# Patient Record
Sex: Male | Born: 1945
Health system: Southern US, Community
[De-identification: ages and names within clinical notes are randomized; demographics above are authoritative.]

## PROBLEM LIST (undated history)

## (undated) ENCOUNTER — Emergency Department (HOSPITAL_COMMUNITY): Payer: Medicare HMO

## (undated) DIAGNOSIS — T7840XA Allergy, unspecified, initial encounter: Secondary | ICD-10-CM

## (undated) DIAGNOSIS — I639 Cerebral infarction, unspecified: Secondary | ICD-10-CM

## (undated) DIAGNOSIS — W19XXXA Unspecified fall, initial encounter: Secondary | ICD-10-CM

## (undated) DIAGNOSIS — I10 Essential (primary) hypertension: Secondary | ICD-10-CM

## (undated) DIAGNOSIS — Z8673 Personal history of transient ischemic attack (TIA), and cerebral infarction without residual deficits: Secondary | ICD-10-CM

## (undated) HISTORY — DX: Allergy, unspecified, initial encounter: T78.40XA

## (undated) HISTORY — PX: OTHER SURGICAL HISTORY: SHX169

## (undated) HISTORY — DX: Cerebral infarction, unspecified: I63.9

---

## 2003-10-02 ENCOUNTER — Other Ambulatory Visit: Payer: Self-pay

## 2006-06-01 ENCOUNTER — Emergency Department: Payer: Self-pay | Admitting: Emergency Medicine

## 2006-06-01 ENCOUNTER — Other Ambulatory Visit: Payer: Self-pay

## 2009-01-17 ENCOUNTER — Emergency Department: Payer: Self-pay

## 2016-10-09 ENCOUNTER — Ambulatory Visit: Payer: Self-pay | Admitting: Urology

## 2016-10-09 NOTE — Progress Notes (Deleted)
10/09/2016 10:13 AM   Joyce CopaWillie Schlee [birth year] [id number]  Referring provider: Domenic Schwabheryl Paulette Lindley, FNP [address] [place], [place] [id number]  No chief complaint on file.   HPI: Patient is a 71 year old *** male who is referred by Domenic Schwabheryl Paulette Lindley, FNP for an elevated PSA.  Patient was found to have a PSA of 11.3 ng/mL on 09/17/2016 during a routine physical exam.    BPH WITH LUTS His IPSS score today is ***, which is *** lower urinary tract symptomatology. He is *** with his quality life due to his urinary symptoms. His PVR is *** mL.  His previous IPSS score was ***.  His previous PVR is *** mL.    His major complaint today ***.  He has had these symptoms for *** years.  He denies any dysuria, hematuria or suprapubic pain.   He currently taking ***.  His has had ***.  Previous PSA's:     He also denies any recent fevers, chills, nausea or vomiting.  He has a family history of PCa, with ***.   He does not have a family history of PCa.***    Score:  1-7 Mild 8-19 Moderate 20-35 Severe   Erectile dysfunction His SHIM score is ***, which is ***.   His previous SHIM score was ***.  He has been having difficulty with erections for ***.   His major complaint is ***.  His libido is ***.   His risk factors for ED are age, pelvic radiation, BPH, prostate cancer, stroke, Parkinson's disease, MS, hypogonadism, spinal injury, brain injury, DM, HTN, HLD, hypothyroidism, sleep apnea, CAD, stress, night shift work, anxiety, depression, alcohol abuse, smoking, antidepressants, pain medication and blood pressure medications. ***  He denies any painful erections or curvatures with his erections.   He has tried *** in the past.        Score: 1-7 Severe ED 8-11 Moderate ED 12-16 Mild-Moderate ED 17-21 Mild ED 22-25 No ED      PMH: No past medical history on file.  Surgical History: No past surgical history on file.  Home Medications:  Allergies as of  10/09/2016   Not on File     Medication List    as of 10/09/2016 10:13 AM   You have not been prescribed any medications.     Allergies: Allergies not on file  Family History: No family history on file.  Social History:  has no tobacco, alcohol, and drug history on file.  ROS:                                        Physical Exam: There were no vitals taken for this visit.  Constitutional: Well nourished. Alert and oriented, No acute distress. HEENT: [place] AT, moist mucus membranes. Trachea midline, no masses. Cardiovascular: No clubbing, cyanosis, or edema. Respiratory: Normal respiratory effort, no increased work of breathing. GI: Abdomen is soft, non tender, non distended, no abdominal masses. Liver and spleen not palpable.  No hernias appreciated.  Stool sample for occult testing is not indicated.   GU: No CVA tenderness.  No bladder fullness or masses.  Patient with circumcised/uncircumcised phallus. ***Foreskin easily retracted***  Urethral meatus is patent.  No penile discharge. No penile lesions or rashes. Scrotum without lesions, cysts, rashes and/or edema.  Testicles are located scrotally bilaterally. No masses are appreciated in the testicles. Left and right  epididymis are normal. Rectal: Patient with  normal sphincter tone. Anus and perineum without scarring or rashes. No rectal masses are appreciated. Prostate is approximately *** grams, *** nodules are appreciated. Seminal vesicles are normal. Skin: No rashes, bruises or suspicious lesions. Lymph: No cervical or inguinal adenopathy. Neurologic: Grossly intact, no focal deficits, moving all 4 extremities. Psychiatric: Normal mood and affect.  Laboratory Data: No results found for: WBC, HGB, HCT, MCV, PLT  No results found for: CREATININE  No results found for: PSA  No results found for: TESTOSTERONE  No results found for: HGBA1C  No results found for: TSH  No results found for: CHOL,  HDL, CHOLHDL, VLDL, LDLCALC  No results found for: AST No results found for: ALT No components found for: ALKALINEPHOPHATASE No components found for: BILIRUBINTOTAL  No results found for: ESTRADIOL   Urinalysis No results found for: COLORURINE, APPEARANCEUR, LABSPEC, PHURINE, GLUCOSEU, HGBUR, BILIRUBINUR, KETONESUR, PROTEINUR, UROBILINOGEN, NITRITE, LEUKOCYTESUR  Pertinent Imaging: ***  Assessment & Plan:  ***  1. Elevated PSA  - I discussed with the patient that PSA is an acronym for  prostate specific antigen,  which is a protein made by the prostate gland and can be detected in the blood stream. I explained to the patient situations that would increase the PSA, such as: a man's age,  BPH, infection, recent intercourse/ejaculation, prostate infarction, recent urethroscopic manipulation (Foley placement/cystoscopy) and prostate cancer.   - At this time, I have advised the patient that we will repeat the PSA to rule out lab error.  If that should return elevated, we could continue observation or pursue a prostate biopsy.   - We discussed that indications for prostate biopsy are defined by age and race specific PSA cutoffs as well as a PSA velocity of 0.75/year.  - We reviewed the implications of an elevated PSA and the uncertainty surrounding it. In general, a man's PSA increases with age and is produced by both normal and cancerous prostate tissue. Differential for elevated PSA is BPH, prostate cancer, infection, recent intercourse/ejaculation, prostate infarction, recent urethroscopic manipulation (foley placement/cystoscopy) and prostatitis. Management of an elevated PSA can include observation or prostate biopsy and we discussed this in detail.  No Follow-up on file.  These notes generated with voice recognition software. I apologize for typographical errors.  Michiel Cowboy, PA-C  [facility] [address] [place], [place] [address] [phone]

## 2017-11-20 ENCOUNTER — Inpatient Hospital Stay (HOSPITAL_COMMUNITY)
Admission: EM | Admit: 2017-11-20 | Discharge: 2017-11-22 | DRG: 069 | Disposition: A | Discharge status: Home Health | Payer: Medicare Other | Attending: Internal Medicine | Admitting: Internal Medicine

## 2017-11-20 ENCOUNTER — Encounter (HOSPITAL_COMMUNITY): Payer: Self-pay | Admitting: *Deleted

## 2017-11-20 ENCOUNTER — Emergency Department (HOSPITAL_COMMUNITY): Payer: Medicare Other

## 2017-11-20 ENCOUNTER — Other Ambulatory Visit: Payer: Self-pay

## 2017-11-20 DIAGNOSIS — E785 Hyperlipidemia, unspecified: Secondary | ICD-10-CM | POA: Diagnosis present

## 2017-11-20 DIAGNOSIS — I6789 Other cerebrovascular disease: Secondary | ICD-10-CM | POA: Diagnosis not present

## 2017-11-20 DIAGNOSIS — R531 Weakness: Secondary | ICD-10-CM | POA: Diagnosis not present

## 2017-11-20 DIAGNOSIS — E162 Hypoglycemia, unspecified: Secondary | ICD-10-CM | POA: Diagnosis present

## 2017-11-20 DIAGNOSIS — I160 Hypertensive urgency: Secondary | ICD-10-CM | POA: Diagnosis not present

## 2017-11-20 DIAGNOSIS — R9431 Abnormal electrocardiogram [ECG] [EKG]: Secondary | ICD-10-CM | POA: Diagnosis present

## 2017-11-20 DIAGNOSIS — F17210 Nicotine dependence, cigarettes, uncomplicated: Secondary | ICD-10-CM | POA: Diagnosis present

## 2017-11-20 DIAGNOSIS — Z8673 Personal history of transient ischemic attack (TIA), and cerebral infarction without residual deficits: Secondary | ICD-10-CM

## 2017-11-20 DIAGNOSIS — R29700 NIHSS score 0: Secondary | ICD-10-CM | POA: Diagnosis present

## 2017-11-20 DIAGNOSIS — Z7982 Long term (current) use of aspirin: Secondary | ICD-10-CM

## 2017-11-20 DIAGNOSIS — Z8249 Family history of ischemic heart disease and other diseases of the circulatory system: Secondary | ICD-10-CM | POA: Diagnosis not present

## 2017-11-20 DIAGNOSIS — I639 Cerebral infarction, unspecified: Secondary | ICD-10-CM

## 2017-11-20 DIAGNOSIS — G459 Transient cerebral ischemic attack, unspecified: Principal | ICD-10-CM

## 2017-11-20 DIAGNOSIS — I10 Essential (primary) hypertension: Secondary | ICD-10-CM | POA: Diagnosis present

## 2017-11-20 DIAGNOSIS — R4182 Altered mental status, unspecified: Secondary | ICD-10-CM | POA: Diagnosis not present

## 2017-11-20 DIAGNOSIS — G8191 Hemiplegia, unspecified affecting right dominant side: Secondary | ICD-10-CM | POA: Diagnosis present

## 2017-11-20 HISTORY — DX: Personal history of transient ischemic attack (TIA), and cerebral infarction without residual deficits: Z86.73

## 2017-11-20 HISTORY — DX: Essential (primary) hypertension: I10

## 2017-11-20 LAB — I-STAT CG4 LACTIC ACID, ED
Lactic Acid, Venous: 1.75 mmol/L (ref 0.5–1.9)
Lactic Acid, Venous: 0.9 mmol/L (ref 0.5–1.9)

## 2017-11-20 LAB — CBC
HCT: 46.8 % (ref 39.0–52.0)
HEMOGLOBIN: 16 g/dL (ref 13.0–17.0)
MCH: 31.1 pg (ref 26.0–34.0)
MCHC: 34.2 g/dL (ref 30.0–36.0)
MCV: 91.1 fL (ref 78.0–100.0)
Platelets: 143 10*3/uL — ABNORMAL LOW (ref 150–400)
RBC: 5.14 MIL/uL (ref 4.22–5.81)
RDW: 14.8 % (ref 11.5–15.5)
WBC: 7.1 10*3/uL (ref 4.0–10.5)

## 2017-11-20 LAB — URINALYSIS, ROUTINE W REFLEX MICROSCOPIC
Bilirubin Urine: NEGATIVE
GLUCOSE, UA: NEGATIVE mg/dL
HGB URINE DIPSTICK: NEGATIVE
Ketones, ur: NEGATIVE mg/dL
LEUKOCYTES UA: NEGATIVE
Nitrite: NEGATIVE
Protein, ur: NEGATIVE mg/dL
SPECIFIC GRAVITY, URINE: 1.013 (ref 1.005–1.030)
pH: 7 (ref 5.0–8.0)

## 2017-11-20 LAB — COMPREHENSIVE METABOLIC PANEL
ALBUMIN: 3.7 g/dL (ref 3.5–5.0)
ALT: 12 U/L — ABNORMAL LOW (ref 17–63)
AST: 17 U/L (ref 15–41)
Alkaline Phosphatase: 86 U/L (ref 38–126)
Anion gap: 12 (ref 5–15)
BUN: 9 mg/dL (ref 6–20)
CHLORIDE: 105 mmol/L (ref 101–111)
CO2: 21 mmol/L — AB (ref 22–32)
Calcium: 8.9 mg/dL (ref 8.9–10.3)
Creatinine, Ser: 1.18 mg/dL (ref 0.61–1.24)
GFR calc Af Amer: >60 mL/min (ref >60)
GFR calc non Af Amer: >60 mL/min (ref >60)
GLUCOSE: 99 mg/dL (ref 65–99)
POTASSIUM: 3.5 mmol/L (ref 3.5–5.1)
SODIUM: 138 mmol/L (ref 135–145)
Total Bilirubin: 0.8 mg/dL (ref 0.3–1.2)
Total Protein: 6.9 g/dL (ref 6.5–8.1)

## 2017-11-20 LAB — CBG MONITORING, ED: Glucose-Capillary: 81 mg/dL (ref 65–99)

## 2017-11-20 LAB — RAPID URINE DRUG SCREEN, HOSP PERFORMED
AMPHETAMINES: NOT DETECTED
BENZODIAZEPINES: NOT DETECTED
Barbiturates: NOT DETECTED
Cocaine: NOT DETECTED
OPIATES: NOT DETECTED
TETRAHYDROCANNABINOL: NOT DETECTED

## 2017-11-20 LAB — I-STAT TROPONIN, ED: TROPONIN I, POC: 0.01 ng/mL (ref 0.00–0.08)

## 2017-11-20 MED ORDER — HYDRALAZINE HCL 20 MG/ML IJ SOLN
10.0000 mg | Freq: Once | INTRAMUSCULAR | Status: AC
  Administered 2017-11-20: 10 mg via INTRAVENOUS
  Filled 2017-11-20: qty 1

## 2017-11-20 NOTE — ED Notes (Signed)
No complaints of pain anywhere

## 2017-11-20 NOTE — ED Provider Notes (Signed)
[facility] Provider Note   CSN: [id number] Arrival date & time: 11/20/17  1920     History   Chief Complaint Chief Complaint  Patient presents with  . Altered Mental Status    HPI Austin Weaver is a 72 y.o. male.  Patient is a 72 year old male presenting with left upper extremity weakness and AMS.  PMH significant for HTN otherwise unremarkable per patient and chart review.  Onset earlier this evening when patient was trying to sit up from chair, he felt weak especially in his left arm and was unable to sit up.  Patient was at his cousin's house who called EMS because he appeared to be weak and having difficulty.  Patient states he did have left arm weakness but denies left leg weakness or facial droop.  Patient moving all 4 extremities by the time EMS arrived without signs of facial droop.  Was noted to have hypoglycemia of 55 and was given glucose with return to 88.  Patient currently on lisinopril-HCTZ but has been not adherent to his medication for the past several months due to diuretic side effect.  He is a never smoker, but denies EtOH use or illicit drugs.  He currently lives at home alone, drives, and does not need assistance with ambulation or ADLs.      Past Medical History:  Diagnosis Date  . Hypertension     Patient Active Problem List   Diagnosis Date Noted  . Hypertensive urgency 11/21/2017  . TIA (transient ischemic attack) 11/20/2017    Past Surgical History:  Procedure Laterality Date  . laporotomy         Home Medications    Prior to Admission medications   Not on File    Family History Family History  Problem Relation Age of Onset  . Hypertension Mother     Social History Social History   Tobacco Use  . Smoking status: Current Every Day Smoker  . Smokeless tobacco: Never Used  Substance Use Topics  . Alcohol use: No    Frequency: Never  . Drug use: Not on file     Allergies   Patient has no  known allergies.   Review of Systems Review of Systems  Constitutional: Negative for appetite change, chills, diaphoresis and fever.  HENT: Negative for congestion, ear pain, rhinorrhea and sore throat.   Eyes: Negative for pain and visual disturbance.  Respiratory: Negative for cough and shortness of breath.   Cardiovascular: Negative for chest pain, palpitations and leg swelling.  Gastrointestinal: Negative for abdominal pain, blood in stool, diarrhea, nausea and vomiting.  Genitourinary: Negative for decreased urine volume, dysuria, frequency and hematuria.  Musculoskeletal: Negative for arthralgias, back pain and gait problem.  Skin: Negative for color change and rash.  Neurological: Positive for weakness. Negative for dizziness, seizures, syncope, facial asymmetry, speech difficulty, light-headedness and headaches.  All other systems reviewed and are negative.    Physical Exam Updated Vital Signs BP (!) 177/99   Pulse 60   Temp (!) 97.5 F (36.4 C) (Oral)   Resp 18   Ht 5\' 6"  (1.676 m)   Wt 55.3 kg (122 lb)   SpO2 98%   BMI 19.69 kg/m   Physical Exam  Constitutional: He is oriented to person, place, and time. He appears well-developed and well-nourished. No distress.  HENT:  Head: Normocephalic and atraumatic.  Dry mucous membranes  Eyes: Conjunctivae and EOM are normal. Pupils are equal, round, and reactive to light.  Neck: Neck supple.  Cardiovascular: Normal rate and regular rhythm. Exam reveals no gallop and no friction rub.  No murmur heard. Pulmonary/Chest: Effort normal and breath sounds normal. No respiratory distress. He has no wheezes. He has no rales.  Abdominal: Soft. Bowel sounds are normal. There is no tenderness.  Musculoskeletal: He exhibits no edema.  5/5 motor strength in grip strength bilaterally  Lymphadenopathy:    He has no cervical adenopathy.  Neurological: He is alert and oriented to person, place, and time. No cranial nerve deficit or  sensory deficit. Coordination normal.  Skin: Skin is warm and dry. He is not diaphoretic.  Psychiatric: He has a normal mood and affect. His behavior is normal. Judgment and thought content normal.  Nursing note and vitals reviewed.    ED Treatments / Results  Labs (all labs ordered are listed, but only abnormal results are displayed) Labs Reviewed  COMPREHENSIVE METABOLIC PANEL - Abnormal; Notable for the following components:      Result Value   CO2 21 (*)    ALT 12 (*)    All other components within normal limits  CBC - Abnormal; Notable for the following components:   Platelets 143 (*)    All other components within normal limits  LIPID PANEL - Abnormal; Notable for the following components:   LDL Cholesterol 125 (*)    All other components within normal limits  CBC - Abnormal; Notable for the following components:   Platelets 143 (*)    All other components within normal limits  URINALYSIS, ROUTINE W REFLEX MICROSCOPIC  RAPID URINE DRUG SCREEN, HOSP PERFORMED  ETHANOL  HEMOGLOBIN A1C  CREATININE, SERUM  TROPONIN I  TROPONIN I  TROPONIN I  CBG MONITORING, ED  I-STAT CG4 LACTIC ACID, ED  I-STAT TROPONIN, ED  I-STAT CG4 LACTIC ACID, ED  CBG MONITORING, ED    EKG  EKG Interpretation  Date/Time:  Friday November 20 2017 19:39:46 EST Ventricular Rate:  75 PR Interval:    QRS Duration: 82 QT Interval:  386 QTC Calculation: 432 R Axis:   27 Text Interpretation:  Sinus rhythm Ventricular premature complex Abnormal T, consider ischemia, diffuse leads Minimal ST elevation, anterior leads No significant change since last tracing Confirmed by Gwyneth Sprout ([id number]) on 11/20/2017 7:43:16 PM       Radiology Ct Angio Head W Or Wo Contrast  Result Date: 11/21/2017 CLINICAL DATA:  LEFT-sided weakness, follow-up stroke. History of hypertension. EXAM: CT ANGIOGRAPHY HEAD AND NECK TECHNIQUE: Multidetector CT imaging of the head and neck was performed using the standard  protocol during bolus administration of intravenous contrast. Multiplanar CT image reconstructions and MIPs were obtained to evaluate the vascular anatomy. Carotid stenosis measurements (when applicable) are obtained utilizing NASCET criteria, using the distal internal carotid diameter as the denominator. CONTRAST:  50mL ISOVUE-370 IOPAMIDOL (ISOVUE-370) INJECTION 76% COMPARISON:  CT HEAD November 20, 2017 FINDINGS: CTA NECK FINDINGS: AORTIC ARCH: Normal appearance of the thoracic arch, 2 vessel arch is a normal variant. Mild calcific atherosclerosis aortic arch. The origins of the innominate, left Common carotid artery and subclavian artery are widely patent. RIGHT CAROTID SYSTEM: Common carotid artery is widely patent, mild calcific atherosclerosis. Trace calcific atherosclerosis carotid bifurcation without hemodynamically significant stenosis by NASCET criteria. Normal appearance of the internal carotid artery. LEFT CAROTID SYSTEM: Common carotid artery is widely patent, tortuous lateral course with moderate calcific atherosclerosis. Trace calcific atherosclerosis carotid bifurcation without hemodynamically significant stenosis by NASCET criteria. Normal appearance of the internal carotid artery. VERTEBRAL  ARTERIES:Left vertebral artery is dominant, multiple folds LEFT V1 segment. Diminutive irregular RIGHT vertebral artery with severe stenosis RIGHT distal V3 segment. SKELETON: No acute osseous process though bone windows have not been submitted. Patient is edentulous. Moderate to severe cervical spondylosis. OTHER NECK: Soft tissues of the neck are nonacute though, not tailored for evaluation. UPPER CHEST: Included lung apices are clear. Mild centrilobular emphysema. No superior mediastinal lymphadenopathy. CTA HEAD FINDINGS: ANTERIOR CIRCULATION: Patent cervical internal carotid arteries, petrous, cavernous and supra clinoid internal carotid arteries. Patent anterior communicating artery. Patent anterior and  middle cerebral arteries. No large vessel occlusion, significant stenosis, contrast extravasation or aneurysm. POSTERIOR CIRCULATION: Occluded RIGHT V4 segment. LEFT vertebral artery is patent with severe stenosis distally due to calcific atherosclerosis. Heavy calcific atherosclerosis basilar artery with long segment severe stenosis, possible occlusion. RIGHT posterior-inferior cerebellar artery not distinctly identified. Bilateral posterior communicating arteries present. Moderate stenosis RIGHT P2 segment. No large vessel occlusion, significant stenosis, contrast extravasation or aneurysm. VENOUS SINUSES: Major dural venous sinuses are patent though not tailored for evaluation on this angiographic examination. ANATOMIC VARIANTS: None. DELAYED PHASE: No abnormal intracranial enhancement. MIP images reviewed. IMPRESSION: CTA NECK: 1. No hemodynamically significant stenosis internal carotid artery's. 2. 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 CTA HEAD: 1. Occluded RIGHT intradural vertebral artery. Severe stenosis LEFT vertebral artery and basilar artery due to heavy calcific atherosclerosis with possible tandem occlusion. 2. Complete circle bullous. No anterior circulation emergent large vessel occlusion or flow limiting stenosis. Aortic Atherosclerosis (ICD10-I70.0) and Emphysema (ICD10-J43.9). Electronically Signed   By: Awilda Metro M.D.   On: 11/21/2017 05:05   Dg Chest 2 View  Result Date: 11/21/2017 CLINICAL DATA:  Stroke. EXAM: CHEST  2 VIEW COMPARISON:  None. FINDINGS: The cardiomediastinal contours are normal. Heart is upper normal in size. Atherosclerosis of thoracic aorta. Coronary artery calcification versus stent. Mild interstitial coarsening. Pulmonary vasculature is normal. No consolidation, pleural effusion, or pneumothorax. No acute osseous abnormalities are seen. IMPRESSION: 1. Borderline cardiomegaly with aortic atherosclerosis.  Coronary artery calcification versus stent. 2. Mild interstitial coarsening, likely chronic, however no prior exams for comparison. Electronically Signed   By: Rubye Oaks M.D.   On: 11/21/2017 02:50   Ct Head Wo Contrast  Result Date: 11/20/2017 CLINICAL DATA:  Hypoglycemia, left-sided weakness and altered mental status. EXAM: CT HEAD WITHOUT CONTRAST TECHNIQUE: Contiguous axial images were obtained from the base of the skull through the vertex without intravenous contrast. COMPARISON:  None. FINDINGS: Brain: No acute hemorrhage. No midline shift or other mass effect. Age-indeterminate infarct of the right external capsule. Advanced atrophy. There is periventricular hypoattenuation compatible with chronic microvascular disease.Asymmetric enlargement of the right hemisphere is likely congenital. Vascular: Extensive calcification of the vertebral and internal carotid arteries at the skull base. Calcification extends to the MCA origins. The right middle cerebral artery is mildly hyperdense, particularly of the M2 segment. Skull: Normal visualized skull base, calvarium and extracranial soft tissues. Sinuses/Orbits: No sinus fluid levels or advanced mucosal thickening. No mastoid effusion. Normal orbits. IMPRESSION: 1. No acute hemorrhage. 2. Age indeterminate right external capsule infarct. Given the volume loss within the right frontal lobe, this is more likely to be chronic. Hyperdense appearance of the right MCA is probably due to atherosclerotic disease, though thrombus could have the same appearance. 3. Chronic ischemic microangiopathy and severe calcific atherosclerosis. Electronically Signed   By: Deatra Robinson M.D.   On: 11/20/2017 22:38   Ct Angio Neck W Or Wo  Contrast  Result Date: 11/21/2017 CLINICAL DATA:  LEFT-sided weakness, follow-up stroke. History of hypertension. EXAM: CT ANGIOGRAPHY HEAD AND NECK TECHNIQUE: Multidetector CT imaging of the head and neck was performed using the standard  protocol during bolus administration of intravenous contrast. Multiplanar CT image reconstructions and MIPs were obtained to evaluate the vascular anatomy. Carotid stenosis measurements (when applicable) are obtained utilizing NASCET criteria, using the distal internal carotid diameter as the denominator. CONTRAST:  50mL ISOVUE-370 IOPAMIDOL (ISOVUE-370) INJECTION 76% COMPARISON:  CT HEAD November 20, 2017 FINDINGS: CTA NECK FINDINGS: AORTIC ARCH: Normal appearance of the thoracic arch, 2 vessel arch is a normal variant. Mild calcific atherosclerosis aortic arch. The origins of the innominate, left Common carotid artery and subclavian artery are widely patent. RIGHT CAROTID SYSTEM: Common carotid artery is widely patent, mild calcific atherosclerosis. Trace calcific atherosclerosis carotid bifurcation without hemodynamically significant stenosis by NASCET criteria. Normal appearance of the internal carotid artery. LEFT CAROTID SYSTEM: Common carotid artery is widely patent, tortuous lateral course with moderate calcific atherosclerosis. Trace calcific atherosclerosis carotid bifurcation without hemodynamically significant stenosis by NASCET criteria. Normal appearance of the internal carotid artery. VERTEBRAL ARTERIES:Left vertebral artery is dominant, multiple folds LEFT V1 segment. Diminutive irregular RIGHT vertebral artery with severe stenosis RIGHT distal V3 segment. SKELETON: No acute osseous process though bone windows have not been submitted. Patient is edentulous. Moderate to severe cervical spondylosis. OTHER NECK: Soft tissues of the neck are nonacute though, not tailored for evaluation. UPPER CHEST: Included lung apices are clear. Mild centrilobular emphysema. No superior mediastinal lymphadenopathy. CTA HEAD FINDINGS: ANTERIOR CIRCULATION: Patent cervical internal carotid arteries, petrous, cavernous and supra clinoid internal carotid arteries. Patent anterior communicating artery. Patent anterior and  middle cerebral arteries. No large vessel occlusion, significant stenosis, contrast extravasation or aneurysm. POSTERIOR CIRCULATION: Occluded RIGHT V4 segment. LEFT vertebral artery is patent with severe stenosis distally due to calcific atherosclerosis. Heavy calcific atherosclerosis basilar artery with long segment severe stenosis, possible occlusion. RIGHT posterior-inferior cerebellar artery not distinctly identified. Bilateral posterior communicating arteries present. Moderate stenosis RIGHT P2 segment. No large vessel occlusion, significant stenosis, contrast extravasation or aneurysm. VENOUS SINUSES: Major dural venous sinuses are patent though not tailored for evaluation on this angiographic examination. ANATOMIC VARIANTS: None. DELAYED PHASE: No abnormal intracranial enhancement. MIP images reviewed. IMPRESSION: CTA NECK: 1. No hemodynamically significant stenosis internal carotid artery's. 2. 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 CTA HEAD: 1. Occluded RIGHT intradural vertebral artery. Severe stenosis LEFT vertebral artery and basilar artery due to heavy calcific atherosclerosis with possible tandem occlusion. 2. Complete circle bullous. No anterior circulation emergent large vessel occlusion or flow limiting stenosis. Aortic Atherosclerosis (ICD10-I70.0) and Emphysema (ICD10-J43.9). Electronically Signed   By: Awilda Metro M.D.   On: 11/21/2017 05:05    Procedures Procedures (including critical care time)  Medications Ordered in ED Medications   stroke: mapping our early stages of recovery book (not administered)  0.9 %  sodium chloride infusion ( Intravenous New Bag/Given 11/21/17 0529)  acetaminophen (TYLENOL) tablet 650 mg (not administered)    Or  acetaminophen (TYLENOL) solution 650 mg (not administered)    Or  acetaminophen (TYLENOL) suppository 650 mg (not administered)  enoxaparin (LOVENOX) injection 40 mg (not  administered)  aspirin suppository 300 mg (not administered)    Or  aspirin tablet 325 mg (not administered)  hydrALAZINE (APRESOLINE) injection 10 mg (not administered)  lisinopril (PRINIVIL,ZESTRIL) tablet 20 mg (not administered)  atorvastatin (LIPITOR) tablet 80 mg (80 mg  Oral Given 11/21/17 0641)  hydrALAZINE (APRESOLINE) injection 10 mg (10 mg Intravenous Given 11/20/17 2321)  iopamidol (ISOVUE-370) 76 % injection (50 mLs  Contrast Given 11/21/17 0245)     Initial Impression / Assessment and Plan / ED Course  I have reviewed the triage vital signs and the nursing notes.  Pertinent labs & imaging results that were available during my care of the patient were reviewed by me and considered in my medical decision making (see chart for details).  Patient is a 72 year old male presenting with left upper extremity weakness and AMS.  PMH significant for HTN otherwise unremarkable per patient and chart review.  Vitals afebrile, HR 72 and hypertensive 205/104, RR 16 at 99% room air.  EKG NSR without new T wave changes or ST changes compared to prior EKG. patient noted to have hypoglycemia 55.  EMS and return to 81 upon arrival following 1 amp of dextrose.  Patient states he skipped lunch today because he was busy usually has 3 meals a day.  He did have breakfast including eggs, toast, improved.  Patient's neighbor and cousin arriving to room to witness the event.  State patient had signs of left-sided facial droop.  Patient also recalling difficulty using the left lower extremity.  This was an issue in the past which spontaneously resolved over the last couple weeks.  We will proceed with head CT without contrast.  CT head without contrast without acute hemorrhage the findings consistent with indeterminate right external capsule infarct likely to be chronic with hyperdense appearance of right MCA likely due to arthrosclerotic disease and chronic ischemic microangiopathic severe calcific  atherosclerosis.  Given recent symptoms concerning for TIA and history of ischemic stroke of undetermined age, will admit for stroke workup.  Final Clinical Impressions(s) / ED Diagnoses   Final diagnoses:  Weakness  TIA (transient ischemic attack)    ED Discharge Orders    None       Wendee Beavers, DO 11/21/17 [id number]    Gwyneth Sprout, MD 11/21/17 1556

## 2017-11-20 NOTE — ED Notes (Signed)
Patient denies pain and is resting comfortably.  

## 2017-11-20 NOTE — ED Notes (Signed)
The pt has not had his bp med for months

## 2017-11-20 NOTE — ED Provider Notes (Deleted)
I saw and evaluated the patient, reviewed the resident's note and I agree with the findings and plan.   EKG Interpretation  Date/Time:  Friday November 20 2017 19:39:46 EST Ventricular Rate:  75 PR Interval:    QRS Duration: 82 QT Interval:  386 QTC Calculation: 432 R Axis:   27 Text Interpretation:  Sinus rhythm Ventricular premature complex Abnormal T, consider ischemia, diffuse leads Minimal ST elevation, anterior leads No significant change since last tracing Confirmed by Gwyneth SproutPlunkett, Rocsi Hazelbaker ([id number]) on 11/20/2017 7:43:16 PM      TIA (transient ischemic attack)  Weakness  Patient is a 72 year old male with a history of hypertension and poor compliance with his blood pressure medications presenting today with left-sided deficits that have resolved upon arrival.  Patient states he was at his cousin's house when he tried to stand up in his left leg and arm would not work.  Also family present at the scene states his face was twisted.  Patient thinks his symptoms lasted between 25 and 30 minutes and then he was able to get up and walk but his arm weakness lingered.  Upon arrival to the hospital his symptoms have resolved.  He denies headache, abdominal pain, nausea or vomiting.  Patient had not eaten lunch and when EMS arrived blood sugar was 54 however it is remained in the 80s and he does not have a history of diabetes.  Patient denies drug or alcohol use.  On exam patient has 5 out of 5 strength in bilateral upper extremities, no facial droop, lower extremities with 5 out of 5 strength.  Speech without evidence of aphasia.  Heart is regular rate and rhythm and lungs are clear to auscultation bilaterally.  Patient CT is consistent with age-indeterminate right external capsular infarct and hyperdense appearance of the right MCA is probably due to atherosclerotic disease.  Patient has been persistently hypertensive here however with TIA or possible stroke will admit for further workup.   Gwyneth SproutPlunkett,  Argus Caraher, MD 11/21/17 [phone]

## 2017-11-20 NOTE — ED Triage Notes (Signed)
The pt arrived by gems from home  Family called oout because the pt  Had lt sided wweakness  cbg was checked and was found to have a glucose  Of 56  He was given oral glucose and his cbg went up to 88.  On arrival the pt is alertt and oriented skin warm and d ry moves all 4 wxtremities and no facial droop

## 2017-11-21 ENCOUNTER — Observation Stay (HOSPITAL_COMMUNITY): Payer: Medicare Other

## 2017-11-21 ENCOUNTER — Encounter (HOSPITAL_COMMUNITY): Payer: Self-pay | Admitting: Internal Medicine

## 2017-11-21 DIAGNOSIS — G459 Transient cerebral ischemic attack, unspecified: Secondary | ICD-10-CM | POA: Diagnosis not present

## 2017-11-21 DIAGNOSIS — I639 Cerebral infarction, unspecified: Secondary | ICD-10-CM

## 2017-11-21 DIAGNOSIS — E162 Hypoglycemia, unspecified: Secondary | ICD-10-CM | POA: Diagnosis present

## 2017-11-21 DIAGNOSIS — E785 Hyperlipidemia, unspecified: Secondary | ICD-10-CM | POA: Diagnosis present

## 2017-11-21 DIAGNOSIS — R29700 NIHSS score 0: Secondary | ICD-10-CM | POA: Diagnosis present

## 2017-11-21 DIAGNOSIS — I160 Hypertensive urgency: Secondary | ICD-10-CM | POA: Diagnosis present

## 2017-11-21 DIAGNOSIS — G8191 Hemiplegia, unspecified affecting right dominant side: Secondary | ICD-10-CM | POA: Diagnosis present

## 2017-11-21 DIAGNOSIS — F17210 Nicotine dependence, cigarettes, uncomplicated: Secondary | ICD-10-CM | POA: Diagnosis present

## 2017-11-21 DIAGNOSIS — I10 Essential (primary) hypertension: Secondary | ICD-10-CM | POA: Diagnosis present

## 2017-11-21 DIAGNOSIS — R531 Weakness: Secondary | ICD-10-CM | POA: Diagnosis not present

## 2017-11-21 DIAGNOSIS — Z7982 Long term (current) use of aspirin: Secondary | ICD-10-CM | POA: Diagnosis not present

## 2017-11-21 DIAGNOSIS — Z8249 Family history of ischemic heart disease and other diseases of the circulatory system: Secondary | ICD-10-CM | POA: Diagnosis not present

## 2017-11-21 DIAGNOSIS — R9431 Abnormal electrocardiogram [ECG] [EKG]: Secondary | ICD-10-CM | POA: Diagnosis present

## 2017-11-21 LAB — HEMOGLOBIN A1C
Hgb A1c MFr Bld: 5.6 % (ref 4.8–5.6)
Mean Plasma Glucose: 114.02 mg/dL

## 2017-11-21 LAB — CBC
HCT: 48.6 % (ref 39.0–52.0)
Hemoglobin: 16.8 g/dL (ref 13.0–17.0)
MCH: 31.4 pg (ref 26.0–34.0)
MCHC: 34.6 g/dL (ref 30.0–36.0)
MCV: 90.8 fL (ref 78.0–100.0)
PLATELETS: 143 10*3/uL — AB (ref 150–400)
RBC: 5.35 MIL/uL (ref 4.22–5.81)
RDW: 14.8 % (ref 11.5–15.5)
WBC: 7.9 10*3/uL (ref 4.0–10.5)

## 2017-11-21 LAB — TROPONIN I
Troponin I: <0.03 ng/mL (ref <0.03)
Troponin I: <0.03 ng/mL (ref <0.03)

## 2017-11-21 LAB — CREATININE, SERUM
CREATININE: 1.05 mg/dL (ref 0.61–1.24)
GFR calc Af Amer: >60 mL/min (ref >60)
GFR calc non Af Amer: >60 mL/min (ref >60)

## 2017-11-21 LAB — LIPID PANEL
CHOL/HDL RATIO: 3.5 ratio
CHOLESTEROL: 194 mg/dL (ref 0–200)
HDL: 55 mg/dL (ref >40)
LDL CALC: 125 mg/dL — AB (ref 0–99)
TRIGLYCERIDES: 69 mg/dL (ref <150)
VLDL: 14 mg/dL (ref 0–40)

## 2017-11-21 LAB — ETHANOL

## 2017-11-21 LAB — CBG MONITORING, ED: Glucose-Capillary: 94 mg/dL (ref 65–99)

## 2017-11-21 MED ORDER — IOPAMIDOL (ISOVUE-370) INJECTION 76%
INTRAVENOUS | Status: AC
  Administered 2017-11-21: 50 mL
  Filled 2017-11-21: qty 50

## 2017-11-21 MED ORDER — ACETAMINOPHEN 325 MG PO TABS: 650.0000 mg | ORAL_TABLET | ORAL | Status: DC | PRN

## 2017-11-21 MED ORDER — STROKE: EARLY STAGES OF RECOVERY BOOK
Freq: Once | Status: AC
  Administered 2017-11-21: 11:00:00
  Filled 2017-11-21 (×2): qty 1

## 2017-11-21 MED ORDER — ACETAMINOPHEN 160 MG/5ML PO SOLN: 650.0000 mg | ORAL | Status: DC | PRN

## 2017-11-21 MED ORDER — ATORVASTATIN CALCIUM 80 MG PO TABS
80.0000 mg | ORAL_TABLET | Freq: Every day | ORAL | Status: DC
  Administered 2017-11-21: 80 mg via ORAL
  Filled 2017-11-21: qty 1

## 2017-11-21 MED ORDER — ASPIRIN 325 MG PO TABS
325.0000 mg | ORAL_TABLET | Freq: Every day | ORAL | Status: DC
  Administered 2017-11-21 – 2017-11-22 (×2): 325 mg via ORAL
  Filled 2017-11-21 (×2): qty 1

## 2017-11-21 MED ORDER — ASPIRIN 300 MG RE SUPP: 300.0000 mg | Freq: Every day | RECTAL | Status: DC

## 2017-11-21 MED ORDER — LISINOPRIL 10 MG PO TABS
20.0000 mg | ORAL_TABLET | Freq: Every day | ORAL | Status: DC
  Administered 2017-11-21 – 2017-11-22 (×2): 20 mg via ORAL
  Filled 2017-11-21 (×2): qty 2

## 2017-11-21 MED ORDER — SODIUM CHLORIDE 0.9 % IV SOLN
INTRAVENOUS | Status: DC
  Administered 2017-11-21: 05:00:00 via INTRAVENOUS

## 2017-11-21 MED ORDER — ACETAMINOPHEN 650 MG RE SUPP: 650.0000 mg | RECTAL | Status: DC | PRN

## 2017-11-21 MED ORDER — HYDRALAZINE HCL 20 MG/ML IJ SOLN: 10.0000 mg | INTRAMUSCULAR | Status: DC | PRN

## 2017-11-21 MED ORDER — ENOXAPARIN SODIUM 40 MG/0.4ML ~~LOC~~ SOLN
40.0000 mg | SUBCUTANEOUS | Status: DC
  Administered 2017-11-21 – 2017-11-22 (×2): 40 mg via SUBCUTANEOUS
  Filled 2017-11-21 (×3): qty 0.4

## 2017-11-21 NOTE — ED Notes (Signed)
hospitalist at the bedside 

## 2017-11-21 NOTE — Progress Notes (Signed)
[facility] PLAN OF CARE NOTE Patient: Austin CopaWillie Carbone ZOX:[id number]   PCP: Patient, No Pcp Per DOB: December 13, 1945   DOA: 11/20/2017   DOS: 11/21/2017    Patient was admitted by my colleague Dr. Toniann FailKakrakandy earlier on 11/21/2017. I have reviewed the H&P as well as assessment and plan and agree with the same. Important changes in the plan are listed below.  Plan of care: Active Problems:   TIA (transient ischemic attack)   Hypertensive urgency    Author: Lynden OxfordPranav Lycan Davee, MD [facility] Pager: [phone] 11/21/2017 1:10 PM   If 7PM-7AM, please contact night-coverage at [email or web address], password [email or web address]

## 2017-11-21 NOTE — ED Notes (Signed)
No distress.

## 2017-11-21 NOTE — Consult Note (Signed)
Requesting Physician: Dr. Anitra LauthPlunkett    Chief Complaint: Transient R side weakness  History obtained from: Patient and Chart   HPI:                                                                                                                                       Austin Weaver is an 72 y.o. male the past medical history of hypertension,smoker  presents to the ER after having difficulty getting up due to right-sided weakness that lasted for 30 minutes.  She states that he was watching TV around 5 PM he noticed that he could not get up. He states his right-side ( intially stated left side)  was weak and then after about 30 minutes to get up however still felt weaker on that side. EMS arrived and patient noted to be hypoglycemic to 55 and moved to 81 after 1 amp of dextrose. His blood pressure was also noted to be 205/104. Symptoms are completely resolved by the time he arrived to the emergency room.  Date last known well: 2.15.19 Time last known well: 5pm tPA Given: resolved symptoms NIHSS: 0 Baseline MRS 0   Past Medical History:  Diagnosis Date  . Hypertension     Past Surgical History:  Procedure Laterality Date  . laporotomy      Family History  Problem Relation Age of Onset  . Hypertension Mother    Social History:  reports that he has been smoking.  he has never used smokeless tobacco. He reports that he does not drink alcohol. His drug history is not on file.  Allergies: No Known Allergies  Medications:                                                                                                                        I reviewed home medications   ROS:  14 systems reviewed and negative except above    Examination:                                                                                                      General: Appears  well-developed and well-nourished.  Psych: Affect appropriate to situation Eyes: No scleral injection HENT: No OP obstrucion Head: Normocephalic.  Cardiovascular: Normal rate and regular rhythm.  Respiratory: Effort normal and breath sounds normal to anterior ascultation GI: Soft.  No distension. There is no tenderness.  Skin: WDI   Neurological Examination Mental Status: Alert, oriented, thought content appropriate.  Speech: Stuttering speech. Able to follow 3 step commands without difficulty. Cranial Nerves: II: Visual fields grossly normal,  III,IV, VI: ptosis not present, extra-ocular motions intact bilaterally, pupils equal, round, reactive to light and accommodation V,VII: smile symmetric, facial light touch sensation normal bilaterally VIII: hearing normal bilaterally IX,X: uvula rises symmetrically XI: bilateral shoulder shrug XII: midline tongue extension Motor: Right : Upper extremity   5/5    Left:     Upper extremity   5/5  Lower extremity   5/5     Lower extremity   5/5 Tone and bulk:normal tone throughout; no atrophy noted Sensory: Pinprick and light touch intact throughout, bilaterally Deep Tendon Reflexes: 2+ and symmetric throughout Plantars: Right: downgoing   Left: downgoing Cerebellar: normal finger-to-nose, normal rapid alternating movements and normal heel-to-shin test Gait: normal gait and station     Lab Results: Basic Metabolic Panel: Recent Labs  Lab 11/20/17 1948  NA 138  K 3.5  CL 105  CO2 21*  GLUCOSE 99  BUN 9  CREATININE 1.18  CALCIUM 8.9    CBC: Recent Labs  Lab 11/20/17 1948  WBC 7.1  HGB 16.0  HCT 46.8  MCV 91.1  PLT 143*    Coagulation Studies: No results for input(s): LABPROT, INR in the last 72 hours.  Imaging: Dg Chest 2 View  Result Date: 11/21/2017 CLINICAL DATA:  Stroke. EXAM: CHEST  2 VIEW COMPARISON:  None. FINDINGS: The cardiomediastinal contours are normal. Heart is upper normal in size. Atherosclerosis  of thoracic aorta. Coronary artery calcification versus stent. Mild interstitial coarsening. Pulmonary vasculature is normal. No consolidation, pleural effusion, or pneumothorax. No acute osseous abnormalities are seen. IMPRESSION: 1. Borderline cardiomegaly with aortic atherosclerosis. Coronary artery calcification versus stent. 2. Mild interstitial coarsening, likely chronic, however no prior exams for comparison. Electronically Signed   By: Rubye Oaks M.D.   On: 11/21/2017 02:50   Ct Head Wo Contrast  Result Date: 11/20/2017 CLINICAL DATA:  Hypoglycemia, left-sided weakness and altered mental status. EXAM: CT HEAD WITHOUT CONTRAST TECHNIQUE: Contiguous axial images were obtained from the base of the skull through the vertex without intravenous contrast. COMPARISON:  None. FINDINGS: Brain: No acute hemorrhage. No midline shift or other mass effect. Age-indeterminate infarct of the right external capsule. Advanced atrophy. There is periventricular hypoattenuation compatible with chronic microvascular disease.Asymmetric enlargement of the right hemisphere is likely congenital. Vascular: Extensive calcification of the vertebral and internal carotid arteries at the skull base. Calcification extends  to the MCA origins. The right middle cerebral artery is mildly hyperdense, particularly of the M2 segment. Skull: Normal visualized skull base, calvarium and extracranial soft tissues. Sinuses/Orbits: No sinus fluid levels or advanced mucosal thickening. No mastoid effusion. Normal orbits. IMPRESSION: 1. No acute hemorrhage. 2. Age indeterminate right external capsule infarct. Given the volume loss within the right frontal lobe, this is more likely to be chronic. Hyperdense appearance of the right MCA is probably due to atherosclerotic disease, though thrombus could have the same appearance. 3. Chronic ischemic microangiopathy and severe calcific atherosclerosis. Electronically Signed   By: Deatra Robinson M.D.    On: 11/20/2017 22:38     ASSESSMENT AND PLAN  72 y.o. male the past medical history of hypertension and tobacco abuse presents to the ER after having difficulty getting up due to right-sided weakness that lasted for 30 minutes. Symptoms resolved by the time he arrived to the emergency room. EMS noticed patient to be hypoglycemic as well as severely hypertensive. CT head was negative for any acute infarct, CT angiogram showed near intracranial atherosclerosis immediately posterior circulation as well as Occluded RIGHT V4 segment. LEFT vertebralartery is patent with severe stenosis distally due to calcific atherosclerosis.  Transient ischemic attack/Possible AIS Intracranial atherosclerotic disease Occluded right vertebral artery     Recommend # MRI of the brain without contrast #MRA Head and neck  #Transthoracic Echo  # Start patient on ASA 325mg  daily, consider starting patient on DAPT x84months for ICAD ( final decision by stroke team)  #Start or continue Atorvastatin 80 mg/other high intensity statin  # BP goal: permissive HTN upto 220 systolic, PRNs above 220 systolic  # HBAIC and Lipid profile # Telemetry monitoring # Frequent neuro checks # NPO until passes stroke swallow screen  Please page stroke NP  Or  PA  Or MD from 8am -4 pm  as this patient from this time will be  followed by the stroke.   You can look them up on [email or web address]  Password [facility]    Sushanth Aroor [facility] Pager Number [phone]
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
## 2017-11-21 NOTE — ED Notes (Signed)
Pt brought back from c-t  Reporting that the iv rt a-c had infiltrated  iov  Removed  Iv team notified

## 2017-11-21 NOTE — Progress Notes (Signed)
SLP Cancellation Note  Patient Details Name: Austin Weaver MRN: [id number] DOB: 03/20/1946   Cancelled treatment:       Reason Eval/Treat Not Completed: SLP screened, no needs identified, will sign off. Per charting, pt passed RN stroke swallow screen 11/20/17 at 11:09 PM, therefore SLP swallow evaluation not warranted unless concerns for dysphagia. D/w RN; diet orders per MD.   Rondel BatonMary Beth Sebastion Jun, MS, CCC-SLP Speech-Language Pathologist [phone]  Arlana LindauMary E Ikia Cincotta 11/21/2017, 1:37 PM

## 2017-11-21 NOTE — ED Notes (Signed)
Pt back to ct

## 2017-11-21 NOTE — ED Notes (Signed)
He cannot remember the year

## 2017-11-21 NOTE — ED Notes (Signed)
Report given to sharon on 6e

## 2017-11-21 NOTE — Evaluation (Signed)
Physical Therapy Evaluation Patient Details Name: Austin Weaver MRN: [id number] DOB: Feb 28, 1946 Today's Date: 11/21/2017   History of Present Illness  Pt is a 72 y/o male admitted secondary to acute onset of R sided weakness that lasted ~30 minutes. Head CT was negative for any acute findings. PMH including but not limited to HTN.    Clinical Impression  Pt presented supine in bed with HOB elevated, awake and willing to participate in therapy session. Prior to admission, pt reported that he was independent with all functional mobility and ADLs. Pt reported that he continues to drive. Pt lives alone but stated that he was planning to stay with his cousin for a few days upon d/c. Pt currently requires min guard for transfers and min guard for ambulation without use of an AD. Pt would continue to benefit from skilled physical therapy services at this time while admitted and after d/c to address the below listed limitations in order to improve overall safety and independence with functional mobility.  BP supine at beginning of session = 172/91 BP sitting = 180/109 BP supine at end of session = 187/91    Follow Up Recommendations Home health PT;Supervision/Assistance - 24 hour    Equipment Recommendations  None recommended by PT    Recommendations for Other Services       Precautions / Restrictions Precautions Precautions: Fall Restrictions Weight Bearing Restrictions: No      Mobility  Bed Mobility Overal bed mobility: Modified Independent             General bed mobility comments: increased time and effort  Transfers Overall transfer level: Needs assistance Equipment used: None Transfers: Sit to/from Stand Sit to Stand: Min guard         General transfer comment: with first sit<>stand, pt with LOB posteriorly and unable to correct and had to sit back down on bed. second attempt was successful, no physical assistance needed, min guard for  safety  Ambulation/Gait Ambulation/Gait assistance: Min guard Ambulation Distance (Feet): 50 Feet Assistive device: None Gait Pattern/deviations: Step-through pattern;Decreased step length - right;Decreased step length - left;Decreased stride length;Narrow base of support;Drifts right/left Gait velocity: decreased Gait velocity interpretation: Below normal speed for age/gender General Gait Details: mild instability with one minor LOB laterally that he was able to self-correct  Stairs            Wheelchair Mobility    Modified Rankin (Stroke Patients Only)       Balance Overall balance assessment: Needs assistance Sitting-balance support: Feet supported Sitting balance-Leahy Scale: Good     Standing balance support: No upper extremity supported;During functional activity Standing balance-Leahy Scale: Fair                               Pertinent Vitals/Pain Pain Assessment: No/denies pain    Home Living Family/patient expects to be discharged to:: Private residence Living Arrangements: Alone Available Help at Discharge: Family;Friend(s);Available PRN/intermittently   Home Access: Stairs to enter   Entrance Stairs-Number of Steps: 1 Home Layout: One level Home Equipment: Cane - single point;Walker - 2 wheels Additional Comments: Pt plans to go stay with his cousin (who can provide 24/7 supervision/assistance) for "a few days" after d/c    Prior Function Level of Independence: Independent               Hand Dominance        Extremity/Trunk Assessment   Upper Extremity Assessment Upper  Extremity Assessment: Defer to OT evaluation    Lower Extremity Assessment Lower Extremity Assessment: Overall WFL for tasks assessed(did not perform MMT secondary to high BP (180's systolic))    Cervical / Trunk Assessment Cervical / Trunk Assessment: Normal  Communication   Communication: Other (comment)(pt with intermittent stutter)  Cognition  Arousal/Alertness: Awake/alert Behavior During Therapy: WFL for tasks assessed/performed Overall Cognitive Status: No family/caregiver present to determine baseline cognitive functioning Area of Impairment: Orientation;Memory;Following commands;Awareness;Problem solving                 Orientation Level: Disoriented to;Time   Memory: Decreased short-term memory Following Commands: Follows one step commands consistently;Follows one step commands with increased time   Awareness: Intellectual Problem Solving: Difficulty sequencing;Requires verbal cues        General Comments      Exercises     Assessment/Plan    PT Assessment Patient needs continued PT services  PT Problem List Decreased balance;Decreased mobility;Decreased coordination;Decreased cognition;Decreased safety awareness       PT Treatment Interventions DME instruction;Gait training;Functional mobility training;Therapeutic exercise;Stair training;Therapeutic activities;Balance training;Neuromuscular re-education;Patient/family education    PT Goals (Current goals can be found in the Care Plan section)  Acute Rehab PT Goals Patient Stated Goal: to eat PT Goal Formulation: With patient Time For Goal Achievement: 12/05/17 Potential to Achieve Goals: Good    Frequency Min 3X/week   Barriers to discharge Decreased caregiver support      Co-evaluation               AM-PAC PT "6 Clicks" Daily Activity  Outcome Measure Difficulty turning over in bed (including adjusting bedclothes, sheets and blankets)?: None Difficulty moving from lying on back to sitting on the side of the bed? : A Little Difficulty sitting down on and standing up from a chair with arms (e.g., wheelchair, bedside commode, etc,.)?: Unable Help needed moving to and from a bed to chair (including a wheelchair)?: A Little Help needed walking in hospital room?: A Little Help needed climbing 3-5 steps with a railing? : A Little 6 Click  Score: 17    End of Session   Activity Tolerance: Patient tolerated treatment well;Other (comment) Patient left: in bed;with call bell/phone within reach Nurse Communication: Mobility status;Other (comment)(BP high throughout (low 180's for systolic)) PT Visit Diagnosis: Other abnormalities of gait and mobility (R26.89);Difficulty in walking, not elsewhere classified (R26.2)    Time: 1610-9604 PT Time Calculation (min) (ACUTE ONLY): 23 min   Charges:   PT Evaluation $PT Eval Moderate Complexity: 1 Mod PT Treatments $Therapeutic Activity: 8-22 mins   PT G Codes:       [place] [facility], PT, DPT [phone]   Austin Weaver Austin Weaver 11/21/2017, 1:10 PM

## 2017-11-22 ENCOUNTER — Inpatient Hospital Stay (HOSPITAL_COMMUNITY): Payer: Medicare Other

## 2017-11-22 DIAGNOSIS — I160 Hypertensive urgency: Secondary | ICD-10-CM

## 2017-11-22 DIAGNOSIS — G459 Transient cerebral ischemic attack, unspecified: Principal | ICD-10-CM

## 2017-11-22 LAB — ECHOCARDIOGRAM COMPLETE
Height: 66 in
WEIGHTICAEL: 1952 [oz_av]

## 2017-11-22 MED ORDER — ATORVASTATIN CALCIUM 80 MG PO TABS: 80.0000 mg | ORAL_TABLET | Freq: Every day | ORAL | 0 refills | Status: DC

## 2017-11-22 MED ORDER — AMLODIPINE BESYLATE 5 MG PO TABS: 5.0000 mg | ORAL_TABLET | Freq: Every day | ORAL | 11 refills | Status: DC

## 2017-11-22 MED ORDER — ASPIRIN 325 MG PO TABS: 325.0000 mg | ORAL_TABLET | Freq: Every day | ORAL | 0 refills | Status: DC

## 2017-11-22 NOTE — Care Management Note (Addendum)
Case Management Note Donn PieriniKristi Lamiracle Chaidez RN, BSN Unit 4E-Case Manager [phone]  Patient Details  Name: Austin Weaver MRN: [id number] Date of Birth: 08/20/1946  Subjective/Objective:   Pt admitted with TIA                 Action/Plan: PTA pt lived at home with cousin, independent, still drives, [facility]- spoke with pt at bedside- pt is agreeable- choice offered - pt does not have a preference and [facility]- per pt contact # in epic correct however address is not he [place] [address] but does not remember house # presently. Will try to f/u prior to discharge when family present.  [facility] spoke with family member Austin Weaver- contact # [phone], confirmed address has [address], [place] [address]- she reports that pt's PCP now longer living- and they [facility]- alternate contact is Austin Weaver- [phone]- [phone]  Expected Discharge Date:  11/22/17               Expected Discharge Plan:  Home w Home Health Services  In-House Referral:     Discharge planning Services  CM Consult  Post Acute Care Choice:  Home Health Choice offered to:  Patient  DME Arranged:  N/A DME Agency:  NA  HH Arranged:  PT/OT HH Agency:  [facility]  Status of Service:  Completed, signed off  If discussed at Long Length of Stay Meetings, dates discussed:   Discharge Disposition: home/home health    Additional Comments:  Darrold SpanWebster, Allien Melberg Hall, RN 11/22/2017, 12:07 PM

## 2017-11-22 NOTE — Progress Notes (Signed)
STROKE TEAM PROGRESS NOTE   HISTORY OF PRESENT ILLNESS (per record) Austin Weaver is an 72 y.o. male the past medical history of hypertension,smoker  presents to the ER after having difficulty getting up due to right-sided weakness that lasted for 30 minutes.  She states that he was watching TV around 5 PM he noticed that he could not get up. He states his right-side ( intially stated left side)  was weak and then after about 30 minutes to get up however still felt weaker on that side. EMS arrived and patient noted to be hypoglycemic to 55 and moved to 81 after 1 amp of dextrose. His blood pressure was also noted to be 205/104. Symptoms are completely resolved by the time he arrived to the emergency room.  Date last known well: 2.15.19 Time last known well: 5pm tPA Given: resolved symptoms NIHSS: 0 Baseline MRS 0   SUBJECTIVE (INTERVAL HISTORY) His family is not at the bedside. He feels well.     OBJECTIVE Temp:  [97.8 F (36.6 C)-98.5 F (36.9 C)] 98.2 F (36.8 C) (02/17 0400) Pulse Rate:  [25-64] 58 (02/17 0800) Cardiac Rhythm: Normal sinus rhythm (02/17 0816) Resp:  [14-18] 16 (02/17 0800) BP: (129-195)/(70-101) 172/96 (02/17 0800) SpO2:  [96 %-100 %] 96 % (02/17 0800)  CBC:  Recent Labs  Lab 11/20/17 1948 11/21/17 0448  WBC 7.1 7.9  HGB 16.0 16.8  HCT 46.8 48.6  MCV 91.1 90.8  PLT 143* 143*    Basic Metabolic Panel:  Recent Labs  Lab 11/20/17 1948 11/21/17 0448  NA 138  --   K 3.5  --   CL 105  --   CO2 21*  --   GLUCOSE 99  --   BUN 9  --   CREATININE 1.18 1.05  CALCIUM 8.9  --     Lipid Panel:     Component Value Date/Time   CHOL 194 11/21/2017 0449   TRIG 69 11/21/2017 0449   HDL 55 11/21/2017 0449   CHOLHDL 3.5 11/21/2017 0449   VLDL 14 11/21/2017 0449   LDLCALC 125 (H) 11/21/2017 0449   HgbA1c:  Lab Results  Component Value Date   HGBA1C 5.6 11/21/2017   Urine Drug Screen:     Component Value Date/Time   LABOPIA NONE DETECTED  11/20/2017 1939   COCAINSCRNUR NONE DETECTED 11/20/2017 1939   LABBENZ NONE DETECTED 11/20/2017 1939   AMPHETMU NONE DETECTED 11/20/2017 1939   THCU NONE DETECTED 11/20/2017 1939   LABBARB NONE DETECTED 11/20/2017 1939    Alcohol Level     Component Value Date/Time   ETH <10 11/20/2017 2340    IMAGING   Ct Angio Head W Or Wo Contrast 11/21/2017 IMPRESSION:   CTA NECK:  1. No hemodynamically significant stenosis internal carotid artery's.  2. 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   CTA HEAD:  1. Occluded RIGHT intradural vertebral artery. Severe stenosis LEFT vertebral artery and basilar artery due to heavy calcific atherosclerosis with possible tandem occlusion.  2. Complete circle bullous. No anterior circulation emergent large vessel occlusion or flow limiting stenosis. Aortic Atherosclerosis (ICD10-I70.0) and Emphysema (ICD10-J43.9).      Dg Chest 2 View 11/21/2017 IMPRESSION:  1. Borderline cardiomegaly with aortic atherosclerosis. Coronary artery calcification versus stent.  2. Mild interstitial coarsening, likely chronic, however no prior exams for comparison.     Ct Head Wo Contrast 11/20/2017 IMPRESSION:  1. No acute hemorrhage.  2. Age indeterminate right external  capsule infarct. Given the volume loss within the right frontal lobe, this is more likely to be chronic. Hyperdense appearance of the right MCA is probably due to atherosclerotic disease, though thrombus could have the same appearance.  3. Chronic ischemic microangiopathy and severe calcific atherosclerosis.      Transthoracic Echocardiogram - pending 00/00/00       PHYSICAL EXAM Vitals:   11/22/17 0200 11/22/17 0400 11/22/17 0600 11/22/17 0800  BP: 129/74 (!) 143/79 (!) 184/101 (!) 172/96  Pulse: (!) 58 63 62 (!) 58  Resp: 16 14 16 16   Temp:  98.2 F (36.8 C)    TempSrc:  Oral    SpO2: 97% 97% 96% 96%  Weight:      Height:             HOME MEDICATIONS:  No medications prior to admission.      HOSPITAL MEDICATIONS:  . aspirin  300 mg Rectal Daily   Or  . aspirin  325 mg Oral Daily  . atorvastatin  80 mg Oral q1800  . enoxaparin (LOVENOX) injection  40 mg Subcutaneous Q24H  . lisinopril  20 mg Oral Daily   PHYSICAL EXAM Physical exam: Exam: Gen: NAD Eyes: anicteric sclerae, moist conjunctivae                    CV: no MRG, no carotid bruits, no peripheral edema Mental Status: Alert, follows commands, good historian  Neuro: Detailed Neurologic Exam  Speech:    No aphasia, no dysarthria, stuttering speech  Cranial Nerves:    The pupils are equal, round, and reactive to light.. Attempted, Fundi not visualized.  EOMI. No gaze preference. Visual fields full. Face symmetric, Tongue midline. Hearing intact to voice. Shoulder shrug intact  Motor Observation:    no involuntary movements noted. Tone appears normal.     Strength:    5/5     Sensation:  Intact to LT  Plantars downgoing.     ASSESSMENT/PLAN [name] is a 72 y.o. male with history of hypertension and tobacco use presenting with transient right-sided weakness, hypoglycemia, and severe hypertension. He did not receive IV t-PA due resolution of deficits.  Possible TIA:    Resultant - resolution of deficits  CT head -  Age indeterminate right external capsule infarct.  MRI head - not performed due to presence of foreign metallic bodies.  MRA head - not performed due to presence of foreign metallic bodies.  CTA H&N - Chronic appearing dissection RIGHT vertebral artery with severe stenosis distal RIGHT V3 segment.  Occluded RIGHT intradural vertebral artery. Severe stenosis LEFT vertebral artery and basilar artery.  Carotid Doppler - CTA neck  2D Echo - pending  LDL - 125  HgbA1c - 5.6  VTE prophylaxis - Lovenox Fall precautions Diet Heart Room service appropriate? Yes; Fluid consistency: Thin  No  antithrombotic prior to admission, now on aspirin 325 mg daily  Patient counseled to be compliant with his antithrombotic medications  Ongoing aggressive stroke risk factor management  Therapy recommendations: HH PT recommended. OT evaluation is pending.  Disposition:  Pending  Hypertension  Stable  Permissive hypertension (OK if < 220/120) but gradually normalize in 5-7 days  Long-term BP goal normotensive  Hyperlipidemia  Home meds:  No lipid lowering medications prior to admission  LDL 125, goal < 70  Now on Lipitor 80 mg daily  Continue statin at discharge    Other Stroke Risk Factors  Advanced age  Cigarette smoker -  advised to stop smoking  Hx stroke/TIA   Other Active Problems    Plan / Recommendations   Stroke workup   Hospital day # 1  Personally examined patient and images, and have participated in and made any corrections needed to history, physical, neuro exam,assessment and plan as stated above.  I have personally obtained the history, evaluated lab date, reviewed imaging studies and agree with radiology interpretations.    Naomie DeanAntonia Lionell Matuszak, MD Stroke Neurology  To contact Stroke Continuity provider, please refer to [email or web address]. After hours, contact General Neurology

## 2017-11-22 NOTE — Evaluation (Signed)
Occupational Therapy Evaluation Patient Details Name: Austin Weaver MRN: [id number] DOB: [birth year] Today's Date: 11/22/2017    History of Present Illness Pt is a 72 y/o male admitted secondary to acute onset of R sided weakness that lasted ~30 minutes. Head CT was negative for any acute findings. PMH including but not limited to HTN.   Clinical Impression   PTA, pt reports living alone and completing ADL and functional mobility independently. Pt currently requires overall min guard assist for standing ADL and ADL transfers. He additionally presents with decreased problem solving skills, decreased awareness, decreased balance, and decreased R UE coordination impacting his independence and safety with ADL and functional mobility. Additionally noted decreased ability to maintain eye position out of midline for greater than 2-3 seconds at a time. He would benefit from continued OT services while admitted to improve independence and safety with ADL and functional mobility prior to D/C home. Pt reports he will be staying with his cousin briefly post-acute D/C and recommend home health OT follow-up at his cousin's home. Will continue to follow while admitted.    Follow Up Recommendations  Home health OT;Supervision/Assistance - 24 hour(initial 24 hour assist from cousin)    Equipment Recommendations  None recommended by OT(has needs met)    Recommendations for Other Services       Precautions / Restrictions Precautions Precautions: Fall Restrictions Weight Bearing Restrictions: No      Mobility Bed Mobility Overal bed mobility: Modified Independent             General bed mobility comments: increased time and effort  Transfers Overall transfer level: Needs assistance Equipment used: None Transfers: Sit to/from Stand Sit to Stand: Min guard         General transfer comment: MIn guard assist for safety.     Balance Overall balance assessment: Needs  assistance Sitting-balance support: Feet supported Sitting balance-Leahy Scale: Good     Standing balance support: No upper extremity supported;During functional activity Standing balance-Leahy Scale: Fair                             ADL either performed or assessed with clinical judgement   ADL Overall ADL's : Needs assistance/impaired Eating/Feeding: Set up;Sitting   Grooming: Supervision/safety;Standing   Upper Body Bathing: Sitting;Supervision/ safety   Lower Body Bathing: Sit to/from stand;Min guard   Upper Body Dressing : Supervision/safety;Sitting   Lower Body Dressing: Min guard;Sit to/from stand   Toilet Transfer: Min guard;Ambulation;Regular Glass blower/designer Details (indicate cue type and reason): simulated with sit<>stand followed by functional mobility in room Toileting- Clothing Manipulation and Hygiene: Sit to/from stand;Min guard       Functional mobility during ADLs: Min guard General ADL Comments: Min guard assist during functional mobility due to decreased dynamic balance. Pt with decreased R UE gross motor coordination.      Vision Baseline Vision/History: Wears glasses Wears Glasses: Reading only Patient Visual Report: No change from baseline Vision Assessment?: Yes Eye Alignment: Within Functional Limits Ocular Range of Motion: Within Functional Limits Alignment/Gaze Preference: Within Defined Limits Tracking/Visual Pursuits: Impaired - to be further tested in functional context(only able to hold position out of midline 2 seconds) Saccades: Additional eye shifts occurred during testing Visual Fields: Other (comment)(unable to complete without extra eye movements) Additional Comments: During tracking noted inability to maintain eye position out of midline for greater than 2 seconds.      Perception     Praxis  Pertinent Vitals/Pain Pain Assessment: No/denies pain     Hand Dominance Right(responded "I guess so" when  asked)   Extremity/Trunk Assessment Upper Extremity Assessment Upper Extremity Assessment: Generalized weakness;RUE deficits/detail RUE Deficits / Details: Decreased accuracy with finger to nose testing. RUE Coordination: decreased fine motor;decreased gross motor   Lower Extremity Assessment Lower Extremity Assessment: Defer to PT evaluation       Communication Communication Communication: Other (comment)(intermittent stutter)   Cognition Arousal/Alertness: Awake/alert Behavior During Therapy: WFL for tasks assessed/performed Overall Cognitive Status: No family/caregiver present to determine baseline cognitive functioning Area of Impairment: Memory;Following commands;Awareness;Problem solving                     Memory: Decreased short-term memory Following Commands: Follows one step commands consistently;Follows one step commands with increased time   Awareness: Intellectual Problem Solving: Slow processing;Difficulty sequencing;Requires verbal cues General Comments: Difficulty understanding directions during visual testing.    General Comments       Exercises     Shoulder Instructions      Home Living Family/patient expects to be discharged to:: Private residence Living Arrangements: Alone Available Help at Discharge: Family;Friend(s);Available PRN/intermittently   Home Access: Stairs to enter Entrance Stairs-Number of Steps: 1   Home Layout: One level     Bathroom Shower/Tub: Tub/shower unit         Home Equipment: Cane - single point;Walker - 2 wheels;Shower seat   Additional Comments: Information above is from pt's home. Pt plans to go stay with his cousin (who can provide 24/7 supervision/assistance) for "a few days" after d/c. Cousin has walk-in shower with no shower seat.       Prior Functioning/Environment Level of Independence: Independent                 OT Problem List: Impaired balance (sitting and/or standing);Decreased  strength;Decreased coordination;Decreased safety awareness;Decreased knowledge of use of DME or AE;Decreased knowledge of precautions;Decreased cognition;Impaired vision/perception      OT Treatment/Interventions: Self-care/ADL training;Therapeutic exercise;Energy conservation;DME and/or AE instruction;Therapeutic activities;Cognitive remediation/compensation;Visual/perceptual remediation/compensation;Patient/family education;Balance training    OT Goals(Current goals can be found in the care plan section) Acute Rehab OT Goals Patient Stated Goal: to eat OT Goal Formulation: With patient Time For Goal Achievement: 12/06/17 Potential to Achieve Goals: Good ADL Goals Pt Will Perform Grooming: (P) with modified independence;standing Pt Will Transfer to Toilet: (P) with modified independence;ambulating;regular height toilet Pt Will Perform Toileting - Clothing Manipulation and hygiene: (P) with modified independence;sit to/from stand Pt Will Perform Tub/Shower Transfer: (P) with modified independence;ambulating;shower seat;Shower transfer Pt/caregiver will Perform Home Exercise Program: (P) Right Upper extremity;With written HEP provided;With Supervision(increased coordination)  OT Frequency: Min 2X/week   Barriers to D/C:            Co-evaluation              AM-PAC PT "6 Clicks" Daily Activity     Outcome Measure Help from another person eating meals?: A Little Help from another person taking care of personal grooming?: A Little Help from another person toileting, which includes using toliet, bedpan, or urinal?: A Little Help from another person bathing (including washing, rinsing, drying)?: A Little Help from another person to put on and taking off regular upper body clothing?: A Little Help from another person to put on and taking off regular lower body clothing?: A Little 6 Click Score: 18   End of Session Nurse Communication: Mobility status;Other (comment)(pt up in chair  for lunch after session)  Activity Tolerance: Patient tolerated treatment well Patient left: in chair;with call bell/phone within reach  OT Visit Diagnosis: Muscle weakness (generalized) (M62.81);Other symptoms and signs involving cognitive function                Time: 2202-5427 OT Time Calculation (min): 10 min Charges:  OT General Charges $OT Visit: 1 Visit OT Evaluation $OT Eval Moderate Complexity: 1 Mod G-Codes:     Norman Herrlich, MS OTR/L  Pager: [place] 11/22/2017, 12:46 PM

## 2017-11-22 NOTE — Progress Notes (Signed)
  Echocardiogram 2D Echocardiogram has been performed.  Austin Weaver T Austin Weaver 11/22/2017, 3:06 PM

## 2017-11-25 DIAGNOSIS — I10 Essential (primary) hypertension: Secondary | ICD-10-CM | POA: Diagnosis not present

## 2017-11-25 DIAGNOSIS — Z8673 Personal history of transient ischemic attack (TIA), and cerebral infarction without residual deficits: Secondary | ICD-10-CM | POA: Diagnosis not present

## 2017-11-25 DIAGNOSIS — I160 Hypertensive urgency: Secondary | ICD-10-CM | POA: Diagnosis not present

## 2017-11-25 DIAGNOSIS — Z720 Tobacco use: Secondary | ICD-10-CM | POA: Diagnosis not present

## 2017-11-26 NOTE — Discharge Summary (Addendum)
[facility] Discharge Summary   Patient: Austin Weaver ZOX:[id number]   PCP: Patient, No Pcp Per DOB: 08-25-1946   Date of admission: 11/20/2017   Date of discharge: 11/22/2017   Discharge Diagnoses:  Active Problems:   TIA (transient ischemic attack)   Hypertensive urgency   Admitted From: hoem Disposition:  Home with therapy  Recommendations for Outpatient Follow-up:  1. Follow-up with PCP in 1 week.  Follow-up Information    [facility] Follow [facility].   Specialty:  Home Health Services Why:  HHPT arranged- they will call you to set up home visits Contact information: [address] [place] [address] [phone]        PCP. Schedule an appointment as soon as possible for a visit in 1 week(s).          Diet recommendation: Cardiac diet  Activity: The patient is advised to gradually reintroduce usual activities.  Discharge Condition: good  Code Status: Full code  History of present illness: As per the H and P dictated on admission, "Austin Weaver is a 72 y.o. male with history of hypertension was not being compliant with his medications and has been at his cousin's house when he started feeling weak and more specifically weakness on the left upper extremity unable to get up from the sitting position.  This lasted for few minutes and by then EMS was called and when EMS reached home to see the patient symptoms resolved.  Patient denies any difficulty swallowing speaking or any visual symptoms.  Given the patient's symptoms concerning for stroke patient was brought to the ER.  ED Course: CT head done in the ER shows age-indeterminate right external capsule infarct and hyperintense appearance of right MCA area.  Chest x-ray also shows cardiomegaly and possible stent.  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  On my exam patient is able to move all  extremities.  No facial asymmetry tongue is midline pupils are reacting.  Patient's blood pressure is found to be markedly elevated.  Patient is being admitted for further management of possible TIA/stroke.  Discussed with neurologist."  Hospital Course:  Summary of his active problems in the hospital is as following. 1. TIA Discussed with neurology. CT head age-indeterminate right external capsule infarct. CT angiogram of the head and neck shows right vertebral artery stenosis as well as left vertebral and basilar artery stenosis. Echocardiogram shows preserved EF, no PFO no wall motion abnormality. Does not take any medication before admission, now will discharge on 325 mg aspirin as well as 80 mg Lipitor. LDL 125. Physical therapy consulted, recommend home health PT as well as OT. Arranged. Speech therapy recommended no further therapy on discharge.  2. Hypertensive urgency  will allow for permissive hypertension secondary to possible stroke.   Discontinue lisinopril HCTZ and starting on amlodipine on discharge.  3. Abnormal EKG and chest x-ray patient denies any chest pain, troponin negative.  Echocardiogram unremarkable.  Discharging on aspirin and Lipitor.   All other chronic medical condition were stable during the hospitalization.  Patient was seen by physical therapy, who recommended home health, which was arranged by Child psychotherapist and case Production designer, theatre/television/film. On the day of the discharge the patient's vitals were stable, and no other acute medical condition were reported by patient. the patient was felt safe to be discharge at home with home health.  Procedures and Results:  Echocardiogram    Consultations:  Neurology  DISCHARGE MEDICATION: Allergies as of 11/22/2017   No Known Allergies     Medication List    TAKE these medications   amLODipine 5 MG tablet Commonly known as:  NORVASC Take 1 tablet (5 mg total) by mouth daily.   aspirin 325 MG tablet Take 1 tablet (325 mg  total) by mouth daily.   atorvastatin 80 MG tablet Commonly known as:  LIPITOR Take 1 tablet (80 mg total) by mouth daily at 6 PM.      No Known Allergies Discharge Instructions    Ambulatory referral to Neurology   Complete by:  As directed    An appointment is requested in approximately: 8 weeks   Diet - low sodium heart healthy   Complete by:  As directed    Discharge instructions   Complete by:  As directed    It is important that you read following instructions as well as go over your medication list with RN to help you understand your care after this hospitalization.  Discharge Instructions:  Please follow-up with PCP in one week  Please request your primary care physician to go over all Hospital Tests and Procedure/Radiological results at the follow up,  Please get all Hospital records sent to your PCP by signing hospital release before you go home.   Do not take more than prescribed Pain, Sleep and Anxiety Medications. You were cared for by a hospitalist during your hospital stay. If you have any questions about your discharge medications or the care you received while you were in the hospital after you are discharged, you can call the unit and ask to speak with the hospitalist on call if the hospitalist that took care of you is not available.  Once you are discharged, your primary care physician will handle any further medical issues. Please note that NO REFILLS for any discharge medications will be authorized once you are discharged, as it is imperative that you return to your primary care physician (or establish a relationship with a primary care physician if you do not have one) for your aftercare needs so that they can reassess your need for medications and monitor your lab values. You Must read complete instructions/literature along with all the possible adverse reactions/side effects for all the Medicines you take and that have been prescribed to you. Take any new  Medicines after you have completely understood and accept all the possible adverse reactions/side effects. Wear Seat belts while driving. If you have smoked or chewed Tobacco in the last 2 yrs please stop smoking and/or stop any Recreational drug use.   Increase activity slowly   Complete by:  As directed      Discharge Exam: Filed Weights   11/20/17 1944  Weight: 55.3 kg (122 lb)   Vitals:   11/22/17 0800 11/22/17 1127  BP: (!) 172/96 (!) 172/91  Pulse: (!) 58   Resp: 16 19  Temp:    SpO2: 96%    General: Appear in no distress, no Rash; Oral Mucosa moist. Cardiovascular: S1 and S2 Present, no Murmur, no JVD Respiratory: Bilateral Air entry present and Clear to Auscultation, no Crackles, no wheezes Abdomen: Bowel Sound present, Soft and no tenderness Extremities: no Pedal edema, no calf tenderness Neurology: Grossly no new focal neuro deficit.  The results of significant diagnostics from this hospitalization (including imaging, microbiology, ancillary and laboratory) are listed below for reference.    Significant Diagnostic Studies: Ct Angio Head W Or Wo Contrast  Result Date: 11/21/2017  CLINICAL DATA:  LEFT-sided weakness, follow-up stroke. History of hypertension. EXAM: CT ANGIOGRAPHY HEAD AND NECK TECHNIQUE: Multidetector CT imaging of the head and neck was performed using the standard protocol during bolus administration of intravenous contrast. Multiplanar CT image reconstructions and MIPs were obtained to evaluate the vascular anatomy. Carotid stenosis measurements (when applicable) are obtained utilizing NASCET criteria, using the distal internal carotid diameter as the denominator. CONTRAST:  50mL ISOVUE-370 IOPAMIDOL (ISOVUE-370) INJECTION 76% COMPARISON:  CT HEAD November 20, 2017 FINDINGS: CTA NECK FINDINGS: AORTIC ARCH: Normal appearance of the thoracic arch, 2 vessel arch is a normal variant. Mild calcific atherosclerosis aortic arch. The origins of the innominate, left  Common carotid artery and subclavian artery are widely patent. RIGHT CAROTID SYSTEM: Common carotid artery is widely patent, mild calcific atherosclerosis. Trace calcific atherosclerosis carotid bifurcation without hemodynamically significant stenosis by NASCET criteria. Normal appearance of the internal carotid artery. LEFT CAROTID SYSTEM: Common carotid artery is widely patent, tortuous lateral course with moderate calcific atherosclerosis. Trace calcific atherosclerosis carotid bifurcation without hemodynamically significant stenosis by NASCET criteria. Normal appearance of the internal carotid artery. VERTEBRAL ARTERIES:Left vertebral artery is dominant, multiple folds LEFT V1 segment. Diminutive irregular RIGHT vertebral artery with severe stenosis RIGHT distal V3 segment. SKELETON: No acute osseous process though bone windows have not been submitted. Patient is edentulous. Moderate to severe cervical spondylosis. OTHER NECK: Soft tissues of the neck are nonacute though, not tailored for evaluation. UPPER CHEST: Included lung apices are clear. Mild centrilobular emphysema. No superior mediastinal lymphadenopathy. CTA HEAD FINDINGS: ANTERIOR CIRCULATION: Patent cervical internal carotid arteries, petrous, cavernous and supra clinoid internal carotid arteries. Patent anterior communicating artery. Patent anterior and middle cerebral arteries. No large vessel occlusion, significant stenosis, contrast extravasation or aneurysm. POSTERIOR CIRCULATION: Occluded RIGHT V4 segment. LEFT vertebral artery is patent with severe stenosis distally due to calcific atherosclerosis. Heavy calcific atherosclerosis basilar artery with long segment severe stenosis, possible occlusion. RIGHT posterior-inferior cerebellar artery not distinctly identified. Bilateral posterior communicating arteries present. Moderate stenosis RIGHT P2 segment. No large vessel occlusion, significant stenosis, contrast extravasation or aneurysm. VENOUS  SINUSES: Major dural venous sinuses are patent though not tailored for evaluation on this angiographic examination. ANATOMIC VARIANTS: None. DELAYED PHASE: No abnormal intracranial enhancement. MIP images reviewed. IMPRESSION: CTA NECK: 1. No hemodynamically significant stenosis internal carotid artery's. 2. 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 CTA HEAD: 1. Occluded RIGHT intradural vertebral artery. Severe stenosis LEFT vertebral artery and basilar artery due to heavy calcific atherosclerosis with possible tandem occlusion. 2. Complete circle bullous. No anterior circulation emergent large vessel occlusion or flow limiting stenosis. Aortic Atherosclerosis (ICD10-I70.0) and Emphysema (ICD10-J43.9). Electronically Signed   By: Awilda Metro M.D.   On: 11/21/2017 05:05   Dg Chest 2 View  Result Date: 11/21/2017 CLINICAL DATA:  Stroke. EXAM: CHEST  2 VIEW COMPARISON:  None. FINDINGS: The cardiomediastinal contours are normal. Heart is upper normal in size. Atherosclerosis of thoracic aorta. Coronary artery calcification versus stent. Mild interstitial coarsening. Pulmonary vasculature is normal. No consolidation, pleural effusion, or pneumothorax. No acute osseous abnormalities are seen. IMPRESSION: 1. Borderline cardiomegaly with aortic atherosclerosis. Coronary artery calcification versus stent. 2. Mild interstitial coarsening, likely chronic, however no prior exams for comparison. Electronically Signed   By: Rubye Oaks M.D.   On: 11/21/2017 02:50   Ct Head Wo Contrast  Result Date: 11/20/2017 CLINICAL DATA:  Hypoglycemia, left-sided weakness and altered mental status. EXAM: CT HEAD WITHOUT CONTRAST TECHNIQUE: Contiguous axial  images were obtained from the base of the skull through the vertex without intravenous contrast. COMPARISON:  None. FINDINGS: Brain: No acute hemorrhage. No midline shift or other mass effect. Age-indeterminate  infarct of the right external capsule. Advanced atrophy. There is periventricular hypoattenuation compatible with chronic microvascular disease.Asymmetric enlargement of the right hemisphere is likely congenital. Vascular: Extensive calcification of the vertebral and internal carotid arteries at the skull base. Calcification extends to the MCA origins. The right middle cerebral artery is mildly hyperdense, particularly of the M2 segment. Skull: Normal visualized skull base, calvarium and extracranial soft tissues. Sinuses/Orbits: No sinus fluid levels or advanced mucosal thickening. No mastoid effusion. Normal orbits. IMPRESSION: 1. No acute hemorrhage. 2. Age indeterminate right external capsule infarct. Given the volume loss within the right frontal lobe, this is more likely to be chronic. Hyperdense appearance of the right MCA is probably due to atherosclerotic disease, though thrombus could have the same appearance. 3. Chronic ischemic microangiopathy and severe calcific atherosclerosis. Electronically Signed   By: Deatra Robinson M.D.   On: 11/20/2017 22:38   Ct Angio Neck W Or Wo Contrast  Result Date: 11/21/2017 CLINICAL DATA:  LEFT-sided weakness, follow-up stroke. History of hypertension. EXAM: CT ANGIOGRAPHY HEAD AND NECK TECHNIQUE: Multidetector CT imaging of the head and neck was performed using the standard protocol during bolus administration of intravenous contrast. Multiplanar CT image reconstructions and MIPs were obtained to evaluate the vascular anatomy. Carotid stenosis measurements (when applicable) are obtained utilizing NASCET criteria, using the distal internal carotid diameter as the denominator. CONTRAST:  50mL ISOVUE-370 IOPAMIDOL (ISOVUE-370) INJECTION 76% COMPARISON:  CT HEAD November 20, 2017 FINDINGS: CTA NECK FINDINGS: AORTIC ARCH: Normal appearance of the thoracic arch, 2 vessel arch is a normal variant. Mild calcific atherosclerosis aortic arch. The origins of the innominate, left  Common carotid artery and subclavian artery are widely patent. RIGHT CAROTID SYSTEM: Common carotid artery is widely patent, mild calcific atherosclerosis. Trace calcific atherosclerosis carotid bifurcation without hemodynamically significant stenosis by NASCET criteria. Normal appearance of the internal carotid artery. LEFT CAROTID SYSTEM: Common carotid artery is widely patent, tortuous lateral course with moderate calcific atherosclerosis. Trace calcific atherosclerosis carotid bifurcation without hemodynamically significant stenosis by NASCET criteria. Normal appearance of the internal carotid artery. VERTEBRAL ARTERIES:Left vertebral artery is dominant, multiple folds LEFT V1 segment. Diminutive irregular RIGHT vertebral artery with severe stenosis RIGHT distal V3 segment. SKELETON: No acute osseous process though bone windows have not been submitted. Patient is edentulous. Moderate to severe cervical spondylosis. OTHER NECK: Soft tissues of the neck are nonacute though, not tailored for evaluation. UPPER CHEST: Included lung apices are clear. Mild centrilobular emphysema. No superior mediastinal lymphadenopathy. CTA HEAD FINDINGS: ANTERIOR CIRCULATION: Patent cervical internal carotid arteries, petrous, cavernous and supra clinoid internal carotid arteries. Patent anterior communicating artery. Patent anterior and middle cerebral arteries. No large vessel occlusion, significant stenosis, contrast extravasation or aneurysm. POSTERIOR CIRCULATION: Occluded RIGHT V4 segment. LEFT vertebral artery is patent with severe stenosis distally due to calcific atherosclerosis. Heavy calcific atherosclerosis basilar artery with long segment severe stenosis, possible occlusion. RIGHT posterior-inferior cerebellar artery not distinctly identified. Bilateral posterior communicating arteries present. Moderate stenosis RIGHT P2 segment. No large vessel occlusion, significant stenosis, contrast extravasation or aneurysm. VENOUS  SINUSES: Major dural venous sinuses are patent though not tailored for evaluation on this angiographic examination. ANATOMIC VARIANTS: None. DELAYED PHASE: No abnormal intracranial enhancement. MIP images reviewed. IMPRESSION: CTA NECK: 1. No hemodynamically significant stenosis internal carotid artery's. 2. Chronic appearing dissection RIGHT vertebral  artery with severe stenosis distal RIGHT V3 segment. CTA HEAD: 1. Occluded RIGHT intradural vertebral artery. Severe stenosis LEFT vertebral artery and basilar artery due to heavy calcific atherosclerosis with possible tandem occlusion. 2. Complete circle bullous. No anterior circulation emergent large vessel occlusion or flow limiting stenosis. Aortic Atherosclerosis (ICD10-I70.0) and Emphysema (ICD10-J43.9). Electronically Signed   By: Awilda Metroourtnay  Bloomer M.D.   On: 11/21/2017 05:05    Microbiology: No results found for this or any previous visit (from the past 240 hour(s)).   Labs: CBC: Recent Labs  Lab 11/20/17 1948 11/21/17 0448  WBC 7.1 7.9  HGB 16.0 16.8  HCT 46.8 48.6  MCV 91.1 90.8  PLT 143* 143*   Basic Metabolic Panel: Recent Labs  Lab 11/20/17 1948 11/21/17 0448  NA 138  --   K 3.5  --   CL 105  --   CO2 21*  --   GLUCOSE 99  --   BUN 9  --   CREATININE 1.18 1.05  CALCIUM 8.9  --    Liver Function Tests: Recent Labs  Lab 11/20/17 1948  AST 17  ALT 12*  ALKPHOS 86  BILITOT 0.8  PROT 6.9  ALBUMIN 3.7   No results for input(s): LIPASE, AMYLASE in the last 168 hours. No results for input(s): AMMONIA in the last 168 hours. Cardiac Enzymes: Recent Labs  Lab 11/21/17 0619 11/21/17 1129 11/21/17 1909  TROPONINI <0.03 <0.03 <0.03   BNP (last 3 results) No results for input(s): BNP in the last 8760 hours. CBG: Recent Labs  Lab 11/20/17 2009 11/21/17 0141  GLUCAP 81 94   Time spent: 35 minutes  Signed:  Lynden OxfordPranav Matther Labell  [facility] 11/22/2017 , 5:30 PM

## 2017-12-16 ENCOUNTER — Ambulatory Visit: Payer: Medicare Other | Admitting: Internal Medicine

## 2017-12-28 ENCOUNTER — Telehealth: Payer: Self-pay | Admitting: *Deleted

## 2017-12-28 ENCOUNTER — Ambulatory Visit: Payer: Self-pay | Admitting: Neurology

## 2017-12-28 NOTE — Telephone Encounter (Signed)
Pt no showed new pt appt on 12/28/2017 @ 2:00.

## 2017-12-29 ENCOUNTER — Encounter: Payer: Self-pay | Admitting: Neurology

## 2018-07-06 ENCOUNTER — Ambulatory Visit: Payer: Medicare Other | Admitting: Internal Medicine

## 2018-07-06 DIAGNOSIS — Z0289 Encounter for other administrative examinations: Secondary | ICD-10-CM

## 2018-09-21 ENCOUNTER — Encounter (HOSPITAL_COMMUNITY): Payer: Self-pay | Admitting: Family Medicine

## 2018-09-21 ENCOUNTER — Emergency Department (HOSPITAL_COMMUNITY): Payer: Medicare Other

## 2018-09-21 ENCOUNTER — Inpatient Hospital Stay (HOSPITAL_COMMUNITY): Payer: Medicare Other

## 2018-09-21 ENCOUNTER — Inpatient Hospital Stay (HOSPITAL_COMMUNITY)
Admission: EM | Admit: 2018-09-21 | Discharge: 2018-09-27 | DRG: 871 | Disposition: A | Discharge status: Home Health | Payer: Medicare Other | Attending: Family Medicine | Admitting: Family Medicine

## 2018-09-21 DIAGNOSIS — A4150 Gram-negative sepsis, unspecified: Secondary | ICD-10-CM | POA: Diagnosis not present

## 2018-09-21 DIAGNOSIS — E538 Deficiency of other specified B group vitamins: Secondary | ICD-10-CM | POA: Diagnosis present

## 2018-09-21 DIAGNOSIS — R4182 Altered mental status, unspecified: Secondary | ICD-10-CM | POA: Diagnosis present

## 2018-09-21 DIAGNOSIS — M869 Osteomyelitis, unspecified: Secondary | ICD-10-CM | POA: Diagnosis not present

## 2018-09-21 DIAGNOSIS — Z79899 Other long term (current) drug therapy: Secondary | ICD-10-CM

## 2018-09-21 DIAGNOSIS — E785 Hyperlipidemia, unspecified: Secondary | ICD-10-CM | POA: Diagnosis present

## 2018-09-21 DIAGNOSIS — F0281 Dementia in other diseases classified elsewhere with behavioral disturbance: Secondary | ICD-10-CM | POA: Diagnosis not present

## 2018-09-21 DIAGNOSIS — G309 Alzheimer's disease, unspecified: Secondary | ICD-10-CM

## 2018-09-21 DIAGNOSIS — I10 Essential (primary) hypertension: Secondary | ICD-10-CM | POA: Diagnosis not present

## 2018-09-21 DIAGNOSIS — M7989 Other specified soft tissue disorders: Secondary | ICD-10-CM | POA: Diagnosis not present

## 2018-09-21 DIAGNOSIS — R9389 Abnormal findings on diagnostic imaging of other specified body structures: Secondary | ICD-10-CM | POA: Diagnosis not present

## 2018-09-21 DIAGNOSIS — R32 Unspecified urinary incontinence: Secondary | ICD-10-CM | POA: Diagnosis present

## 2018-09-21 DIAGNOSIS — G9340 Encephalopathy, unspecified: Secondary | ICD-10-CM

## 2018-09-21 DIAGNOSIS — A4152 Sepsis due to Pseudomonas: Secondary | ICD-10-CM | POA: Diagnosis present

## 2018-09-21 DIAGNOSIS — W19XXXD Unspecified fall, subsequent encounter: Secondary | ICD-10-CM | POA: Diagnosis not present

## 2018-09-21 DIAGNOSIS — B965 Pseudomonas (aeruginosa) (mallei) (pseudomallei) as the cause of diseases classified elsewhere: Secondary | ICD-10-CM | POA: Diagnosis not present

## 2018-09-21 DIAGNOSIS — R000 Tachycardia, unspecified: Secondary | ICD-10-CM | POA: Diagnosis not present

## 2018-09-21 DIAGNOSIS — R531 Weakness: Secondary | ICD-10-CM

## 2018-09-21 DIAGNOSIS — I639 Cerebral infarction, unspecified: Secondary | ICD-10-CM | POA: Insufficient documentation

## 2018-09-21 DIAGNOSIS — I69354 Hemiplegia and hemiparesis following cerebral infarction affecting left non-dominant side: Secondary | ICD-10-CM | POA: Diagnosis not present

## 2018-09-21 DIAGNOSIS — Z8673 Personal history of transient ischemic attack (TIA), and cerebral infarction without residual deficits: Secondary | ICD-10-CM

## 2018-09-21 DIAGNOSIS — Z8249 Family history of ischemic heart disease and other diseases of the circulatory system: Secondary | ICD-10-CM

## 2018-09-21 DIAGNOSIS — W19XXXA Unspecified fall, initial encounter: Secondary | ICD-10-CM | POA: Diagnosis present

## 2018-09-21 DIAGNOSIS — K5730 Diverticulosis of large intestine without perforation or abscess without bleeding: Secondary | ICD-10-CM | POA: Diagnosis not present

## 2018-09-21 DIAGNOSIS — F17200 Nicotine dependence, unspecified, uncomplicated: Secondary | ICD-10-CM | POA: Diagnosis not present

## 2018-09-21 DIAGNOSIS — A4151 Sepsis due to Escherichia coli [E. coli]: Secondary | ICD-10-CM | POA: Diagnosis not present

## 2018-09-21 DIAGNOSIS — R4781 Slurred speech: Secondary | ICD-10-CM | POA: Diagnosis not present

## 2018-09-21 DIAGNOSIS — R6520 Severe sepsis without septic shock: Secondary | ICD-10-CM | POA: Diagnosis not present

## 2018-09-21 DIAGNOSIS — B9620 Unspecified Escherichia coli [E. coli] as the cause of diseases classified elsewhere: Secondary | ICD-10-CM | POA: Diagnosis not present

## 2018-09-21 DIAGNOSIS — R6510 Systemic inflammatory response syndrome (SIRS) of non-infectious origin without acute organ dysfunction: Secondary | ICD-10-CM | POA: Insufficient documentation

## 2018-09-21 DIAGNOSIS — R509 Fever, unspecified: Secondary | ICD-10-CM | POA: Diagnosis not present

## 2018-09-21 DIAGNOSIS — F02818 Dementia in other diseases classified elsewhere, unspecified severity, with other behavioral disturbance: Secondary | ICD-10-CM

## 2018-09-21 DIAGNOSIS — N179 Acute kidney failure, unspecified: Secondary | ICD-10-CM | POA: Diagnosis present

## 2018-09-21 DIAGNOSIS — R7989 Other specified abnormal findings of blood chemistry: Secondary | ICD-10-CM | POA: Diagnosis not present

## 2018-09-21 DIAGNOSIS — G308 Other Alzheimer's disease: Secondary | ICD-10-CM | POA: Diagnosis not present

## 2018-09-21 DIAGNOSIS — R29706 NIHSS score 6: Secondary | ICD-10-CM | POA: Diagnosis present

## 2018-09-21 DIAGNOSIS — G9341 Metabolic encephalopathy: Secondary | ICD-10-CM | POA: Diagnosis present

## 2018-09-21 DIAGNOSIS — R0689 Other abnormalities of breathing: Secondary | ICD-10-CM | POA: Diagnosis not present

## 2018-09-21 DIAGNOSIS — R0989 Other specified symptoms and signs involving the circulatory and respiratory systems: Secondary | ICD-10-CM | POA: Diagnosis not present

## 2018-09-21 DIAGNOSIS — R410 Disorientation, unspecified: Secondary | ICD-10-CM | POA: Diagnosis not present

## 2018-09-21 DIAGNOSIS — R202 Paresthesia of skin: Secondary | ICD-10-CM | POA: Diagnosis not present

## 2018-09-21 DIAGNOSIS — Z7982 Long term (current) use of aspirin: Secondary | ICD-10-CM | POA: Diagnosis not present

## 2018-09-21 DIAGNOSIS — R609 Edema, unspecified: Secondary | ICD-10-CM | POA: Diagnosis present

## 2018-09-21 DIAGNOSIS — R7881 Bacteremia: Secondary | ICD-10-CM | POA: Diagnosis not present

## 2018-09-21 HISTORY — DX: Unspecified fall, initial encounter: W19.XXXA

## 2018-09-21 HISTORY — DX: Cerebral infarction, unspecified: I63.9

## 2018-09-21 HISTORY — DX: Personal history of transient ischemic attack (TIA), and cerebral infarction without residual deficits: Z86.73

## 2018-09-21 LAB — CBC
HCT: 47.5 % (ref 39.0–52.0)
HCT: 45 % (ref 39.0–52.0)
Hemoglobin: 15.6 g/dL (ref 13.0–17.0)
Hemoglobin: 14.9 g/dL (ref 13.0–17.0)
MCH: 30.4 pg (ref 26.0–34.0)
MCH: 29.9 pg (ref 26.0–34.0)
MCHC: 32.8 g/dL (ref 30.0–36.0)
MCHC: 33.1 g/dL (ref 30.0–36.0)
MCV: 92.4 fL (ref 80.0–100.0)
MCV: 90.4 fL (ref 80.0–100.0)
NRBC: 0 % (ref 0.0–0.2)
Platelets: 142 10*3/uL — ABNORMAL LOW (ref 150–400)
Platelets: 152 10*3/uL (ref 150–400)
RBC: 5.14 MIL/uL (ref 4.22–5.81)
RBC: 4.98 MIL/uL (ref 4.22–5.81)
RDW: 14.4 % (ref 11.5–15.5)
RDW: 14.4 % (ref 11.5–15.5)
WBC: 9.7 10*3/uL (ref 4.0–10.5)
WBC: 14.6 10*3/uL — ABNORMAL HIGH (ref 4.0–10.5)
nRBC: 0 % (ref 0.0–0.2)

## 2018-09-21 LAB — CREATININE, SERUM
Creatinine, Ser: 1.45 mg/dL — ABNORMAL HIGH (ref 0.61–1.24)
Creatinine, Ser: 1.5 mg/dL — ABNORMAL HIGH (ref 0.61–1.24)
GFR calc Af Amer: 56 mL/min — ABNORMAL LOW (ref >60)
GFR calc non Af Amer: 46 mL/min — ABNORMAL LOW (ref >60)
GFR, EST AFRICAN AMERICAN: 54 mL/min — AB (ref >60)
GFR, EST NON AFRICAN AMERICAN: 48 mL/min — AB (ref >60)

## 2018-09-21 LAB — I-STAT CHEM 8, ED
BUN: 12 mg/dL (ref 8–23)
Calcium, Ion: 1.11 mmol/L — ABNORMAL LOW (ref 1.15–1.40)
Chloride: 101 mmol/L (ref 98–111)
Creatinine, Ser: 1.3 mg/dL — ABNORMAL HIGH (ref 0.61–1.24)
Glucose, Bld: 86 mg/dL (ref 70–99)
HCT: 49 % (ref 39.0–52.0)
Hemoglobin: 16.7 g/dL (ref 13.0–17.0)
Potassium: 4.1 mmol/L (ref 3.5–5.1)
Sodium: 138 mmol/L (ref 135–145)
TCO2: 27 mmol/L (ref 22–32)

## 2018-09-21 LAB — HEMOGLOBIN A1C
Hgb A1c MFr Bld: 5.4 % (ref 4.8–5.6)
Mean Plasma Glucose: 108.28 mg/dL

## 2018-09-21 LAB — URINALYSIS, ROUTINE W REFLEX MICROSCOPIC
Bilirubin Urine: NEGATIVE
Glucose, UA: NEGATIVE mg/dL
Hgb urine dipstick: NEGATIVE
Ketones, ur: NEGATIVE mg/dL
Leukocytes, UA: NEGATIVE
Nitrite: NEGATIVE
PH: 9 — AB (ref 5.0–8.0)
Protein, ur: NEGATIVE mg/dL
Specific Gravity, Urine: 1.009 (ref 1.005–1.030)

## 2018-09-21 LAB — DIFFERENTIAL
Abs Immature Granulocytes: 0.04 10*3/uL (ref 0.00–0.07)
Basophils Absolute: 0 10*3/uL (ref 0.0–0.1)
Basophils Relative: 0 %
Eosinophils Absolute: 0 10*3/uL (ref 0.0–0.5)
Eosinophils Relative: 0 %
Immature Granulocytes: 0 %
LYMPHS ABS: 0.4 10*3/uL — AB (ref 0.7–4.0)
Lymphocytes Relative: 5 %
MONOS PCT: 6 %
Monocytes Absolute: 0.6 10*3/uL (ref 0.1–1.0)
Neutro Abs: 8.5 10*3/uL — ABNORMAL HIGH (ref 1.7–7.7)
Neutrophils Relative %: 89 %

## 2018-09-21 LAB — AMMONIA: Ammonia: 32 umol/L (ref 9–35)

## 2018-09-21 LAB — RAPID URINE DRUG SCREEN, HOSP PERFORMED
Amphetamines: NOT DETECTED
Barbiturates: NOT DETECTED
Benzodiazepines: NOT DETECTED
Cocaine: NOT DETECTED
Opiates: NOT DETECTED
Tetrahydrocannabinol: NOT DETECTED

## 2018-09-21 LAB — COMPREHENSIVE METABOLIC PANEL
ALT: 17 U/L (ref 0–44)
AST: 21 U/L (ref 15–41)
Albumin: 4.1 g/dL (ref 3.5–5.0)
Alkaline Phosphatase: 75 U/L (ref 38–126)
Anion gap: 13 (ref 5–15)
BILIRUBIN TOTAL: 1.2 mg/dL (ref 0.3–1.2)
BUN: 10 mg/dL (ref 8–23)
CO2: 22 mmol/L (ref 22–32)
Calcium: 9.2 mg/dL (ref 8.9–10.3)
Chloride: 102 mmol/L (ref 98–111)
Creatinine, Ser: 1.54 mg/dL — ABNORMAL HIGH (ref 0.61–1.24)
GFR calc Af Amer: 52 mL/min — ABNORMAL LOW (ref >60)
GFR calc non Af Amer: 45 mL/min — ABNORMAL LOW (ref >60)
Glucose, Bld: 89 mg/dL (ref 70–99)
Potassium: 4.1 mmol/L (ref 3.5–5.1)
Sodium: 137 mmol/L (ref 135–145)
TOTAL PROTEIN: 7.5 g/dL (ref 6.5–8.1)

## 2018-09-21 LAB — CK: Total CK: 107 U/L (ref 49–397)

## 2018-09-21 LAB — INFLUENZA PANEL BY PCR (TYPE A & B)
INFLAPCR: NEGATIVE
Influenza B By PCR: NEGATIVE

## 2018-09-21 LAB — TSH: TSH: 0.264 u[IU]/mL — ABNORMAL LOW (ref 0.350–4.500)

## 2018-09-21 LAB — APTT: aPTT: 28 seconds (ref 24–36)

## 2018-09-21 LAB — I-STAT TROPONIN, ED: Troponin i, poc: 0.01 ng/mL (ref 0.00–0.08)

## 2018-09-21 LAB — I-STAT CG4 LACTIC ACID, ED: Lactic Acid, Venous: 1.96 mmol/L — ABNORMAL HIGH (ref 0.5–1.9)

## 2018-09-21 LAB — PROTIME-INR
INR: 1.04
Prothrombin Time: 13.5 seconds (ref 11.4–15.2)

## 2018-09-21 LAB — MAGNESIUM: Magnesium: 1.8 mg/dL (ref 1.7–2.4)

## 2018-09-21 LAB — LACTIC ACID, PLASMA: Lactic Acid, Venous: 2.4 mmol/L (ref 0.5–1.9)

## 2018-09-21 LAB — TROPONIN I: Troponin I: <0.03 ng/mL (ref <0.03)

## 2018-09-21 LAB — SALICYLATE LEVEL

## 2018-09-21 LAB — PHOSPHORUS: Phosphorus: 2.5 mg/dL (ref 2.5–4.6)

## 2018-09-21 LAB — VITAMIN B12: Vitamin B-12: 108 pg/mL — ABNORMAL LOW (ref 180–914)

## 2018-09-21 MED ORDER — POLYETHYLENE GLYCOL 3350 17 G PO PACK: 17.0000 g | PACK | Freq: Every day | ORAL | Status: DC | PRN

## 2018-09-21 MED ORDER — VANCOMYCIN HCL IN DEXTROSE 750-5 MG/150ML-% IV SOLN: 750.0000 mg | INTRAVENOUS | Status: DC

## 2018-09-21 MED ORDER — ONDANSETRON HCL 4 MG PO TABS: 4.0000 mg | ORAL_TABLET | Freq: Four times a day (QID) | ORAL | Status: DC | PRN

## 2018-09-21 MED ORDER — SODIUM CHLORIDE 0.9 % IV BOLUS
1000.0000 mL | Freq: Once | INTRAVENOUS | Status: AC
  Administered 2018-09-21: 1000 mL via INTRAVENOUS

## 2018-09-21 MED ORDER — SODIUM CHLORIDE 0.9 % IV SOLN
INTRAVENOUS | Status: AC
  Administered 2018-09-21 – 2018-09-22 (×4): via INTRAVENOUS

## 2018-09-21 MED ORDER — ACETAMINOPHEN 650 MG RE SUPP: 650.0000 mg | Freq: Four times a day (QID) | RECTAL | Status: DC | PRN

## 2018-09-21 MED ORDER — ONDANSETRON HCL 4 MG/2ML IJ SOLN: 4.0000 mg | Freq: Four times a day (QID) | INTRAMUSCULAR | Status: DC | PRN

## 2018-09-21 MED ORDER — VANCOMYCIN HCL IN DEXTROSE 1-5 GM/200ML-% IV SOLN
1000.0000 mg | Freq: Once | INTRAVENOUS | Status: AC
  Administered 2018-09-21: 1000 mg via INTRAVENOUS
  Filled 2018-09-21: qty 200

## 2018-09-21 MED ORDER — ATORVASTATIN CALCIUM 80 MG PO TABS
80.0000 mg | ORAL_TABLET | Freq: Every day | ORAL | Status: DC
  Administered 2018-09-22 – 2018-09-26 (×5): 80 mg via ORAL
  Filled 2018-09-21 (×5): qty 1

## 2018-09-21 MED ORDER — ACETAMINOPHEN 500 MG PO TABS
1000.0000 mg | ORAL_TABLET | Freq: Once | ORAL | Status: AC
  Administered 2018-09-21: 1000 mg via ORAL
  Filled 2018-09-21: qty 2

## 2018-09-21 MED ORDER — ACETAMINOPHEN 325 MG PO TABS: 650.0000 mg | ORAL_TABLET | Freq: Four times a day (QID) | ORAL | Status: DC | PRN

## 2018-09-21 MED ORDER — ENOXAPARIN SODIUM 40 MG/0.4ML ~~LOC~~ SOLN
40.0000 mg | SUBCUTANEOUS | Status: DC
  Administered 2018-09-21 – 2018-09-26 (×6): 40 mg via SUBCUTANEOUS
  Filled 2018-09-21 (×6): qty 0.4

## 2018-09-21 MED ORDER — SENNA 8.6 MG PO TABS
1.0000 | ORAL_TABLET | Freq: Two times a day (BID) | ORAL | Status: DC
  Administered 2018-09-21 – 2018-09-27 (×11): 8.6 mg via ORAL
  Filled 2018-09-21 (×11): qty 1

## 2018-09-21 MED ORDER — ASPIRIN 325 MG PO TABS
325.0000 mg | ORAL_TABLET | Freq: Every day | ORAL | Status: DC
  Administered 2018-09-21 – 2018-09-27 (×7): 325 mg via ORAL
  Filled 2018-09-21 (×7): qty 1

## 2018-09-21 MED ORDER — SODIUM CHLORIDE 0.9 % IV SOLN
2.0000 g | INTRAVENOUS | Status: DC
  Administered 2018-09-21: 2 g via INTRAVENOUS
  Filled 2018-09-21: qty 20

## 2018-09-21 MED ORDER — SODIUM CHLORIDE 0.9 % IV BOLUS
500.0000 mL | Freq: Once | INTRAVENOUS | Status: AC
  Administered 2018-09-21: 500 mL via INTRAVENOUS

## 2018-09-21 NOTE — Code Documentation (Signed)
72 yo male coming from home with complaints of sudden onset of mental status change. Pt's wife called EMS and EMS activated a Code Stroke due to left weakness and inability to answer questions. Pt has hx of stroke. Stroke Team met patient upon arrival to the ED. Initial NIHSS 6 due to inability to answer month, slight stuttering and slurred speech, left arm and leg weakness, right leg weakness. Pt has hx of left sided weakness from past stroke. EMS also noted 103 Temp and 111 HR. Noted to be incontinent of urine. Pt's baseline is alert and oriented x4 per wife. CT Head negative for hemorrhage. Neurology does not suspect stroke. Symptoms consistent with infection. EDP made aware. Pt to continue with sepsis work up. Handoff given to Clarisa Fling, Therapist, sports.

## 2018-09-21 NOTE — ED Triage Notes (Signed)
Pt arrived via [facility] from home after family found pt on floor in restroom. Pt normally able to ambulate without assistance or device. Per EMS, pt was unable to stand or help himself up. Pt was unable to verbalize answers to questions. Pt denies pain. Pt has no previous CVA or TIA hx. EMs reported fever of 103.0. 100.0 at time of triage. EMS v/s: 180/90, hr 116, rr 28, cbg 99. 98%ra. Pt is alert at time of triage. Answering questions, but slow to respond at times. Difficulty understanding speech.

## 2018-09-21 NOTE — Consult Note (Addendum)
Neurology Consultation  Reason for Consult: Code stroke Referring Physician: Rodena Medin  CC: Altered mental status  History is obtained from: EMS  HPI: Austin Weaver is a 72 y.o. male history of hypertension, baseline left side weakness. EMS was called to the house after what wife states there was a sudden onset of altered mental status.  When EMS arrived at the scene patient was confused and had a temperature of 103.  Patient usually is able to communicate, take care of himself, walk independently.  Apparently per EMS the wife/girlfriend remained on the phone and did not give much information and did not seem to be involved in the transportation and/or care for the patient.  Patient was brought as a code stroke secondary to the confusion and inability to follow commands.  Patient was brought immediately to CT which did show prior infarcts in R corona radiata and left thalamus.  Given the history of temperature of 103 and also EMS noting that patient had foul-smelling urine no TPA was administered.    ED course: CT of head    LKW: 11 AM on 09/21/2018 tpa given?: no, lower suspicion for stroke and improving symptoms Premorbid modified Rankin scale (mRS): 0 NIHSS of 6  ROS: Unable to obtain due to altered mental status.   Past Medical History:  Diagnosis Date  . Hypertension      Family History  Problem Relation Age of Onset  . Hypertension Mother    Social History:   reports that he has been smoking. He has never used smokeless tobacco. He reports that he does not drink alcohol. No history on file for drug.  Medications  Current Facility-Administered Medications:  .  acetaminophen (TYLENOL) tablet 1,000 mg, 1,000 mg, Oral, Once, Messick, Peter C, MD .  sodium chloride 0.9 % bolus 500 mL, 500 mL, Intravenous, Once, Wynetta Fines, MD, Last Rate: 491.8 mL/hr at 09/21/18 1301, 500 mL at 09/21/18 1301  Current Outpatient Medications:  .  amLODipine (NORVASC) 5 MG tablet, Take  1 tablet (5 mg total) by mouth daily., Disp: 30 tablet, Rfl: 11 .  aspirin 325 MG tablet, Take 1 tablet (325 mg total) by mouth daily., Disp: 90 tablet, Rfl: 0 .  atorvastatin (LIPITOR) 80 MG tablet, Take 1 tablet (80 mg total) by mouth daily at 6 PM., Disp: 30 tablet, Rfl: 0   Exam: Current vital signs: BP (!) 160/90 (BP Location: Right Arm)   Pulse (!) 108  Vital signs in last 24 hours: Pulse Rate:  [108] 108 (12/17 1240) BP: (160)/(90) 160/90 (12/17 1240)  Physical Exam  Constitutional: Appears well-developed and well-nourished.  Psych: Altered mental status Eyes: No scleral injection HENT: No OP obstrucion Head: Normocephalic.  Cardiovascular: Normal rate and regular rhythm.  Respiratory: Effort normal, non-labored breathing GI: Soft.  No distension. There is no tenderness.  Skin: WDI  Neuro: Mental Status: Patient is alert but very altered.  He is speaking with hypophonic voice.  He has delayed reaction.  He is following commands and answering questions appropraitely. Able to name objects and repeats sentences.    Cranial Nerves: II: VF grossly intact bilaterally III,IV, VI: EOMI without ptosis or diploplia. Pupils are equal, round, and reactive to light.   V: Facial sensation is symmetric to temperature VII: Facial movement is symmetric.  VIII: hearing is intact to voice X: Uvula elevates symmetrically XI: Shoulder shrug is symmetric. XII: tongue is midline without atrophy or fasciculations.  Motor: Bilateral leg weakness along with left arm and left  leg weakness greater than right. Sensory: Respond to noxious stimuli in all extremities Deep Tendon Reflexes: 2+ and symmetric in the biceps and patellae.  No Achilles deep tendon reflex Plantars: Toes are downgoing bilaterally.  Cerebellar: No gross ataxia with finger to nose.  Labs I have reviewed labs in epic and the results pertinent to this consultation are:   CBC    Component Value Date/Time   WBC 9.7  09/21/2018 1232   RBC 5.14 09/21/2018 1232   HGB 16.7 09/21/2018 1239   HCT 49.0 09/21/2018 1239   PLT 142 (L) 09/21/2018 1232   MCV 92.4 09/21/2018 1232   MCH 30.4 09/21/2018 1232   MCHC 32.8 09/21/2018 1232   RDW 14.4 09/21/2018 1232   LYMPHSABS 0.4 (L) 09/21/2018 1232   MONOABS 0.6 09/21/2018 1232   EOSABS 0.0 09/21/2018 1232   BASOSABS 0.0 09/21/2018 1232    CMP     Component Value Date/Time   NA 138 09/21/2018 1239   K 4.1 09/21/2018 1239   CL 101 09/21/2018 1239   CO2 21 (L) 11/20/2017 1948   GLUCOSE 86 09/21/2018 1239   BUN 12 09/21/2018 1239   CREATININE 1.30 (H) 09/21/2018 1239   CALCIUM 8.9 11/20/2017 1948   PROT 6.9 11/20/2017 1948   ALBUMIN 3.7 11/20/2017 1948   AST 17 11/20/2017 1948   ALT 12 (L) 11/20/2017 1948   ALKPHOS 86 11/20/2017 1948   BILITOT 0.8 11/20/2017 1948   GFRNONAA >60 11/21/2017 0448   GFRAA >60 11/21/2017 0448    Lipid Panel     Component Value Date/Time   CHOL 194 11/21/2017 0449   TRIG 69 11/21/2017 0449   HDL 55 11/21/2017 0449   CHOLHDL 3.5 11/21/2017 0449   VLDL 14 11/21/2017 0449   LDLCALC 125 (H) 11/21/2017 0449     Imaging I have reviewed the images obtained:  CT-scan of the brain-no acute findings at this time  MRI examination of the brain  Felicie Mornavid Smith PA-C [facility] [phone]  M-F  (9:00 am- 5:00 PM)  09/21/2018, 1:11 PM    NEUROHOSPITALIST ADDENDUM Performed a face to face diagnostic evaluation.   I have reviewed the contents of history and physical exam as documented by PA/ARNP/Resident and agree with above documentation.  I have discussed and formulated the  plan as documented below. Edits to the note have been made as needed.  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  When EMS arrived,  they noted no focal weakness however patient was not responding to any questions and was nonverbal.  Was noted to have temperature of 103 and was incontinent of urine.  Blood pressure was 180 /90 systolic.  Improved on arrival and was answering questions appropriately.  Appeared to have mild weakness on the  left side which patient states is chronic.  Does have old coronary radiata infarct on CT.  Lower suspicion of stroke but not completely ruled out.  Not a TPA candidate due to mild and improving symptoms.   Acute metabolic encephalopathy  D/D : Sepsis versus seizure versus acute ischemic stroke  Recommendations -MRI brain without contrast to evaluate for acute ischemic infarct -Continue EEG -Metabolic and infectious work-up -Avoid hypotension, BP goal greater than 140 SBP  Neurology will continue to follow up results.   Georgiana SpinnerSushanth Emogene Muratalla MD [facility] [phone]  If 7pm to 7am, please call on call as listed on AMION.

## 2018-09-21 NOTE — H&P (Addendum)
Family Medicine [facility] Admission History and Physical Service Pager: [phone]  Patient name: Austin Weaver Medical record number: [id number] Date of birth: 1946/04/28 Age: 72 y.o. Gender: male  Primary Care Provider: Patient, No Pcp Per Consultants: Neuro  Code Status: Full   Chief Complaint: AMS, found down    Assessment and Plan: Austin Weaver is a 72 y.o. male presenting after being found down by family in the bathroom, initially with slurred speech and extremity weakness on arrival, and also found to have a fever of 102.3 and tachycardic concerning for infection. PMH is significant for hypertension, history of TIA, and previous CVA.   Found down, possible syncopal event  AMS: Acute, Improving.  Family reportedly found pt on his bathroom floor, pt doesn't remember event. Presented to the ED via EMS as a code stroke 2/2 right-sided weakness, inability to respond to questions, and slurred speech. Evaluated by neurology, TPA not given as they had low concerns as CVA for precipitating etiology.  On our exam, febrile to 102.3, tachycardic to low 100s, hypertensive to 160s, satting well on RA. No focal neuro deficits noted and alert/oriented to person, place, but not time. Noticeable stutter, reports this is chronic. Unknown mental baseline. No bruising on exam or complaints of MSK pain.  CT head without acute intracranial findings or hemorrhage. Electrolytes, glucose, BUN WNL.  Hgb 15.6. CK normal at 107. ISTAT troponin 0.01. No valvular or EF abnormalities on echo in 11/2017. Uncertain etiology, differential remains broad including micturition associated syncope, cardiac arrhythmia, neurological/CVA, seizure, orthostatic hypotension,  infectious, metabolic abnormalities/medication overuse.  Could consider micturition associated syncope, especially as patient was found in the restroom. Cardiac arrhythmia also remains in the differential as patient is slightly tachycardic,  however reassured no history of arrhythmias and unremarkable cardiac exam.  Will continue to have concern for CVA vs TIA, as patient presented with slurred speech and extremity weakness, however would not suspect patient to be found unresponsive.  Could consider seizure, limited information for prior to finding patient, but initial presentation at ED may have represented a post ictal state. Lastly, could consider infectious etiology resulting in AMS, discussed as below. Unlikely metabolic as electrolytes, BUN, glucose WNL.  Unlikely medication overdose/drug use, patient denies these concerns. - Admit to telemetry, attending Dr. McDiarmid  - Neurology on board, will be obtaining a brain MRI and EEG  - Obtain EKG  - Troponin, trend as appropriate  - Mag, phos, vitamin B-1, B12 levels - UDS  - Orthostatic vitals - Ammonia, TSH, salicylate level - N.p.o., pending bedside swallow - Vitals per routine - PT/OT evaluation  3/4 SIRS Criteria  Uncertain Infectious Source:  Acute.  Pt meeting 3/4 SIRS criteria: Febrile 102.22F, HR 100s, RR mid 20s. No leukocytosis, LA 1.96.  Negative infectious ROS, noncontributory physical exam. Influenza PCR negative. U/a significant for pH 9 only. CXR with mild bilateral pulmonary interstitial markings may be chronic vs. viral respiratory infection.  Infectious presentation likely secondary to respiratory infection, however markings may be more in a chronic nature, and patient without pulmonary symptomatology.  Could consider UTI, as Proteus commonly can cause urine to become alkalotic, however would expect more signs on U/a if Proteus were present. Seizure also possible to elicit fever, in combination with presentation as above. Unlikely dermatological infection, no sacral ulcer or open wounds noted.  However, patient does have poorly maintained feet with a small 1/2cm growth-like mass in the center of his R sole, but with no surrounding erythema, swelling, or pain  with  palpation.  - Obtain 2 view CXR - Right foot x-ray - Start IV vancomycin and ceftriaxone - Follow-up blood and urine cultures - 125 mL/hour mIVF  -Trend LA - Monitor fever curve, vitals - Tylenol as needed for fever    AKI:  Cr 1.54 on admit, baseline appears around 1.0.  Likely prerenal etiology, as recent creatinine 1.3 after some fluid rehydration. -Monitor BMP - Avoid nephrotoxic medications -125 mL/hour NS  Asymmetric RL Edema:  Pt with 1+ pitting edema to mid shin on the right, appears larger than his left leg.  Nontender to palpation, no rash or discoloration, neurovascularly intact.  May be benign, however will rule out DVT. -Obtain right DVT ultrasound  Hypertension: Chronic, Stable. SBP ranging 130-160s following admit.  Takes amlodipine 5 mg at home. - Hold home amlodipine, in setting of potential work-up for CVA - Monitor BP - Labetalol PRN SBP >220, DBP >110   Previous CVA/TIA: Chronic, stable. CT head showing chronic lacunar infarct in L thalamus and R external capsule infarct.  Previously admitted in 11/2017 for additional work-up for TIA, started on aspirin 325 and atorvastatin 80 at that time. However reported not taking the atorvastatin. - MRI brain pending as above - Continue home aspirin 325 - Restart home atorvastatin 80 mg  FEN/GI: NPO, pending bedside swallow, 125 ml/hr NS  Prophylaxis: Lovenox   Disposition: Admit to telemetry, attending Dr. McDiarmid   History of Present Illness:  Austin Weaver is a 72 y.o. male with a past history of CVA and hypertension presenting after being found down by his family in his bathroom.  Patient is a poor historian and no family is present at bedside, majority of history is collected via chart review.  Patient states that earlier today he went to the pharmacy to get his medicines and he felt good.  After this, he does not remember anything and does not know how he got to the hospital.  He states he has had one previous  episode like this back in " the spring", was notably admitted for TIA back in February 2019.  Prior to today, he feels he has been in good health.  He denies any recent chest pain, HA, SOB, productive cough, fever, chills, abdominal pain, N/V, change in bowel movements, dysuria, and or any open skin wounds.  He has no known history of seizures or liver dysfunction.  Smokes 3-4 cigarettes/day, however denies any alcohol or illicit drug use.  Patient may have a primary care provider he sees, however is unable to provide a name.  Unsure of any precipitating events that may have occurred prior to finding patient down, or for how long he was down for.   EMS/ED Course: Per chart review, EMS was called when family found patient down.  He initially had left and right leg, left arm weakness with some slurring of speech via EMS evaluation and thus presented as a code stroke.  Also noted to be febrile to 103F per EMS.  Neurology was consulted, obtained a CT head that showed no acute intracranial findings or hemorrhage.  tPA was not given, believe presentation was not secondary to a stroke.  CXR obtained showing mild bilateral pulmonary interstitial markings that may represent viral/atypical respiratory infection.  Labs notable for LA 1.96, creatinine 1.54, WBC 9.7, hemoglobin 15.6, troponin 0.01, CK 107. U/a clear, remarkable for pH of 9.  While in the ED, he was given a 500 mL bolus and 1000 mg Tylenol.  Review Of Systems:  Per HPI with the following additions:   Review of Systems  Constitutional: Positive for fever. Negative for chills, malaise/fatigue and weight loss.  HENT: Negative for congestion and sore throat.   Respiratory: Negative for cough, hemoptysis, sputum production and shortness of breath.   Cardiovascular: Negative for chest pain, palpitations, orthopnea, leg swelling and PND.  Gastrointestinal: Negative for abdominal pain, constipation, diarrhea, nausea and vomiting.  Genitourinary: Negative for  dysuria, frequency and urgency.  Neurological: Positive for loss of consciousness. Negative for dizziness, tingling, tremors, focal weakness, seizures, weakness and headaches.    Patient Active Problem List   Diagnosis Date Noted  . Altered mental status, unspecified 09/21/2018  . AMS (altered mental status) 09/21/2018  . AKI (acute kidney injury) (HCC) 09/21/2018  . Febrile illness 09/21/2018  . SIRS (systemic inflammatory response syndrome) (HCC) 09/21/2018  . Fall 09/21/2018  . Abnormal CXR 09/21/2018  . Old Cerebral infarction (HCC) 09/21/2018    Past Medical History: Past Medical History:  Diagnosis Date  . Fall 09/21/2018  . History of transient ischemic attack (TIA) 11/20/2017  . History of transient ischemic attack (TIA) 11/20/2017  . Hypertension   . Old Cerebral infarction (HCC) 09/21/2018   Remote L. thalamic and R. external capsul & corona radiata infarcts    Past Surgical History: Past Surgical History:  Procedure Laterality Date  . laporotomy      Social History: Social History   Tobacco Use  . Smoking status: Current Every Day Smoker  . Smokeless tobacco: Never Used  Substance Use Topics  . Alcohol use: No    Frequency: Never  . Drug use: Not on file    Please also refer to relevant sections of EMR.  Family History: Family History  Problem Relation Age of Onset  . Hypertension Mother     Allergies and Medications: No Known Allergies No current facility-administered medications on file prior to encounter.    Current Outpatient Medications on File Prior to Encounter  Medication Sig Dispense Refill  . amLODipine (NORVASC) 5 MG tablet Take 1 tablet (5 mg total) by mouth daily. 30 tablet 11  . aspirin 325 MG tablet Take 1 tablet (325 mg total) by mouth daily. 90 tablet 0  . atorvastatin (LIPITOR) 80 MG tablet Take 1 tablet (80 mg total) by mouth daily at 6 PM. (Patient not taking: Reported on 09/21/2018) 30 tablet 0    Objective: BP 129/86    Pulse 84   Temp (!) 102.3 F (39.1 C) (Rectal)   Resp 15   Ht 5\' 6"  (1.676 m)   Wt 55.3 kg   SpO2 96%   BMI 19.69 kg/m  Exam: General: Alert, NAD, sitting comfortably in bed HEENT: NCAT, MM slightly dry, oropharynx nonerythematous, lateral TMs clear, reflective to light.  EOMI.  Cardiac: RRR no m/g/r Lungs: Clear bilaterally, no increased WOB on RA Abdomen: soft, non-tender, non-distended, normoactive BS Msk: Moves all extremities spontaneously  Ext: Warm, dry, 2+ distal pulses, +1 pitting edema on the right to mid shin Neuro: Alert, oriented to person, place, but not time.  Speech appropriate, stutter noted.  Able to follow commands, answers questions appropriately however does not know the answer to several.  CN II-XII intact.  Sensation intact bilateral upper and lower extremity.  5/5 bilateral upper extremity strength, 4/5 bilateral lower extremity strength. Psych: Appropriate affect Derm: No rashes or bruises noted, no open wounds noted including sacral region.  Bilateral feet severely dry with elongated, thickened/darkened nails.  Labs and Imaging: CBC BMET  Recent Labs  Lab 09/21/18 1232 09/21/18 1239  WBC 9.7  --   HGB 15.6 16.7  HCT 47.5 49.0  PLT 142*  --    Recent Labs  Lab 09/21/18 1232 09/21/18 1239  NA 137 138  K 4.1 4.1  CL 102 101  CO2 22  --   BUN 10 12  CREATININE 1.54* 1.30*  GLUCOSE 89 86  CALCIUM 9.2  --      Dg Tibia/fibula Right  Result Date: 09/21/2018 CLINICAL DATA:  Osteomyelitis to right lower leg. Hx of TIA, hypertension. EXAM: RIGHT TIBIA AND FIBULA - 2 VIEW COMPARISON:  None. FINDINGS: There is no evidence of fracture or other focal bone lesions. Soft tissues are unremarkable. No subcutaneous gas. Popliteal and tibial arterial calcifications. IMPRESSION: 1. No acute bone abnormality. 2. Arterial calcifications. Electronically Signed   By: Corlis Leak M.D.   On: 09/21/2018 17:26   Dg Chest Port 1 View  Result Date:  09/21/2018 CLINICAL DATA:  72 year old male with altered mental status and fever of 103. EXAM: PORTABLE CHEST 1 VIEW COMPARISON:  Report of chest radiographs 10/02/2003 (no images available). FINDINGS: Portable AP upright view at 1301 hours. Mildly low lung volumes. Mediastinal contours within normal limits. Visualized tracheal air column is within normal limits. No pneumothorax, pleural effusion or consolidation. Borderline to mild bilateral pulmonary interstitial markings appear slightly greater in the left lung. Negative visible bowel gas pattern. No acute osseous abnormality identified. IMPRESSION: Mild bilateral pulmonary interstitial markings are non-specific and could be chronic, but consider viral/atypical respiratory infection in this setting. Electronically Signed   By: Odessa Fleming M.D.   On: 09/21/2018 13:19   Ct Head Code Stroke Wo Contrast  Result Date: 09/21/2018 CLINICAL DATA:  Code stroke. Sudden onset mental status changes. Left arm and leg weakness. Right leg weakness. EXAM: CT HEAD WITHOUT CONTRAST TECHNIQUE: Contiguous axial images were obtained from the base of the skull through the vertex without intravenous contrast. COMPARISON:  None. FINDINGS: Brain: No evidence of acute infarction, hemorrhage, hydrocephalus, extra-axial collection or mass lesion/mass effect. Lacunar infarct in the left thalamus that has a chronic appearance on coronal reformats. Remote perforator infarct in the right external capsule and corona radiata. Ventriculomegaly attributed atrophy. Vascular: Extensive confluent atherosclerotic calcification. Skull: No acute finding. Sinuses/Orbits: Negative Other: These results were communicated to at 1:06 pmon 12/17/2019by text page via the [facility] messaging system. ASPECTS [facility] Stroke Program Early CT Score) -not scored with this history IMPRESSION: 1. No acute finding. 2. Chronic small vessel ischemia with remote small vessel infarcts. Electronically Signed   By: Marnee Spring M.D.   On: 09/21/2018 13:06    Allayne Stack, DO 09/21/2018, 5:42 PM PGY-1,  Family Medicine FPTS Intern pager: [phone], text pages welcome  Resident Attestation  I saw and evaluated the patient, performing the key elements of the service.I personally performed or re-performed the history, physical exam, and medical decision making activities of this service and have verified that the service and findings are accurately documented in the note.I developed the management plan that is described in the note, and I agree with the content, with my edits above in red.  Jules Schick, DO PGY-2, [facility] Family Medicine

## 2018-09-21 NOTE — Progress Notes (Signed)
Admitted patient skin is clear, tele on and CCMD called and tele verified, pt in bed side rails up, alarm on, orthastatics completed and urine collected, safety light to door is on

## 2018-09-21 NOTE — Progress Notes (Signed)
Pharmacy Antibiotic Note  Austin Weaver is a 72 y.o. male admitted on 09/21/2018 with altered mental status. Pharmacy has been consulted for vancomycin dosing. Pt is febrile with Tmax 102.3 but WBC is WNL. SCr is elevated at 1.54 which is above his baseline.   Plan: Vancomycin 1gm IV x 1 then 750mg  IV Q24H F/u renal fxn, C&S, clinical status and vanc peak/trough PRN  Height: 5\' 6"  (167.6 cm) Weight: 122 lb (55.3 kg) IBW/kg (Calculated) : 63.8  Temp (24hrs), Avg:102.3 F (39.1 C), Min:102.3 F (39.1 C), Max:102.3 F (39.1 C)  Recent Labs  Lab 09/21/18 1232 09/21/18 1239 09/21/18 1425  WBC 9.7  --   --   CREATININE 1.54* 1.30*  --   LATICACIDVEN  --   --  1.96*    Estimated Creatinine Clearance: 41.4 mL/min (A) (by C-G formula based on SCr of 1.3 mg/dL (H)).    No Known Allergies  Antimicrobials this admission: Vanc 12/17>> CTX 12/17>>  Dose adjustments this admission: N/A  Microbiology results: Pending  Thank you for allowing pharmacy to be a part of this patient's care.  Miqueas Whilden, Drake LeachRachel Lynn 09/21/2018 4:40 PM

## 2018-09-21 NOTE — ED Provider Notes (Signed)
[facility] EMERGENCY DEPARTMENT Provider Note   CSN: [id number] Arrival date & time: 09/21/18  1229   An emergency department physician performed an initial assessment on this suspected stroke patient at 1250.  History   Chief Complaint Chief Complaint  Patient presents with  . Code Stroke    HPI Austin Weaver is a 72 y.o. male.  72 year old male with prior medical history as detailed below presents for evaluation of altered mental status.  Patient arrives by EMS as a code stroke.  Patient was reportedly found in his residence on the bathroom floor.  It is unclear as to exactly what events led to him being found on the floor.  He was evaluated as a code stroke.  Neurology has deemed him to not be a candidate for TPA.  CT head did not demonstrate acute hemorrhage.  Patient is noted to be febrile upon the code stroke team's initial evaluation.  Patient is pleasantly confused upon my evaluation.  He denies any specific complaint.  He is unsure as to exactly why he is at the hospital.  Level 5 caveat secondary to patient's condition.  The history is provided by the patient, medical records and the EMS personnel.    Past Medical History:  Diagnosis Date  . Hypertension     Patient Active Problem List   Diagnosis Date Noted  . Hypertensive urgency 11/21/2017  . TIA (transient ischemic attack) 11/20/2017    Past Surgical History:  Procedure Laterality Date  . laporotomy          Home Medications    Prior to Admission medications   Medication Sig Start Date End Date Taking? Authorizing Provider  amLODipine (NORVASC) 5 MG tablet Take 1 tablet (5 mg total) by mouth daily. 11/22/17 11/22/18  Rolly Salter, MD  aspirin 325 MG tablet Take 1 tablet (325 mg total) by mouth daily. 11/23/17   Rolly Salter, MD  atorvastatin (LIPITOR) 80 MG tablet Take 1 tablet (80 mg total) by mouth daily at 6 PM. 11/22/17   Rolly Salter, MD    Family History Family  History  Problem Relation Age of Onset  . Hypertension Mother     Social History Social History   Tobacco Use  . Smoking status: Current Every Day Smoker  . Smokeless tobacco: Never Used  Substance Use Topics  . Alcohol use: No    Frequency: Never  . Drug use: Not on file     Allergies   Patient has no known allergies.   Review of Systems Review of Systems  All other systems reviewed and are negative.    Physical Exam Updated Vital Signs BP (!) 135/91   Pulse (!) 101   Temp (!) 102.3 F (39.1 C) (Rectal)   Resp 18   SpO2 97%   Physical Exam Vitals signs and nursing note reviewed.  Constitutional:      General: He is not in acute distress.    Appearance: Normal appearance. He is well-developed.  HENT:     Head: Normocephalic and atraumatic.  Eyes:     Conjunctiva/sclera: Conjunctivae normal.     Pupils: Pupils are equal, round, and reactive to light.  Neck:     Musculoskeletal: Normal range of motion and neck supple.  Cardiovascular:     Rate and Rhythm: Normal rate and regular rhythm.     Heart sounds: Normal heart sounds.  Pulmonary:     Effort: Pulmonary effort is normal. No respiratory distress.  Breath sounds: Normal breath sounds.  Abdominal:     General: There is no distension.     Palpations: Abdomen is soft.     Tenderness: There is no abdominal tenderness.  Musculoskeletal: Normal range of motion.        General: No deformity.  Skin:    General: Skin is warm and dry.  Neurological:     General: No focal deficit present.     Mental Status: He is alert.      ED Treatments / Results  Labs (all labs ordered are listed, but only abnormal results are displayed) Labs Reviewed  CBC - Abnormal; Notable for the following components:      Result Value   Platelets 142 (*)    All other components within normal limits  DIFFERENTIAL - Abnormal; Notable for the following components:   Neutro Abs 8.5 (*)    Lymphs Abs 0.4 (*)    All other  components within normal limits  COMPREHENSIVE METABOLIC PANEL - Abnormal; Notable for the following components:   Creatinine, Ser 1.54 (*)    GFR calc non Af Amer 45 (*)    GFR calc Af Amer 52 (*)    All other components within normal limits  URINALYSIS, ROUTINE W REFLEX MICROSCOPIC - Abnormal; Notable for the following components:   pH 9.0 (*)    All other components within normal limits  I-STAT CHEM 8, ED - Abnormal; Notable for the following components:   Creatinine, Ser 1.30 (*)    Calcium, Ion 1.11 (*)    All other components within normal limits  I-STAT CG4 LACTIC ACID, ED - Abnormal; Notable for the following components:   Lactic Acid, Venous 1.96 (*)    All other components within normal limits  CULTURE, BLOOD (ROUTINE X 2)  CULTURE, BLOOD (ROUTINE X 2)  PROTIME-INR  APTT  CK  INFLUENZA PANEL BY PCR (TYPE A & B)  I-STAT TROPONIN, ED  CBG MONITORING, ED  I-STAT CG4 LACTIC ACID, ED    EKG EKG Interpretation  Date/Time:  Tuesday September 21 2018 12:55:24 EST Ventricular Rate:  117 PR Interval:    QRS Duration: 84 QT Interval:  277 QTC Calculation: 387 R Axis:   19 Text Interpretation:  Sinus tachycardia Probable left atrial enlargement Abnormal T, consider ischemia, diffuse leads Confirmed by Kristine Royal [phone]) on 09/21/2018 12:57:39 PM   Radiology Dg Chest Port 1 View  Result Date: 09/21/2018 CLINICAL DATA:  72 year old male with altered mental status and fever of 103. EXAM: PORTABLE CHEST 1 VIEW COMPARISON:  Report of chest radiographs 10/02/2003 (no images available). FINDINGS: Portable AP upright view at 1301 hours. Mildly low lung volumes. Mediastinal contours within normal limits. Visualized tracheal air column is within normal limits. No pneumothorax, pleural effusion or consolidation. Borderline to mild bilateral pulmonary interstitial markings appear slightly greater in the left lung. Negative visible bowel gas pattern. No acute osseous abnormality  identified. IMPRESSION: Mild bilateral pulmonary interstitial markings are non-specific and could be chronic, but consider viral/atypical respiratory infection in this setting. Electronically Signed   By: Odessa Fleming M.D.   On: 09/21/2018 13:19   Ct Head Code Stroke Wo Contrast  Result Date: 09/21/2018 CLINICAL DATA:  Code stroke. Sudden onset mental status changes. Left arm and leg weakness. Right leg weakness. EXAM: CT HEAD WITHOUT CONTRAST TECHNIQUE: Contiguous axial images were obtained from the base of the skull through the vertex without intravenous contrast. COMPARISON:  None. FINDINGS: Brain: No evidence of acute infarction, hemorrhage,  hydrocephalus, extra-axial collection or mass lesion/mass effect. Lacunar infarct in the left thalamus that has a chronic appearance on coronal reformats. Remote perforator infarct in the right external capsule and corona radiata. Ventriculomegaly attributed atrophy. Vascular: Extensive confluent atherosclerotic calcification. Skull: No acute finding. Sinuses/Orbits: Negative Other: These results were communicated to at 1:06 pmon 12/17/2019by text page via the [facility] messaging system. ASPECTS [facility]([place] Stroke Program Early CT Score) -not scored with this history IMPRESSION: 1. No acute finding. 2. Chronic small vessel ischemia with remote small vessel infarcts. Electronically Signed   By: Marnee SpringJonathon  Watts M.D.   On: 09/21/2018 13:06    Procedures Procedures (including critical care time)  Medications Ordered in ED Medications  sodium chloride 0.9 % bolus 500 mL (500 mLs Intravenous New Bag/Given 09/21/18 1301)  acetaminophen (TYLENOL) tablet 1,000 mg (has no administration in time range)     Initial Impression / Assessment and Plan / ED Course  I have reviewed the triage vital signs and the nursing notes.  Pertinent labs & imaging results that were available during my care of the patient were reviewed by me and considered in my medical decision making (see chart  for details).     MDM  Screen complete  Patient is presenting for evaluation of weakness.  Patient was initially seen as a code stroke per EMS activation of same.  Neurology did not feel that the patient was exhibiting symptoms of CVA.  Patient did not meet criteria for TPA.  EMS did report that the patient was febrile on their initial evaluation.  No fever was recorded in the ED.  Patient's mental status has improved back to his baseline.  Labs do not reflect significant pathology otherwise.  Chest x-ray is somewhat suggestive of a possible viral infection.  Case discussed with medicine service and they will evaluate for admission.  I feel the patient would benefit from overnight observation.  His symptomatology may be secondary to a viral infection.  Final Clinical Impressions(s) / ED Diagnoses   Final diagnoses:  Weakness    ED Discharge Orders    None       Wynetta FinesMessick, Peter C, MD 09/21/18 [phone]

## 2018-09-22 ENCOUNTER — Inpatient Hospital Stay (HOSPITAL_COMMUNITY): Payer: Medicare Other

## 2018-09-22 ENCOUNTER — Encounter (HOSPITAL_COMMUNITY): Payer: Self-pay | Admitting: General Practice

## 2018-09-22 DIAGNOSIS — A4150 Gram-negative sepsis, unspecified: Secondary | ICD-10-CM

## 2018-09-22 DIAGNOSIS — W19XXXD Unspecified fall, subsequent encounter: Secondary | ICD-10-CM

## 2018-09-22 DIAGNOSIS — M7989 Other specified soft tissue disorders: Secondary | ICD-10-CM

## 2018-09-22 DIAGNOSIS — Z8673 Personal history of transient ischemic attack (TIA), and cerebral infarction without residual deficits: Secondary | ICD-10-CM

## 2018-09-22 DIAGNOSIS — A4151 Sepsis due to Escherichia coli [E. coli]: Secondary | ICD-10-CM | POA: Diagnosis present

## 2018-09-22 DIAGNOSIS — A4152 Sepsis due to Pseudomonas: Secondary | ICD-10-CM | POA: Diagnosis present

## 2018-09-22 DIAGNOSIS — I639 Cerebral infarction, unspecified: Secondary | ICD-10-CM

## 2018-09-22 DIAGNOSIS — E538 Deficiency of other specified B group vitamins: Secondary | ICD-10-CM | POA: Diagnosis present

## 2018-09-22 LAB — BLOOD CULTURE ID PANEL (REFLEXED)
Acinetobacter baumannii: NOT DETECTED
CANDIDA KRUSEI: NOT DETECTED
Candida albicans: NOT DETECTED
Candida glabrata: NOT DETECTED
Candida parapsilosis: NOT DETECTED
Candida tropicalis: NOT DETECTED
Carbapenem resistance: NOT DETECTED
Enterobacter cloacae complex: NOT DETECTED
Enterobacteriaceae species: DETECTED — AB
Enterococcus species: NOT DETECTED
Escherichia coli: DETECTED — AB
Haemophilus influenzae: NOT DETECTED
KLEBSIELLA OXYTOCA: NOT DETECTED
Klebsiella pneumoniae: NOT DETECTED
Listeria monocytogenes: NOT DETECTED
Neisseria meningitidis: NOT DETECTED
Proteus species: NOT DETECTED
Pseudomonas aeruginosa: DETECTED — AB
SERRATIA MARCESCENS: NOT DETECTED
Staphylococcus aureus (BCID): NOT DETECTED
Staphylococcus species: NOT DETECTED
Streptococcus agalactiae: NOT DETECTED
Streptococcus pneumoniae: NOT DETECTED
Streptococcus pyogenes: NOT DETECTED
Streptococcus species: NOT DETECTED

## 2018-09-22 LAB — CBC
HCT: 42.5 % (ref 39.0–52.0)
Hemoglobin: 14.6 g/dL (ref 13.0–17.0)
MCH: 31.1 pg (ref 26.0–34.0)
MCHC: 34.4 g/dL (ref 30.0–36.0)
MCV: 90.6 fL (ref 80.0–100.0)
Platelets: 131 10*3/uL — ABNORMAL LOW (ref 150–400)
RBC: 4.69 MIL/uL (ref 4.22–5.81)
RDW: 14.4 % (ref 11.5–15.5)
WBC: 9.9 10*3/uL (ref 4.0–10.5)
nRBC: 0 % (ref 0.0–0.2)

## 2018-09-22 LAB — COMPREHENSIVE METABOLIC PANEL
ALT: 16 U/L (ref 0–44)
AST: 23 U/L (ref 15–41)
Albumin: 3.5 g/dL (ref 3.5–5.0)
Alkaline Phosphatase: 64 U/L (ref 38–126)
Anion gap: 8 (ref 5–15)
BUN: 10 mg/dL (ref 8–23)
CALCIUM: 8.6 mg/dL — AB (ref 8.9–10.3)
CO2: 26 mmol/L (ref 22–32)
CREATININE: 1.39 mg/dL — AB (ref 0.61–1.24)
Chloride: 105 mmol/L (ref 98–111)
GFR calc non Af Amer: 51 mL/min — ABNORMAL LOW (ref >60)
GFR, EST AFRICAN AMERICAN: 59 mL/min — AB (ref >60)
Glucose, Bld: 96 mg/dL (ref 70–99)
Potassium: 4.3 mmol/L (ref 3.5–5.1)
Sodium: 139 mmol/L (ref 135–145)
Total Bilirubin: 1.1 mg/dL (ref 0.3–1.2)
Total Protein: 6.8 g/dL (ref 6.5–8.1)

## 2018-09-22 LAB — T4, FREE: Free T4: 1.1 ng/dL (ref 0.82–1.77)

## 2018-09-22 LAB — LACTIC ACID, PLASMA
Lactic Acid, Venous: 1.4 mmol/L (ref 0.5–1.9)
Lactic Acid, Venous: 1.8 mmol/L (ref 0.5–1.9)

## 2018-09-22 LAB — HIV ANTIBODY (ROUTINE TESTING W REFLEX): HIV Screen 4th Generation wRfx: NONREACTIVE

## 2018-09-22 MED ORDER — SODIUM CHLORIDE 0.9 % IV SOLN
2.0000 g | INTRAVENOUS | Status: DC
  Administered 2018-09-22 – 2018-09-24 (×3): 2 g via INTRAVENOUS
  Filled 2018-09-22 (×3): qty 2

## 2018-09-22 MED ORDER — CYANOCOBALAMIN 1000 MCG/ML IJ SOLN
1000.0000 ug | Freq: Once | INTRAMUSCULAR | Status: AC
  Administered 2018-09-22: 1000 ug via INTRAMUSCULAR
  Filled 2018-09-22: qty 1

## 2018-09-22 MED ORDER — MAGNESIUM SULFATE 2 GM/50ML IV SOLN
2.0000 g | Freq: Once | INTRAVENOUS | Status: AC
  Administered 2018-09-22: 2 g via INTRAVENOUS
  Filled 2018-09-22: qty 50

## 2018-09-22 NOTE — Evaluation (Signed)
Physical Therapy Evaluation Patient Details Name: Austin Weaver MRN: [id number] DOB: December 31, 1945 Today's Date: 09/22/2018   History of Present Illness  Austin Weaver is a 72 y.o. male presenting after being found down by family in the bathroom, initially with slurred speech and extremity weakness on arrival, and also found to have a fever of 102.3 and tachycardic concerning for infection. PMH is significant for hypertension, history of TIA, and previous CVA.   Clinical Impression  Pt admitted with/for the incident stated above.  Pt needing minimal assist at this time for mobility and shows to by dyspneic and have an elevated heart rate with activity.  Pt currently limited functionally due to the problems listed. ( See problems list.)   Pt will benefit from PT to maximize function and safety in order to get ready for next venue listed below.     Follow Up Recommendations SNF;Other (comment)(depending on the progress)    Equipment Recommendations  Other (comment)(TBA)    Recommendations for Other Services       Precautions / Restrictions Precautions Precautions: Fall Restrictions Weight Bearing Restrictions: No      Mobility  Bed Mobility Overal bed mobility: Needs Assistance Bed Mobility: Supine to Sit     Supine to sit: Min assist;Mod assist     General bed mobility comments: assist to support trunk while pt scoots hips towards EOB, some assist for scooting hips; pt requires increased time/effort to perform and with increased dyspnea after task completion, required seated rest break prior to mobility  Transfers Overall transfer level: Needs assistance Equipment used: 2 person hand held assist Transfers: Sit to/from Stand Sit to Stand: Min assist;+2 physical assistance;+2 safety/equipment         General transfer comment: assist to rise and steady in standing; pt requires consistent steadying assist throughout mobility and standing activity    Ambulation/Gait Ambulation/Gait assistance: Min assist;+2 physical assistance Gait Distance (Feet): 10 Feet(x2) Assistive device: 1 person hand held assist;2 person hand held assist Gait Pattern/deviations: Step-through pattern Gait velocity: slow Gait velocity interpretation: <1.31 ft/sec, indicative of household ambulator General Gait Details: short unsteady steps with notable dyspnea and EHR upt to 115 bpm  Stairs            Wheelchair Mobility    Modified Rankin (Stroke Patients Only)       Balance Overall balance assessment: Needs assistance Sitting-balance support: Feet supported Sitting balance-Leahy Scale: Fair Sitting balance - Comments: at least minguard assist for static balance   Standing balance support: During functional activity;Single extremity supported;Bilateral upper extremity supported Standing balance-Leahy Scale: Poor Standing balance comment: reliant on UE support/external assist; witness during standing activity at the sink, pt needing external assist                             Pertinent Vitals/Pain Pain Assessment: No/denies pain    Home Living Family/patient expects to be discharged to:: Private residence Living Arrangements: Other relatives(cousin) Available Help at Discharge: Family;Available 24 hours/day Type of Home: Apartment Home Access: Level entry     Home Layout: One level Home Equipment: Shower seat;Grab bars - tub/shower;Bedside commode;Walker - 2 wheels;Cane - single point      Prior Function Level of Independence: Independent         Comments: pt reports he was driving     Hand Dominance   Dominant Hand: Right    Extremity/Trunk Assessment   Upper Extremity Assessment Upper Extremity Assessment: Generalized weakness  Lower Extremity Assessment Lower Extremity Assessment: Generalized weakness       Communication   Communication: Expressive difficulties  Cognition Arousal/Alertness:  Awake/alert Behavior During Therapy: WFL for tasks assessed/performed Overall Cognitive Status: No family/caregiver present to determine baseline cognitive functioning                                 General Comments: disoriented to day of week, month, able to state most recent holiday correctly (Thanksgiving) and with assist he was able to problem solve current month; oriented to location and self; overall following commands given increased time and intermittent cues      General Comments General comments (skin integrity, edema, etc.): up to 116 with activity; DOE 3/4 with room level activity though SpO2 96% on RA    Exercises     Assessment/Plan    PT Assessment Patient needs continued PT services  PT Problem List Decreased strength;Decreased activity tolerance;Decreased balance;Decreased mobility;Decreased coordination;Cardiopulmonary status limiting activity       PT Treatment Interventions DME instruction;Gait training;Functional mobility training;Therapeutic activities;Balance training;Patient/family education    PT Goals (Current goals can be found in the Care Plan section)  Acute Rehab PT Goals Patient Stated Goal: regain strength/independence PT Goal Formulation: With patient Time For Goal Achievement: 10/06/18 Potential to Achieve Goals: Good    Frequency Min 3X/week   Barriers to discharge        Co-evaluation PT/OT/SLP Co-Evaluation/Treatment: Yes Reason for Co-Treatment: For patient/therapist safety   OT goals addressed during session: ADL's and self-care       AM-PAC PT "6 Clicks" Mobility  Outcome Measure Help needed turning from your back to your side while in a flat bed without using bedrails?: A Little Help needed moving from lying on your back to sitting on the side of a flat bed without using bedrails?: A Little Help needed moving to and from a bed to a chair (including a wheelchair)?: None Help needed standing up from a chair using  your arms (e.g., wheelchair or bedside chair)?: None Help needed to walk in hospital room?: A Little Help needed climbing 3-5 steps with a railing? : A Lot 6 Click Score: 19    End of Session   Activity Tolerance: Patient limited by fatigue Patient left: in chair;with call bell/phone within reach;with chair alarm set Nurse Communication: Mobility status PT Visit Diagnosis: Unsteadiness on feet (R26.81);Muscle weakness (generalized) (M62.81)    Time: [id number] PT Time Calculation (min) (ACUTE ONLY): 26 min   Charges:   PT Evaluation $PT Eval Moderate Complexity: 1 Mod          09/22/2018  Concord BingKen Anquan Azzarello, PT Acute Rehabilitation Services [phone]  (pager) [phone]  (office)  Austin Weaver 09/22/2018, 5:56 PM

## 2018-09-22 NOTE — Progress Notes (Signed)
PHARMACY - PHYSICIAN COMMUNICATION CRITICAL VALUE ALERT - BLOOD CULTURE IDENTIFICATION (BCID)  Austin Weaver is an 72 y.o. male who [facility] with a chief complaint of altered mental status  Name of physician (or Provider) Contacted: Dr. Karen ChafeLockamy (FMTS)  Current antibiotics: Vancomycin/Ceftriaxone  Changes to prescribed antibiotics recommended:  Change ceftriaxone to Cefepime  Results for orders placed or performed during the hospital encounter of 09/21/18  Blood Culture ID Panel (Reflexed) (Collected: 09/21/2018  1:41 PM)  Result Value Ref Range   Enterococcus species NOT DETECTED NOT DETECTED   Listeria monocytogenes NOT DETECTED NOT DETECTED   Staphylococcus species NOT DETECTED NOT DETECTED   Staphylococcus aureus (BCID) NOT DETECTED NOT DETECTED   Streptococcus species NOT DETECTED NOT DETECTED   Streptococcus agalactiae NOT DETECTED NOT DETECTED   Streptococcus pneumoniae NOT DETECTED NOT DETECTED   Streptococcus pyogenes NOT DETECTED NOT DETECTED   Acinetobacter baumannii NOT DETECTED NOT DETECTED   Enterobacteriaceae species DETECTED (A) NOT DETECTED   Enterobacter cloacae complex NOT DETECTED NOT DETECTED   Escherichia coli DETECTED (A) NOT DETECTED   Klebsiella oxytoca NOT DETECTED NOT DETECTED   Klebsiella pneumoniae NOT DETECTED NOT DETECTED   Proteus species NOT DETECTED NOT DETECTED   Serratia marcescens NOT DETECTED NOT DETECTED   Carbapenem resistance NOT DETECTED NOT DETECTED   Haemophilus influenzae NOT DETECTED NOT DETECTED   Neisseria meningitidis NOT DETECTED NOT DETECTED   Pseudomonas aeruginosa DETECTED (A) NOT DETECTED   Candida albicans NOT DETECTED NOT DETECTED   Candida glabrata NOT DETECTED NOT DETECTED   Candida krusei NOT DETECTED NOT DETECTED   Candida parapsilosis NOT DETECTED NOT DETECTED   Candida tropicalis NOT DETECTED NOT DETECTED    Abran DukeLedford, Jackob Crookston 09/22/2018  5:56 AM

## 2018-09-22 NOTE — Progress Notes (Signed)
Right lower extremity venous duplex has been completed. Preliminary results can be found in CV Proc through chart review.   09/22/18 11:56 AM Olen CordialGreg Allyn Bartelson RVT

## 2018-09-22 NOTE — Plan of Care (Signed)
  Problem: Clinical Measurements: Goal: Ability to maintain clinical measurements within normal limits will improve 09/22/2018 0432 by Ashley RoyaltyEdoh, Kavonte Bearse I, RN Outcome: Progressing 09/22/2018 0421 by Azzie RoupEdoh, Kuzey Ogata I, RN Outcome: Progressing   Problem: Clinical Measurements: Goal: Will remain free from infection Outcome: Progressing   Problem: Activity: Goal: Risk for activity intolerance will decrease Outcome: Progressing

## 2018-09-22 NOTE — Progress Notes (Signed)
Family Medicine Teaching Service Daily Progress Note Intern Pager: [phone]  Patient name: Austin Weaver Medical record number: [id number] Date of birth: 1946-04-02 Age: 72 y.o. Gender: male  Primary Care Provider: Patient, No Pcp Per Consultants: Neuro Code Status: Full  Assessment and Plan: Austin Weaver is a 72 y.o. male presenting after being found down by family in the bathroom, initially with slurred speech and extremity weakness on arrival, and also found to have a fever of 102.3 and tachycardic concerning for infection. PMH is significant for hypertension, history of TIA, and previous CVA.   Found down, possible syncopal event  AMS: Acute, Improving.  Alert, oriented to person place, but not time, similar to yesterday's examination.  No focal neurological deficits on exam.  VSS, has not been febrile since 12/17 at 1300, orthostatic vitals WNL. Echo with EF 55-60%, normal wall motion, no significant valvular abnormalities.  UDS negative.  Electrolytes WNL, including mag and phos.  Ammonia 32.  TSH low at 0.264.  Salicylate <7.0.  B12 low at 108.  Troponin 0.03.  Etiology unclear, may be secondary to bacteremia as below, vs B12 deficiency vs micturition syncopal event, vs seizure, vs CVA.  Will try to reach out to family today to obtain further history. -Neurology on board, will follow-up if obtaining MRI/EEG -Monitor EKG -Follow-up vitamin B1 level -PT/OT evaluation -Treating bacteremia as below -Vitals per routine -Replete vitamin B12 as below  Sepsis 2/2 gram negative rod bacteremia:  Acute.  No longer afebrile since yesterday at 1300.  Asymptomatic, physical exam unremarkable.  Blood culture growing Enterobacteriaceae, E. coli, and Pseudomonas.  2 view CXR without acute cardiopulmonary disease.  LA now WNL.  Suspect gram-negative rods likely coming from urine, will follow up on urine culture, however interesting that UA only positive for alkalotic pH.  Switched to cefepime  overnight to cover for Pseudomonas. - D/C IV vancomycin, continue IV cefepime -Continue to monitor blood and urine cultures -Continue 125 mL/hour fluids - Monitor fever curve, vitals -Tylenol as needed for fever  B12 deficiency:  B12 108. Potentially contributing to initial presentation as above. - Replete with 1000 mcg B12 injection weekly   AKI:  Cr 1.39, 1.54 on admit, baseline appears around 1.0.  Likely prerenal etiology. -Monitor BMP - Avoid nephrotoxic medications -125 mL/hour NS  Asymmetric RL Edema:  Unchanged exam from yesterday, continued asymmetry. May be benign, however will rule out DVT. -Right DVT ultrasound pending   Hypertension: Chronic, Stable. SBP ranging 130-150s following admit.  Takes amlodipine 5 mg at home. - Hold home amlodipine, in setting of potential work-up for CVA  - Monitor BP - Labetalol PRN SBP >220, DBP >110   Previous CVA/TIA: Chronic, stable. CT head showing chronic lacunar infarct in L thalamus and R external capsule infarct. - MRI brain pending as above - Continue home aspirin 325 - Cont home atorvastatin 80 mg  FEN/GI: Passed swallow study > heart healthy diet, 125 ml/hr NS  Prophylaxis: Lovenox   Disposition: Continue care for bacteremia  Subjective:  Doing well this morning, no complaints.  States he is doing well denies any chest pain, shortness of breath, abdominal pain, nausea/vomiting, no weakness, or numbness/tingling.  Objective: Temp:  [98.2 F (36.8 C)-102.3 F (39.1 C)] 98.2 F (36.8 C) (12/18 0331) Pulse Rate:  [71-108] 99 (12/18 0331) Resp:  [15-21] 18 (12/18 0331) BP: (129-163)/(77-94) 131/78 (12/18 0331) SpO2:  [95 %-98 %] 98 % (12/18 0331) Weight:  [55.3 kg] 55.3 kg (12/17 1609) Physical Exam: General: Alert, NAD  HEENT: NCAT, MM dry  Cardiac: RRR no m/g/r Lungs: Clear bilaterally, no increased WOB  Abdomen: soft, non-tender, non-distended, normoactive BS Msk: Moves all extremities spontaneously  Ext:  Warm, dry, 2+ distal pulses, no edema with exception of minimal-+1 pitting edema to mid shin of the right lower extremity. Neuro: Alert and oriented to person and place, not time.  EOMI.  No focal neuro deficits on exam.  Laboratory: Recent Labs  Lab 09/21/18 1232 09/21/18 1239 09/21/18 1857 09/22/18 0235  WBC 9.7  --  14.6* 9.9  HGB 15.6 16.7 14.9 14.6  HCT 47.5 49.0 45.0 42.5  PLT 142*  --  152 131*   Recent Labs  Lab 09/21/18 1232 09/21/18 1239 09/21/18 1857 09/22/18 0235  NA 137 138  --  139  K 4.1 4.1  --  4.3  CL 102 101  --  105  CO2 22  --   --  26  BUN 10 12  --  10  CREATININE 1.54* 1.30* 1.50*  1.45* 1.39*  CALCIUM 9.2  --   --  8.6*  PROT 7.5  --   --  6.8  BILITOT 1.2  --   --  1.1  ALKPHOS 75  --   --  64  ALT 17  --   --  16  AST 21  --   --  23  GLUCOSE 89 86  --  96    Imaging/Diagnostic Tests: Dg Chest 2 View  Result Date: 09/22/2018 CLINICAL DATA:  Fever EXAM: CHEST - 2 VIEW COMPARISON:  09/21/2018 FINDINGS: Heart is borderline in size. No confluent airspace opacities, effusions or edema. No acute bony abnormality. IMPRESSION: No active cardiopulmonary disease. Electronically Signed   By: Charlett NoseKevin  Dover M.D.   On: 09/22/2018 09:57   Dg Tibia/fibula Right  Result Date: 09/21/2018 CLINICAL DATA:  Osteomyelitis to right lower leg. Hx of TIA, hypertension. EXAM: RIGHT TIBIA AND FIBULA - 2 VIEW COMPARISON:  None. FINDINGS: There is no evidence of fracture or other focal bone lesions. Soft tissues are unremarkable. No subcutaneous gas. Popliteal and tibial arterial calcifications. IMPRESSION: 1. No acute bone abnormality. 2. Arterial calcifications. Electronically Signed   By: Corlis Leak  Hassell M.D.   On: 09/21/2018 17:26    Allayne StackBeard,  N, DO 09/22/2018, 6:58 AM PGY-1, [facility] FPTS Intern pager: [phone], text pages welcome

## 2018-09-22 NOTE — Evaluation (Signed)
Occupational Therapy Evaluation Patient Details Name: Austin Weaver Heard MRN: [id number] DOB: [birth year] Today's Date: 09/22/2018    History of Present Illness Austin Weaver is a 72 y.o. male presenting after being found down by family in the bathroom, initially with slurred speech and extremity weakness on arrival, and also found to have a fever of 102.3 and tachycardic concerning for infection. PMH is significant for hypertension, history of TIA, and previous CVA.    Clinical Impression   This 72 y/o male presents with the above. At baseline pt reports he is independent with functional mobility and ADLs, lives with his cousin. Pt presenting with generalized weakness, decreased activity tolerance and cognitive impairments. Pt mildly confused/disoriented this session, overall following one step commands given increased time and cues intermittently. Pt requiring minA+2 (HHA) for room level functional mobility. He currently requires minguard-minA for UB ADL, maxA for LB ADLs. Pt notably fatigued with room level activity with DOE 3/4; max HR noted 116 and SpO2 96% on RA. Pt is pleasant and motivated to work with therapy to return to PLOF. He will benefit from continued acute OT services and recommend follow up therapy services in SNF setting after discharge to maximize his safety and independence with ADLs and mobility. Will follow.     Follow Up Recommendations  SNF;Supervision/Assistance - 24 hour(pending pt progress)    Equipment Recommendations  3 in 1 bedside commode;Other (comment)(TBD in next venue)           Precautions / Restrictions Precautions Precautions: Fall Restrictions Weight Bearing Restrictions: No      Mobility Bed Mobility Overal bed mobility: Needs Assistance Bed Mobility: Supine to Sit     Supine to sit: Min assist;Mod assist     General bed mobility comments: assist to support trunk while pt scoots hips towards EOB, some assist for scooting hips; pt  requires increased time/effort to perform and with increased dyspnea after task completion, required seated rest break prior to mobility  Transfers Overall transfer level: Needs assistance Equipment used: 2 person hand held assist Transfers: Sit to/from Stand Sit to Stand: Min assist;+2 physical assistance;+2 safety/equipment         General transfer comment: assist to rise and steady in standing; pt requires consistent steadying assist throughout mobility and standing activity     Balance Overall balance assessment: Needs assistance Sitting-balance support: Feet supported Sitting balance-Leahy Scale: Fair Sitting balance - Comments: at least minguard assist for static balance   Standing balance support: During functional activity;Single extremity supported;Bilateral upper extremity supported Standing balance-Leahy Scale: Poor Standing balance comment: reliant on UE support/external assist                           ADL either performed or assessed with clinical judgement   ADL Overall ADL's : Needs assistance/impaired Eating/Feeding: Supervision/ safety;Sitting   Grooming: Minimal assistance;Wash/dry face;Standing Grooming Details (indicate cue type and reason): minA+2 this session; pt requires consistent minA for standing balance is he unsteady on his feet Upper Body Bathing: Min guard;Sitting   Lower Body Bathing: Moderate assistance;Sit to/from stand   Upper Body Dressing : Min guard;Sitting   Lower Body Dressing: Moderate assistance;Maximal assistance;Sit to/from stand Lower Body Dressing Details (indicate cue type and reason): minA+2 for standing balance Toilet Transfer: Minimal assistance;+2 for physical assistance;+2 for safety/equipment;Ambulation Toilet Transfer Details (indicate cue type and reason): simulated in room level mobility, transfer to recliner Toileting- Clothing Manipulation and Hygiene: Maximal assistance;Sit to/from stand  Functional mobility during ADLs: Minimal assistance;+2 for physical assistance;+2 for safety/equipment(HHA) General ADL Comments: pt with increased weakness and fatigue with activity, fatigues quickly; dyspnea 3/4 with activity     Vision Baseline Vision/History: Wears glasses Wears Glasses: Reading only       Perception     Praxis      Pertinent Vitals/Pain Pain Assessment: No/denies pain     Hand Dominance Right   Extremity/Trunk Assessment Upper Extremity Assessment Upper Extremity Assessment: Generalized weakness   Lower Extremity Assessment Lower Extremity Assessment: Defer to PT evaluation       Communication Communication Communication: Expressive difficulties   Cognition Arousal/Alertness: Awake/alert Behavior During Therapy: WFL for tasks assessed/performed Overall Cognitive Status: No family/caregiver present to determine baseline cognitive functioning                                 General Comments: disoriented to day of week, month, able to state most recent holiday correctly (Thanksgiving) and with assist he was able to problem solve current month; oriented to location and self; overall following commands given increased time and intermittent cues   General Comments  HR up to 116 with activity; DOE 3/4 with room level activity though SpO2 96% on RA    Exercises     Shoulder Instructions      Home Living Family/patient expects to be discharged to:: Private residence Living Arrangements: Other relatives(cousin) Available Help at Discharge: Family;Available 24 hours/day Type of Home: Apartment Home Access: Level entry     Home Layout: One level     Bathroom Shower/Tub: Chief Strategy Officer: Standard     Home Equipment: Shower seat;Grab bars - tub/shower;Bedside commode;Walker - 2 wheels;Cane - single point          Prior Functioning/Environment Level of Independence: Independent        Comments: pt reports  he was driving        OT Problem List: Decreased strength;Decreased range of motion;Decreased activity tolerance;Impaired balance (sitting and/or standing);Decreased safety awareness;Decreased cognition;Cardiopulmonary status limiting activity      OT Treatment/Interventions: Self-care/ADL training;Therapeutic exercise;Energy conservation;Therapeutic activities;Patient/family education;Balance training    OT Goals(Current goals can be found in the care plan section) Acute Rehab OT Goals Patient Stated Goal: regain strength/independence OT Goal Formulation: With patient Time For Goal Achievement: 10/06/18 Potential to Achieve Goals: Good  OT Frequency: Min 2X/week   Barriers to D/C:            Co-evaluation PT/OT/SLP Co-Evaluation/Treatment: Yes Reason for Co-Treatment: For patient/therapist safety;To address functional/ADL transfers;Necessary to address cognition/behavior during functional activity   OT goals addressed during session: ADL's and self-care;Strengthening/ROM      AM-PAC OT "6 Clicks" Daily Activity     Outcome Measure Help from another person eating meals?: None Help from another person taking care of personal grooming?: A Little Help from another person toileting, which includes using toliet, bedpan, or urinal?: A Lot Help from another person bathing (including washing, rinsing, drying)?: A Lot Help from another person to put on and taking off regular upper body clothing?: A Little Help from another person to put on and taking off regular lower body clothing?: A Lot 6 Click Score: 16   End of Session Nurse Communication: Mobility status  Activity Tolerance: Patient tolerated treatment well;Patient limited by fatigue Patient left: in chair;with call bell/phone within reach;with chair alarm set  OT Visit Diagnosis: Unsteadiness on feet (R26.81);Other abnormalities  of gait and mobility (R26.89);Muscle weakness (generalized) (M62.81);Other symptoms and signs  involving cognitive function                Time: 1610-9604 OT Time Calculation (min): 26 min Charges:  OT General Charges $OT Visit: 1 Visit OT Evaluation $OT Eval Moderate Complexity: 1 Mod  Marcy Siren, OT [employer] Pager [phone] Office [phone]  Orlando Penner 09/22/2018, 4:27 PM

## 2018-09-23 ENCOUNTER — Inpatient Hospital Stay (HOSPITAL_COMMUNITY): Payer: Medicare Other

## 2018-09-23 DIAGNOSIS — R410 Disorientation, unspecified: Secondary | ICD-10-CM

## 2018-09-23 DIAGNOSIS — R7881 Bacteremia: Secondary | ICD-10-CM

## 2018-09-23 DIAGNOSIS — R9389 Abnormal findings on diagnostic imaging of other specified body structures: Secondary | ICD-10-CM

## 2018-09-23 DIAGNOSIS — G9341 Metabolic encephalopathy: Secondary | ICD-10-CM

## 2018-09-23 DIAGNOSIS — I10 Essential (primary) hypertension: Secondary | ICD-10-CM

## 2018-09-23 DIAGNOSIS — B9620 Unspecified Escherichia coli [E. coli] as the cause of diseases classified elsewhere: Secondary | ICD-10-CM

## 2018-09-23 DIAGNOSIS — B965 Pseudomonas (aeruginosa) (mallei) (pseudomallei) as the cause of diseases classified elsewhere: Secondary | ICD-10-CM

## 2018-09-23 DIAGNOSIS — R7989 Other specified abnormal findings of blood chemistry: Secondary | ICD-10-CM

## 2018-09-23 DIAGNOSIS — Z8673 Personal history of transient ischemic attack (TIA), and cerebral infarction without residual deficits: Secondary | ICD-10-CM

## 2018-09-23 DIAGNOSIS — F17200 Nicotine dependence, unspecified, uncomplicated: Secondary | ICD-10-CM

## 2018-09-23 DIAGNOSIS — R509 Fever, unspecified: Secondary | ICD-10-CM

## 2018-09-23 LAB — CBC WITH DIFFERENTIAL/PLATELET
Band Neutrophils: 0 %
Basophils Absolute: 0 10*3/uL (ref 0.0–0.1)
Basophils Relative: 0 %
Blasts: 0 %
EOS PCT: 1 %
Eosinophils Absolute: 0.1 10*3/uL (ref 0.0–0.5)
HCT: 41.5 % (ref 39.0–52.0)
HEMOGLOBIN: 14.2 g/dL (ref 13.0–17.0)
Lymphocytes Relative: 12 %
Lymphs Abs: 0.7 10*3/uL (ref 0.7–4.0)
MCH: 30.8 pg (ref 26.0–34.0)
MCHC: 34.2 g/dL (ref 30.0–36.0)
MCV: 90 fL (ref 80.0–100.0)
MYELOCYTES: 0 %
Metamyelocytes Relative: 0 %
Monocytes Absolute: 0.9 10*3/uL (ref 0.1–1.0)
Monocytes Relative: 15 %
NEUTROS PCT: 72 %
NRBC: 0 /100{WBCs}
Neutro Abs: 4.2 10*3/uL (ref 1.7–7.7)
Other: 0 %
Platelets: 113 10*3/uL — ABNORMAL LOW (ref 150–400)
Promyelocytes Relative: 0 %
RBC: 4.61 MIL/uL (ref 4.22–5.81)
RDW: 14.6 % (ref 11.5–15.5)
WBC: 5.9 10*3/uL (ref 4.0–10.5)
nRBC: 0 % (ref 0.0–0.2)

## 2018-09-23 LAB — VITAMIN B1: Vitamin B1 (Thiamine): 110.2 nmol/L (ref 66.5–200.0)

## 2018-09-23 LAB — BASIC METABOLIC PANEL
Anion gap: 10 (ref 5–15)
BUN: 15 mg/dL (ref 8–23)
CO2: 21 mmol/L — AB (ref 22–32)
Calcium: 8.5 mg/dL — ABNORMAL LOW (ref 8.9–10.3)
Chloride: 105 mmol/L (ref 98–111)
Creatinine, Ser: 1.42 mg/dL — ABNORMAL HIGH (ref 0.61–1.24)
GFR calc Af Amer: 58 mL/min — ABNORMAL LOW (ref >60)
GFR calc non Af Amer: 50 mL/min — ABNORMAL LOW (ref >60)
Glucose, Bld: 94 mg/dL (ref 70–99)
Potassium: 3.7 mmol/L (ref 3.5–5.1)
Sodium: 136 mmol/L (ref 135–145)

## 2018-09-23 LAB — MAGNESIUM: Magnesium: 2.3 mg/dL (ref 1.7–2.4)

## 2018-09-23 LAB — URINE CULTURE
Culture: NO GROWTH
Special Requests: NORMAL

## 2018-09-23 LAB — CREATININE, URINE, RANDOM: Creatinine, Urine: 187.49 mg/dL

## 2018-09-23 LAB — SODIUM, URINE, RANDOM: Sodium, Ur: 154 mmol/L

## 2018-09-23 LAB — T3, FREE: T3, Free: 1.7 pg/mL — ABNORMAL LOW (ref 2.0–4.4)

## 2018-09-23 MED ORDER — SODIUM CHLORIDE 0.9 % IV SOLN
INTRAVENOUS | Status: AC
  Administered 2018-09-23: 12:00:00 via INTRAVENOUS
  Administered 2018-09-24: 1000 mL via INTRAVENOUS

## 2018-09-23 MED ORDER — POTASSIUM CHLORIDE 10 MEQ/100ML IV SOLN
10.0000 meq | INTRAVENOUS | Status: AC
  Administered 2018-09-23 (×3): 10 meq via INTRAVENOUS
  Filled 2018-09-23 (×3): qty 100

## 2018-09-23 MED ORDER — AMLODIPINE BESYLATE 5 MG PO TABS
5.0000 mg | ORAL_TABLET | Freq: Every day | ORAL | Status: DC
  Administered 2018-09-23 – 2018-09-25 (×3): 5 mg via ORAL
  Filled 2018-09-23 (×3): qty 1

## 2018-09-23 NOTE — Progress Notes (Signed)
EEG completed; results pending.    

## 2018-09-23 NOTE — Progress Notes (Addendum)
Family Medicine Teaching Service Daily Progress Note Intern Pager: [phone]  Patient name: Austin Weaver Medical record number: [id number] Date of birth: 1946-07-21 Age: 72 y.o. Gender: male  Primary Care Provider: Patient, No Pcp Per Consultants: Neuro Code Status: Full  Assessment and Plan: Austin Weaver is a 72 y.o. male presenting after being found down by family in the bathroom, initially with slurred speech and extremity weakness on arrival, and also found to have a fever of 102.3 and tachycardic concerning for infection. PMH is significant for hypertension, history of TIA, and previous CVA.   Sepsis 2/2 gram negative rod bacteremia:  Acute.  Not febrile since admission.  Asymptomatic.  E. coli and Pseudomonas growing in 1/2 blood cultures, remaining culture no growth at 2 days. Question contaminant, however would not usually expect the above bacteria to cause contaminants.  Urine culture no growth.  Renal U/S with no evidence of perinephric abscess.  Etiology of bacteremia remains unclear, no evidence of source via urine, pulmonary, or Derm. No history of IV drug use. -Consult ID, appreciate further recommendations -Continue IV cefepime, likely switch to oral Cipro -Continue to monitor blood cultures, susceptibilities pending -Repeat blood cultures tomorrow -Monitor fever curve, vitals -Tylenol as needed for fever  Found down, possible syncopal event  AMS: Acute, improving.  Alert, oriented to person, place, but not time, same for duration of admission-may be his baseline.  No focal neuro deficits on exam. Precipitating CVA ruled out with MRI without acute intracranial findings.  EEG still pending.  Metabolic, valvular, or drug-induced etiologies ruled out. Etiology still unclear, may be secondary to bacteremia as above, vs B12 deficiency vs micturition syncopal event, vs seizure.  Will try to reach out to family today to obtain further history. -Neurology on board, will  follow up following EEG results -Monitor EKG -Follow-up vitamin B1 level -PT/OT recommending SNF -Treating bacteremia and repleting vitamin B12 as below -Vitals per routine  B12 deficiency:  B12 108. Potentially contributing to initial presentation as above. - Replete with 1000 mcg B12 injection weekly, completed on 12/19   AKI:  Cr 1.42, 1.54 on admit, baseline appears around 1.0.   Would have suspected creatinine to steadily decrease with IV fluids, however is remaining stable around 1.4-1.5. -Obtain urine sodium, urine creatinine -Light rehydration with 75 mL/hour NS X 8 hours -Monitor BMP - Avoid nephrotoxic medications  Asymmetric RL Edema:  Improved. Right lower extremity ultrasound showing no evidence of DVT.  Hypertension: Chronic, Stable. SBP 140-160s.   -Restart home amlodipine 5 mg - Monitor BP  Previous CVA/TIA: Chronic, stable. CT head showing chronic lacunar infarct in L thalamus and R external capsule infarct. - Continue home aspirin 325 - Cont home atorvastatin 80 mg  FEN/GI: heart healthy diet Prophylaxis: Lovenox   Disposition: Continue care for bacteremia  Subjective:  Doing well this morning, no complaints.  He states he continues to do well and denied any chest pain, shortness of breath, abdominal pain, weakness, or coughing.   Objective: Temp:  [98 F (36.7 C)-99.4 F (37.4 C)] 98.8 F (37.1 C) (12/19 0742) Pulse Rate:  [72-92] 86 (12/19 0742) Resp:  [16-20] 16 (12/19 0742) BP: (144-160)/(75-100) 151/75 (12/19 0742) SpO2:  [95 %-99 %] 98 % (12/19 0742) Physical Exam: General: Alert, NAD HEENT: NCAT, MMM, oropharynx nonerythematous  Cardiac: RRR no m/g/r Lungs: Clear bilaterally, no increased WOB  Abdomen: soft, non-tender, non-distended, normoactive BS Msk: Moves all extremities spontaneously  Ext: Warm, dry, 2+ distal pulses, no edema, SCDs in place Neuro:  Alert and oriented to person and place, not time similar as previous.  Able to  follow commands, speech appropriate.  EOMI.  No focal neuro deficits on exam.   Laboratory: Recent Labs  Lab 09/21/18 1857 09/22/18 0235 09/23/18 0510  WBC 14.6* 9.9 5.9  HGB 14.9 14.6 14.2  HCT 45.0 42.5 41.5  PLT 152 131* 113*   Recent Labs  Lab 09/21/18 1232 09/21/18 1239 09/21/18 1857 09/22/18 0235 09/23/18 0510  NA 137 138  --  139 136  K 4.1 4.1  --  4.3 3.7  CL 102 101  --  105 105  CO2 22  --   --  26 21*  BUN 10 12  --  10 15  CREATININE 1.54* 1.30* 1.50*  1.45* 1.39* 1.42*  CALCIUM 9.2  --   --  8.6* 8.5*  PROT 7.5  --   --  6.8  --   BILITOT 1.2  --   --  1.1  --   ALKPHOS 75  --   --  64  --   ALT 17  --   --  16  --   AST 21  --   --  23  --   GLUCOSE 89 86  --  96 94    Imaging/Diagnostic Tests: Dg Chest 2 View  Result Date: 09/22/2018 CLINICAL DATA:  Fever EXAM: CHEST - 2 VIEW COMPARISON:  09/21/2018 FINDINGS: Heart is borderline in size. No confluent airspace opacities, effusions or edema. No acute bony abnormality. IMPRESSION: No active cardiopulmonary disease. Electronically Signed   By: Charlett NoseKevin  Dover M.D.   On: 09/22/2018 09:57   Dg Tibia/fibula Right  Result Date: 09/21/2018 CLINICAL DATA:  Osteomyelitis to right lower leg. Hx of TIA, hypertension. EXAM: RIGHT TIBIA AND FIBULA - 2 VIEW COMPARISON:  None. FINDINGS: There is no evidence of fracture or other focal bone lesions. Soft tissues are unremarkable. No subcutaneous gas. Popliteal and tibial arterial calcifications. IMPRESSION: 1. No acute bone abnormality. 2. Arterial calcifications. Electronically Signed   By: Corlis Leak  Hassell M.D.   On: 09/21/2018 17:26    Allayne StackBeard, [place] Joswick N, DO 09/23/2018, 8:41 AM PGY-1, [facility] FPTS Intern pager: [phone], text pages welcome

## 2018-09-23 NOTE — Care Management Important Message (Signed)
Important Message  Patient Details  Name: Austin Weaver MRN: [id number] Date of Birth: 1946/04/01   Medicare Important Message Given:  Yes  Due to illness  Patient not able to sign.  Unsigned copy left   Unika Nazareno 09/23/2018, 3:27 PM

## 2018-09-23 NOTE — Care Management Note (Signed)
Pt admitted with pseudomonal sepsis. He lives with his cousin, Austin Weaver. Pt denies any DME of issues obtaining his medications. He states family provides transportation.   CM met with the patient and he is interested in some rehab post d/c. CM inquired about calling his daughter. He states he doesn't have a daughter he has a cousin and her daughter. He asked that Austin Weaver be contacted.  CM attempted to reach [place] without success and unable to leave message.  CM following.

## 2018-09-23 NOTE — Progress Notes (Signed)
Chaplain greeted patient and shared was responding to request for prayer.  Patient said he would love to be back in good health.  He had some trouble communicating but was very receptive to prayer.  Prayers for his health and for all staff providing care.  Phebe CollaDonna S Andree Heeg, Chaplain/   09/23/18 1000  Clinical Encounter Type  Visited With Patient  Visit Type Initial;Spiritual support  Referral From Patient  Consult/Referral To Chaplain  Spiritual Encounters  Spiritual Needs Prayer (prayer)  Stress Factors  Patient Stress Factors Health changes  Family Stress Factors None identified

## 2018-09-23 NOTE — Procedures (Signed)
History: 72 yo M being evaluated for possible syncope  Sedation: none  Technique: This is a 21 channel routine scalp EEG performed at the bedside with bipolar and monopolar montages arranged in accordance to the international 10/20 system of electrode placement. One channel was dedicated to EKG recording.    Background: The background consists of intermixed alpha and beta activities. There is a well defined posterior dominant rhythm of 8 Hz that attenuates with eye opening. Sleep is recorded with normal appearing structures.   Photic stimulation: Physiologic driving is not perfomred  EEG Abnormalities: None  Clinical Interpretation: This normal EEG is recorded in the waking and sleep state. There was no seizure or seizure predisposition recorded on this study. Please note that lack of epileptiform activity on EEG does not preclude the possibility of epilepsy.   Ritta SlotMcNeill Lenzie Sandler, MD [facility] [phone]  If 7pm- 7am, please page neurology on call as listed in [facility].

## 2018-09-23 NOTE — Consult Note (Signed)
[facility]    Date of Admission:  09/21/2018     Total days of antibiotics 3               Reason for Consult: Pseudomonas/E. Coli Bacteremia Referring Provider: McDiarmid Primary Care Provider: Patient, No Pcp Per   Assessment/Plan:  [name] is a 72 y/o male admitted with altered mental status and fever and found to have E. Coli and Pseudomonas bacteremia in 2/4 of bottles. Source of the infection remains unclear at this time.Urine does not appear to be a source of infection and he has no symptoms that he can recall.  Mental status appears to be improved and he is confused at times which I am not sure if that is his baseline or not. Will recommend CT scan with contrast if his kidney function will allow to rule out a potential source of infection.  He has remained afebrile and without leukocytosis. Currently on Day 3 of antimicrobial therapy with Cefepime.  1. Continue cefepime for Pseudomonas and E. Coli bacteremia.  2. Recommend CT scan of the abdomen and pelvis with contrast if able to rule out sources of infection. 3. Continue to monitor for fevers.    Principal Problem:   Pseudomonal sepsis (HCC) Active Problems:   Altered mental status, unspecified   AMS (altered mental status)   AKI (acute kidney injury) (HCC)   Febrile illness   Fall   Abnormal CXR   Weakness   E. coli sepsis (HCC)   Vitamin B12 deficiency   CVA (cerebral vascular accident) [facility](HCC), old   Gram negative sepsis (HCC)   . amLODipine  5 mg Oral Daily  . aspirin  325 mg Oral Daily  . atorvastatin  80 mg Oral q1800  . enoxaparin (LOVENOX) injection  40 mg Subcutaneous Q24H  . senna  1 tablet Oral BID     HPI: Austin Weaver is a 72 y.o. male with previous medical history of hypertension and TIA presenting to the ED for altered mental status. EMS found him in his residence on the bathroom floor.   In the ED found to have signs and symptoms associated of  sepsis with a fever of 102.3 and tachycardia. Initial work up as a code stroke. Creatinine was 1.54 with lactic acid of 1.96. CT of the head with no acute findings and chronic small vessel ischemia. Chest x-ray with non-specific mild bilateral pulmonary interstitial markings. Was noted to have a small growth-like mass in the center of his right sole with x-ray showing no bony abnormality. He received vancomycin and ceftriaxone in the ED.  [name] has remained afebrile since initial admission and WBC count has trended down to now 5.9. Blood cultures were positive for E. Coli and Pseudomonas in 2/4 bottles. Antimicrobial therapy was changed to cefepime.    Review of Systems: Review of Systems  Constitutional: Negative for chills, fever and malaise/fatigue.  Eyes: Negative for blurred vision and double vision.  Respiratory: Negative for cough, shortness of breath and wheezing.   Cardiovascular: Negative for chest pain and leg swelling.  Gastrointestinal: Negative for abdominal pain, constipation, diarrhea, nausea and vomiting.  Genitourinary: Negative for dysuria, frequency, hematuria and urgency.  Musculoskeletal: Negative for back pain, joint pain and myalgias.  Skin: Negative for rash.  Neurological: Negative for dizziness, loss of consciousness, weakness and headaches.     Past Medical History:  Diagnosis Date  . Fall 09/21/2018  . History of transient ischemic attack (TIA)  11/20/2017  . History of transient ischemic attack (TIA) 11/20/2017  . Hypertension   . Old Cerebral infarction (HCC) 09/21/2018   Remote L. thalamic and R. external capsul & corona radiata infarcts    Social History   Tobacco Use  . Smoking status: Current Every Day Smoker  . Smokeless tobacco: Never Used  Substance Use Topics  . Alcohol use: No    Frequency: Never  . Drug use: Not on file    Family History  Problem Relation Age of Onset  . Hypertension Mother     No Known  Allergies  OBJECTIVE: Blood pressure (!) 139/98, pulse 63, temperature 97.8 F (36.6 C), temperature source Oral, resp. rate 18, height 5\' 6"  (1.676 m), weight 55.3 kg, SpO2 98 %.  Physical Exam Constitutional:      General: He is not in acute distress.    Appearance: He is well-developed.     Comments: Lying in the bed with head of bed elevated.  Cardiovascular:     Rate and Rhythm: Normal rate and regular rhythm.     Heart sounds: Normal heart sounds. No murmur. No friction rub. No gallop.   Pulmonary:     Effort: Pulmonary effort is normal.     Breath sounds: Normal breath sounds.  Abdominal:     General: Bowel sounds are normal. There is no distension.     Palpations: Abdomen is soft. There is no mass.     Tenderness: There is no abdominal tenderness.  Skin:    General: Skin is warm and dry.  Neurological:     Mental Status: He is alert. He is disoriented.  Psychiatric:        Mood and Affect: Mood normal.     Lab Results Lab Results  Component Value Date   WBC 5.9 09/23/2018   HGB 14.2 09/23/2018   HCT 41.5 09/23/2018   MCV 90.0 09/23/2018   PLT 113 (L) 09/23/2018    Lab Results  Component Value Date   CREATININE 1.42 (H) 09/23/2018   BUN 15 09/23/2018   NA 136 09/23/2018   K 3.7 09/23/2018   CL 105 09/23/2018   CO2 21 (L) 09/23/2018    Lab Results  Component Value Date   ALT 16 09/22/2018   AST 23 09/22/2018   ALKPHOS 64 09/22/2018   BILITOT 1.1 09/22/2018     Microbiology: Recent Results (from the past 240 hour(s))  Urine culture     Status: None   Collection Time: 09/21/18  1:00 PM  Result Value Ref Range Status   Specimen Description URINE, RANDOM  Final   Special Requests Normal  Final   Culture   Final    NO GROWTH Performed at [facility], [address], [place], [place] [address]    Report Status 09/23/2018 FINAL  Final  Culture, blood (routine x 2)     Status: None (Preliminary result)   Collection Time: 09/21/18  1:41 PM   Result Value Ref Range Status   Specimen Description BLOOD LEFT ANTECUBITAL  Final   Special Requests   Final    BOTTLES DRAWN AEROBIC AND ANAEROBIC Blood Culture results may not be optimal due to an inadequate volume of blood received in culture bottles   Culture   Final    NO GROWTH 2 DAYS Performed at [facility], [address] [place], [place] [address]    Report Status PENDING  Incomplete  Culture, blood (routine x 2)  Status: Abnormal (Preliminary result)   Collection Time: 09/21/18  1:41 PM  Result Value Ref Range Status   Specimen Description BLOOD RIGHT WRIST  Final   Special Requests   Final    BOTTLES DRAWN AEROBIC AND ANAEROBIC Blood Culture adequate volume   Culture  Setup Time   Final    GRAM NEGATIVE RODS IN BOTH AEROBIC AND ANAEROBIC BOTTLES Organism ID to follow CRITICAL RESULT CALLED TO, READ BACK BY AND VERIFIED WITH: J LEDFORD PHARMD [id number] 09/22/18 A BROWNING    Culture (A)  Final    ESCHERICHIA COLI PSEUDOMONAS AERUGINOSA SUSCEPTIBILITIES TO FOLLOW Performed at [facility], [address] [place], [place] [address]    Report Status PENDING  Incomplete  Blood Culture ID Panel (Reflexed)     Status: Abnormal   Collection Time: 09/21/18  1:41 PM  Result Value Ref Range Status   Enterococcus species NOT DETECTED NOT DETECTED Final   Listeria monocytogenes NOT DETECTED NOT DETECTED Final   Staphylococcus species NOT DETECTED NOT DETECTED Final   Staphylococcus aureus (BCID) NOT DETECTED NOT DETECTED Final   Streptococcus species NOT DETECTED NOT DETECTED Final   Streptococcus agalactiae NOT DETECTED NOT DETECTED Final   Streptococcus pneumoniae NOT DETECTED NOT DETECTED Final   Streptococcus pyogenes NOT DETECTED NOT DETECTED Final   Acinetobacter baumannii NOT DETECTED NOT DETECTED Final   Enterobacteriaceae species DETECTED (A) NOT DETECTED Final    Comment: Enterobacteriaceae represent a large family of gram-negative bacteria, not a  single organism. CRITICAL RESULT CALLED TO, READ BACK BY AND VERIFIED WITH: J LEDFORD PHARMD 0507 09/22/18 A BROWNING    Enterobacter cloacae complex NOT DETECTED NOT DETECTED Final   Escherichia coli DETECTED (A) NOT DETECTED Final    Comment: CRITICAL RESULT CALLED TO, READ BACK BY AND VERIFIED WITH: J LEDFORD PHARMD 0507 09/22/18 A BROWNING    Klebsiella oxytoca NOT DETECTED NOT DETECTED Final   Klebsiella pneumoniae NOT DETECTED NOT DETECTED Final   Proteus species NOT DETECTED NOT DETECTED Final   Serratia marcescens NOT DETECTED NOT DETECTED Final   Carbapenem resistance NOT DETECTED NOT DETECTED Final   Haemophilus influenzae NOT DETECTED NOT DETECTED Final   Neisseria meningitidis NOT DETECTED NOT DETECTED Final   Pseudomonas aeruginosa DETECTED (A) NOT DETECTED Final    Comment: CRITICAL RESULT CALLED TO, READ BACK BY AND VERIFIED WITH: J LEDFORD PHARMD 0507 09/22/18 A BROWNING    Candida albicans NOT DETECTED NOT DETECTED Final   Candida glabrata NOT DETECTED NOT DETECTED Final   Candida krusei NOT DETECTED NOT DETECTED Final   Candida parapsilosis NOT DETECTED NOT DETECTED Final   Candida tropicalis NOT DETECTED NOT DETECTED Final    Comment: Performed at [facility], [address] [place], [place] [address]     Marcos EkeGreg Calone, NP [facility] [phone] Pager  09/23/2018  2:15 PM

## 2018-09-23 NOTE — Progress Notes (Addendum)
NEUROLOGY PROGRESS NOTE  Subjective: Patient is comfortable and eating lunch.  He is aware that he is at the hospital, the month is December and able to name objects.  Exam: Vitals:   09/23/18 0742 09/23/18 1107  BP: (!) 151/75 (!) 139/98  Pulse: 86 63  Resp: 16 18  Temp: 98.8 F (37.1 C) 97.8 F (36.6 C)  SpO2: 98% 98%    Physical Exam   HEENT-  Normocephalic, no lesions, without obvious abnormality.  Normal external eye and conjunctiva.   Extremities- Warm, dry and intact Musculoskeletal-no joint tenderness, deformity or swelling Skin-warm and dry, no hyperpigmentation, vitiligo, or suspicious lesions    Neuro:  Mental Status: Alert, oriented to hospital and month but not year.  Able to name objects.  Able to follow commands.  Speech is fluent. Cranial Nerves: II:  Visual fields grossly normal,  III,IV, VI: ptosis not present, extra-ocular motions intact bilaterally pupils equal, round, reactive to light and accommodation V,VII: smile symmetric, facial light touch sensation normal bilaterally VIII: hearing normal bilaterally IX,X: uvula rises midline XI: bilateral shoulder shrug XII: midline tongue extension Motor: Right : Upper extremity   5/5    Left:     Upper extremity   4/5  Lower extremity   4/5     Lower extremity   4/5 Tone and bulk:normal tone throughout; no atrophy noted Sensory: Pinprick and light touch intact throughout, bilaterally Deep Tendon Reflexes: 2+ and symmetric throughout without Achilles reflex Plantars: Right: downgoing   Left: downgoing     Medications:  Scheduled: . amLODipine  5 mg Oral Daily  . aspirin  325 mg Oral Daily  . atorvastatin  80 mg Oral q1800  . enoxaparin (LOVENOX) injection  40 mg Subcutaneous Q24H  . senna  1 tablet Oral BID    Pertinent Labs/Diagnostics:     Mr Brain Wo Contrast  Result Date: 09/22/2018 CLINICAL DATA:  72 y/o M; found down with slurred speech, extremity weakness, fever, tachycardia. Syncope  versus seizure versus CVA. EXAM: MRI HEAD WITHOUT CONTRAST TECHNIQUE: Multiplanar, multiecho pulse sequences of the brain and surrounding structures were obtained without intravenous contrast. COMPARISON:  09/21/2018 CT head. FINDINGS: Brain: No acute infarction, hemorrhage, hydrocephalus, extra-axial collection or mass lesion. Small focus of susceptibility hypointensity within the left parietal periventricular white matter compatible with hemosiderin deposition of chronic microhemorrhage. Small chronic infarction extending from the right putamen into right frontal periventricular white matter. Nonspecific T2 FLAIR hyperintensities in subcortical and periventricular white matter as well as the pons are compatible with moderate chronic microvascular ischemic changes for age. Moderate volume loss of the brain. Vascular: Normal flow voids. Skull and upper cervical spine: Normal marrow signal. Sinuses/Orbits: Negative. Other: None. IMPRESSION: 1. No acute intracranial abnormality identified. 2. Small chronic infarction extending from the right putamen to right frontal periventricular white matter. Moderate chronic microvascular ischemic changes and volume loss of the brain. Electronically Signed   By: Mitzi HansenLance  Furusawa-Stratton M.D.   On: 09/22/2018 19:12      Ct Head Code Stroke Wo Contrast  Result Date: 09/21/2018 CLINICAL DATA:  Code stroke. Sudden onset mental status changes. Left arm and leg weakness. Right leg weakness. EXAM: CT HEAD WITHOUT CONTRAST TECHNIQUE: Contiguous axial images were obtained from the base of the skull through the vertex without intravenous contrast. COMPARISON:  None. FINDINGS: Brain: No evidence of acute infarction, hemorrhage, hydrocephalus, extra-axial collection or mass lesion/mass effect. Lacunar infarct in the left thalamus that has a chronic appearance on coronal reformats. Remote  perforator infarct in the right external capsule and corona radiata. Ventriculomegaly attributed  atrophy. Vascular: Extensive confluent atherosclerotic calcification. Skull: No acute finding. Sinuses/Orbits: Negative Other: These results were communicated to at 1:06 pmon 12/17/2019by text page via the [facility] messaging system. ASPECTS [facility]([place] Stroke Program Early CT Score) -not scored with this history IMPRESSION: 1. No acute finding. 2. Chronic small vessel ischemia with remote small vessel infarcts. Electronically Signed   By: Marnee SpringJonathon  Watts M.D.   On: 09/21/2018 13:06    Felicie MornDavid Smith PA-C [facility] [phone]  EEG : This normal EEG is recorded in the waking and sleep state. There was no seizure or seizure predisposition recorded on this study. Please note that lack of epileptiform activity on EEG does not preclude the possibility of epilepsy    Assessment: Encephalopathy most likely secondary to urosepsis.  Patient no longer is encephalopathic but still has some confusion.  MRI and EEG are negative.   Recommendations: -At this time would continue to treat urosepsis - No further recommendations per neurology - Neurology will sign off at this time please call us with any questions    09/23/2018, 12:02 PM    NEUROHOSPITALIST ADDENDUM Performed a face to face diagnostic evaluation.   I have reviewed the contents of history and physical exam as documented by PA/ARNP/Resident and agree with above documentation.  I have discussed and formulated the above plan as documented. Edits to the note have been made as needed.  Impression: Altered mental status due to gram-negative sepsis.  MRI negative for acute stroke and EEG is normal.  There was no clinical seizure activity witnessed and therefore would not start patient on AED.  Weakness on presentation likely due to recrudescence of stroke symptoms in the setting of sepsis.  We will sign off. Thanks for the consult.     Georgiana SpinnerSushanth Tinleigh Whitmire MD [facility] [phone]   If 7pm to 7am, please call on call as  listed on AMION.

## 2018-09-24 ENCOUNTER — Inpatient Hospital Stay (HOSPITAL_COMMUNITY): Payer: Medicare Other

## 2018-09-24 LAB — CBC WITH DIFFERENTIAL/PLATELET
Abs Immature Granulocytes: 0.05 10*3/uL (ref 0.00–0.07)
Basophils Absolute: 0 10*3/uL (ref 0.0–0.1)
Basophils Relative: 0 %
Eosinophils Absolute: 0.1 10*3/uL (ref 0.0–0.5)
Eosinophils Relative: 2 %
HCT: 43.1 % (ref 39.0–52.0)
Hemoglobin: 14.4 g/dL (ref 13.0–17.0)
Immature Granulocytes: 1 %
Lymphocytes Relative: 17 %
Lymphs Abs: 0.9 10*3/uL (ref 0.7–4.0)
MCH: 29.8 pg (ref 26.0–34.0)
MCHC: 33.4 g/dL (ref 30.0–36.0)
MCV: 89.2 fL (ref 80.0–100.0)
Monocytes Absolute: 0.9 10*3/uL (ref 0.1–1.0)
Monocytes Relative: 18 %
Neutro Abs: 3 10*3/uL (ref 1.7–7.7)
Neutrophils Relative %: 62 %
Platelets: 108 10*3/uL — ABNORMAL LOW (ref 150–400)
RBC: 4.83 MIL/uL (ref 4.22–5.81)
RDW: 14.2 % (ref 11.5–15.5)
WBC: 4.9 10*3/uL (ref 4.0–10.5)
nRBC: 0 % (ref 0.0–0.2)

## 2018-09-24 LAB — BASIC METABOLIC PANEL
Anion gap: 11 (ref 5–15)
BUN: 13 mg/dL (ref 8–23)
CHLORIDE: 106 mmol/L (ref 98–111)
CO2: 19 mmol/L — ABNORMAL LOW (ref 22–32)
CREATININE: 1.25 mg/dL — AB (ref 0.61–1.24)
Calcium: 8.4 mg/dL — ABNORMAL LOW (ref 8.9–10.3)
GFR calc Af Amer: >60 mL/min (ref >60)
GFR calc non Af Amer: 58 mL/min — ABNORMAL LOW (ref >60)
Glucose, Bld: 94 mg/dL (ref 70–99)
Potassium: 4.2 mmol/L (ref 3.5–5.1)
SODIUM: 136 mmol/L (ref 135–145)

## 2018-09-24 LAB — CULTURE, BLOOD (ROUTINE X 2): Special Requests: ADEQUATE

## 2018-09-24 MED ORDER — IOHEXOL 300 MG/ML  SOLN
100.0000 mL | Freq: Once | INTRAMUSCULAR | Status: AC | PRN
  Administered 2018-09-24: 100 mL via INTRAVENOUS

## 2018-09-24 MED ORDER — SODIUM CHLORIDE 0.9 % IV SOLN: INTRAVENOUS | Status: DC

## 2018-09-24 MED ORDER — CIPROFLOXACIN HCL 500 MG PO TABS
500.0000 mg | ORAL_TABLET | Freq: Two times a day (BID) | ORAL | Status: DC
  Administered 2018-09-24 – 2018-09-27 (×6): 500 mg via ORAL
  Filled 2018-09-24 (×6): qty 1

## 2018-09-24 MED ORDER — SODIUM CHLORIDE 0.9 % IV SOLN
INTRAVENOUS | Status: AC
  Administered 2018-09-24 (×2): via INTRAVENOUS
  Administered 2018-09-25: 1000 mL via INTRAVENOUS

## 2018-09-24 NOTE — Care Management (Addendum)
Continue to be unable to reach patients cousin, Alvira PhilipsGeraldine. CM was able to locate a friend of hers that provided a new number that also has not worked.  CM informed the patient of above. He [place]. Will continue to try and locate Alvira PhilipsGeraldine as time permits.   Addendum: 1350:: finally spoke to Alvira PhilipsGeraldine [phone]) and her daughter. They would like the patient to [facility] and [facility]. CSW updated.

## 2018-09-24 NOTE — [place] FL2 (Signed)
[place] MEDICAID FL2 LEVEL OF CARE SCREENING TOOL     IDENTIFICATION  Patient Name: Austin Weaver Birthdate: 1946-09-03 Sex: male Admission Date (Current Location): 09/21/2018  [facility] [place]   Facility and Address:  [facility], [address], [place], [place] [address]      Provider Number: [id number]  Attending Physician Name and Address:  McDiarmid, Leighton Roachodd D, MD  Relative Name and Phone Number:       Current Level of Care: Hospital Recommended Level of Care: Skilled Nursing Facility Prior Approval Number:    Date Approved/Denied:   PASRR Number: [id number]  Discharge Plan: SNF    Current Diagnoses: Patient Active Problem List   Diagnosis Date Noted  . Encephalopathy in sepsis   . Pseudomonal sepsis (HCC) 09/22/2018  . E. coli sepsis (HCC) 09/22/2018  . Vitamin B12 deficiency 09/22/2018  . CVA (cerebral vascular accident) [facility](HCC), old 09/22/2018  . Gram negative sepsis (HCC)   . Altered mental status, unspecified 09/21/2018  . AMS (altered mental status) 09/21/2018  . AKI (acute kidney injury) (HCC) 09/21/2018  . Febrile illness 09/21/2018  . SIRS (systemic inflammatory response syndrome) (HCC) 09/21/2018  . Fall 09/21/2018  . Abnormal CXR 09/21/2018  . Old Cerebral infarction (HCC) 09/21/2018  . Fever of undetermined origin   . Weakness     Orientation RESPIRATION BLADDER Height & Weight     Self, Situation, Place  Normal Incontinent Weight: 55.3 kg Height:  5\' 6"  (167.6 cm)  BEHAVIORAL SYMPTOMS/MOOD NEUROLOGICAL BOWEL NUTRITION STATUS      Continent Diet(heart healthy/ thin liquids)  AMBULATORY STATUS COMMUNICATION OF NEEDS Skin   Extensive Assist Verbally Normal                       Personal Care Assistance Level of Assistance  Bathing, Feeding, Dressing Bathing Assistance: Maximum assistance Feeding assistance: Limited assistance Dressing Assistance: Maximum assistance     Functional  Limitations Info  Sight, Hearing, Speech Sight Info: Adequate Hearing Info: Adequate Speech Info: Impaired    SPECIAL CARE FACTORS FREQUENCY  PT (By licensed PT), OT (By licensed OT)     PT Frequency: 5x/wk OT Frequency: 5x/wk            Contractures Contractures Info: Not present    Additional Factors Info  Code Status, Allergies Code Status Info: full Allergies Info: NKA           Current Medications (09/24/2018):  This is the current hospital active medication list Current Facility-Administered Medications  Medication Dose Route Frequency Provider Last Rate Last Dose  . 0.9 %  sodium chloride infusion   Intravenous Continuous Leticia PennaBeard, Samantha N, DO 100 mL/hr at 09/24/18 [id number]    . acetaminophen (TYLENOL) tablet 650 mg  650 mg Oral Q6H PRN Lockamy, Timothy, DO       Or  . acetaminophen (TYLENOL) suppository 650 mg  650 mg Rectal Q6H PRN Lockamy, Timothy, DO      . amLODipine (NORVASC) tablet 5 mg  5 mg Oral Daily Beard, Samantha N, DO   5 mg at 09/24/18 0858  . aspirin tablet 325 mg  325 mg Oral Daily Lockamy, Timothy, DO   325 mg at 09/24/18 0858  . atorvastatin (LIPITOR) tablet 80 mg  80 mg Oral q1800 Lockamy, Timothy, DO   80 mg at 09/23/18 1932  . ciprofloxacin (CIPRO) tablet 500 mg  500 mg Oral BID Daiva EvesVan Dam, Lisette Grinderornelius N, MD      .  enoxaparin (LOVENOX) injection 40 mg  40 mg Subcutaneous Q24H Lockamy, Timothy, DO   40 mg at 09/23/18 2046  . ondansetron (ZOFRAN) tablet 4 mg  4 mg Oral Q6H PRN Lockamy, Timothy, DO       Or  . ondansetron (ZOFRAN) injection 4 mg  4 mg Intravenous Q6H PRN Lockamy, Timothy, DO      . polyethylene glycol (MIRALAX / GLYCOLAX) packet 17 g  17 g Oral Daily PRN Lockamy, Timothy, DO      . senna (SENOKOT) tablet 8.6 mg  1 tablet Oral BID Lockamy, Timothy, DO   8.6 mg at 09/24/18 [id number]     Discharge Medications: Please see discharge summary for a list of discharge medications.  Relevant Imaging Results:  Relevant Lab  Results:   Additional Information SS#: [id number]  Kermit BaloKelli F Coco Sharpnack, RN

## 2018-09-24 NOTE — Progress Notes (Signed)
Subjective: No new complaints   Antibiotics:  Anti-infectives (From admission, onward)   Start     Dose/Rate Route Frequency Ordered Stop   09/22/18 1700  vancomycin (VANCOCIN) IVPB 750 mg/150 ml premix  Status:  Discontinued     750 mg 150 mL/hr over 60 Minutes Intravenous Every 24 hours 09/21/18 1638 09/22/18 1024   09/22/18 0600  ceFEPIme (MAXIPIME) 2 g in sodium chloride 0.9 % 100 mL IVPB     2 g 200 mL/hr over 30 Minutes Intravenous Every 24 hours 09/22/18 0540     09/21/18 1630  cefTRIAXone (ROCEPHIN) 2 g in sodium chloride 0.9 % 100 mL IVPB  Status:  Discontinued     2 g 200 mL/hr over 30 Minutes Intravenous Every 24 hours 09/21/18 1622 09/22/18 0540   09/21/18 1630  vancomycin (VANCOCIN) IVPB 1000 mg/200 mL premix     1,000 mg 200 mL/hr over 60 Minutes Intravenous  Once 09/21/18 1626 09/21/18 2133      Medications: Scheduled Meds: . amLODipine  5 mg Oral Daily  . aspirin  325 mg Oral Daily  . atorvastatin  80 mg Oral q1800  . enoxaparin (LOVENOX) injection  40 mg Subcutaneous Q24H  . senna  1 tablet Oral BID   Continuous Infusions: . sodium chloride 100 mL/hr at 09/24/18 0835  . ceFEPime (MAXIPIME) IV 2 g (09/24/18 0537)   PRN Meds:.acetaminophen **OR** acetaminophen, ondansetron **OR** ondansetron (ZOFRAN) IV, polyethylene glycol    Objective: Weight change:   Intake/Output Summary (Last 24 hours) at 09/24/2018 0947 Last data filed at 09/24/2018 0837 Gross per 24 hour  Intake 1436.02 ml  Output 1775 ml  Net -338.98 ml   Blood pressure (!) 162/78, pulse 62, temperature 98.3 F (36.8 C), temperature source Oral, resp. rate 20, height 5\' 6"  (1.676 m), weight 55.3 kg, SpO2 98 %. Temp:  [97.8 F (36.6 C)-98.6 F (37 C)] 98.3 F (36.8 C) (12/20 0735) Pulse Rate:  [62-69] 62 (12/20 0735) Resp:  [16-20] 20 (12/20 0735) BP: (139-162)/(78-98) 162/78 (12/20 0735) SpO2:  [97 %-100 %] 98 % (12/20 0735)  Physical Exam: General: Alert and awake,  oriented x person and place HEENT: anicteric sclera, EOMI CVS regular rate, normal  Chest: , no wheezing, no respiratory distress Abdomen: soft non-distended,  Skin: no rashes Neuro: nonfocal  CBC:    BMET Recent Labs    09/23/18 0510 09/24/18 0500  NA 136 136  K 3.7 4.2  CL 105 106  CO2 21* 19*  GLUCOSE 94 94  BUN 15 13  CREATININE 1.42* 1.25*  CALCIUM 8.5* 8.4*     Liver Panel  Recent Labs    09/21/18 1232 09/22/18 0235  PROT 7.5 6.8  ALBUMIN 4.1 3.5  AST 21 23  ALT 17 16  ALKPHOS 75 64  BILITOT 1.2 1.1       Sedimentation Rate No results for input(s): ESRSEDRATE in the last 72 hours. C-Reactive Protein No results for input(s): CRP in the last 72 hours.  Micro Results: Recent Results (from the past 720 hour(s))  Urine culture     Status: None   Collection Time: 09/21/18  1:00 PM  Result Value Ref Range Status   Specimen Description URINE, RANDOM  Final   Special Requests Normal  Final   Culture   Final    NO GROWTH Performed at [facility], [address], [place], [place] [address]    Report Status 09/23/2018 FINAL  Final  Culture, blood (routine x 2)     Status: None (Preliminary result)   Collection Time: 09/21/18  1:41 PM  Result Value Ref Range Status   Specimen Description BLOOD LEFT ANTECUBITAL  Final   Special Requests   Final    BOTTLES DRAWN AEROBIC AND ANAEROBIC Blood Culture results may not be optimal due to an inadequate volume of blood received in culture bottles   Culture   Final    NO GROWTH 2 DAYS Performed at [facility], [address], [place], [place] [address]    Report Status PENDING  Incomplete  Culture, blood (routine x 2)     Status: Abnormal   Collection Time: 09/21/18  1:41 PM  Result Value Ref Range Status   Specimen Description BLOOD RIGHT WRIST  Final   Special Requests   Final    BOTTLES DRAWN AEROBIC AND ANAEROBIC Blood Culture adequate volume   Culture  Setup Time   Final    GRAM NEGATIVE  RODS IN BOTH AEROBIC AND ANAEROBIC BOTTLES CRITICAL RESULT CALLED TO, READ BACK BY AND VERIFIED WITHMelven Sartorius PHARMD [id number] 09/22/18 A BROWNING Performed at [facility], [address], [place], [place] [address]    Culture ESCHERICHIA COLI PSEUDOMONAS AERUGINOSA  (A)  Final   Report Status 09/24/2018 FINAL  Final   Organism ID, Bacteria ESCHERICHIA COLI  Final   Organism ID, Bacteria PSEUDOMONAS AERUGINOSA  Final      Susceptibility   Escherichia coli - MIC*    AMPICILLIN <=2 SENSITIVE Sensitive     CEFAZOLIN <=4 SENSITIVE Sensitive     CEFEPIME <=1 SENSITIVE Sensitive     CEFTAZIDIME <=1 SENSITIVE Sensitive     CEFTRIAXONE <=1 SENSITIVE Sensitive     CIPROFLOXACIN <=0.25 SENSITIVE Sensitive     GENTAMICIN <=1 SENSITIVE Sensitive     IMIPENEM <=0.25 SENSITIVE Sensitive     TRIMETH/SULFA <=20 SENSITIVE Sensitive     AMPICILLIN/SULBACTAM <=2 SENSITIVE Sensitive     PIP/TAZO <=4 SENSITIVE Sensitive     Extended ESBL NEGATIVE Sensitive     * ESCHERICHIA COLI   Pseudomonas aeruginosa - MIC*    CEFTAZIDIME 4 SENSITIVE Sensitive     CIPROFLOXACIN <=0.25 SENSITIVE Sensitive     GENTAMICIN <=1 SENSITIVE Sensitive     IMIPENEM 2 SENSITIVE Sensitive     PIP/TAZO 8 SENSITIVE Sensitive     CEFEPIME 4 SENSITIVE Sensitive     * PSEUDOMONAS AERUGINOSA  Blood Culture ID Panel (Reflexed)     Status: Abnormal   Collection Time: 09/21/18  1:41 PM  Result Value Ref Range Status   Enterococcus species NOT DETECTED NOT DETECTED Final   Listeria monocytogenes NOT DETECTED NOT DETECTED Final   Staphylococcus species NOT DETECTED NOT DETECTED Final   Staphylococcus aureus (BCID) NOT DETECTED NOT DETECTED Final   Streptococcus species NOT DETECTED NOT DETECTED Final   Streptococcus agalactiae NOT DETECTED NOT DETECTED Final   Streptococcus pneumoniae NOT DETECTED NOT DETECTED Final   Streptococcus pyogenes NOT DETECTED NOT DETECTED Final   Acinetobacter baumannii NOT DETECTED NOT DETECTED  Final   Enterobacteriaceae species DETECTED (A) NOT DETECTED Final    Comment: Enterobacteriaceae represent a large family of gram-negative bacteria, not a single organism. CRITICAL RESULT CALLED TO, READ BACK BY AND VERIFIED WITH: J LEDFORD PHARMD 0507 09/22/18 A BROWNING    Enterobacter cloacae complex NOT DETECTED NOT DETECTED Final   Escherichia coli DETECTED (A) NOT DETECTED Final    Comment: CRITICAL RESULT CALLED TO,  READ BACK BY AND VERIFIED WITH: Melven SartoriusJ LEDFORD PHARMD 0507 09/22/18 A BROWNING    Klebsiella oxytoca NOT DETECTED NOT DETECTED Final   Klebsiella pneumoniae NOT DETECTED NOT DETECTED Final   Proteus species NOT DETECTED NOT DETECTED Final   Serratia marcescens NOT DETECTED NOT DETECTED Final   Carbapenem resistance NOT DETECTED NOT DETECTED Final   Haemophilus influenzae NOT DETECTED NOT DETECTED Final   Neisseria meningitidis NOT DETECTED NOT DETECTED Final   Pseudomonas aeruginosa DETECTED (A) NOT DETECTED Final    Comment: CRITICAL RESULT CALLED TO, READ BACK BY AND VERIFIED WITH: J LEDFORD PHARMD 0507 09/22/18 A BROWNING    Candida albicans NOT DETECTED NOT DETECTED Final   Candida glabrata NOT DETECTED NOT DETECTED Final   Candida krusei NOT DETECTED NOT DETECTED Final   Candida parapsilosis NOT DETECTED NOT DETECTED Final   Candida tropicalis NOT DETECTED NOT DETECTED Final    Comment: Performed at [facility], [address] [place], [place] [address]    Studies/Results: Mr Sherrin DaisyBrain Wo Contrast  Result Date: 09/22/2018 CLINICAL DATA:  72 y/o M; found down with slurred speech, extremity weakness, fever, tachycardia. Syncope versus seizure versus CVA. EXAM: MRI HEAD WITHOUT CONTRAST TECHNIQUE: Multiplanar, multiecho pulse sequences of the brain and surrounding structures were obtained without intravenous contrast. COMPARISON:  09/21/2018 CT head. FINDINGS: Brain: No acute infarction, hemorrhage, hydrocephalus, extra-axial collection or mass lesion. Small  focus of susceptibility hypointensity within the left parietal periventricular white matter compatible with hemosiderin deposition of chronic microhemorrhage. Small chronic infarction extending from the right putamen into right frontal periventricular white matter. Nonspecific T2 FLAIR hyperintensities in subcortical and periventricular white matter as well as the pons are compatible with moderate chronic microvascular ischemic changes for age. Moderate volume loss of the brain. Vascular: Normal flow voids. Skull and upper cervical spine: Normal marrow signal. Sinuses/Orbits: Negative. Other: None. IMPRESSION: 1. No acute intracranial abnormality identified. 2. Small chronic infarction extending from the right putamen to right frontal periventricular white matter. Moderate chronic microvascular ischemic changes and volume loss of the brain. Electronically Signed   By: Mitzi HansenLance  Furusawa-Stratton M.D.   On: 09/22/2018 19:12   [place]ore[place] Renal  Result Date: 09/22/2018 CLINICAL DATA:  Gram-negative sepsis, evaluate for perinephric abscess. Acute renal injury. EXAM: RENAL / URINARY TRACT ULTRASOUND COMPLETE COMPARISON:  None. FINDINGS: Right Kidney: Renal measurements: 9.2 x 4 x 3.7 cm (volume = 70 cm^3). Echogenicity within normal limits. Simple cyst in the upper pole of the right kidney measuring 2.4 x 2.1 x 2.5 cm. No solid-appearing mass or hydronephrosis visualized. No nephrolithiasis is identified. Left Kidney: Renal measurements: 9.3 x 6 x 5.1 cm (volume = 100 cm^3). There are least 3 simple appearing anechoic cysts within the left kidney the largest in the lower pole measuring approximately 5.6 cm in diameter. Smaller 2.1 and 1.7 cm in diameter cysts are also present in the mid to lower pole. No nephrolithiasis, solid appearing mass or hydronephrosis. Bladder: Appears normal for degree of bladder distention. IMPRESSION: No evidence of perinephric abscess. Bilateral simple appearing renal cysts. Electronically Signed    By: Tollie Ethavid  Kwon M.D.   On: 09/22/2018 20:30   Vas Koreas Lower Extremity Venous (dvt)  Result Date: 09/23/2018  Lower Venous Study Indications: Swelling.  Performing Technologist: Chanda BusingGregory Collins RVT  Examination Guidelines: A complete evaluation includes B-mode imaging, spectral Doppler, color Doppler, and power Doppler as needed of all accessible portions of each vessel. Bilateral testing is considered an integral part of a complete examination.  Limited examinations for reoccurring indications may be performed as noted.  Right Venous Findings: +---------+---------------+---------+-----------+----------+-------+          CompressibilityPhasicitySpontaneityPropertiesSummary +---------+---------------+---------+-----------+----------+-------+ CFV      Full           Yes      Yes                          +---------+---------------+---------+-----------+----------+-------+ SFJ      Full                                                 +---------+---------------+---------+-----------+----------+-------+ FV Prox  Full                                                 +---------+---------------+---------+-----------+----------+-------+ FV Mid   Full                                                 +---------+---------------+---------+-----------+----------+-------+ FV DistalFull                                                 +---------+---------------+---------+-----------+----------+-------+ PFV      Full                                                 +---------+---------------+---------+-----------+----------+-------+ POP      Full           Yes      Yes                          +---------+---------------+---------+-----------+----------+-------+ PTV      Full                                                 +---------+---------------+---------+-----------+----------+-------+ PERO     Full                                                  +---------+---------------+---------+-----------+----------+-------+  Left Venous Findings: +---+---------------+---------+-----------+----------+-------+    CompressibilityPhasicitySpontaneityPropertiesSummary +---+---------------+---------+-----------+----------+-------+ CFVFull           Yes      Yes                          +---+---------------+---------+-----------+----------+-------+    Summary: Right: There is no evidence of deep vein thrombosis in the lower extremity. No cystic structure found in the popliteal fossa. Left: No evidence of common femoral vein obstruction.  *See table(s) above for  measurements and observations. Electronically signed by Coral Else MD on 09/23/2018 at 7:53:14 AM.    Final       Assessment/Plan:  INTERVAL HISTORY: Creatinine better   Principal Problem:   Pseudomonal sepsis (HCC) Active Problems:   Altered mental status, unspecified   AMS (altered mental status)   AKI (acute kidney injury) (HCC)   Febrile illness   Fall   Abnormal CXR   Weakness   E. coli sepsis (HCC)   Vitamin B12 deficiency   CVA (cerebral vascular accident) [facility]), old   Gram negative sepsis (HCC)   Encephalopathy in sepsis    Theodore Virgin is a 72 y.o. male with polymicrobial gram-negative bacteremia based on blood cultures with E. coli and Pseudomonas being isolated 1 of 2 cultures when he presented with sepsis.  Source is not clear.  1.  Polymicrobial gram-negative sepsis:   He is doing well and his organisms are fairly sensitive we will step him down to oral therapy with ciprofloxacin.  I have ordered a CT of the abdomen pelvis with IV and oral contrast.  We will follow-up the results of the study.      LOS: 3 days   Acey Lav 09/24/2018, 9:47 AM

## 2018-09-24 NOTE — Progress Notes (Addendum)
Family Medicine Teaching Service Daily Progress Note Intern Pager: [phone]  Patient name: Austin CopaWillie Detjen Medical record number: [id number] Date of birth: 1946-02-25 Age: 72 y.o. Gender: male  Primary Care Provider: Patient, No Pcp Per Consultants: Neuro Code Status: Full  Assessment and Plan: Austin Weaver is a 72 y.o. male presenting after being found down by family in the bathroom, initially with slurred speech and extremity weakness on arrival, and also found to have a fever of 102.3 and tachycardic concerning for infection. PMH is significant for hypertension, history of TIA, and previous CVA.   Sepsis 2/2 gram negative rod bacteremia:  Acute.  Afebrile since admission, last fever on 12/17 at 1300.  Asymptomatic.  E. coli and Pseudomonas growing in 1/2 blood cultures, remaining culture no growth at 2 days.  Cultures pansensitive.  Etiology of bacteremia remains unclear, no evidence of source via urine, pulmonary, or Derm.  ID following. -ID on board, obtaining CT abdomen pelvis with contrast this a.m. - IV cefepime> transition to oral ciprofloxacin as this is sensitive -Monitor blood cultures, repeat blood cultures pending this morning -Monitor fever curve, vitals -Tylenol as needed for fever - 100 mL/hour NS, in addition for below  Found down, possible syncopal event  AMS: Acute, improving.  Alert, oriented to person, place, and month, but not year. No focal neuro deficits on exam.  MRI brain and EEG normal. Etiology still unclear, may be secondary to bacteremia as above, vs B12 deficiency vs micturition syncopal event.  Will try to reach out to family to obtain further history. -Neurology signed off -Monitor EKG -PT/OT recommending SNF -Treating bacteremia and repleting vitamin B12 -Vitals per routine  B12 deficiency:  B12 108. Potentially contributing to initial presentation as above. - Replete with 1000 mcg B12 injection weekly, completed on 12/19   AKI:  Improving. CR 1.25 from Cr 1.42 yesterday.  Baseline appears around 1.0.  FENa 0.8, prerenal etiology. -Continue NS at 100 mL/hour, especially in the setting of receiving oral and IV contrast this morning for CT -Monitor BMP - Avoid nephrotoxic medications  Hypertension: Chronic, Stable. SBP 140-160s.   -Continue home amlodipine 5 mg - Monitor BP  Previous CVA/TIA: Chronic, stable. CT head showing chronic lacunar infarct in L thalamus and R external capsule infarct. - Continue home aspirin 325 - Cont home atorvastatin 80 mg  FEN/GI: heart healthy diet Prophylaxis: Lovenox   Disposition: Continue care for bacteremia, following up on CT and blood cultures  Subjective:  Doing well this morning, no complaints.  Denies any chest pain, shortness of breath, abdominal pain, or change in bowel movements.  Objective: Temp:  [97.8 F (36.6 C)-98.6 F (37 C)] 98.3 F (36.8 C) (12/20 0735) Pulse Rate:  [62-69] 62 (12/20 0735) Resp:  [16-20] 20 (12/20 0735) BP: (139-162)/(78-98) 162/78 (12/20 0735) SpO2:  [97 %-100 %] 98 % (12/20 0735) Physical Exam: General: Alert, NAD HEENT: NCAT, MMM Cardiac: RRR no m/g/r Lungs: Clear bilaterally, no increased WOB  Abdomen: soft, non-tender, non-distended, normoactive BS Msk: Moves all extremities spontaneously  Ext: Warm, dry, 2+ distal pulses, no edema  Neuro: Alert and oriented to person, place, and month but not year.  Able to follow commands, speech appropriate.  EOMI.  No focal neuro deficits on exam.  Laboratory: Recent Labs  Lab 09/22/18 0235 09/23/18 0510 09/24/18 0500  WBC 9.9 5.9 4.9  HGB 14.6 14.2 14.4  HCT 42.5 41.5 43.1  PLT 131* 113* 108*   Recent Labs  Lab 09/21/18 1232  09/22/18 0235 09/23/18  0510 09/24/18 0500  NA 137   < > 139 136 136  K 4.1   < > 4.3 3.7 4.2  CL 102   < > 105 105 106  CO2 22  --  26 21* 19*  BUN 10   < > 10 15 13   CREATININE 1.54*   < > 1.39* 1.42* 1.25*  CALCIUM 9.2  --  8.6* 8.5* 8.4*   PROT 7.5  --  6.8  --   --   BILITOT 1.2  --  1.1  --   --   ALKPHOS 75  --  64  --   --   ALT 17  --  16  --   --   AST 21  --  23  --   --   GLUCOSE 89   < > 96 94 94   < > = values in this interval not displayed.    Imaging/Diagnostic Tests: Dg Chest 2 View  Result Date: 09/22/2018 CLINICAL DATA:  Fever EXAM: CHEST - 2 VIEW COMPARISON:  09/21/2018 FINDINGS: Heart is borderline in size. No confluent airspace opacities, effusions or edema. No acute bony abnormality. IMPRESSION: No active cardiopulmonary disease. Electronically Signed   By: Charlett NoseKevin  Dover M.D.   On: 09/22/2018 09:57   Dg Tibia/fibula Right  Result Date: 09/21/2018 CLINICAL DATA:  Osteomyelitis to right lower leg. Hx of TIA, hypertension. EXAM: RIGHT TIBIA AND FIBULA - 2 VIEW COMPARISON:  None. FINDINGS: There is no evidence of fracture or other focal bone lesions. Soft tissues are unremarkable. No subcutaneous gas. Popliteal and tibial arterial calcifications. IMPRESSION: 1. No acute bone abnormality. 2. Arterial calcifications. Electronically Signed   By: Corlis Leak  Hassell M.D.   On: 09/21/2018 17:26    Allayne StackBeard, Tamarra Geiselman N, DO 09/24/2018, 9:24 AM PGY-1,  Family Medicine FPTS Intern pager: [phone], text pages welcome

## 2018-09-24 NOTE — Progress Notes (Signed)
Physical Therapy Treatment Patient Details Name: Austin Weaver Mosso MRN: [id number] DOB: 08-Oct-1945 Today's Date: 09/24/2018    History of Present Illness Austin Weaver Nazari is a 72 y.o. male presenting after being found down by family in the bathroom, initially with slurred speech and extremity weakness on arrival, and also found to have a fever of 102.3 and tachycardic concerning for infection. PMH is significant for hypertension, history of TIA, and previous CVA.     PT Comments    Nice improvement from last session.  Still unsteady, but shows to have more stamina.   Follow Up Recommendations  SNF;Other (comment)(progressing well, may improve and not need SNF)     Equipment Recommendations  Other (comment)(TBA)    Recommendations for Other Services       Precautions / Restrictions Precautions Precautions: Fall    Mobility  Bed Mobility Overal bed mobility: Needs Assistance Bed Mobility: Supine to Sit     Supine to sit: Min assist     General bed mobility comments: assisted trunk forward.  Transfers Overall transfer level: Needs assistance Equipment used: Rolling walker (2 wheeled) Transfers: Sit to/from Stand Sit to Stand: Min assist         General transfer comment: assist to come forward`  Ambulation/Gait Ambulation/Gait assistance: Min assist Gait Distance (Feet): 200 Feet Assistive device: Rolling walker (2 wheeled) Gait Pattern/deviations: Step-through pattern Gait velocity: slower Gait velocity interpretation: 1.31 - 2.62 ft/sec, indicative of limited community ambulator General Gait Details: more normalized steps, still mildly unsteady with occasional listing and drifting to the right.   Stairs             Wheelchair Mobility    Modified Rankin (Stroke Patients Only)       Balance Overall balance assessment: Needs assistance Sitting-balance support: Feet supported Sitting balance-Leahy Scale: Fair       Standing balance-Leahy  Scale: Poor Standing balance comment: reliant on UE's or external support, improving                            Cognition Arousal/Alertness: Awake/alert Behavior During Therapy: WFL for tasks assessed/performed Overall Cognitive Status: No family/caregiver present to determine baseline cognitive functioning                                 General Comments: NT formally, but notably improved      Exercises      General Comments        Pertinent Vitals/Pain Pain Assessment: No/denies pain    Home Living                      Prior Function            PT Goals (current goals can now be found in the care plan section) Acute Rehab PT Goals PT Goal Formulation: With patient Time For Goal Achievement: 10/06/18 Potential to Achieve Goals: Good Progress towards PT goals: Progressing toward goals    Frequency    Min 3X/week      PT Plan Current plan remains appropriate    Co-evaluation              AM-PAC PT "6 Clicks" Mobility   Outcome Measure  Help needed turning from your back to your side while in a flat bed without using bedrails?: A Little Help needed moving from lying on your back to sitting on  the side of a flat bed without using bedrails?: A Little Help needed moving to and from a bed to a chair (including a wheelchair)?: A Little Help needed standing up from a chair using your arms (e.g., wheelchair or bedside chair)?: A Little Help needed to walk in hospital room?: A Little Help needed climbing 3-5 steps with a railing? : A Little 6 Click Score: 18    End of Session   Activity Tolerance: Patient tolerated treatment well Patient left: in chair;with call bell/phone within reach;with chair alarm set Nurse Communication: Mobility status PT Visit Diagnosis: Unsteadiness on feet (R26.81);Muscle weakness (generalized) (M62.81)     Time: [id number] PT Time Calculation (min) (ACUTE ONLY): 19 min  Charges:  $Gait  Training: 8-22 mins                     09/24/2018  [place] BingKen Natalie Mceuen, PT Acute Rehabilitation Services [phone]  (pager) [phone]  (office)   Eliseo GumKenneth V Nikolos Billig 09/24/2018, 6:46 PM

## 2018-09-24 NOTE — Progress Notes (Signed)
      INFECTIOUS DISEASE ATTENDING ADDENDUM:   Date: 09/24/2018  Patient name: Austin Weaver  Medical record number: [id number]  Date of birth: 01-18-1946   CT of the abdomen pelvis was unrevealing for source of the patient's polymicrobial bacteremia.  I would finish out a total of 7 days of antibiotics including the IV antibiotics he is received with ciprofloxacin.  I will sign off for now please call with further questions.   Acey LavCornelius Van Dam 09/24/2018, 4:43 PM

## 2018-09-25 DIAGNOSIS — F0281 Dementia in other diseases classified elsewhere with behavioral disturbance: Secondary | ICD-10-CM

## 2018-09-25 DIAGNOSIS — G309 Alzheimer's disease, unspecified: Secondary | ICD-10-CM

## 2018-09-25 LAB — CBC WITH DIFFERENTIAL/PLATELET
ABS IMMATURE GRANULOCYTES: 0.03 10*3/uL (ref 0.00–0.07)
Basophils Absolute: 0 10*3/uL (ref 0.0–0.1)
Basophils Relative: 0 %
Eosinophils Absolute: 0.2 10*3/uL (ref 0.0–0.5)
Eosinophils Relative: 3 %
HCT: 43.6 % (ref 39.0–52.0)
HEMOGLOBIN: 15 g/dL (ref 13.0–17.0)
Immature Granulocytes: 1 %
Lymphocytes Relative: 32 %
Lymphs Abs: 1.5 10*3/uL (ref 0.7–4.0)
MCH: 30.5 pg (ref 26.0–34.0)
MCHC: 34.4 g/dL (ref 30.0–36.0)
MCV: 88.8 fL (ref 80.0–100.0)
Monocytes Absolute: 0.8 10*3/uL (ref 0.1–1.0)
Monocytes Relative: 16 %
NEUTROS ABS: 2.3 10*3/uL (ref 1.7–7.7)
NEUTROS PCT: 48 %
Platelets: 128 10*3/uL — ABNORMAL LOW (ref 150–400)
RBC: 4.91 MIL/uL (ref 4.22–5.81)
RDW: 14.1 % (ref 11.5–15.5)
WBC: 4.9 10*3/uL (ref 4.0–10.5)
nRBC: 0 % (ref 0.0–0.2)

## 2018-09-25 LAB — BASIC METABOLIC PANEL
Anion gap: 9 (ref 5–15)
BUN: 9 mg/dL (ref 8–23)
CO2: 23 mmol/L (ref 22–32)
Calcium: 8.6 mg/dL — ABNORMAL LOW (ref 8.9–10.3)
Chloride: 105 mmol/L (ref 98–111)
Creatinine, Ser: 1.19 mg/dL (ref 0.61–1.24)
GFR calc Af Amer: >60 mL/min (ref >60)
GFR calc non Af Amer: >60 mL/min (ref >60)
Glucose, Bld: 95 mg/dL (ref 70–99)
Potassium: 3.8 mmol/L (ref 3.5–5.1)
Sodium: 137 mmol/L (ref 135–145)

## 2018-09-25 MED ORDER — AMLODIPINE BESYLATE 10 MG PO TABS
10.0000 mg | ORAL_TABLET | Freq: Every day | ORAL | Status: DC
  Administered 2018-09-26 – 2018-09-27 (×2): 10 mg via ORAL
  Filled 2018-09-25 (×2): qty 1

## 2018-09-25 MED ORDER — AMLODIPINE BESYLATE 5 MG PO TABS
5.0000 mg | ORAL_TABLET | Freq: Every day | ORAL | Status: AC
  Administered 2018-09-25: 5 mg via ORAL
  Filled 2018-09-25: qty 1

## 2018-09-25 NOTE — Progress Notes (Signed)
Family Medicine Teaching Service Daily Progress Note Intern Pager: [phone]  Patient name: Austin Weaver Medical record number: [id number] Date of birth: [birth year] Age: 72 y.o. Gender: male  Primary Care Provider: Patient, No Pcp Per Consultants: Neuro Code Status: Full  Assessment and Plan: Austin Weaver is a 72 y.o. male presenting after being found down by family in the bathroom, initially with slurred speech and extremity weakness on arrival, and also found to have a fever of 102.3 and tachycardic concerning for infection. PMH is significant for hypertension, history of TIA, and previous CVA.   Sepsis 2/2 gram negative rod (E.coli/Pseudomonas) bacteremia:  Acute.  Last fever on 12/17 at 1300, VSS.  Asymptomatic. CT abdomen unremarkable for source of infection.  Blood cultures on 12/20 no growth< 24 hours. Etiology of bacteremia remains unclear, no evidence of source via urine, pulmonary, or Derm.  ID signed off, recommending a total of 7-day antibiotics. -Continue oral ciprofloxacin (day 4/7)  - Monitor blood cultures, repeat blood cultures pending -Monitor fever curve, vitals  -Tylenol as needed for fever  Found down, possible syncopal event  AMS: Acute, improving.  Alert and oriented x3.  No focal neuro deficits on exam.  Etiology still unclear, likely secondary to B12 deficiency vs micturition syncopal event.   -Neurology signed off -PT/OT recommending SNF -Treating bacteremia and repleting vitamin B12 -Vitals per routine  B12 deficiency:  B12 108. Potentially contributing to initial presentation as above. - Replete with 1000 mcg B12 injection weekly, completed on 12/19   AKI: Improving. CR 1.19, from 1.25 yesterday.  Near baseline.  FENa 0.8, prerenal etiology. -Monitor BMP - Avoid nephrotoxic medications  Hypertension: Chronic, Stable. SBP 140-160s.   -Increase home amlodipine 5 mg to 10 mg  - Monitor BP  Previous CVA/TIA: Chronic, stable. CT head  showing chronic lacunar infarct in L thalamus and R external capsule infarct. - Continue home aspirin 325 - Cont home atorvastatin 80 mg  FEN/GI: heart healthy diet  Prophylaxis: Lovenox   Disposition: monitor blood cultures for 48 hours, anticipate medically stable in next day for SNF placement   Subjective:  Doing well this morning, denies any chest pain, shortness of breath, abdominal pain.   Objective: Temp:  [98 F (36.7 C)-98.6 F (37 C)] 98.6 F (37 C) (12/21 0720) Pulse Rate:  [57-69] 59 (12/21 0720) Resp:  [16-20] 16 (12/21 0720) BP: (141-164)/(77-92) 163/89 (12/21 0720) SpO2:  [96 %-100 %] 98 % (12/21 0720) Physical Exam: General: Alert, NAD HEENT: NCAT, MMM, oropharynx nonerythematous  Cardiac: RRR no m/g/r Lungs: Clear bilaterally, no increased WOB  Abdomen: soft, non-tender, non-distended, normoactive BS Msk: Moves all extremities spontaneously  Ext: Warm, dry, 2+ distal pulses, no edema  Neuro: Alert and oriented to person, place, and month/year with some assistance.  He did follow commands, speech appropriate.  EOMI.  No focal neuro deficits on exam.  Laboratory: Recent Labs  Lab 09/23/18 0510 09/24/18 0500 09/25/18 0550  WBC 5.9 4.9 4.9  HGB 14.2 14.4 15.0  HCT 41.5 43.1 43.6  PLT 113* 108* 128*   Recent Labs  Lab 09/21/18 1232  09/22/18 0235 09/23/18 0510 09/24/18 0500 09/25/18 0550  NA 137   < > 139 136 136 137  K 4.1   < > 4.3 3.7 4.2 3.8  CL 102   < > 105 105 106 105  CO2 22  --  26 21* 19* 23  BUN 10   < > 10 15 13 9   CREATININE 1.54*   < >  1.39* 1.42* 1.25* 1.19  CALCIUM 9.2  --  8.6* 8.5* 8.4* 8.6*  PROT 7.5  --  6.8  --   --   --   BILITOT 1.2  --  1.1  --   --   --   ALKPHOS 75  --  64  --   --   --   ALT 17  --  16  --   --   --   AST 21  --  23  --   --   --   GLUCOSE 89   < > 96 94 94 95   < > = values in this interval not displayed.    Imaging/Diagnostic Tests: Dg Chest 2 View  Result Date: 09/22/2018 CLINICAL DATA:   Fever EXAM: CHEST - 2 VIEW COMPARISON:  09/21/2018 FINDINGS: Heart is borderline in size. No confluent airspace opacities, effusions or edema. No acute bony abnormality. IMPRESSION: No active cardiopulmonary disease. Electronically Signed   By: Charlett NoseKevin  Dover M.D.   On: 09/22/2018 09:57   Dg Tibia/fibula Right  Result Date: 09/21/2018 CLINICAL DATA:  Osteomyelitis to right lower leg. Hx of TIA, hypertension. EXAM: RIGHT TIBIA AND FIBULA - 2 VIEW COMPARISON:  None. FINDINGS: There is no evidence of fracture or other focal bone lesions. Soft tissues are unremarkable. No subcutaneous gas. Popliteal and tibial arterial calcifications. IMPRESSION: 1. No acute bone abnormality. 2. Arterial calcifications. Electronically Signed   By: Corlis Leak  Hassell M.D.   On: 09/21/2018 17:26    Allayne StackBeard, Issaih Kaus N, DO 09/25/2018, 7:56 AM PGY-1, [facility] FPTS Intern pager: [phone], text pages welcome

## 2018-09-25 NOTE — Progress Notes (Signed)
Staffs assisted patient to chair with 1 plus assist but throughout shift, pt able to ambulate in hall and in room with stand by assist. Patient voices that he wishes to go home when he the doctor discharge him. No other distress voice. Will continue to monitor.  Sim BoastHavy, RN

## 2018-09-26 DIAGNOSIS — G308 Other Alzheimer's disease: Secondary | ICD-10-CM

## 2018-09-26 LAB — CBC WITH DIFFERENTIAL/PLATELET
Abs Immature Granulocytes: 0.04 10*3/uL (ref 0.00–0.07)
Basophils Absolute: 0 10*3/uL (ref 0.0–0.1)
Basophils Relative: 1 %
EOS PCT: 2 %
Eosinophils Absolute: 0.1 10*3/uL (ref 0.0–0.5)
HCT: 43.7 % (ref 39.0–52.0)
Hemoglobin: 14.8 g/dL (ref 13.0–17.0)
Immature Granulocytes: 1 %
Lymphocytes Relative: 18 %
Lymphs Abs: 1 10*3/uL (ref 0.7–4.0)
MCH: 30.1 pg (ref 26.0–34.0)
MCHC: 33.9 g/dL (ref 30.0–36.0)
MCV: 89 fL (ref 80.0–100.0)
Monocytes Absolute: 0.8 10*3/uL (ref 0.1–1.0)
Monocytes Relative: 15 %
Neutro Abs: 3.4 10*3/uL (ref 1.7–7.7)
Neutrophils Relative %: 63 %
Platelets: 125 10*3/uL — ABNORMAL LOW (ref 150–400)
RBC: 4.91 MIL/uL (ref 4.22–5.81)
RDW: 13.9 % (ref 11.5–15.5)
WBC: 5.2 10*3/uL (ref 4.0–10.5)
nRBC: 0 % (ref 0.0–0.2)

## 2018-09-26 LAB — BASIC METABOLIC PANEL
Anion gap: 11 (ref 5–15)
BUN: 9 mg/dL (ref 8–23)
CO2: 22 mmol/L (ref 22–32)
Calcium: 8.7 mg/dL — ABNORMAL LOW (ref 8.9–10.3)
Chloride: 104 mmol/L (ref 98–111)
Creatinine, Ser: 1.23 mg/dL (ref 0.61–1.24)
GFR calc Af Amer: >60 mL/min (ref >60)
GFR, EST NON AFRICAN AMERICAN: 59 mL/min — AB (ref >60)
Glucose, Bld: 94 mg/dL (ref 70–99)
Potassium: 4 mmol/L (ref 3.5–5.1)
Sodium: 137 mmol/L (ref 135–145)

## 2018-09-26 LAB — CULTURE, BLOOD (ROUTINE X 2): Culture: NO GROWTH

## 2018-09-26 NOTE — Progress Notes (Signed)
Family Medicine Teaching Service Daily Progress Note Intern Pager: [phone]  Patient name: Austin CopaWillie Weaver Medical record number: [id number] Date of birth: [birth year] Age: 72 y.o. Gender: male  Primary Care Provider: Patient, No Pcp Per Consultants: neuro, ID Code Status: full  Pt Overview and Major Events to Date:  12/17- admitted to hospital for AMS  Assessment and Plan: Austin QuaWillie Williamsonis a 72 y.o.malepresenting after being found down by family in the bathroom,initially with slurred speech and extremity weakness on arrival,and also found to have a fever of 102.3 and tachycardic concerning for infection. PMH is significant forhypertension,history of TIA,andprevious CVA.   Gram negative rod (E.coli/Pseudomonas) bacteremia:Acute.  Last fever on 12/17 at 1300, VSS.  Asymptomatic. CT abdomen unremarkable for source of infection.  Blood cultures taken on 12/20 show no growth 2 days. -Continue oral ciprofloxacin (12/20- 12/24) per ID recommendations - Monitor repeat blood cultures -Monitor fever curve, vitals  -Tylenol as needed for fever  Syncopal event  AMS: Acute, improving.  Alert and oriented x3 today.  No focal neuro deficits on exam. -Neurology signed off -PT/OT recommending SNF -Treating bacteremia and repleting vitamin B12 -Vitals per routine  B12 deficiency:  B12 108. Potentially contributing to initial presentation as above. - Replete with 1000 mcg B12 injection weekly, completed on 12/19  AKI: resolved CR 1.23 today -Monitor BMP - Avoid nephrotoxic medications - removing condom catheter today  Hypertension:Chronic, unstable. SBP 132-189 overnight. DBP 69-88 overnight   -Increase home amlodipine 5 mg to 10 mg yesterday - Monitor BP  Previous CVA/TIA:Chronic, stable. CT head showing chronic lacunar infarct inLthalamus andRexternal capsule infarct. Patient appeared to have difficulty swallowing pill this am. - Continue home aspirin 325 -  Cont home atorvastatin 80 mg - ordering speech consult today  FEN/GI: heart healthy diet  Prophylaxis:Lovenox  Disposition: discharge to SNF when bed available. Patient and [facility], and [facility]  Subjective:  Patient very pleasant. Sitting in chair. He has no complaint today. He states that he is doing well and has no difficulty with breathing or swallowing. He does appear to have difficulty with swallowing his medications. He has a condom catheter in place but denies needing help to go to bathroom independently at baseline.   Objective: Temp:  [97.8 F (36.6 C)-98.5 F (36.9 C)] 98 F (36.7 C) (12/22 0736) Pulse Rate:  [56-75] 57 (12/22 0736) Resp:  [16-20] 20 (12/22 0736) BP: (132-189)/(69-88) 137/83 (12/22 0736) SpO2:  [98 %-100 %] 98 % (12/22 0736) Physical Exam: General: pleasant, slightly confused or difficult to understand speech Cardiovascular: RRR, no murmur appreciated Respiratory: CTAB, no wheezing or increased WOB Abdomen: softly obese, non-tender to palpation, active BS diffusely Extremities: no extremity edema. Dry skin   Laboratory: Recent Labs  Lab 09/24/18 0500 09/25/18 0550 09/26/18 0432  WBC 4.9 4.9 5.2  HGB 14.4 15.0 14.8  HCT 43.1 43.6 43.7  PLT 108* 128* 125*   Recent Labs  Lab 09/21/18 1232  09/22/18 0235  09/24/18 0500 09/25/18 0550 09/26/18 0432  NA 137   < > 139   < > 136 137 137  K 4.1   < > 4.3   < > 4.2 3.8 4.0  CL 102   < > 105   < > 106 105 104  CO2 22  --  26   < > 19* 23 22  BUN 10   < > 10   < > 13 9 9   CREATININE 1.54*   < >  1.39*   < > 1.25* 1.19 1.23  CALCIUM 9.2  --  8.6*   < > 8.4* 8.6* 8.7*  PROT 7.5  --  6.8  --   --   --   --   BILITOT 1.2  --  1.1  --   --   --   --   ALKPHOS 75  --  64  --   --   --   --   ALT 17  --  16  --   --   --   --   AST 21  --  23  --   --   --   --   GLUCOSE 89   < > 96   < > 94 95 94   < > = values in this interval not  displayed.    Imaging/Diagnostic Tests: No results found.   Leeroy BockAnderson, Cristiana Yochim L, DO 09/26/2018, 8:26 AM PGY-1, [facility] FPTS Intern pager: [phone], text pages welcome

## 2018-09-26 NOTE — Evaluation (Signed)
Clinical/Bedside Swallow Evaluation Patient Details  Name: Austin Weaver MRN: [id number] Date of Birth: [birth year]  Today's Date: 09/26/2018 Time: SLP Start Time (ACUTE ONLY): 1145 SLP Stop Time (ACUTE ONLY): 1203 SLP Time Calculation (min) (ACUTE ONLY): 18 min  Past Medical History:  Past Medical History:  Diagnosis Date  . Fall 09/21/2018  . History of transient ischemic attack (TIA) 11/20/2017  . History of transient ischemic attack (TIA) 11/20/2017  . Hypertension   . Old Cerebral infarction (HCC) 09/21/2018   Remote L. thalamic and R. external capsul & corona radiata infarcts   Past Surgical History:  Past Surgical History:  Procedure Laterality Date  . laporotomy     HPI:   Mr. Clinton SawyerWilliamson is a 72 y.o. male presenting after being found down by family in the bathroom, admitted with sepsis 2/2 gram negative bacteremia, altered mental status. CXR 09/22/18 negative for active cardiopulmonary disease. PMH is significant for hypertension, history of TIA, and previous CVA. Pt passed stroke swallow screen during admission for CVA (11/2017) and was not evaluated by SLP. Swallow evaluation ordered as MD noted difficulty swallowing with med administration.   Assessment / Plan / Recommendation Clinical Impression   Patient presents with functional oropharyngeal swallow given absence of teeth, with appearance of adequate airway protection with thin liquids, purees and regular solids. Cranial nerve examination is noted for mild left facial asymmetry, but adequate strength. Mastication is prolonged but functional, and pt uses sips of thin liquids to moisten and ultimately clear solids completely, with appearance of good coordination with mixed consistencies. He reports he eats regular, and sometimes softer foods. Pt did state that sometimes he has difficulty with pills with water; SLP recommends taking meds whole in puree to facilitate better bolus cohesion. Pt may continue current diet, as he is  able to self-select softer foods if he prefers them. Discussed with RN. No further skilled ST needs identified; SLP will s/o.    SLP Visit Diagnosis: Dysphagia, unspecified (R13.10)    Aspiration Risk  Mild aspiration risk    Diet Recommendation Regular;Dysphagia 3 (Mech soft);Thin liquid   Liquid Administration via: Cup;Straw Medication Administration: Whole meds with puree Supervision: Patient able to self feed Compensations: Slow rate;Small sips/bites;Follow solids with liquid Postural Changes: Seated upright at 90 degrees    Other  Recommendations Oral Care Recommendations: Oral care BID   Follow up Recommendations Skilled Nursing facility      Frequency and Duration            Prognosis Prognosis for Safe Diet Advancement: Good      Swallow Study   General Date of Onset: 09/21/18 HPI:  Mr. Clinton SawyerWilliamson is a 72 y.o. male presenting after being found down by family in the bathroom, admitted with sepsis 2/2 gram negative bacteremia, altered mental status. CXR 09/22/18 negative for active cardiopulmonary disease. PMH is significant for hypertension, history of TIA, and previous CVA. Pt passed stroke swallow screen during admission for CVA (11/2017) and was not evaluated by SLP. Swallow evaluation ordered as MD noted difficulty swallowing with med administration. Type of Study: Bedside Swallow Evaluation Previous Swallow Assessment: none in chart Diet Prior to this Study: Regular;Thin liquids Temperature Spikes Noted: No Respiratory Status: Room air History of Recent Intubation: No Behavior/Cognition: Alert;Cooperative Oral Cavity Assessment: Within Functional Limits Oral Care Completed by SLP: No Oral Cavity - Dentition: Edentulous Vision: Functional for self-feeding Self-Feeding Abilities: Able to feed self Patient Positioning: Upright in chair Baseline Vocal Quality: Normal Volitional Cough: Strong Volitional Swallow: Able to  elicit    Oral/Motor/Sensory Function  Overall Oral Motor/Sensory Function: Mild impairment Facial ROM: Reduced left;Suspected CN VII (facial) dysfunction Facial Symmetry: Abnormal symmetry left;Suspected CN VII (facial) dysfunction Facial Strength: Within Functional Limits Facial Sensation: Within Functional Limits Lingual ROM: Within Functional Limits Lingual Symmetry: Within Functional Limits Lingual Strength: Within Functional Limits Velum: Within Functional Limits Mandible: Within Functional Limits   Ice Chips Ice chips: Not tested   Thin Liquid Thin Liquid: Within functional limits Presentation: Cup;Straw;Self Fed    Nectar Thick Nectar Thick Liquid: Not tested   Honey Thick Honey Thick Liquid: Not tested   Puree Puree: Within functional limits Presentation: Self Fed;Spoon   Solid     Solid: Impaired Presentation: Self Fed;Spoon Oral Phase Impairments: Impaired mastication Oral Phase Functional Implications: Impaired mastication;Oral residue     Rondel BatonMary Beth Sevyn Markham, MS, CCC-SLP Speech-Language Pathologist Acute Rehabilitation Services Pager: [phone] Office: [phone]  Arlana LindauMary E Breslin Hemann 09/26/2018,12:08 PM

## 2018-09-26 NOTE — Discharge Summary (Signed)
Family Medicine Teaching [facility] Discharge Summary  Patient name: Austin Weaver Medical record number: [id number] Date of birth: Feb 17, 1946 Age: 72 y.o. Gender: male Date of Admission: 09/21/2018  Date of Discharge: 12/23 Admitting Physician: Leighton Roachodd D McDiarmid, MD/  Primary Care Provider: Patient, No Pcp Per Consultants: neuro, ID  Indication for Hospitalization: AMS, bacteremia  Discharge Diagnoses/Problem List:  Bacteremia (E.coli, Pseudomonas), improving   AMS, resolved  Hyperlipidemia  Chronic hypertension B12 deficiency Previous CVA  Disposition: discharge to home with home health. PT recommended SNF placement, but patient declined.   Discharge Condition: Stable, improved  Discharge Exam:  General: NAD, pleasant, able to participate in exam Cardiac: RRR, normal heart sounds, no murmurs. 2+ radial and PT pulses bilaterally Respiratory: CTAB, normal effort, No wheezes, rales or rhonchi Abdomen: soft, nontender, nondistended, no hepatic or splenomegaly, +BS Extremities: no edema or cyanosis. WWP. Skin: warm and dry, no rashes noted Neuro: alert and oriented x3, no focal deficits Psych: Normal affect and mood, has speech impediment (stutter)  Brief Hospital Course:  Austin Weaver is a 72 year old gentleman that presented altered with slurred speech and extremity weakness after being found down in his home restroom possibly 2/2 a syncopal event, and ultimately found to have pseudomonas and E. Coli bacteremia.   Found down, possible syncopal event  AMS: Neurology consulted, ruled out CVA and seizure via MRI brain and EEG evaluation. BP remained stable. Electrolytes/TSH/UDS and glucose wnl, with exception of B12 found to be low at 108-may have been contributing. Unlikely cardiac, Echo wnl, troponin neg, EKG sinus. Episode thought to be 2/2 possibly micturition syncopal event vs infectious (bacteremia as below) vs B12 deficiency vs orthostatics.  No further events  since admission. B12 was replenished during admission. At discharge, he was alert and oriented x3 without any focal neuro deficits.  Pseudomonas and E Coli Bacteremia: Initially presented tachycardic and febrile to 102.64F. 1/2 blood cultures grew gram neg rods without clear source and not thought to be contaminants. Pulmonary, renal/urinary, abdominal, and derm etiologies ruled out. ID consulted, recommended 7 days total of antibiotics. Managed on IV cefepime for 72 hours, transitioned to oral ciprofloxacin for the remaining days (both bacteria sensitive) to be completed on 12/24. Repeat blood cultures negative >2 days. At discharge, he was hemodynamically stable and afebrile since 12/17.  Issues for Follow Up:  1. F/u Bacteremia improved on ciprofloxacin (12/20-12/24) 2. B12 108. Started on B12 1000 mcg injections on 12/19. Please continue with weekly injections, until B12 wnl, followed with monthly replacement.  3. Follow up with BP. Managed on his home amlodipine, increased to 10mg  during his stay. Remained intermittently elevated of SBP 130-150s. 4. Patient currently on ASA 325mg  regimen (remote history CVA) but was admitted after fall so bleeding risk may be increased. Risks/benefit conversation with patient and family. 5. Patient discharging home with home health aide, nurse, PT and lives with 2 women who help him with care.  Significant Procedures: EEG, EKG, CT abdomen/pelvis, US renal, MR brain, xray chest/Rtib/fib, CT head  Significant Labs and Imaging:  Recent Labs  Lab 09/24/18 0500 09/25/18 0550 09/26/18 0432  WBC 4.9 4.9 5.2  HGB 14.4 15.0 14.8  HCT 43.1 43.6 43.7  PLT 108* 128* 125*   Recent Labs  Lab 09/21/18 1232  09/21/18 1857 09/22/18 0235 09/23/18 0510 09/24/18 0500 09/25/18 0550 09/26/18 0432  NA 137   < >  --  139 136 136 137 137  K 4.1   < >  --  4.3 3.7 4.2  3.8 4.0  CL 102   < >  --  105 105 106 105 104  CO2 22  --   --  26 21* 19* 23 22  GLUCOSE 89   < >   --  96 94 94 95 94  BUN 10   < >  --  10 15 13 9 9   CREATININE 1.54*   < > 1.50*  1.45* 1.39* 1.42* 1.25* 1.19 1.23  CALCIUM 9.2  --   --  8.6* 8.5* 8.4* 8.6* 8.7*  MG  --   --  1.8  --  2.3  --   --   --   PHOS  --   --  2.5  --   --   --   --   --   ALKPHOS 75  --   --  64  --   --   --   --   AST 21  --   --  23  --   --   --   --   ALT 17  --   --  16  --   --   --   --   ALBUMIN 4.1  --   --  3.5  --   --   --   --    < > = values in this interval not displayed.    Ct Abdomen Pelvis W Contrast  Result Date: 09/24/2018 CLINICAL DATA:  Bacteremia EXAM: CT ABDOMEN AND PELVIS WITH CONTRAST TECHNIQUE: Multidetector CT imaging of the abdomen and pelvis was performed using the standard protocol following bolus administration of intravenous contrast. CONTRAST:  OMNIPAQUE IOHEXOL 300 MG/ML  SOLN COMPARISON:  Renal ultrasound 09/22/2018 FINDINGS: Lower chest: Coronary artery calcifications.  No acute abnormality. Hepatobiliary: No focal hepatic abnormality. Gallbladder unremarkable. Pancreas: No focal abnormality or ductal dilatation. Spleen: No focal abnormality.  Normal size. Adrenals/Urinary Tract: Bilateral renal cysts. Punctate nonobstructing stone in the lower pole of the left kidney. No ureteral stones or hydronephrosis. Adrenal glands and urinary bladder unremarkable. Stomach/Bowel: Sigmoid diverticulosis. No active diverticulitis. Stomach and small bowel decompressed, unremarkable. Vascular/Lymphatic: Aortic atherosclerosis. No enlarged abdominal or pelvic lymph nodes. Reproductive: No visible focal abnormality. Other: No free fluid or free air. Musculoskeletal: Fusion across the SI joints. Degenerative changes in the lower lumbar spine. No acute bony abnormality. IMPRESSION: Scattered sigmoid diverticulosis.  No active diverticulitis. Bilateral renal cysts. Aortic atherosclerosis, coronary artery disease. No acute findings in the abdomen or pelvis. Electronically Signed   By: Charlett Nose  M.D.   On: 09/24/2018 11:02    Results/Tests Pending at Time of Discharge: none  Discharge Medications:  Allergies as of 09/27/2018   No Known Allergies     Medication List    TAKE these medications   amLODipine 10 MG tablet Commonly known as:  NORVASC Take 1 tablet (10 mg total) by mouth daily. Start taking on:  September 28, 2018 What changed:    medication strength  how much to take   aspirin 325 MG tablet Take 1 tablet (325 mg total) by mouth daily.   atorvastatin 80 MG tablet Commonly known as:  LIPITOR Take 1 tablet (80 mg total) by mouth daily at 6 PM.   ciprofloxacin 500 MG tablet Commonly known as:  CIPRO Take 1 tablet (500 mg total) by mouth 2 (two) times daily for 1 day.       Discharge Instructions: Please refer to Patient Instructions section of EMR for full details.  Patient was counseled important signs and symptoms that should prompt return to medical care, changes in medications, dietary instructions, activity restrictions, and follow up appointments.   Follow-Up Appointments:   Leeroy BockAnderson, Mileena Rothenberger L, DO 09/27/2018, 9:44 AM PGY-1, [facility]

## 2018-09-26 NOTE — Progress Notes (Signed)
CSW met with patient at bedside. Patient stated that he would like to go home and be interested in home health.   Domenic Schwab, MSW, [place]

## 2018-09-27 ENCOUNTER — Encounter (HOSPITAL_COMMUNITY): Payer: Self-pay | Admitting: *Deleted

## 2018-09-27 ENCOUNTER — Other Ambulatory Visit: Payer: Self-pay

## 2018-09-27 MED ORDER — AMLODIPINE BESYLATE 10 MG PO TABS: 10.0000 mg | ORAL_TABLET | Freq: Every day | ORAL | 0 refills | Status: DC

## 2018-09-27 MED ORDER — CIPROFLOXACIN HCL 500 MG PO TABS: 500.0000 mg | ORAL_TABLET | Freq: Two times a day (BID) | ORAL | 0 refills | Status: AC

## 2018-09-27 NOTE — Progress Notes (Signed)
Patient and family given d/c instructions and packet, able to verbalize understanding and teach back. No new questions or concerns.   Discharged from unit in wheelchair by staff, family to transport home

## 2018-09-27 NOTE — Progress Notes (Signed)
Physical Therapy Treatment Patient Details Name: Austin Weaver [place] MRN: [id number] DOB: 12/10/1945 Today's Date: 09/27/2018    History of Present Illness Austin Weaver Inglett is a 72 y.o. male presenting after being found down by family in the bathroom, initially with slurred speech and extremity weakness on arrival, and also found to have a fever of 102.3 and tachycardic concerning for infection. PMH is significant for hypertension, history of TIA, and previous CVA.     PT Comments    Pt has made enough progress to safely get home with the available assist he has provided from family and friends.    Follow Up Recommendations  Home health PT;Supervision - Intermittent;Supervision/Assistance - 24 hour     Equipment Recommendations  None recommended by PT    Recommendations for Other Services       Precautions / Restrictions Precautions Precautions: Fall    Mobility  Bed Mobility Overal bed mobility: Modified Independent                Transfers Overall transfer level: Needs assistance   Transfers: Sit to/from Stand Sit to Stand: Supervision         General transfer comment: smooth transitions  Ambulation/Gait Ambulation/Gait assistance: Min guard Gait Distance (Feet): 250 Feet Assistive device: Rolling walker (2 wheeled);None Gait Pattern/deviations: Step-through pattern Gait velocity: moderate Gait velocity interpretation: 1.31 - 2.62 ft/sec, indicative of limited community ambulator General Gait Details: mostly ambulated with no assistive device.  Pt was a bit more unsteady than with the RW, but maintain balance with only minor deviation,  episodes of speeding up out of control   Stairs             Wheelchair Mobility    Modified Rankin (Stroke Patients Only)       Balance Overall balance assessment: Needs assistance   Sitting balance-Leahy Scale: Fair       Standing balance-Leahy Scale: Fair                               Cognition Arousal/Alertness: Awake/alert Behavior During Therapy: WFL for tasks assessed/performed Overall Cognitive Status: No family/caregiver present to determine baseline cognitive functioning                                        Exercises      General Comments General comments (skin integrity, edema, etc.): vss      Pertinent Vitals/Pain Pain Assessment: No/denies pain    Home Living                      Prior Function            PT Goals (current goals can now be found in the care plan section) Acute Rehab PT Goals Patient Stated Goal: regain strength/independence PT Goal Formulation: With patient Time For Goal Achievement: 10/06/18 Potential to Achieve Goals: Good Progress towards PT goals: Progressing toward goals    Frequency    Min 3X/week      PT Plan Current plan remains appropriate    Co-evaluation              AM-PAC PT "6 Clicks" Mobility   Outcome Measure  Help needed turning from your back to your side while in a flat bed without using bedrails?: None Help needed moving from lying on your back to  sitting on the side of a flat bed without using bedrails?: None Help needed moving to and from a bed to a chair (including a wheelchair)?: None Help needed standing up from a chair using your arms (e.g., wheelchair or bedside chair)?: None Help needed to walk in hospital room?: A Little Help needed climbing 3-5 steps with a railing? : A Little 6 Click Score: 22    End of Session   Activity Tolerance: Patient tolerated treatment well Patient left: in chair;with call bell/phone within reach;with bed alarm set Nurse Communication: Mobility status PT Visit Diagnosis: Other abnormalities of gait and mobility (R26.89);Muscle weakness (generalized) (M62.81)     Time: 1106-1130 PT Time Calculation (min) (ACUTE ONLY): 24 min  Charges:  $Gait Training: 8-22 mins $Therapeutic Activity: 8-22 mins                      09/27/2018  [place] BingKen Quintan Saldivar, PT Acute Rehabilitation Services [phone]  (pager) [phone]  (office)   Eliseo GumKenneth V Tymar Polyak 09/27/2018, 11:57 AM

## 2018-09-27 NOTE — Care Management Note (Signed)
Case Management Note  Patient Details  Name: Austin Weaver MRN: [id number] Date of Birth: 1946-04-30  Subjective/Objective:                    Action/Plan: Pt walked in hall today with PT and did well. Pt and family wanting him to d/c home.  [facility] CM provided choice to pts cousin, Coy SaunasRosemary and [facility] selected. Alvino Chapelllen with [facility] notified and accepted the referral. Pt has friends that assist with transportation. CM called Synetta Failnita [phone] and notified her of d/c. She will transport him home around 2 pm. Will updated bedside RN.    Expected Discharge Date:                  Expected Discharge Plan:  Skilled Nursing Facility  In-House Referral:  Clinical Social Work  Discharge planning Services  CM Consult  Post Acute Care Choice:  Home Health Choice offered to:  Patient(Rosemary)  DME Arranged:    DME Agency:     HH Arranged:  RN, PT, OT, Nurse's Aide, Social Work [employer] Agency:  [facility]  Status of Service:  Completed, signed off  If discussed at [employer] Meetings, dates discussed:    Additional Comments:  Kermit BaloKelli F Ranee Peasley, RN 09/27/2018, 11:55 AM

## 2018-09-28 ENCOUNTER — Emergency Department (HOSPITAL_COMMUNITY)
Admission: EM | Admit: 2018-09-28 | Discharge: 2018-09-28 | Disposition: A | Discharge status: Home / Self Care | Payer: Medicare Other | Attending: Emergency Medicine | Admitting: Emergency Medicine

## 2018-09-28 ENCOUNTER — Emergency Department (HOSPITAL_COMMUNITY): Payer: Medicare Other

## 2018-09-28 DIAGNOSIS — I10 Essential (primary) hypertension: Secondary | ICD-10-CM | POA: Diagnosis not present

## 2018-09-28 DIAGNOSIS — F17210 Nicotine dependence, cigarettes, uncomplicated: Secondary | ICD-10-CM | POA: Insufficient documentation

## 2018-09-28 DIAGNOSIS — R509 Fever, unspecified: Secondary | ICD-10-CM | POA: Diagnosis not present

## 2018-09-28 DIAGNOSIS — R531 Weakness: Secondary | ICD-10-CM | POA: Diagnosis not present

## 2018-09-28 DIAGNOSIS — M2550 Pain in unspecified joint: Secondary | ICD-10-CM | POA: Diagnosis not present

## 2018-09-28 DIAGNOSIS — R05 Cough: Secondary | ICD-10-CM | POA: Insufficient documentation

## 2018-09-28 DIAGNOSIS — Z7401 Bed confinement status: Secondary | ICD-10-CM | POA: Diagnosis not present

## 2018-09-28 DIAGNOSIS — R4182 Altered mental status, unspecified: Secondary | ICD-10-CM | POA: Diagnosis not present

## 2018-09-28 DIAGNOSIS — G459 Transient cerebral ischemic attack, unspecified: Secondary | ICD-10-CM | POA: Diagnosis not present

## 2018-09-28 LAB — CBC WITH DIFFERENTIAL/PLATELET
Abs Immature Granulocytes: 0.06 10*3/uL (ref 0.00–0.07)
BASOS PCT: 0 %
Basophils Absolute: 0 10*3/uL (ref 0.0–0.1)
EOS ABS: 0 10*3/uL (ref 0.0–0.5)
Eosinophils Relative: 0 %
HCT: 48.4 % (ref 39.0–52.0)
Hemoglobin: 15.8 g/dL (ref 13.0–17.0)
Immature Granulocytes: 1 %
Lymphocytes Relative: 10 %
Lymphs Abs: 0.7 10*3/uL (ref 0.7–4.0)
MCH: 29.8 pg (ref 26.0–34.0)
MCHC: 32.6 g/dL (ref 30.0–36.0)
MCV: 91.1 fL (ref 80.0–100.0)
Monocytes Absolute: 1.3 10*3/uL — ABNORMAL HIGH (ref 0.1–1.0)
Monocytes Relative: 18 %
Neutro Abs: 5.1 10*3/uL (ref 1.7–7.7)
Neutrophils Relative %: 71 %
PLATELETS: 164 10*3/uL (ref 150–400)
RBC: 5.31 MIL/uL (ref 4.22–5.81)
RDW: 14.4 % (ref 11.5–15.5)
WBC: 7.2 10*3/uL (ref 4.0–10.5)
nRBC: 0 % (ref 0.0–0.2)

## 2018-09-28 LAB — URINALYSIS, ROUTINE W REFLEX MICROSCOPIC
Bacteria, UA: NONE SEEN
Bilirubin Urine: NEGATIVE
Glucose, UA: NEGATIVE mg/dL
Hgb urine dipstick: NEGATIVE
Ketones, ur: NEGATIVE mg/dL
Leukocytes, UA: NEGATIVE
Nitrite: NEGATIVE
Protein, ur: 30 mg/dL — AB
Specific Gravity, Urine: 1.026 (ref 1.005–1.030)
pH: 5 (ref 5.0–8.0)

## 2018-09-28 LAB — BASIC METABOLIC PANEL
ANION GAP: 12 (ref 5–15)
BUN: 19 mg/dL (ref 8–23)
CO2: 22 mmol/L (ref 22–32)
Calcium: 9 mg/dL (ref 8.9–10.3)
Chloride: 103 mmol/L (ref 98–111)
Creatinine, Ser: 1.5 mg/dL — ABNORMAL HIGH (ref 0.61–1.24)
GFR calc Af Amer: 54 mL/min — ABNORMAL LOW (ref >60)
GFR, EST NON AFRICAN AMERICAN: 46 mL/min — AB (ref >60)
Glucose, Bld: 96 mg/dL (ref 70–99)
Potassium: 4.1 mmol/L (ref 3.5–5.1)
Sodium: 137 mmol/L (ref 135–145)

## 2018-09-28 LAB — I-STAT CG4 LACTIC ACID, ED: Lactic Acid, Venous: 1.35 mmol/L (ref 0.5–1.9)

## 2018-09-28 MED ORDER — CIPROFLOXACIN HCL 500 MG PO TABS: 500.0000 mg | ORAL_TABLET | Freq: Two times a day (BID) | ORAL | 0 refills | Status: DC

## 2018-09-28 MED ORDER — ACETAMINOPHEN 325 MG PO TABS
650.0000 mg | ORAL_TABLET | Freq: Once | ORAL | Status: AC
  Administered 2018-09-28: 650 mg via ORAL
  Filled 2018-09-28: qty 2

## 2018-09-28 NOTE — ED Triage Notes (Signed)
BIB EMS from home, pt just DC from hospital yesterday afternoon (12/23 approx 1600) Pt was dx with fever of unknown source. Called out this evening for gen weakness, pt is A/O at baseline, low grade fever.

## 2018-09-28 NOTE — Discharge Instructions (Addendum)
Labs and imaging studies overall reassuring.  Repeat blood cultures were sent today and we will call you if they are abnormal. Infectious disease recommends continuing ciprofloxacin another 5 days which I have written for you. Can continue tylenol/motrin if any continued fever. Follow-up with your primary care doctor. Return here for any new/acute changes.

## 2018-09-28 NOTE — ED Notes (Signed)
Called [facility] for pt transport  

## 2018-09-28 NOTE — ED Provider Notes (Signed)
[facility]   CSN: [id number] Arrival date & time: 09/28/18  0030     History   Chief Complaint Chief Complaint  Patient presents with  . Weakness    HPI Austin Weaver is a 72 y.o. male.  The history is provided by the patient and medical records.  Weakness     72 y.o. M with hx of TIA, HTN, hx of prior CVA, presenting to the ED for generalized weakness.  Patient is a very poor historian, details are limited.  He was discharged from the hospital yesterday morning after admission for generalized weakness and was found to have E. coli and Pseudomonas bacteremia of unknown etiology.  He was treated in the hospital with cefepime and discharged home with ciprofloxacin.  He was scheduled to be placed in a SNF but due to his improvement in the hospital he no longer qualified and patient himself preferred to go home.  He apparently lives at home with his wife.  Patient states today he has just been feeling poorly.  He reports ongoing cough for about 2 weeks now, this is intermittently productive.  He denies any vomiting.  He denies any other pain.    Past Medical History:  Diagnosis Date  . Fall 09/21/2018  . History of transient ischemic attack (TIA) 11/20/2017  . History of transient ischemic attack (TIA) 11/20/2017  . Hypertension   . Old Cerebral infarction (HCC) 09/21/2018   Remote L. thalamic and R. external capsul & corona radiata infarcts    Patient Active Problem List   Diagnosis Date Noted  . Alzheimer's dementia with behavioral disturbance (HCC)   . Encephalopathy in sepsis   . Pseudomonal sepsis (HCC) 09/22/2018  . E. coli sepsis (HCC) 09/22/2018  . Vitamin B12 deficiency 09/22/2018  . CVA (cerebral vascular accident) [facility](HCC), old 09/22/2018  . Gram negative sepsis (HCC)   . Altered mental status, unspecified 09/21/2018  . AMS (altered mental status) 09/21/2018  . AKI (acute kidney injury) (HCC) 09/21/2018  .  Febrile illness 09/21/2018  . SIRS (systemic inflammatory response syndrome) (HCC) 09/21/2018  . Fall 09/21/2018  . Abnormal CXR 09/21/2018  . Old Cerebral infarction (HCC) 09/21/2018  . Fever of undetermined origin   . Weakness     Past Surgical History:  Procedure Laterality Date  . laporotomy          Home Medications    Prior to Admission medications   Medication Sig Start Date End Date Taking? Authorizing Provider  amLODipine (NORVASC) 10 MG tablet Take 1 tablet (10 mg total) by mouth daily. 09/28/18   Jamelle RushingAnderson, Chelsey L, DO  aspirin 325 MG tablet Take 1 tablet (325 mg total) by mouth daily. 11/23/17   Rolly SalterPatel, Pranav M, MD  atorvastatin (LIPITOR) 80 MG tablet Take 1 tablet (80 mg total) by mouth daily at 6 PM. Patient not taking: Reported on 09/21/2018 11/22/17   Rolly SalterPatel, Pranav M, MD  ciprofloxacin (CIPRO) 500 MG tablet Take 1 tablet (500 mg total) by mouth 2 (two) times daily for 1 day. 09/27/18 09/28/18  Leeroy BockAnderson, Chelsey L, DO    Family History Family History  Problem Relation Age of Onset  . Hypertension Mother     Social History Social History   Tobacco Use  . Smoking status: Current Every Day Smoker  . Smokeless tobacco: Never Used  Substance Use Topics  . Alcohol use: No    Frequency: Never  . Drug use: Not on file  Allergies   Patient has no known allergies.   Review of Systems Review of Systems  Neurological: Positive for weakness.  All other systems reviewed and are negative.    Physical Exam Updated Vital Signs BP (!) 143/89 (BP Location: Right Arm)   Pulse (!) 102   Temp 99.8 F (37.7 C) (Oral)   Resp 20   SpO2 98%   Physical Exam Vitals signs and nursing note reviewed.  Constitutional:      Appearance: He is well-developed.     Comments: Elderly, non-toxic in appearance  HENT:     Head: Normocephalic and atraumatic.     Right Ear: Tympanic membrane and ear canal normal.     Left Ear: Tympanic membrane and ear canal normal.       Nose: Nose normal.  Eyes:     Conjunctiva/sclera: Conjunctivae normal.     Pupils: Pupils are equal, round, and reactive to light.  Neck:     Musculoskeletal: Normal range of motion.  Cardiovascular:     Rate and Rhythm: Normal rate and regular rhythm.     Heart sounds: Normal heart sounds.  Pulmonary:     Effort: Pulmonary effort is normal.     Breath sounds: Normal breath sounds.     Comments: Wet cough, lungs overall clear, no distress Abdominal:     General: Bowel sounds are normal.     Palpations: Abdomen is soft.  Musculoskeletal: Normal range of motion.  Skin:    General: Skin is warm and dry.  Neurological:     Mental Status: He is alert.     Comments: Awake, able to answer some simple questions but difficulty recalling details, speech intermittently stuttering; moving arms and legs without issue when prompted      ED Treatments / Results  Labs (all labs ordered are listed, but only abnormal results are displayed) Labs Reviewed  CBC WITH DIFFERENTIAL/PLATELET - Abnormal; Notable for the following components:      Result Value   Monocytes Absolute 1.3 (*)    All other components within normal limits  BASIC METABOLIC PANEL - Abnormal; Notable for the following components:   Creatinine, Ser 1.50 (*)    GFR calc non Af Amer 46 (*)    GFR calc Af Amer 54 (*)    All other components within normal limits  URINALYSIS, ROUTINE W REFLEX MICROSCOPIC - Abnormal; Notable for the following components:   Color, Urine AMBER (*)    APPearance HAZY (*)    Protein, ur 30 (*)    All other components within normal limits  CULTURE, BLOOD (ROUTINE X 2)  CULTURE, BLOOD (ROUTINE X 2)  URINE CULTURE  I-STAT CG4 LACTIC ACID, ED    EKG None  Radiology Dg Chest Portable 1 View  Result Date: 09/28/2018 CLINICAL DATA:  Altered mental status EXAM: PORTABLE CHEST 1 VIEW COMPARISON:  09/22/18 FINDINGS: Cardiac shadow is mildly prominent but accentuated by the portable technique.  Aortic calcifications are seen. The lungs are well aerated bilaterally without focal infiltrate or sizable effusion. No bony abnormality is noted. IMPRESSION: No acute abnormality seen. Electronically Signed   By: Alcide Clever M.D.   On: 09/28/2018 02:36    Procedures Procedures (including critical care time)  Medications Ordered in ED Medications - No data to display   Initial Impression / Assessment and Plan / ED Course  I have reviewed the triage vital signs and the nursing notes.  Pertinent labs & imaging results that were available during my  care of the patient were reviewed by me and considered in my medical decision making (see chart for details).  72 y.o. M here with generalized weakness.  Patient just released from the hospital yesterday morning after admission for same, found to have E. coli and Pseudomonas bacteremia.  Treated with cefepime on the hospital and discharged home with ciprofloxacin as per ID recommendations.  Patient's only complaint currently is cough but he has had this for about 2 weeks.  He denies any nausea, vomiting, chest pain, shortness of breath.  No abdominal pain.   We will plan for repeat labs, chest x-ray, blood and urine cultures.    Patient's labs are overall reassuring.  Normal lactate, normal white blood cell count.  UA without any signs of infection.  Chest x-ray is clear.  Patient was found to have a rectal temperature here of 101.56F.  Per chart review, he was afebrile for about 6 days prior to discharge.  Last blood cultures from 09/24/18 without growth.  He has cough for 2 weeks but no other complaints.  No body aches, chills, rigors, or other flu like symptoms. Given recurrent fever and recent bacteremia requiring ID involvement, will discuss with infectious disease for recommendations.  3:49 AM Case discussed with Dr. Luciana Axeomer-- reviewed labs/imaging from today's visit.  Uncertain etiology of fever today, possibly viral given recent hospitalization.     Would recommend extending ciprofloxacin an additional 5 days, follow-up on repeat blood cultures from today.    Plan of care discussed with patient, he feels comfortable with this.  He is eating/drinking here, continues answering questions and following commands.    Appears stable for discharge home.  Close follow-up with PCP encouraged.  He will return here for any new/acute changes.  Case discussed with attending physician, Dr. Wilkie AyeHorton, who evaluated patient and agrees with assessment and plan of care.  Final Clinical Impressions(s) / ED Diagnoses   Final diagnoses:  Generalized weakness  Fever, unspecified fever cause    ED Discharge Orders         Ordered    ciprofloxacin (CIPRO) 500 MG tablet  Every 12 hours     09/28/18 0540           Garlon HatchetSanders, Radie Berges M, PA-C 09/28/18 [id number]    Shon BatonHorton, Courtney F, MD 09/28/18 [phone]

## 2018-09-28 NOTE — ED Notes (Signed)
This RN spoke with pt's family member, Lesly DukesRose Mary and is coordinating pt's discharge. Will call [facility] once pt's family has returned home.

## 2018-09-28 NOTE — ED Notes (Signed)
Changed pt into clothes. Attempted to call pt's family for discharge. Family unavailable. Will continue to try to contact.

## 2018-09-28 NOTE — ED Notes (Signed)
[facility] at bedside to transport pt home. RN verified that family is at residence where pt will be transported.

## 2018-09-28 NOTE — ED Notes (Signed)
Attempted to call pt family members about DC with no answer. Voicemail left.

## 2018-09-29 LAB — CULTURE, BLOOD (ROUTINE X 2)
Culture: NO GROWTH
Culture: NO GROWTH
SPECIAL REQUESTS: ADEQUATE
Special Requests: ADEQUATE

## 2018-09-29 LAB — URINE CULTURE: Culture: NO GROWTH

## 2018-09-30 DIAGNOSIS — Z7982 Long term (current) use of aspirin: Secondary | ICD-10-CM | POA: Diagnosis not present

## 2018-09-30 DIAGNOSIS — I10 Essential (primary) hypertension: Secondary | ICD-10-CM | POA: Diagnosis not present

## 2018-09-30 DIAGNOSIS — Z9181 History of falling: Secondary | ICD-10-CM | POA: Diagnosis not present

## 2018-09-30 DIAGNOSIS — G309 Alzheimer's disease, unspecified: Secondary | ICD-10-CM | POA: Diagnosis not present

## 2018-09-30 DIAGNOSIS — Z87440 Personal history of urinary (tract) infections: Secondary | ICD-10-CM | POA: Diagnosis not present

## 2018-09-30 DIAGNOSIS — F0281 Dementia in other diseases classified elsewhere with behavioral disturbance: Secondary | ICD-10-CM | POA: Diagnosis not present

## 2018-09-30 DIAGNOSIS — I252 Old myocardial infarction: Secondary | ICD-10-CM | POA: Diagnosis not present

## 2018-09-30 DIAGNOSIS — F17210 Nicotine dependence, cigarettes, uncomplicated: Secondary | ICD-10-CM | POA: Diagnosis not present

## 2018-09-30 DIAGNOSIS — E538 Deficiency of other specified B group vitamins: Secondary | ICD-10-CM | POA: Diagnosis not present

## 2018-09-30 DIAGNOSIS — Z8673 Personal history of transient ischemic attack (TIA), and cerebral infarction without residual deficits: Secondary | ICD-10-CM | POA: Diagnosis not present

## 2018-10-01 ENCOUNTER — Telehealth: Payer: Self-pay | Admitting: General Practice

## 2018-10-01 DIAGNOSIS — I252 Old myocardial infarction: Secondary | ICD-10-CM | POA: Diagnosis not present

## 2018-10-01 DIAGNOSIS — I10 Essential (primary) hypertension: Secondary | ICD-10-CM | POA: Diagnosis not present

## 2018-10-01 DIAGNOSIS — E538 Deficiency of other specified B group vitamins: Secondary | ICD-10-CM | POA: Diagnosis not present

## 2018-10-01 DIAGNOSIS — G309 Alzheimer's disease, unspecified: Secondary | ICD-10-CM | POA: Diagnosis not present

## 2018-10-01 DIAGNOSIS — F0281 Dementia in other diseases classified elsewhere with behavioral disturbance: Secondary | ICD-10-CM | POA: Diagnosis not present

## 2018-10-01 DIAGNOSIS — F17210 Nicotine dependence, cigarettes, uncomplicated: Secondary | ICD-10-CM | POA: Diagnosis not present

## 2018-10-01 NOTE — Telephone Encounter (Signed)
Opened in error, pt was not seen in our office

## 2018-10-03 LAB — CULTURE, BLOOD (ROUTINE X 2)
Culture: NO GROWTH
Culture: NO GROWTH
Special Requests: ADEQUATE

## 2018-10-11 DIAGNOSIS — I10 Essential (primary) hypertension: Secondary | ICD-10-CM | POA: Diagnosis not present

## 2018-10-11 DIAGNOSIS — I252 Old myocardial infarction: Secondary | ICD-10-CM | POA: Diagnosis not present

## 2018-10-11 DIAGNOSIS — F17210 Nicotine dependence, cigarettes, uncomplicated: Secondary | ICD-10-CM | POA: Diagnosis not present

## 2018-10-11 DIAGNOSIS — E538 Deficiency of other specified B group vitamins: Secondary | ICD-10-CM | POA: Diagnosis not present

## 2018-10-11 DIAGNOSIS — G309 Alzheimer's disease, unspecified: Secondary | ICD-10-CM | POA: Diagnosis not present

## 2018-10-11 DIAGNOSIS — F0281 Dementia in other diseases classified elsewhere with behavioral disturbance: Secondary | ICD-10-CM | POA: Diagnosis not present

## 2018-10-12 ENCOUNTER — Ambulatory Visit: Payer: Medicare Other | Admitting: Family Medicine

## 2018-11-10 ENCOUNTER — Ambulatory Visit: Payer: Medicare Other | Admitting: Family Medicine

## 2018-11-16 ENCOUNTER — Ambulatory Visit: Payer: Medicare Other | Admitting: Family Medicine

## 2018-11-18 ENCOUNTER — Emergency Department (HOSPITAL_COMMUNITY): Payer: Medicare Other

## 2018-11-18 ENCOUNTER — Ambulatory Visit: Payer: Medicare Other | Admitting: Family Medicine

## 2018-11-18 ENCOUNTER — Encounter (HOSPITAL_COMMUNITY): Payer: Self-pay

## 2018-11-18 ENCOUNTER — Emergency Department (HOSPITAL_COMMUNITY)
Admission: EM | Admit: 2018-11-18 | Discharge: 2018-11-18 | Disposition: A | Discharge status: Home / Self Care | Payer: Medicare Other | Attending: Emergency Medicine | Admitting: Emergency Medicine

## 2018-11-18 DIAGNOSIS — I10 Essential (primary) hypertension: Secondary | ICD-10-CM | POA: Insufficient documentation

## 2018-11-18 DIAGNOSIS — R52 Pain, unspecified: Secondary | ICD-10-CM | POA: Diagnosis not present

## 2018-11-18 DIAGNOSIS — F0280 Dementia in other diseases classified elsewhere without behavioral disturbance: Secondary | ICD-10-CM | POA: Diagnosis not present

## 2018-11-18 DIAGNOSIS — F17210 Nicotine dependence, cigarettes, uncomplicated: Secondary | ICD-10-CM | POA: Diagnosis not present

## 2018-11-18 DIAGNOSIS — R51 Headache: Secondary | ICD-10-CM | POA: Diagnosis not present

## 2018-11-18 DIAGNOSIS — S3992XA Unspecified injury of lower back, initial encounter: Secondary | ICD-10-CM | POA: Diagnosis present

## 2018-11-18 DIAGNOSIS — Y999 Unspecified external cause status: Secondary | ICD-10-CM | POA: Insufficient documentation

## 2018-11-18 DIAGNOSIS — R079 Chest pain, unspecified: Secondary | ICD-10-CM | POA: Diagnosis not present

## 2018-11-18 DIAGNOSIS — Y9301 Activity, walking, marching and hiking: Secondary | ICD-10-CM | POA: Diagnosis not present

## 2018-11-18 DIAGNOSIS — S299XXA Unspecified injury of thorax, initial encounter: Secondary | ICD-10-CM | POA: Diagnosis not present

## 2018-11-18 DIAGNOSIS — S32020A Wedge compression fracture of second lumbar vertebra, initial encounter for closed fracture: Secondary | ICD-10-CM | POA: Diagnosis not present

## 2018-11-18 DIAGNOSIS — Y92008 Other place in unspecified non-institutional (private) residence as the place of occurrence of the external cause: Secondary | ICD-10-CM | POA: Diagnosis not present

## 2018-11-18 DIAGNOSIS — S32010A Wedge compression fracture of first lumbar vertebra, initial encounter for closed fracture: Secondary | ICD-10-CM | POA: Diagnosis not present

## 2018-11-18 DIAGNOSIS — G309 Alzheimer's disease, unspecified: Secondary | ICD-10-CM | POA: Insufficient documentation

## 2018-11-18 DIAGNOSIS — M5489 Other dorsalgia: Secondary | ICD-10-CM | POA: Diagnosis not present

## 2018-11-18 DIAGNOSIS — Z7982 Long term (current) use of aspirin: Secondary | ICD-10-CM | POA: Diagnosis not present

## 2018-11-18 DIAGNOSIS — Z79899 Other long term (current) drug therapy: Secondary | ICD-10-CM | POA: Diagnosis not present

## 2018-11-18 DIAGNOSIS — S32018A Other fracture of first lumbar vertebra, initial encounter for closed fracture: Secondary | ICD-10-CM | POA: Diagnosis not present

## 2018-11-18 DIAGNOSIS — R9431 Abnormal electrocardiogram [ECG] [EKG]: Secondary | ICD-10-CM | POA: Diagnosis not present

## 2018-11-18 DIAGNOSIS — W01190A Fall on same level from slipping, tripping and stumbling with subsequent striking against furniture, initial encounter: Secondary | ICD-10-CM | POA: Insufficient documentation

## 2018-11-18 DIAGNOSIS — S0990XA Unspecified injury of head, initial encounter: Secondary | ICD-10-CM | POA: Diagnosis not present

## 2018-11-18 DIAGNOSIS — W19XXXA Unspecified fall, initial encounter: Secondary | ICD-10-CM

## 2018-11-18 LAB — CBC WITH DIFFERENTIAL/PLATELET
Abs Immature Granulocytes: 0.04 10*3/uL (ref 0.00–0.07)
Basophils Absolute: 0 10*3/uL (ref 0.0–0.1)
Basophils Relative: 0 %
Eosinophils Absolute: 0 10*3/uL (ref 0.0–0.5)
Eosinophils Relative: 0 %
HCT: 48.8 % (ref 39.0–52.0)
Hemoglobin: 15.8 g/dL (ref 13.0–17.0)
IMMATURE GRANULOCYTES: 0 %
LYMPHS PCT: 9 %
Lymphs Abs: 0.9 10*3/uL (ref 0.7–4.0)
MCH: 29.3 pg (ref 26.0–34.0)
MCHC: 32.4 g/dL (ref 30.0–36.0)
MCV: 90.5 fL (ref 80.0–100.0)
Monocytes Absolute: 0.7 10*3/uL (ref 0.1–1.0)
Monocytes Relative: 7 %
Neutro Abs: 8.8 10*3/uL — ABNORMAL HIGH (ref 1.7–7.7)
Neutrophils Relative %: 84 %
Platelets: 166 10*3/uL (ref 150–400)
RBC: 5.39 MIL/uL (ref 4.22–5.81)
RDW: 14.5 % (ref 11.5–15.5)
WBC: 10.6 10*3/uL — ABNORMAL HIGH (ref 4.0–10.5)
nRBC: 0 % (ref 0.0–0.2)

## 2018-11-18 LAB — COMPREHENSIVE METABOLIC PANEL
ALT: 26 U/L (ref 0–44)
AST: 25 U/L (ref 15–41)
Albumin: 4.2 g/dL (ref 3.5–5.0)
Alkaline Phosphatase: 76 U/L (ref 38–126)
Anion gap: 10 (ref 5–15)
BUN: 12 mg/dL (ref 8–23)
CO2: 23 mmol/L (ref 22–32)
CREATININE: 1.21 mg/dL (ref 0.61–1.24)
Calcium: 9.4 mg/dL (ref 8.9–10.3)
Chloride: 106 mmol/L (ref 98–111)
GFR calc Af Amer: >60 mL/min (ref >60)
GFR calc non Af Amer: 60 mL/min — ABNORMAL LOW (ref >60)
Glucose, Bld: 114 mg/dL — ABNORMAL HIGH (ref 70–99)
Potassium: 4.6 mmol/L (ref 3.5–5.1)
SODIUM: 139 mmol/L (ref 135–145)
Total Bilirubin: 0.7 mg/dL (ref 0.3–1.2)
Total Protein: 7.9 g/dL (ref 6.5–8.1)

## 2018-11-18 LAB — URINALYSIS, ROUTINE W REFLEX MICROSCOPIC
Bilirubin Urine: NEGATIVE
GLUCOSE, UA: NEGATIVE mg/dL
Hgb urine dipstick: NEGATIVE
Ketones, ur: NEGATIVE mg/dL
LEUKOCYTE UA: NEGATIVE
Nitrite: NEGATIVE
Protein, ur: NEGATIVE mg/dL
Specific Gravity, Urine: 1.013 (ref 1.005–1.030)
pH: 8 (ref 5.0–8.0)

## 2018-11-18 LAB — PROTIME-INR
INR: 1.04
Prothrombin Time: 13.5 seconds (ref 11.4–15.2)

## 2018-11-18 LAB — CBG MONITORING, ED: Glucose-Capillary: 111 mg/dL — ABNORMAL HIGH (ref 70–99)

## 2018-11-18 MED ORDER — SODIUM CHLORIDE 0.9 % IV SOLN: INTRAVENOUS | Status: DC

## 2018-11-18 MED ORDER — SODIUM CHLORIDE 0.9 % IV BOLUS
1000.0000 mL | Freq: Once | INTRAVENOUS | Status: AC
  Administered 2018-11-18: 1000 mL via INTRAVENOUS

## 2018-11-18 MED ORDER — MORPHINE SULFATE (PF) 4 MG/ML IV SOLN
4.0000 mg | Freq: Once | INTRAVENOUS | Status: AC
  Administered 2018-11-18: 4 mg via INTRAVENOUS
  Filled 2018-11-18: qty 1

## 2018-11-18 MED ORDER — ONDANSETRON HCL 4 MG/2ML IJ SOLN
4.0000 mg | Freq: Once | INTRAMUSCULAR | Status: AC
  Administered 2018-11-18: 4 mg via INTRAVENOUS
  Filled 2018-11-18: qty 2

## 2018-11-18 MED ORDER — HYDROCODONE-ACETAMINOPHEN 5-325 MG PO TABS: 1.0000 | ORAL_TABLET | ORAL | 0 refills | Status: DC | PRN

## 2018-11-18 NOTE — ED Notes (Signed)
Patient transported to CT 

## 2018-11-18 NOTE — Progress Notes (Signed)
Orthopedic Tech Progress Note Patient Details:  Amandeep Jonak 03-Sep-1946 [id number] Called in order for TLSO Patient ID: Crishawn Segel, male   DOB: [birth year], 73 y.o.   MRN: [id number]   Donald Pore 11/18/2018, 12:53 PM

## 2018-11-18 NOTE — ED Notes (Signed)
Got patient on the monitor did ekg shown to Dr Particia Nearing help get patient undress patient is resting with call bell in reach

## 2018-11-18 NOTE — ED Notes (Signed)
Family bedside. Discharge instructions given to family and they verbalized understanding.

## 2018-11-18 NOTE — ED Notes (Signed)
Pt's ex-wife called back [name]); stated she did not have transportation to pick up pt and requested an ambulance to take him home; consulted Case management for recommendations; This RN informed pt's family member that [facility] can take the pt home but he must have keys to his home in hand; pt's family member then stated that she has the home keys in her possession and was not home at the moment and will be home in "5 minutes" ; re-informed her that pt must have keys in hand. Placed her on hold for 3 seconds and family member hung up.

## 2018-11-18 NOTE — ED Notes (Signed)
Patient transported to X-ray 

## 2018-11-18 NOTE — ED Notes (Signed)
This RN attempted to gain IV access x1 unsuccessfully. Patient is difficult stick. IV team consult placed.

## 2018-11-18 NOTE — ED Triage Notes (Signed)
Pt arrives by [facility]; pt had an unwitnessed fall when getting out of bed this morning and hit head on side table with no obvious injuries per EMS. No reported seizure like activity per pt's wife. Pt stated he felt dizziness this morning when he fell and is experiencing back pain. Hx of hypertension and is not on any blood thinners. Pt had multiple multiple episodes of urinary incontinence post fall. Pt a&o x4. VSS at this time.  EMS vitals:  CBG 121 BP 164/98 HR 90 RR 24 O2 sat 97% on RA

## 2018-11-18 NOTE — ED Notes (Signed)
Austin Weaver [phone] pt stepdaughter////[phone] Austin Weaver

## 2018-11-18 NOTE — ED Notes (Signed)
Ortho tech bedside applying TLSO brace

## 2018-11-18 NOTE — ED Notes (Signed)
Pt able to ambulate in hallway with minimal assistance and a steady gait. Pt states that he has a walker at home and will be able to use it to ambulate around the house.

## 2018-11-18 NOTE — ED Notes (Signed)
ED Provider at bedside. 

## 2018-11-18 NOTE — ED Notes (Signed)
Ortho tech paged- they are ordering from [employer] now and then will come (20 mins)

## 2018-11-18 NOTE — ED Notes (Signed)
Patient verbalizes understanding of discharge instructions. Opportunity for questioning and answers were provided. Armband removed by staff, pt discharged from ED.  
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
## 2018-11-18 NOTE — ED Notes (Signed)
This RN spoke with pt's stepdaughter regarding transport home; she stated that she would call her mother and have her call me back.

## 2018-11-18 NOTE — ED Notes (Signed)
This RN called pt's family again; asked if anyone could pick him up since he does not have keys in hand to be able to go home with Sharin Mons; [name] (ex-wife) stated "they" are on their way to come pick up the pt.

## 2018-11-18 NOTE — Discharge Instructions (Signed)
Use your walker when you walk.

## 2018-11-19 LAB — URINE CULTURE: Culture: NO GROWTH

## 2018-11-30 ENCOUNTER — Telehealth: Payer: Self-pay | Admitting: Family Medicine

## 2018-11-30 ENCOUNTER — Other Ambulatory Visit (HOSPITAL_COMMUNITY): Payer: Self-pay | Admitting: Student in an Organized Health Care Education/Training Program

## 2018-11-30 NOTE — Telephone Encounter (Signed)
This patient has cancelled or not shown for 3 appointments with me.   I have availability for a new patient visit tomorrow 12/01/2018 in the AM. Would recommend making appointment.    May have considered refill if patient had not missed/rescheduled so many other other appointments.   If unable to make tomorrow, OK for acute visit Thursday -- specifically to refill medication. Will still need to keep appointment in March

## 2018-11-30 NOTE — Telephone Encounter (Signed)
Ladona Ridgel at [facility] pharmacy left v/m that Dr [name] cannot refill amlodipine.Please advise.

## 2018-11-30 NOTE — Telephone Encounter (Signed)
Pt has a NP appt on 3.18.20 w/Dr Selena Batten. Pt need refill on his BP medication    Amlodipine 10mg    Sent to [facility]/[phone]  Pt received this mediation when he was in the hospital. Please call pt to advise

## 2018-11-30 NOTE — Telephone Encounter (Signed)
Spoke with patient and Austin Weaver and appointment made for tomorrow 12/01/2018.

## 2018-11-30 NOTE — Telephone Encounter (Signed)
I explained to pt that he would need to ck with the provider he has been seeing to get refill on amlodipine. Pt gave phone to [name] and she said pt was seeing Dr Vear Clock who is no longer in practice and then he saw another doctor in [place] but that doctor has left the area. Per med list pt last received refill from Dr Jamelle Rushing with [facility] phone [phone]. [facility] left refill request earlier this morning. [name] said to ask [facility] to ck on refill with Dr [name] and give her a call and let her know. I spoke with Ladona Ridgel at [facility] and explained that pt has never seen Dr Selena Batten and has new pt appt with Dr Selena Batten on 12/22/18. Ladona Ridgel will ck with Dr Ewell Poe office and if no success Anastasiya said to call her back. Ladona Ridgel voiced understanding. FYI to Anastasiya CMA.

## 2018-11-30 NOTE — Telephone Encounter (Signed)
Please review. Can we address?

## 2018-11-30 NOTE — Telephone Encounter (Signed)
Called patient, not able to leave a message. Kept ringing. Will try again later.

## 2018-12-01 ENCOUNTER — Ambulatory Visit (INDEPENDENT_AMBULATORY_CARE_PROVIDER_SITE_OTHER): Payer: Medicare Other | Admitting: Family Medicine

## 2018-12-01 ENCOUNTER — Encounter: Payer: Self-pay | Admitting: Family Medicine

## 2018-12-01 VITALS — BP 144/82 | HR 82 | Temp 98.1°F | Resp 14 | Ht 64.5 in | Wt 179.0 lb

## 2018-12-01 DIAGNOSIS — I639 Cerebral infarction, unspecified: Secondary | ICD-10-CM

## 2018-12-01 DIAGNOSIS — Z5941 Food insecurity: Secondary | ICD-10-CM | POA: Insufficient documentation

## 2018-12-01 DIAGNOSIS — I10 Essential (primary) hypertension: Secondary | ICD-10-CM

## 2018-12-01 DIAGNOSIS — T7840XS Allergy, unspecified, sequela: Secondary | ICD-10-CM

## 2018-12-01 DIAGNOSIS — E538 Deficiency of other specified B group vitamins: Secondary | ICD-10-CM | POA: Diagnosis not present

## 2018-12-01 DIAGNOSIS — F4329 Adjustment disorder with other symptoms: Secondary | ICD-10-CM | POA: Insufficient documentation

## 2018-12-01 DIAGNOSIS — G308 Other Alzheimer's disease: Secondary | ICD-10-CM

## 2018-12-01 DIAGNOSIS — Z594 Lack of adequate food and safe drinking water: Secondary | ICD-10-CM | POA: Diagnosis not present

## 2018-12-01 DIAGNOSIS — F4381 Prolonged grief disorder: Secondary | ICD-10-CM | POA: Insufficient documentation

## 2018-12-01 DIAGNOSIS — F4321 Adjustment disorder with depressed mood: Secondary | ICD-10-CM | POA: Diagnosis not present

## 2018-12-01 DIAGNOSIS — R531 Weakness: Secondary | ICD-10-CM

## 2018-12-01 DIAGNOSIS — F0281 Dementia in other diseases classified elsewhere with behavioral disturbance: Secondary | ICD-10-CM

## 2018-12-01 DIAGNOSIS — F02818 Dementia in other diseases classified elsewhere, unspecified severity, with other behavioral disturbance: Secondary | ICD-10-CM

## 2018-12-01 DIAGNOSIS — T7840XA Allergy, unspecified, initial encounter: Secondary | ICD-10-CM | POA: Insufficient documentation

## 2018-12-01 MED ORDER — CYANOCOBALAMIN 500 MCG PO TABS: 500.0000 ug | ORAL_TABLET | Freq: Every day | ORAL | 1 refills | Status: DC

## 2018-12-01 MED ORDER — ASPIRIN 325 MG PO TABS: 325.0000 mg | ORAL_TABLET | Freq: Every day | ORAL | 1 refills | Status: DC

## 2018-12-01 MED ORDER — AMLODIPINE BESYLATE 10 MG PO TABS: 10.0000 mg | ORAL_TABLET | Freq: Every day | ORAL | 1 refills | Status: DC

## 2018-12-01 MED ORDER — ATORVASTATIN CALCIUM 80 MG PO TABS: 80.0000 mg | ORAL_TABLET | Freq: Every day | ORAL | 1 refills | Status: DC

## 2018-12-01 NOTE — Assessment & Plan Note (Signed)
[employer] provided today.

## 2018-12-01 NOTE — Assessment & Plan Note (Signed)
BP mildly elevated, but in setting of recent fall am hesitant to add additional medication. Will wait and reassess at follow-up appointment

## 2018-12-01 NOTE — Assessment & Plan Note (Signed)
Approaching 1 year anniversary of son's death. Does not seem as though pt has fully processed his emotions related to his son's death. Hand out for behavioral health provided and counseling encouraged.

## 2018-12-01 NOTE — Progress Notes (Signed)
Subjective:     Austin Weaver is a 73 y.o. male presenting for Establish Care (previous PCP Dr. Janeece Riggers passed away now) and Allergies (would like something for allergies)     HPI   #leg issue - was on a medication for his legs after his stroke in February - forgot the name of the medication - endorses some muscle weakness and tightness  #Stroke - left leg weakness - slow ambulation - left hand weakness - is still able to dress himself and do basic ADLs  #Tobacco use - had quit for about 1 year - son passed away last 18-Mar-2019and he started smoking again - 1/2 ppd - not ready to quit  #prolonged grief - son passed away last year - does not talk with wife about the death or loss  #need for home therapy - had a few home health visits - but it stopped b/c not having a primary doctor - last fall on Feb 13 - got up and felt dizzy like the room was spinning around - hit his head on the table and broke a L1 vertebre  #Vit B12 deficiency - has not been doing this at home - does not want injections  #allergies - runny nose - sneezing - no cough - itchy eyes - symptoms x 1 month - occasional symptoms  Review of Systems See HPI All over ROS negative apart from discussion above  09/28/2018: ER - hx of TIA, HTN, prior CVA who went to the ER with weakness. Fever in ER, otherwise reassuring work-up. Given longer course of cipro 12/17-12/23/2019: Admission - bacteremia - e coli and pseudomonas. Declined SNF placement.  -- B12 weekly injections, until normal followed by monthly -- HTN - increased amlodipine in the hospital   PMH, PSH, FMHx and Soc Hx reviewed and updated in EPIC   Social History   Tobacco Use  Smoking Status Current Every Day Smoker  . Packs/day: 0.50  . Years: 20.00  . Pack years: 10.00  . Types: Cigarettes  Smokeless Tobacco Never Used        Objective:    BP Readings from Last 3 Encounters:  12/01/18 (!) 144/82  11/18/18  (!) 146/90  09/28/18 126/73   Wt Readings from Last 3 Encounters:  12/01/18 179 lb (81.2 kg)  11/18/18 120 lb (54.4 kg)  09/21/18 122 lb (55.3 kg)    BP (!) 144/82   Pulse 82   Temp 98.1 F (36.7 C)   Resp 14   Ht 5' 4.5" (1.638 m)   Wt 179 lb (81.2 kg)   SpO2 98%   BMI 30.25 kg/m    Physical Exam Constitutional:      General: He is not in acute distress.    Appearance: He is well-developed. He is not ill-appearing.  HENT:     Head: Normocephalic and atraumatic.     Right Ear: Tympanic membrane and ear canal normal.     Left Ear: Tympanic membrane and ear canal normal.     Nose: Mucosal edema and rhinorrhea present.     Right Sinus: No maxillary sinus tenderness or frontal sinus tenderness.     Left Sinus: No maxillary sinus tenderness or frontal sinus tenderness.     Mouth/Throat:     Pharynx: Uvula midline. Posterior oropharyngeal erythema present. No oropharyngeal exudate.     Tonsils: Swelling: 0 on the right. 0 on the left.  Neck:     Musculoskeletal: Neck supple.  Cardiovascular:  Rate and Rhythm: Normal rate and regular rhythm.     Heart sounds: No murmur.  Pulmonary:     Effort: Pulmonary effort is normal. No respiratory distress.     Breath sounds: Normal breath sounds.  Musculoskeletal:     Comments: Left hand in brace and held at side.Though able to have good ROM on the left and good strength 4-5/5 on the left side  Lymphadenopathy:     Cervical: No cervical adenopathy.  Skin:    General: Skin is warm and dry.     Capillary Refill: Capillary refill takes less than 2 seconds.  Neurological:     Mental Status: He is alert. Mental status is at baseline.     Gait: Gait abnormal (walking with some weakness of the left leg).           Assessment & Plan:   Problem List Items Addressed This Visit      Cardiovascular and Mediastinum   CVA (cerebral vascular accident) [facility]), old - Primary    Discussed bleeding risk of ASA vs stroke prevention. The  family felt that he has not had severe falls and would like to continue ASA      Relevant Medications   amLODipine (NORVASC) 10 MG tablet   atorvastatin (LIPITOR) 80 MG tablet   aspirin 325 MG tablet   Other Relevant Orders   Ambulatory referral to Home Health   Essential hypertension    BP mildly elevated, but in setting of recent fall am hesitant to add additional medication. Will wait and reassess at follow-up appointment      Relevant Medications   amLODipine (NORVASC) 10 MG tablet   atorvastatin (LIPITOR) 80 MG tablet   aspirin 325 MG tablet     Nervous and Auditory   Alzheimer's dementia with behavioral disturbance (HCC)    Did not directly address in appointment today, but relevant to need for home health PT.         Other   Weakness   Relevant Orders   Ambulatory referral to Home Health   Vitamin B12 deficiency    Declined injection treatment. Will do oral replacement. Recheck at next appointment      Relevant Medications   vitamin B-12 (CYANOCOBALAMIN) 500 MCG tablet   Allergies    Trial of OTC medication      Food insecurity    [employer] provided today.       Grief reaction with prolonged bereavement    Approaching 1 year anniversary of son's death. Does not seem as though pt has fully processed his emotions related to his son's death. Hand out for behavioral health provided and counseling encouraged.           Return in about 2 months (around 01/30/2019).  [name], MD

## 2018-12-01 NOTE — Assessment & Plan Note (Signed)
Declined injection treatment. Will do oral replacement. Recheck at next appointment

## 2018-12-01 NOTE — Assessment & Plan Note (Signed)
Discussed bleeding risk of ASA vs stroke prevention. The family felt that he has not had severe falls and would like to continue ASA

## 2018-12-01 NOTE — Patient Instructions (Addendum)
#  allergies - Try Claritin or Zyrtec - 24 hour over the counter medication - store brand is OK - you can get these at the pharmacy   # Home Health  - Put in the order for home health - someone should call you in a few days  #Tobacco - work on trying to quit smoking - let me know when you are ready  #Stroke - continue to take the Aspirin  - Continue to Atorvastatin  #Hypertension - Continue the Amlodipine  #Grief - consider going to therapy to talk about how you are feeling - There is a handout  #Food/Financial support - hand out for food resources

## 2018-12-01 NOTE — Assessment & Plan Note (Signed)
Trial of OTC medication

## 2018-12-01 NOTE — Assessment & Plan Note (Signed)
Did not directly address in appointment today, but relevant to need for home health PT.

## 2018-12-06 ENCOUNTER — Telehealth: Payer: Self-pay | Admitting: Family Medicine

## 2018-12-06 DIAGNOSIS — I69354 Hemiplegia and hemiparesis following cerebral infarction affecting left non-dominant side: Secondary | ICD-10-CM | POA: Diagnosis not present

## 2018-12-06 DIAGNOSIS — S32020D Wedge compression fracture of second lumbar vertebra, subsequent encounter for fracture with routine healing: Secondary | ICD-10-CM | POA: Diagnosis not present

## 2018-12-06 DIAGNOSIS — F17210 Nicotine dependence, cigarettes, uncomplicated: Secondary | ICD-10-CM | POA: Diagnosis not present

## 2018-12-06 DIAGNOSIS — G309 Alzheimer's disease, unspecified: Secondary | ICD-10-CM | POA: Diagnosis not present

## 2018-12-06 DIAGNOSIS — Z7982 Long term (current) use of aspirin: Secondary | ICD-10-CM | POA: Diagnosis not present

## 2018-12-06 DIAGNOSIS — Z79891 Long term (current) use of opiate analgesic: Secondary | ICD-10-CM | POA: Diagnosis not present

## 2018-12-06 DIAGNOSIS — S32010D Wedge compression fracture of first lumbar vertebra, subsequent encounter for fracture with routine healing: Secondary | ICD-10-CM | POA: Diagnosis not present

## 2018-12-06 DIAGNOSIS — W19XXXD Unspecified fall, subsequent encounter: Secondary | ICD-10-CM | POA: Diagnosis not present

## 2018-12-06 DIAGNOSIS — F0281 Dementia in other diseases classified elsewhere with behavioral disturbance: Secondary | ICD-10-CM | POA: Diagnosis not present

## 2018-12-06 NOTE — Telephone Encounter (Signed)
Need PT orders   2x a week for 3 wks  Need orders for OT and Social Work.

## 2018-12-06 NOTE — Telephone Encounter (Signed)
OK for the orders. Agree with PT, OT. And social work.

## 2018-12-06 NOTE — Telephone Encounter (Signed)
Stacey advised. 

## 2018-12-08 DIAGNOSIS — G309 Alzheimer's disease, unspecified: Secondary | ICD-10-CM | POA: Diagnosis not present

## 2018-12-08 DIAGNOSIS — S32010D Wedge compression fracture of first lumbar vertebra, subsequent encounter for fracture with routine healing: Secondary | ICD-10-CM | POA: Diagnosis not present

## 2018-12-08 DIAGNOSIS — W19XXXD Unspecified fall, subsequent encounter: Secondary | ICD-10-CM | POA: Diagnosis not present

## 2018-12-08 DIAGNOSIS — S32020D Wedge compression fracture of second lumbar vertebra, subsequent encounter for fracture with routine healing: Secondary | ICD-10-CM | POA: Diagnosis not present

## 2018-12-08 DIAGNOSIS — F0281 Dementia in other diseases classified elsewhere with behavioral disturbance: Secondary | ICD-10-CM | POA: Diagnosis not present

## 2018-12-08 DIAGNOSIS — I69354 Hemiplegia and hemiparesis following cerebral infarction affecting left non-dominant side: Secondary | ICD-10-CM | POA: Diagnosis not present

## 2018-12-09 ENCOUNTER — Telehealth: Payer: Self-pay

## 2018-12-09 NOTE — Telephone Encounter (Signed)
Ok for [facility] OT 1x week for 1 week and 2x a week for 1 week.   [name]

## 2018-12-09 NOTE — Telephone Encounter (Signed)
Austin Weaver OT with [facility] left v/m requesting verbal orders for [facility] OT 1 x a wk for 1 wk and 2 x a wk for 1 wk.Please advise.

## 2018-12-09 NOTE — Telephone Encounter (Signed)
Left detailed message for Austin Weaver with below orders authorization

## 2018-12-10 NOTE — Telephone Encounter (Signed)
[name] aware

## 2018-12-13 DIAGNOSIS — S32020D Wedge compression fracture of second lumbar vertebra, subsequent encounter for fracture with routine healing: Secondary | ICD-10-CM | POA: Diagnosis not present

## 2018-12-13 DIAGNOSIS — S32010D Wedge compression fracture of first lumbar vertebra, subsequent encounter for fracture with routine healing: Secondary | ICD-10-CM | POA: Diagnosis not present

## 2018-12-13 DIAGNOSIS — I69354 Hemiplegia and hemiparesis following cerebral infarction affecting left non-dominant side: Secondary | ICD-10-CM | POA: Diagnosis not present

## 2018-12-13 DIAGNOSIS — F0281 Dementia in other diseases classified elsewhere with behavioral disturbance: Secondary | ICD-10-CM | POA: Diagnosis not present

## 2018-12-13 DIAGNOSIS — W19XXXD Unspecified fall, subsequent encounter: Secondary | ICD-10-CM | POA: Diagnosis not present

## 2018-12-13 DIAGNOSIS — G309 Alzheimer's disease, unspecified: Secondary | ICD-10-CM | POA: Diagnosis not present

## 2018-12-15 DIAGNOSIS — G309 Alzheimer's disease, unspecified: Secondary | ICD-10-CM | POA: Diagnosis not present

## 2018-12-15 DIAGNOSIS — F0281 Dementia in other diseases classified elsewhere with behavioral disturbance: Secondary | ICD-10-CM | POA: Diagnosis not present

## 2018-12-15 DIAGNOSIS — S32020D Wedge compression fracture of second lumbar vertebra, subsequent encounter for fracture with routine healing: Secondary | ICD-10-CM | POA: Diagnosis not present

## 2018-12-15 DIAGNOSIS — W19XXXD Unspecified fall, subsequent encounter: Secondary | ICD-10-CM | POA: Diagnosis not present

## 2018-12-15 DIAGNOSIS — I69354 Hemiplegia and hemiparesis following cerebral infarction affecting left non-dominant side: Secondary | ICD-10-CM | POA: Diagnosis not present

## 2018-12-15 DIAGNOSIS — S32010D Wedge compression fracture of first lumbar vertebra, subsequent encounter for fracture with routine healing: Secondary | ICD-10-CM | POA: Diagnosis not present

## 2018-12-17 DIAGNOSIS — W19XXXD Unspecified fall, subsequent encounter: Secondary | ICD-10-CM | POA: Diagnosis not present

## 2018-12-17 DIAGNOSIS — G309 Alzheimer's disease, unspecified: Secondary | ICD-10-CM | POA: Diagnosis not present

## 2018-12-17 DIAGNOSIS — S32020D Wedge compression fracture of second lumbar vertebra, subsequent encounter for fracture with routine healing: Secondary | ICD-10-CM | POA: Diagnosis not present

## 2018-12-17 DIAGNOSIS — F0281 Dementia in other diseases classified elsewhere with behavioral disturbance: Secondary | ICD-10-CM | POA: Diagnosis not present

## 2018-12-17 DIAGNOSIS — S32010D Wedge compression fracture of first lumbar vertebra, subsequent encounter for fracture with routine healing: Secondary | ICD-10-CM | POA: Diagnosis not present

## 2018-12-17 DIAGNOSIS — I69354 Hemiplegia and hemiparesis following cerebral infarction affecting left non-dominant side: Secondary | ICD-10-CM | POA: Diagnosis not present

## 2018-12-20 ENCOUNTER — Telehealth: Payer: Self-pay | Admitting: *Deleted

## 2018-12-20 DIAGNOSIS — S32010D Wedge compression fracture of first lumbar vertebra, subsequent encounter for fracture with routine healing: Secondary | ICD-10-CM | POA: Diagnosis not present

## 2018-12-20 DIAGNOSIS — W19XXXD Unspecified fall, subsequent encounter: Secondary | ICD-10-CM | POA: Diagnosis not present

## 2018-12-20 DIAGNOSIS — I69354 Hemiplegia and hemiparesis following cerebral infarction affecting left non-dominant side: Secondary | ICD-10-CM | POA: Diagnosis not present

## 2018-12-20 DIAGNOSIS — F0281 Dementia in other diseases classified elsewhere with behavioral disturbance: Secondary | ICD-10-CM | POA: Diagnosis not present

## 2018-12-20 DIAGNOSIS — G309 Alzheimer's disease, unspecified: Secondary | ICD-10-CM | POA: Diagnosis not present

## 2018-12-20 DIAGNOSIS — S32020D Wedge compression fracture of second lumbar vertebra, subsequent encounter for fracture with routine healing: Secondary | ICD-10-CM | POA: Diagnosis not present

## 2018-12-20 NOTE — Telephone Encounter (Signed)
OK for SLP evaluation.

## 2018-12-20 NOTE — Telephone Encounter (Signed)
Darral Dash OT with [facility] called requesting verbal order for speech evaluation Reports that patient  is coughing with food and she worries about aspiration Please call with verbal order

## 2018-12-21 NOTE — Telephone Encounter (Signed)
[name] advised. 

## 2018-12-22 ENCOUNTER — Ambulatory Visit: Payer: Medicare Other | Admitting: Family Medicine

## 2018-12-22 ENCOUNTER — Telehealth: Payer: Self-pay | Admitting: Family Medicine

## 2018-12-22 DIAGNOSIS — S32010D Wedge compression fracture of first lumbar vertebra, subsequent encounter for fracture with routine healing: Secondary | ICD-10-CM | POA: Diagnosis not present

## 2018-12-22 DIAGNOSIS — I69354 Hemiplegia and hemiparesis following cerebral infarction affecting left non-dominant side: Secondary | ICD-10-CM | POA: Diagnosis not present

## 2018-12-22 DIAGNOSIS — G309 Alzheimer's disease, unspecified: Secondary | ICD-10-CM | POA: Diagnosis not present

## 2018-12-22 DIAGNOSIS — F0281 Dementia in other diseases classified elsewhere with behavioral disturbance: Secondary | ICD-10-CM | POA: Diagnosis not present

## 2018-12-22 DIAGNOSIS — W19XXXD Unspecified fall, subsequent encounter: Secondary | ICD-10-CM | POA: Diagnosis not present

## 2018-12-22 DIAGNOSIS — S32020D Wedge compression fracture of second lumbar vertebra, subsequent encounter for fracture with routine healing: Secondary | ICD-10-CM | POA: Diagnosis not present

## 2018-12-22 NOTE — Telephone Encounter (Signed)
Austin Weaver [phone]- can leave a msg) with [facility] called regarding pt. She is requesting to extend the PT  For 2 weeks, 2x a week to work on gait & balance.

## 2018-12-23 NOTE — Telephone Encounter (Signed)
Agree with extension.  

## 2018-12-23 NOTE — Telephone Encounter (Signed)
Austin Weaver advised 

## 2018-12-27 ENCOUNTER — Telehealth: Payer: Self-pay | Admitting: *Deleted

## 2018-12-27 DIAGNOSIS — F0281 Dementia in other diseases classified elsewhere with behavioral disturbance: Secondary | ICD-10-CM | POA: Diagnosis not present

## 2018-12-27 DIAGNOSIS — W19XXXD Unspecified fall, subsequent encounter: Secondary | ICD-10-CM | POA: Diagnosis not present

## 2018-12-27 DIAGNOSIS — G309 Alzheimer's disease, unspecified: Secondary | ICD-10-CM | POA: Diagnosis not present

## 2018-12-27 DIAGNOSIS — I69354 Hemiplegia and hemiparesis following cerebral infarction affecting left non-dominant side: Secondary | ICD-10-CM | POA: Diagnosis not present

## 2018-12-27 DIAGNOSIS — S32020D Wedge compression fracture of second lumbar vertebra, subsequent encounter for fracture with routine healing: Secondary | ICD-10-CM | POA: Diagnosis not present

## 2018-12-27 DIAGNOSIS — S32010D Wedge compression fracture of first lumbar vertebra, subsequent encounter for fracture with routine healing: Secondary | ICD-10-CM | POA: Diagnosis not present

## 2018-12-27 NOTE — Telephone Encounter (Signed)
Ok with going today

## 2018-12-27 NOTE — Telephone Encounter (Signed)
Amy OT with [facility] left a voicemail stating that they had gotten an order for speech therapist to access swallowing for patient. Amy stated that she was not able to go out last week, but plans on a visit today. Amy stated that if that is not okay to call her back and let her know.

## 2018-12-27 NOTE — Telephone Encounter (Signed)
Called Amy and advised okay to go today, left her a message

## 2018-12-29 DIAGNOSIS — S32020D Wedge compression fracture of second lumbar vertebra, subsequent encounter for fracture with routine healing: Secondary | ICD-10-CM | POA: Diagnosis not present

## 2018-12-29 DIAGNOSIS — F0281 Dementia in other diseases classified elsewhere with behavioral disturbance: Secondary | ICD-10-CM | POA: Diagnosis not present

## 2018-12-29 DIAGNOSIS — G309 Alzheimer's disease, unspecified: Secondary | ICD-10-CM | POA: Diagnosis not present

## 2018-12-29 DIAGNOSIS — S32010D Wedge compression fracture of first lumbar vertebra, subsequent encounter for fracture with routine healing: Secondary | ICD-10-CM | POA: Diagnosis not present

## 2018-12-29 DIAGNOSIS — W19XXXD Unspecified fall, subsequent encounter: Secondary | ICD-10-CM | POA: Diagnosis not present

## 2018-12-29 DIAGNOSIS — I69354 Hemiplegia and hemiparesis following cerebral infarction affecting left non-dominant side: Secondary | ICD-10-CM | POA: Diagnosis not present

## 2019-01-03 DIAGNOSIS — S32010D Wedge compression fracture of first lumbar vertebra, subsequent encounter for fracture with routine healing: Secondary | ICD-10-CM | POA: Diagnosis not present

## 2019-01-03 DIAGNOSIS — I69354 Hemiplegia and hemiparesis following cerebral infarction affecting left non-dominant side: Secondary | ICD-10-CM | POA: Diagnosis not present

## 2019-01-03 DIAGNOSIS — W19XXXD Unspecified fall, subsequent encounter: Secondary | ICD-10-CM | POA: Diagnosis not present

## 2019-01-03 DIAGNOSIS — S32020D Wedge compression fracture of second lumbar vertebra, subsequent encounter for fracture with routine healing: Secondary | ICD-10-CM | POA: Diagnosis not present

## 2019-01-03 DIAGNOSIS — G309 Alzheimer's disease, unspecified: Secondary | ICD-10-CM | POA: Diagnosis not present

## 2019-01-03 DIAGNOSIS — F0281 Dementia in other diseases classified elsewhere with behavioral disturbance: Secondary | ICD-10-CM | POA: Diagnosis not present

## 2019-01-05 DIAGNOSIS — S32010D Wedge compression fracture of first lumbar vertebra, subsequent encounter for fracture with routine healing: Secondary | ICD-10-CM | POA: Diagnosis not present

## 2019-01-05 DIAGNOSIS — Z7982 Long term (current) use of aspirin: Secondary | ICD-10-CM | POA: Diagnosis not present

## 2019-01-05 DIAGNOSIS — S32020D Wedge compression fracture of second lumbar vertebra, subsequent encounter for fracture with routine healing: Secondary | ICD-10-CM | POA: Diagnosis not present

## 2019-01-05 DIAGNOSIS — F17210 Nicotine dependence, cigarettes, uncomplicated: Secondary | ICD-10-CM | POA: Diagnosis not present

## 2019-01-05 DIAGNOSIS — F0281 Dementia in other diseases classified elsewhere with behavioral disturbance: Secondary | ICD-10-CM | POA: Diagnosis not present

## 2019-01-05 DIAGNOSIS — I69354 Hemiplegia and hemiparesis following cerebral infarction affecting left non-dominant side: Secondary | ICD-10-CM | POA: Diagnosis not present

## 2019-01-05 DIAGNOSIS — G309 Alzheimer's disease, unspecified: Secondary | ICD-10-CM | POA: Diagnosis not present

## 2019-01-05 DIAGNOSIS — Z79891 Long term (current) use of opiate analgesic: Secondary | ICD-10-CM | POA: Diagnosis not present

## 2019-01-05 DIAGNOSIS — W19XXXD Unspecified fall, subsequent encounter: Secondary | ICD-10-CM | POA: Diagnosis not present

## 2019-01-06 DIAGNOSIS — S32010D Wedge compression fracture of first lumbar vertebra, subsequent encounter for fracture with routine healing: Secondary | ICD-10-CM | POA: Diagnosis not present

## 2019-01-06 DIAGNOSIS — G309 Alzheimer's disease, unspecified: Secondary | ICD-10-CM | POA: Diagnosis not present

## 2019-01-06 DIAGNOSIS — S32020D Wedge compression fracture of second lumbar vertebra, subsequent encounter for fracture with routine healing: Secondary | ICD-10-CM | POA: Diagnosis not present

## 2019-01-06 DIAGNOSIS — I69354 Hemiplegia and hemiparesis following cerebral infarction affecting left non-dominant side: Secondary | ICD-10-CM | POA: Diagnosis not present

## 2019-01-06 DIAGNOSIS — W19XXXD Unspecified fall, subsequent encounter: Secondary | ICD-10-CM | POA: Diagnosis not present

## 2019-01-06 DIAGNOSIS — F0281 Dementia in other diseases classified elsewhere with behavioral disturbance: Secondary | ICD-10-CM | POA: Diagnosis not present

## 2019-01-26 ENCOUNTER — Telehealth: Payer: Self-pay | Admitting: Family Medicine

## 2019-01-26 NOTE — Telephone Encounter (Signed)
Called to discuss making 4/27 appt virtual. Talked with Austin Weaver and she said she will have pt's daughter call to discuss this.

## 2019-01-31 ENCOUNTER — Other Ambulatory Visit: Payer: Self-pay

## 2019-01-31 ENCOUNTER — Encounter: Payer: Self-pay | Admitting: Family Medicine

## 2019-01-31 ENCOUNTER — Ambulatory Visit (INDEPENDENT_AMBULATORY_CARE_PROVIDER_SITE_OTHER): Payer: Medicare Other | Admitting: Family Medicine

## 2019-01-31 VITALS — BP 153/95 | HR 79 | Ht 64.5 in | Wt 180.0 lb

## 2019-01-31 DIAGNOSIS — T7840XS Allergy, unspecified, sequela: Secondary | ICD-10-CM

## 2019-01-31 DIAGNOSIS — E538 Deficiency of other specified B group vitamins: Secondary | ICD-10-CM

## 2019-01-31 DIAGNOSIS — R682 Dry mouth, unspecified: Secondary | ICD-10-CM | POA: Diagnosis not present

## 2019-01-31 DIAGNOSIS — I10 Essential (primary) hypertension: Secondary | ICD-10-CM | POA: Diagnosis not present

## 2019-01-31 NOTE — Assessment & Plan Note (Signed)
BP on repeat improved. Stable, no changes - controlled on Amlodipine

## 2019-01-31 NOTE — Progress Notes (Signed)
I connected with Austin CopaWillie Augenstein on 01/31/19 at 11:00 AM EDT by video and verified that I am speaking with the correct person using two identifiers.   I discussed the limitations, risks, security and privacy concerns of performing an evaluation and management service by video and the availability of in person appointments. I also discussed with the patient that there may be a patient responsible charge related to this service. The patient expressed understanding and agreed to proceed.  Patient location: Home Provider Location: [facility] Participants: Lynnda ChildJessica R Cody and Austin Weaver and Daughter and Wife   Subjective:     Austin Weaver is a 73 y.o. male presenting for Follow-up (2 months follow up. Daughter, is there with the patient now for the virtual visit.); Hypertension; and Follow up B12     HPI   #Hypertension - BP is high today - had b - has been checking it every day  - 140/80, 125/60 - checks it frequently and it has been in the normal range - occasionally high when he is walking - no cp, sob, vision changes, HA  #B12 - Taking the oral replacement - feels like energy is good  #prior stroke - had home health coming - was a good experience -   #allergies symptoms - having mucus in the the throat - dry tongue - using lozenge - no itchy eyes, runny nose,  - occasional sneezing - endorses hx of allergies - thinks this improving   Review of Systems  No new neurological findings No cp, sob See HPI  12/01/2018: Clinic - CVA > continuing ASA, BP > elevated will reassess, Alzheimer's > PT with home health, which he reached a baseline, B-12 > would like oral replacement  Social History   Tobacco Use  Smoking Status Current Every Day Smoker  . Packs/day: 0.50  . Years: 20.00  . Pack years: 10.00  . Types: Cigarettes  Smokeless Tobacco Never Used        Objective:   BP Readings from Last 3 Encounters:  01/31/19 (!) 153/95   12/01/18 (!) 144/82  11/18/18 (!) 146/90   Wt Readings from Last 3 Encounters:  01/31/19 180 lb (81.6 kg)  12/01/18 179 lb (81.2 kg)  11/18/18 120 lb (54.4 kg)   BP (!) 153/95 Comment: per patient  Pulse 79 Comment: per patient  Ht 5' 4.5" (1.638 m)   Wt 180 lb (81.6 kg) Comment: per patient  BMI 30.42 kg/m   Repeat 130/79   Physical Exam Constitutional:      Appearance: Normal appearance. He is not ill-appearing.     Comments: Occasionally licking lips  HENT:     Head: Normocephalic and atraumatic.     Right Ear: External ear normal.     Left Ear: External ear normal.     Nose: Nose normal.  Eyes:     Conjunctiva/sclera: Conjunctivae normal.  Cardiovascular:     Rate and Rhythm: Normal rate.  Pulmonary:     Effort: Pulmonary effort is normal. No respiratory distress.  Musculoskeletal:     Comments: Ambulating well with walker  Neurological:     Mental Status: He is alert.  Psychiatric:        Mood and Affect: Mood normal.        Thought Content: Thought content normal.           Assessment & Plan:   Problem List Items Addressed This Visit      Cardiovascular and Mediastinum  Essential hypertension - Primary    BP on repeat improved. Stable, no changes - controlled on Amlodipine        Digestive   Dry mouth    Increase fluid intake, ok to continue lozenges         Other   Vitamin B12 deficiency    Doing well on oral replacement. Due to Covid will delay repeat test until follow-up visit.       Allergies    Recommended saline rinse and Flonase to help with mucus build-up          Return in about 6 months (around 08/02/2019) for AWV with nurse and chronic disease follow-up.  [name], MD

## 2019-01-31 NOTE — Assessment & Plan Note (Signed)
Recommended saline rinse and Flonase to help with mucus build-up

## 2019-01-31 NOTE — Assessment & Plan Note (Signed)
Doing well on oral replacement. Due to Covid will delay repeat test until follow-up visit.

## 2019-01-31 NOTE — Assessment & Plan Note (Signed)
Increase fluid intake, ok to continue lozenges

## 2019-03-01 ENCOUNTER — Encounter: Payer: Self-pay | Admitting: Family Medicine

## 2019-03-01 ENCOUNTER — Other Ambulatory Visit: Payer: Self-pay

## 2019-03-01 DIAGNOSIS — I10 Essential (primary) hypertension: Secondary | ICD-10-CM

## 2019-03-01 MED ORDER — AMLODIPINE BESYLATE 10 MG PO TABS: 10.0000 mg | ORAL_TABLET | Freq: Every day | ORAL | 1 refills | Status: DC

## 2019-03-01 NOTE — Telephone Encounter (Signed)
RX sent in for next refill for August.

## 2019-03-14 IMAGING — CT CT HEAD W/O CM
3 series · 15 of 47 positions shown, 18 images · non-contrast
Comparison: Head CT September 21, 2018 and brain MRI September 22, 2018

CLINICAL DATA: Pain following fall

EXAM:
CT HEAD WITHOUT CONTRAST
TECHNIQUE: Contiguous axial images were obtained from the base of the skull
through the vertex without intravenous contrast.

[Series 3: head 5.0 h30s · axial · 0.45mm/px · z∈[-51,+74]mm · 9 of 31 slices shown, 12 images]
[im 3/31  brain]
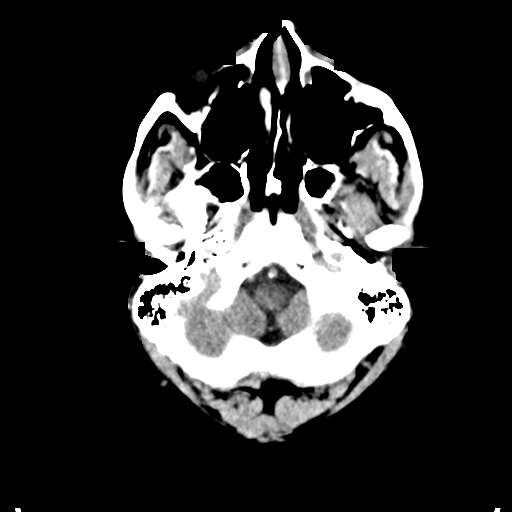
[im 3/31  bone]
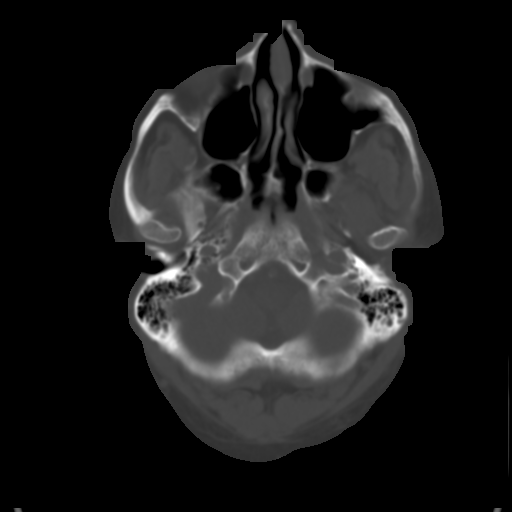
[im 6/31  brain]
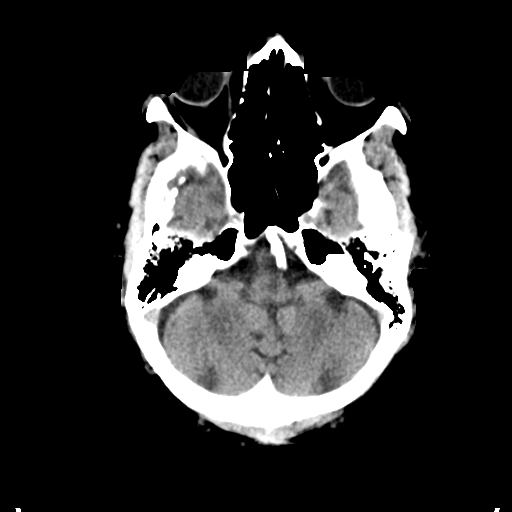
[im 9/31  brain]
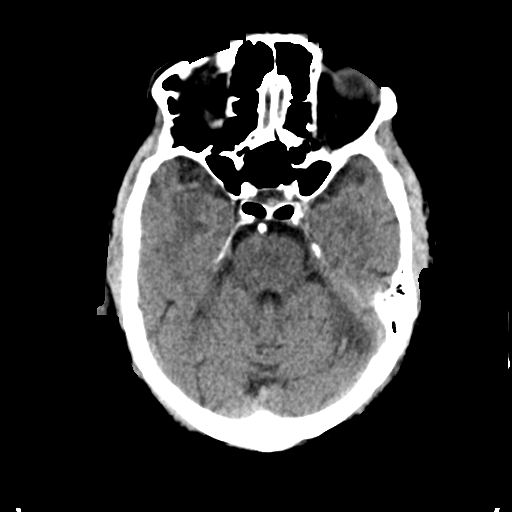
[im 12/31  brain]
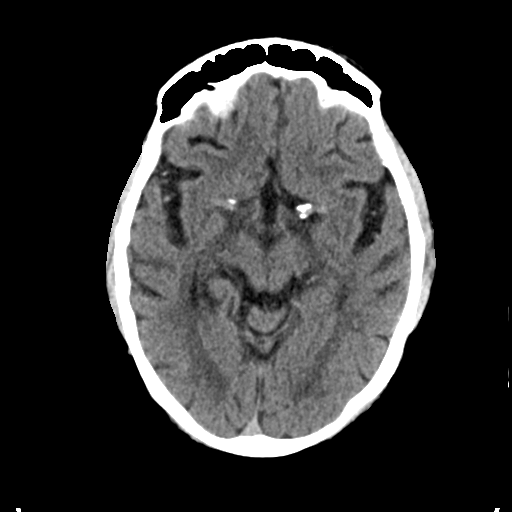
[im 16/31  brain]
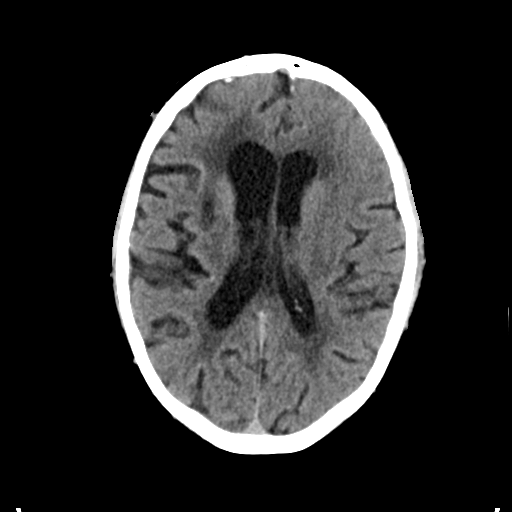
[im 16/31  bone]
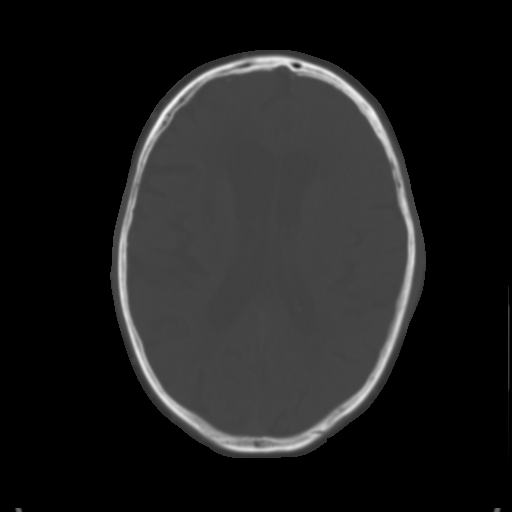
[im 19/31  brain]
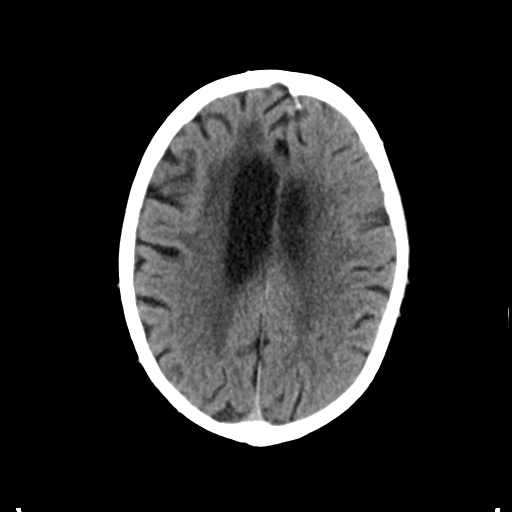
[im 22/31  brain]
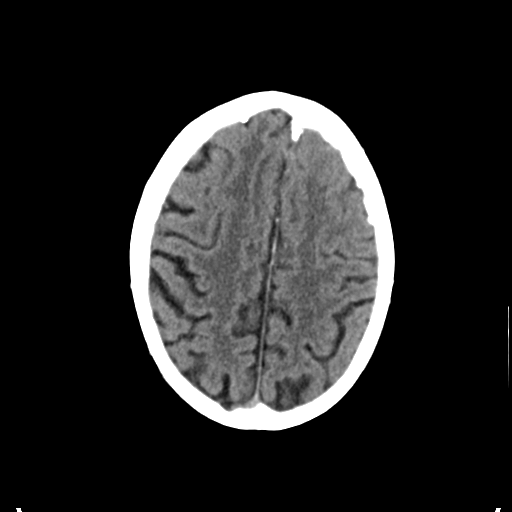
[im 25/31  brain]
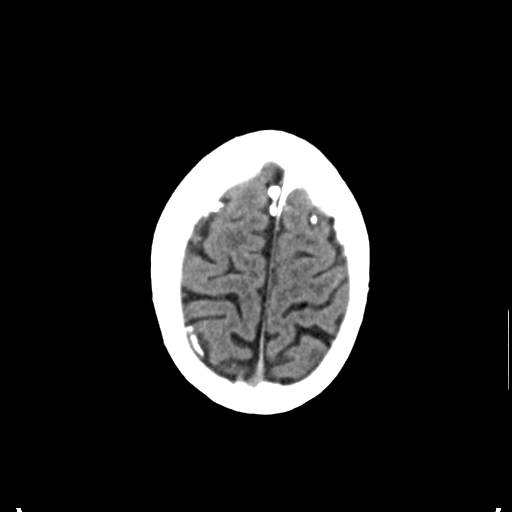
[im 28/31  brain]
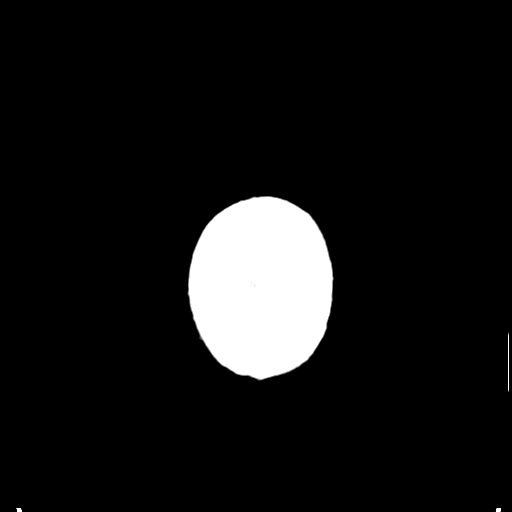
[im 28/31  bone]
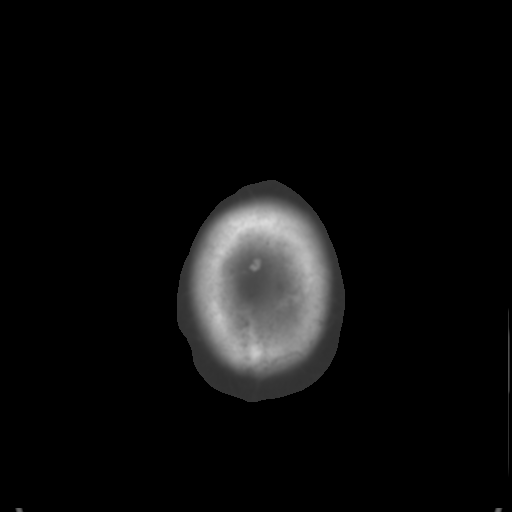

[Series 5: head 3.0 mpr cor · coronal · 0.30mm/px · 3 of 71 slices shown]
[im 24/71  brain]
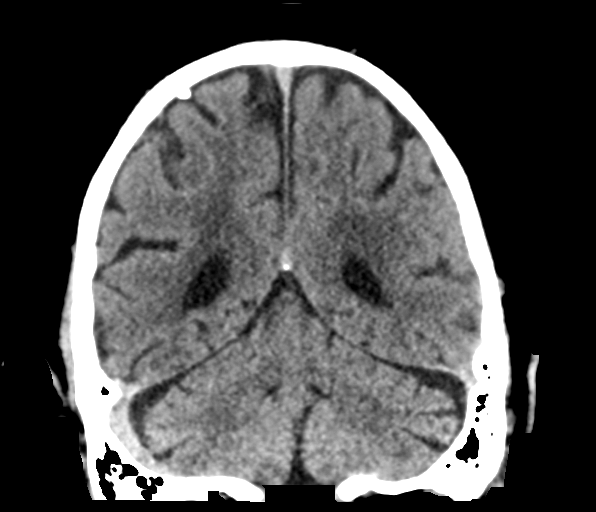
[im 32/71  brain]
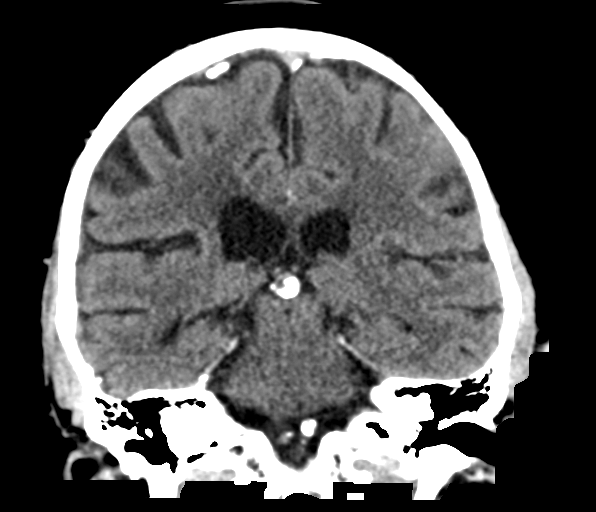
[im 39/71  brain]
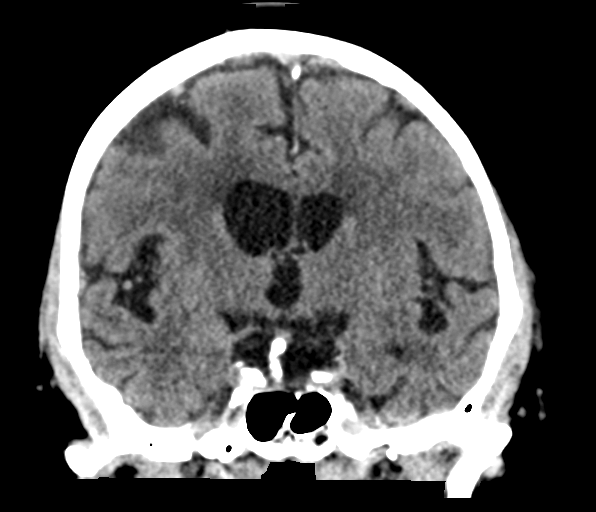

[Series 6: head 3.0 mpr sag · sagittal · 0.34mm/px · 3 of 61 slices shown]
[im 21/61  brain]
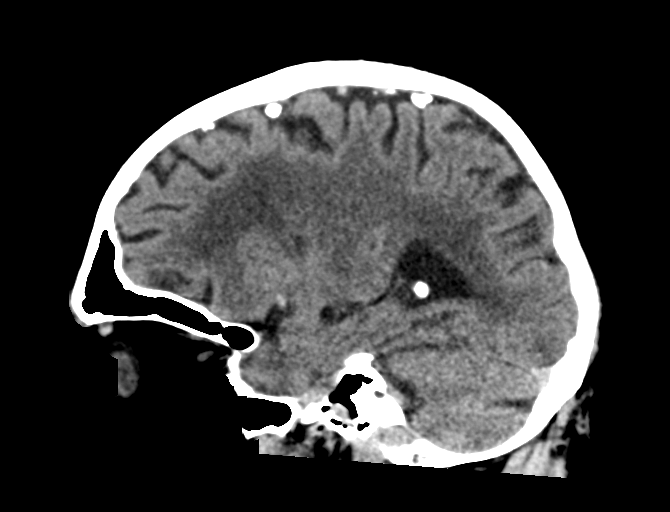
[im 31/61  brain]
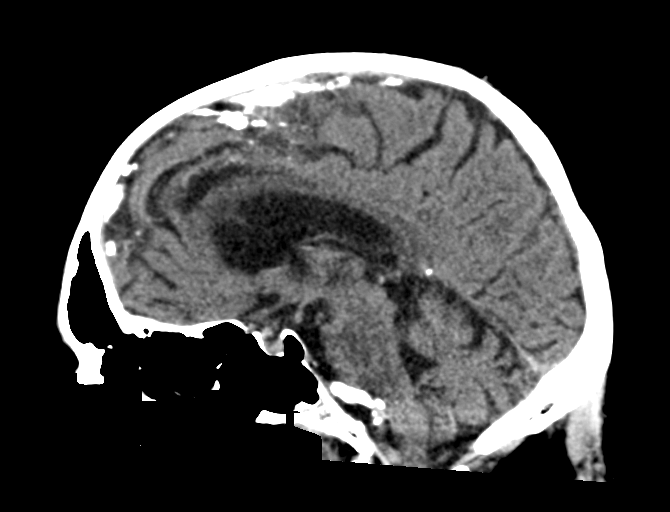
[im 41/61  brain]
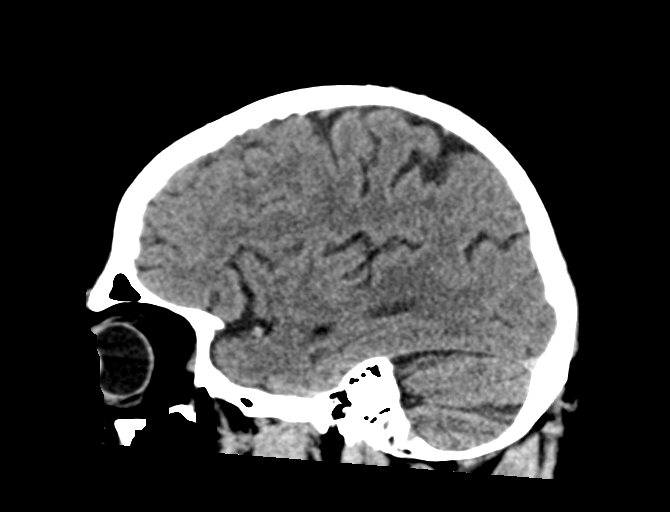

[15 of 47 positions shown; findings below may reference images not displayed]

FINDINGS: Brain: There is mild diffuse atrophy. Right lateral ventricle is
slightly larger than left lateral ventricle, probably due to ex
vacuo phenomenon. There is no intracranial mass, hemorrhage,
extra-axial fluid collection, or midline shift. There is small
vessel disease in the centra semiovale bilaterally, somewhat more
severe in the right frontal region than elsewhere. There is evidence
of prior infarct in the superior right lentiform nucleus region with
involvement of a portion of the right external capsule. This
appearance is stable. Stable chronic infarct left thalamus noted. No
acute appearing infarct evident.

Vascular: No hyperdense vessel. There is calcification in each
carotid siphon region.

Skull: Bony calvarium appears intact.

Sinuses/Orbits: There is slight mucosal thickening in several
ethmoid air cells. Other visualized paranasal sinuses are clear.
Orbits appear symmetric bilaterally.

Other: There is slight inferior mastoid air cell disease on the
right, stable. Mastoids elsewhere clear.
IMPRESSION: Atrophy with prior supratentorial small vessel disease and focal
infarcts in the right basal ganglia and left thalamus region. No
acute infarct. No mass or hemorrhage.

Foci of arterial vascular calcification noted.

Mild mucosal thickening in several ethmoid air cells. Slight
inferior right-sided mastoid air cell disease.

## 2019-05-12 ENCOUNTER — Telehealth: Payer: Self-pay

## 2019-05-12 NOTE — Telephone Encounter (Signed)
Austin Weaver with [employer] requested fax #. Fax #  [phone] given; nothing further needed.

## 2019-05-13 NOTE — Telephone Encounter (Signed)
Received fax from [employer] to be filled out. I spoke with patient and he states he did not request to have this done. Faxing form back declining this order. No further action needed at this time.

## 2019-06-01 ENCOUNTER — Other Ambulatory Visit: Payer: Self-pay | Admitting: *Deleted

## 2019-06-01 DIAGNOSIS — I639 Cerebral infarction, unspecified: Secondary | ICD-10-CM

## 2019-06-01 MED ORDER — ASPIRIN 325 MG PO TABS: 325.0000 mg | ORAL_TABLET | Freq: Every day | ORAL | 1 refills | Status: DC

## 2019-08-10 ENCOUNTER — Ambulatory Visit: Payer: Medicare Other | Admitting: Family Medicine

## 2019-10-25 ENCOUNTER — Other Ambulatory Visit: Payer: Self-pay | Admitting: Family Medicine

## 2019-10-25 DIAGNOSIS — I10 Essential (primary) hypertension: Secondary | ICD-10-CM

## 2019-10-25 NOTE — Telephone Encounter (Signed)
Please schedule patient for AWV and then follow up with Dr Selena Batten when possible. Thank you

## 2019-10-26 NOTE — Telephone Encounter (Signed)
Pt has been scheduled.  °

## 2019-11-16 ENCOUNTER — Telehealth: Payer: Self-pay

## 2019-11-16 ENCOUNTER — Other Ambulatory Visit: Payer: Medicare Other

## 2019-11-16 ENCOUNTER — Other Ambulatory Visit: Payer: Self-pay

## 2019-11-16 ENCOUNTER — Ambulatory Visit: Payer: Medicare HMO

## 2019-11-16 NOTE — Telephone Encounter (Signed)
FYI- Called patient to complete Medicare Wellness visit. Woman answered the phone and said that patient was not at home. Advised woman to let patient know I had called and visit will be completed next week with physical. Appointment cancelled.

## 2019-11-23 ENCOUNTER — Ambulatory Visit: Payer: Medicare Other | Admitting: Family Medicine

## 2019-11-30 ENCOUNTER — Ambulatory Visit: Payer: Medicare HMO | Admitting: Family Medicine

## 2019-11-30 DIAGNOSIS — Z0289 Encounter for other administrative examinations: Secondary | ICD-10-CM

## 2019-12-13 ENCOUNTER — Telehealth: Payer: Self-pay | Admitting: Family Medicine

## 2019-12-13 DIAGNOSIS — I639 Cerebral infarction, unspecified: Secondary | ICD-10-CM

## 2019-12-13 MED ORDER — ATORVASTATIN CALCIUM 80 MG PO TABS: 80.0000 mg | ORAL_TABLET | Freq: Every day | ORAL | 0 refills | Status: DC

## 2019-12-13 NOTE — Telephone Encounter (Signed)
Austin Weaver with [facility] called today Stated they received a call from [facility] refills. They advised her to contact our office because the refill had been denied due to not being seen recently. And [name] stated they just had phone visit about a month ago.  I do not see where the patient had an appointment recently. They had virtual 01/31/2019   Please advise

## 2019-12-13 NOTE — Telephone Encounter (Signed)
Called and spoke with patient. Scheduled for follow up on 01/11/20. I called Austin Weaver to let her know what is going on, and that nurse phone visit in February was missed but she needed to go because her mom was in the hospital and she was trying to take care of things at the hospital. Refilled Lipitor to last till next appointment

## 2020-01-11 ENCOUNTER — Ambulatory Visit (INDEPENDENT_AMBULATORY_CARE_PROVIDER_SITE_OTHER): Payer: Medicare HMO | Admitting: Family Medicine

## 2020-01-11 ENCOUNTER — Encounter: Payer: Self-pay | Admitting: Family Medicine

## 2020-01-11 ENCOUNTER — Other Ambulatory Visit: Payer: Self-pay

## 2020-01-11 VITALS — BP 135/66 | HR 80 | Temp 97.1°F

## 2020-01-11 DIAGNOSIS — I10 Essential (primary) hypertension: Secondary | ICD-10-CM | POA: Diagnosis not present

## 2020-01-11 DIAGNOSIS — E782 Mixed hyperlipidemia: Secondary | ICD-10-CM | POA: Insufficient documentation

## 2020-01-11 DIAGNOSIS — E785 Hyperlipidemia, unspecified: Secondary | ICD-10-CM | POA: Insufficient documentation

## 2020-01-11 DIAGNOSIS — I639 Cerebral infarction, unspecified: Secondary | ICD-10-CM

## 2020-01-11 MED ORDER — ATORVASTATIN CALCIUM 80 MG PO TABS: 80.0000 mg | ORAL_TABLET | Freq: Every day | ORAL | 0 refills | Status: DC

## 2020-01-11 MED ORDER — LISINOPRIL 10 MG PO TABS: 10.0000 mg | ORAL_TABLET | Freq: Every day | ORAL | 3 refills | Status: DC

## 2020-01-11 NOTE — Assessment & Plan Note (Signed)
Pt complaining of swelling b/l hands/feet. Stop amlodipine and start lisinopril. Return 1 month for labs and BP check.

## 2020-01-11 NOTE — Assessment & Plan Note (Signed)
Continue atorvastatin and asa.

## 2020-01-11 NOTE — Progress Notes (Signed)
Virtual Visit via Telephone Note  I connected with Austin Weaver on 01/11/20 at 11:20 AM EDT by telephone and verified that I am speaking with the correct person using two identifiers.   I discussed the limitations, risks, security and privacy concerns of performing an evaluation and management service by telephone and the availability of in person appointments. I also discussed with the patient that there may be a patient responsible charge related to this service. The patient expressed understanding and agreed to proceed.  Patient location: Home Provider Location: [facility] Participants: [name] and [name]   History of Present Illness: Chief Complaint  Patient presents with  . Follow-up  . Hyperlipidemia    feet and hands swells up after taking Atorvastatin 80 mg    HPI  #HTN - no chest pain or breathing difficulty - taking amlodipine - hands a feet are swelling up  - no hx of trying other medication before that   ROS    Observations/Objective: BP 135/66 Comment: per patient  Pulse 80 Comment: per patient  Temp (!) 97.1 F (36.2 C) Comment: per patient  Phone visit:  Patient speaking in complete sentences No distress Alert and oriented Normal mood  Assessment and Plan: Problem List Items Addressed This Visit      Cardiovascular and Mediastinum   CVA (cerebral vascular accident) [facility]), old    Continue atorvastatin and asa.       Relevant Medications   lisinopril (ZESTRIL) 10 MG tablet   atorvastatin (LIPITOR) 80 MG tablet   Essential hypertension - Primary    Pt complaining of swelling b/l hands/feet. Stop amlodipine and start lisinopril. Return 1 month for labs and BP check.       Relevant Medications   lisinopril (ZESTRIL) 10 MG tablet   atorvastatin (LIPITOR) 80 MG tablet     Other   Hyperlipidemia   Relevant Medications   lisinopril (ZESTRIL) 10 MG tablet   atorvastatin (LIPITOR) 80 MG tablet       Follow Up  Instructions:  Return in about 4 weeks (around 02/08/2020) for BP and blood work.   I discussed the assessment and treatment plan with the patient. The patient was provided an opportunity to ask questions and all were answered. The patient agreed with the plan and demonstrated an understanding of the instructions.   The patient was advised to call back or seek an in-person evaluation if the symptoms worsen or if the condition fails to improve as anticipated.  I provided 8 minutes of non-face-to-face time during this encounter.   [name], MD

## 2021-04-16 ENCOUNTER — Encounter: Payer: Medicare HMO | Admitting: Family Medicine

## 2021-05-01 ENCOUNTER — Encounter: Payer: Self-pay | Admitting: Family Medicine

## 2021-05-01 ENCOUNTER — Ambulatory Visit (INDEPENDENT_AMBULATORY_CARE_PROVIDER_SITE_OTHER): Payer: Medicare HMO | Admitting: Family Medicine

## 2021-05-01 ENCOUNTER — Telehealth: Payer: Self-pay | Admitting: *Deleted

## 2021-05-01 ENCOUNTER — Other Ambulatory Visit: Payer: Self-pay

## 2021-05-01 VITALS — BP 196/102 | HR 81 | Temp 97.8°F | Ht 64.5 in | Wt 182.0 lb

## 2021-05-01 DIAGNOSIS — F02818 Dementia in other diseases classified elsewhere, unspecified severity, with other behavioral disturbance: Secondary | ICD-10-CM

## 2021-05-01 DIAGNOSIS — G308 Other Alzheimer's disease: Secondary | ICD-10-CM

## 2021-05-01 DIAGNOSIS — I10 Essential (primary) hypertension: Secondary | ICD-10-CM

## 2021-05-01 DIAGNOSIS — Z5982 Transportation insecurity: Secondary | ICD-10-CM

## 2021-05-01 DIAGNOSIS — Z1159 Encounter for screening for other viral diseases: Secondary | ICD-10-CM | POA: Diagnosis not present

## 2021-05-01 DIAGNOSIS — R531 Weakness: Secondary | ICD-10-CM | POA: Diagnosis not present

## 2021-05-01 DIAGNOSIS — E782 Mixed hyperlipidemia: Secondary | ICD-10-CM

## 2021-05-01 DIAGNOSIS — Z8673 Personal history of transient ischemic attack (TIA), and cerebral infarction without residual deficits: Secondary | ICD-10-CM | POA: Diagnosis not present

## 2021-05-01 DIAGNOSIS — Z5941 Food insecurity: Secondary | ICD-10-CM | POA: Diagnosis not present

## 2021-05-01 DIAGNOSIS — Z9189 Other specified personal risk factors, not elsewhere classified: Secondary | ICD-10-CM

## 2021-05-01 DIAGNOSIS — R7309 Other abnormal glucose: Secondary | ICD-10-CM | POA: Diagnosis not present

## 2021-05-01 DIAGNOSIS — I69954 Hemiplegia and hemiparesis following unspecified cerebrovascular disease affecting left non-dominant side: Secondary | ICD-10-CM | POA: Insufficient documentation

## 2021-05-01 DIAGNOSIS — F0281 Dementia in other diseases classified elsewhere with behavioral disturbance: Secondary | ICD-10-CM

## 2021-05-01 LAB — LIPID PANEL
Cholesterol: 179 mg/dL (ref 0–200)
HDL: 47.6 mg/dL (ref >39.00)
LDL Cholesterol: 116 mg/dL — ABNORMAL HIGH (ref 0–99)
NonHDL: 131.68
Total CHOL/HDL Ratio: 4
Triglycerides: 79 mg/dL (ref 0.0–149.0)
VLDL: 15.8 mg/dL (ref 0.0–40.0)

## 2021-05-01 LAB — COMPREHENSIVE METABOLIC PANEL
ALT: 14 U/L (ref 0–53)
AST: 16 U/L (ref 0–37)
Albumin: 4.2 g/dL (ref 3.5–5.2)
Alkaline Phosphatase: 64 U/L (ref 39–117)
BUN: 9 mg/dL (ref 6–23)
CO2: 25 mEq/L (ref 19–32)
Calcium: 9.4 mg/dL (ref 8.4–10.5)
Chloride: 101 mEq/L (ref 96–112)
Creatinine, Ser: 1.34 mg/dL (ref 0.40–1.50)
GFR: 52.54 mL/min — ABNORMAL LOW (ref >60.00)
Glucose, Bld: 82 mg/dL (ref 70–99)
Potassium: 4.4 mEq/L (ref 3.5–5.1)
Sodium: 136 mEq/L (ref 135–145)
Total Bilirubin: 0.6 mg/dL (ref 0.2–1.2)
Total Protein: 7.7 g/dL (ref 6.0–8.3)

## 2021-05-01 LAB — CBC
HCT: 49.9 % (ref 39.0–52.0)
Hemoglobin: 16.5 g/dL (ref 13.0–17.0)
MCHC: 33.1 g/dL (ref 30.0–36.0)
MCV: 91.1 fl (ref 78.0–100.0)
Platelets: 121 10*3/uL — ABNORMAL LOW (ref 150.0–400.0)
RBC: 5.48 Mil/uL (ref 4.22–5.81)
RDW: 15.4 % (ref 11.5–15.5)
WBC: 5.1 10*3/uL (ref 4.0–10.5)

## 2021-05-01 LAB — HEMOGLOBIN A1C: Hgb A1c MFr Bld: 5.5 % (ref 4.6–6.5)

## 2021-05-01 MED ORDER — LISINOPRIL 10 MG PO TABS: 10.0000 mg | ORAL_TABLET | Freq: Every day | ORAL | 3 refills | Status: DC

## 2021-05-01 MED ORDER — ATORVASTATIN CALCIUM 80 MG PO TABS: 80.0000 mg | ORAL_TABLET | Freq: Every day | ORAL | 3 refills | Status: DC

## 2021-05-01 NOTE — Progress Notes (Signed)
Subjective:     Austin Weaver is a 75 y.o. male presenting for Annual Exam     HPI  Pt has run out of medications Not taking anything Transportation is a barrier to getting to appointments   #HTN - bp cuff at home is broken - has been out of medication - swelling improved off amlodipine - reports no side effects to lisinopril - when he was able to check at home bp 120/80 - no ha, vision changes, sob, cp  Nervous about being at the doctor  #Hx of cva - out of medication - left sided weakness - mostly impacting his walking - does not use cane/walker  #memory - he feels he is doing pretty good - not worsening  Endorses food insecurity  Review of Systems   Social History   Tobacco Use  Smoking Status Every Day   Packs/day: 0.50   Years: 20.00   Pack years: 10.00   Types: Cigarettes  Smokeless Tobacco Never        Objective:    BP Readings from Last 3 Encounters:  05/01/21 (!) 196/102  01/11/20 135/66  01/31/19 (!) 153/95   Wt Readings from Last 3 Encounters:  05/01/21 182 lb (82.6 kg)  01/31/19 180 lb (81.6 kg)  12/01/18 179 lb (81.2 kg)    BP (!) 196/102   Pulse 81   Temp 97.8 F (36.6 C) (Temporal)   Ht 5' 4.5" (1.638 m)   Wt 182 lb (82.6 kg)   SpO2 97%   BMI 30.76 kg/m    Physical Exam Constitutional:      Appearance: Normal appearance. He is not ill-appearing or diaphoretic.  HENT:     Right Ear: External ear normal.     Left Ear: External ear normal.     Nose: Nose normal.  Eyes:     General: No scleral icterus.    Extraocular Movements: Extraocular movements intact.     Conjunctiva/sclera: Conjunctivae normal.  Cardiovascular:     Rate and Rhythm: Normal rate and regular rhythm.  Pulmonary:     Effort: Pulmonary effort is normal. No respiratory distress.     Breath sounds: Normal breath sounds. No wheezing.  Musculoskeletal:     Cervical back: Neck supple.  Skin:    General: Skin is warm and dry.  Neurological:      Mental Status: He is alert. Mental status is at baseline.     Sensory: No sensory deficit.     Motor: No weakness.     Comments: UE strength: 5/5 LE strength: 4+/5 on left side  Psychiatric:        Mood and Affect: Mood normal.          Assessment & Plan:   Problem List Items Addressed This Visit       Cardiovascular and Mediastinum   Essential hypertension - Primary    BP poorly controlled in setting of running out of medications. BP cuff at home broken but good control when last checked and no symptoms. Refill lisinopril. Labs today. Return 4 weeks.        Relevant Medications   atorvastatin (LIPITOR) 80 MG tablet   lisinopril (ZESTRIL) 10 MG tablet   Other Relevant Orders   Comprehensive metabolic panel   CBC     Nervous and Auditory   Alzheimer's dementia with behavioral disturbance (HCC)    Pt and partner feel he is currently stable. Will do memory assessment with annual.  Left-sided weakness    Post CVA. Worse on LE and impacting walking though not using walker today. Discussed physical therapy - do think this far out he would benefit from in-office therapy. Will work on regular transportation if improved and stable will plan PT referral for strengthening and gait training         Other   History of stroke    Discussed importance of taking asa 81 and atorvastatin 80 mg daily. Labs today       Relevant Medications   atorvastatin (LIPITOR) 80 MG tablet   Other Relevant Orders   Comprehensive metabolic panel   Lipid panel   CBC   Food insecurity    Referral to SW to help find and connect with resources.        Relevant Orders   AMB Referral to [facility] Coordin[facility]   Hyperlipidemia   Relevant Medications   atorvastatin (LIPITOR) 80 MG tablet   lisinopril (ZESTRIL) 10 MG tablet   Other Relevant Orders   Lipid panel   Lack of access to transportation    Pt has not been seen in >1 year due to transportation limitations. Referral to  SW to help with insurance based transportation services for routine visits and hopefully PT       Relevant Orders   AMB Referral to [facility]ordin[facility]   Other Visit Diagnoses     Elevated glucose       Relevant Orders   Hemoglobin A1c   Need for hepatitis C screening test       Relevant Orders   Hepatitis C antibody        Return in about 4 weeks (around 05/29/2021) for Medicare wellnss - schedule with health nurse.  [name], MD  This visit occurred during the SARS-CoV-2 public health emergency.  Safety protocols were in place, including screening questions prior to the visit, additional usage of staff PPE, and extensive cleaning of exam room while observing appropriate contact time as indicated for disinfecting solutions.

## 2021-05-01 NOTE — Assessment & Plan Note (Signed)
Post CVA. Worse on LE and impacting walking though not using walker today. Discussed physical therapy - do think this far out he would benefit from in-office therapy. Will work on regular transportation if improved and stable will plan PT referral for strengthening and gait training

## 2021-05-01 NOTE — Assessment & Plan Note (Signed)
Discussed importance of taking asa 81 and atorvastatin 80 mg daily. Labs today

## 2021-05-01 NOTE — Assessment & Plan Note (Signed)
Pt has not been seen in >1 year due to transportation limitations. Referral to SW to help with insurance based transportation services for routine visits and hopefully PT

## 2021-05-01 NOTE — Patient Instructions (Addendum)
#  Blood pressure - try to check at home - Restart lisinopril - labs today - return in 1 month  #Food and Transportation - social work consult to help with this - you should get a phone call   Schedule your wellness visit  - 2 part visit - part one with health nurse - part 2 with me

## 2021-05-01 NOTE — Assessment & Plan Note (Signed)
Referral to SW to help find and connect with resources.

## 2021-05-01 NOTE — Assessment & Plan Note (Signed)
BP poorly controlled in setting of running out of medications. BP cuff at home broken but good control when last checked and no symptoms. Refill lisinopril. Labs today. Return 4 weeks.

## 2021-05-01 NOTE — Assessment & Plan Note (Signed)
Pt and partner feel he is currently stable. Will do memory assessment with annual.

## 2021-05-01 NOTE — Telephone Encounter (Signed)
   Telephone encounter was:  Unsuccessful.  05/01/2021 Name: Austin Weaver MRN: [id number] DOB: Jun 21, 1946  Unsuccessful outbound call made today to assist with:  Transportation Needs  and Food Insecurity  Outreach Attempt:  1st Attempt   Phone busy   Alois Cliche -[facility] Guide , Embedded Care Coordination [facility], Care Management  [phone] [address] [place] , [place] [address] Email : [email or web address]

## 2021-05-02 LAB — HEPATITIS C ANTIBODY
Hepatitis C Ab: NONREACTIVE
SIGNAL TO CUT-OFF: 0.01 (ref <1.00)

## 2021-05-03 ENCOUNTER — Telehealth: Payer: Self-pay | Admitting: *Deleted

## 2021-05-06 ENCOUNTER — Telehealth: Payer: Self-pay | Admitting: *Deleted

## 2021-05-06 NOTE — Telephone Encounter (Signed)
   Telephone encounter was:  Unsuccessful.  05/06/2021 Name: Austin Weaver MRN: [id number] DOB: 1946-04-15  Unsuccessful outbound call made today to assist with:  Transportation Needs  and Food Insecurity  Outreach Attempt:  3rd Attempt.  Referral closed unable to contact patient.  A HIPAA compliant voice message was left requesting a return call.  Instructed patient to call back at   Instructed patient to call back at [phone]  at their earliest convenience. Yehuda Mao Greenauer -[facility] Guide , Embedded Care Coordination [facility], Care Management  [phone] [address] [place] , [place] [address] Email : [email or web address]

## 2021-05-29 ENCOUNTER — Ambulatory Visit: Payer: Medicare HMO

## 2021-10-05 ENCOUNTER — Inpatient Hospital Stay
Admission: EM | Admit: 2021-10-05 | Discharge: 2021-10-10 | DRG: 177 | Disposition: A | Discharge status: Home Health | Payer: Medicare HMO | Attending: Internal Medicine | Admitting: Internal Medicine

## 2021-10-05 ENCOUNTER — Encounter: Payer: Self-pay | Admitting: Emergency Medicine

## 2021-10-05 ENCOUNTER — Emergency Department: Payer: Medicare HMO

## 2021-10-05 ENCOUNTER — Other Ambulatory Visit: Payer: Self-pay

## 2021-10-05 ENCOUNTER — Observation Stay: Payer: Medicare HMO

## 2021-10-05 DIAGNOSIS — R0902 Hypoxemia: Secondary | ICD-10-CM | POA: Diagnosis present

## 2021-10-05 DIAGNOSIS — U071 COVID-19: Principal | ICD-10-CM | POA: Diagnosis present

## 2021-10-05 DIAGNOSIS — G9340 Encephalopathy, unspecified: Secondary | ICD-10-CM | POA: Diagnosis present

## 2021-10-05 DIAGNOSIS — G9341 Metabolic encephalopathy: Secondary | ICD-10-CM | POA: Diagnosis present

## 2021-10-05 DIAGNOSIS — E782 Mixed hyperlipidemia: Secondary | ICD-10-CM | POA: Diagnosis present

## 2021-10-05 DIAGNOSIS — E538 Deficiency of other specified B group vitamins: Secondary | ICD-10-CM | POA: Diagnosis present

## 2021-10-05 DIAGNOSIS — D72819 Decreased white blood cell count, unspecified: Secondary | ICD-10-CM | POA: Diagnosis present

## 2021-10-05 DIAGNOSIS — G9389 Other specified disorders of brain: Secondary | ICD-10-CM | POA: Diagnosis not present

## 2021-10-05 DIAGNOSIS — I10 Essential (primary) hypertension: Secondary | ICD-10-CM | POA: Diagnosis present

## 2021-10-05 DIAGNOSIS — R531 Weakness: Secondary | ICD-10-CM | POA: Diagnosis not present

## 2021-10-05 DIAGNOSIS — I69354 Hemiplegia and hemiparesis following cerebral infarction affecting left non-dominant side: Secondary | ICD-10-CM | POA: Diagnosis not present

## 2021-10-05 DIAGNOSIS — F17210 Nicotine dependence, cigarettes, uncomplicated: Secondary | ICD-10-CM | POA: Diagnosis present

## 2021-10-05 DIAGNOSIS — I160 Hypertensive urgency: Secondary | ICD-10-CM | POA: Diagnosis present

## 2021-10-05 DIAGNOSIS — G309 Alzheimer's disease, unspecified: Secondary | ICD-10-CM | POA: Diagnosis present

## 2021-10-05 DIAGNOSIS — F02818 Dementia in other diseases classified elsewhere, unspecified severity, with other behavioral disturbance: Secondary | ICD-10-CM | POA: Diagnosis present

## 2021-10-05 DIAGNOSIS — Z7982 Long term (current) use of aspirin: Secondary | ICD-10-CM | POA: Diagnosis not present

## 2021-10-05 DIAGNOSIS — R4182 Altered mental status, unspecified: Secondary | ICD-10-CM | POA: Diagnosis not present

## 2021-10-05 DIAGNOSIS — Z8249 Family history of ischemic heart disease and other diseases of the circulatory system: Secondary | ICD-10-CM

## 2021-10-05 DIAGNOSIS — Z79899 Other long term (current) drug therapy: Secondary | ICD-10-CM | POA: Diagnosis not present

## 2021-10-05 DIAGNOSIS — R0689 Other abnormalities of breathing: Secondary | ICD-10-CM | POA: Diagnosis not present

## 2021-10-05 DIAGNOSIS — Z7401 Bed confinement status: Secondary | ICD-10-CM | POA: Diagnosis not present

## 2021-10-05 DIAGNOSIS — R404 Transient alteration of awareness: Secondary | ICD-10-CM | POA: Diagnosis not present

## 2021-10-05 DIAGNOSIS — R410 Disorientation, unspecified: Secondary | ICD-10-CM | POA: Diagnosis not present

## 2021-10-05 LAB — CBG MONITORING, ED: Glucose-Capillary: 99 mg/dL (ref 70–99)

## 2021-10-05 LAB — COMPREHENSIVE METABOLIC PANEL
ALT: 21 U/L (ref 0–44)
AST: 20 U/L (ref 15–41)
Albumin: 4.3 g/dL (ref 3.5–5.0)
Alkaline Phosphatase: 78 U/L (ref 38–126)
Anion gap: 9 (ref 5–15)
BUN: 13 mg/dL (ref 8–23)
CO2: 27 mmol/L (ref 22–32)
Calcium: 9.3 mg/dL (ref 8.9–10.3)
Chloride: 100 mmol/L (ref 98–111)
Creatinine, Ser: 1.18 mg/dL (ref 0.61–1.24)
GFR, Estimated: >60 mL/min (ref >60)
Glucose, Bld: 99 mg/dL (ref 70–99)
Potassium: 4.4 mmol/L (ref 3.5–5.1)
Sodium: 136 mmol/L (ref 135–145)
Total Bilirubin: 0.8 mg/dL (ref 0.3–1.2)
Total Protein: 8.3 g/dL — ABNORMAL HIGH (ref 6.5–8.1)

## 2021-10-05 LAB — CBC
HCT: 50.9 % (ref 39.0–52.0)
Hemoglobin: 17.4 g/dL — ABNORMAL HIGH (ref 13.0–17.0)
MCH: 30.4 pg (ref 26.0–34.0)
MCHC: 34.2 g/dL (ref 30.0–36.0)
MCV: 89 fL (ref 80.0–100.0)
Platelets: 149 10*3/uL — ABNORMAL LOW (ref 150–400)
RBC: 5.72 MIL/uL (ref 4.22–5.81)
RDW: 14.6 % (ref 11.5–15.5)
WBC: 6.6 10*3/uL (ref 4.0–10.5)
nRBC: 0 % (ref 0.0–0.2)

## 2021-10-05 LAB — LACTIC ACID, PLASMA: Lactic Acid, Venous: 1.7 mmol/L (ref 0.5–1.9)

## 2021-10-05 LAB — RESP PANEL BY RT-PCR (FLU A&B, COVID) ARPGX2
Influenza A by PCR: NEGATIVE
Influenza B by PCR: NEGATIVE
SARS Coronavirus 2 by RT PCR: POSITIVE — AB

## 2021-10-05 MED ORDER — LISINOPRIL 10 MG PO TABS: 10.0000 mg | ORAL_TABLET | Freq: Every day | ORAL | Status: DC

## 2021-10-05 MED ORDER — ZINC SULFATE 220 (50 ZN) MG PO CAPS
220.0000 mg | ORAL_CAPSULE | Freq: Every day | ORAL | Status: DC
  Administered 2021-10-06 – 2021-10-10 (×5): 220 mg via ORAL
  Filled 2021-10-05 (×5): qty 1

## 2021-10-05 MED ORDER — ACETAMINOPHEN 650 MG RE SUPP: 650.0000 mg | Freq: Four times a day (QID) | RECTAL | Status: DC | PRN

## 2021-10-05 MED ORDER — POLYETHYLENE GLYCOL 3350 17 G PO PACK: 17.0000 g | PACK | Freq: Every day | ORAL | Status: DC | PRN

## 2021-10-05 MED ORDER — LACTATED RINGERS IV SOLN: INTRAVENOUS | Status: AC

## 2021-10-05 MED ORDER — HYDRALAZINE HCL 20 MG/ML IJ SOLN
10.0000 mg | Freq: Four times a day (QID) | INTRAMUSCULAR | Status: DC | PRN
  Administered 2021-10-06: 10 mg via INTRAVENOUS
  Filled 2021-10-05: qty 1

## 2021-10-05 MED ORDER — ACETAMINOPHEN 325 MG PO TABS
650.0000 mg | ORAL_TABLET | Freq: Four times a day (QID) | ORAL | Status: DC | PRN
  Administered 2021-10-06 – 2021-10-07 (×2): 650 mg via ORAL
  Filled 2021-10-05 (×3): qty 2

## 2021-10-05 MED ORDER — LACTATED RINGERS IV BOLUS
500.0000 mL | Freq: Once | INTRAVENOUS | Status: AC
  Administered 2021-10-06: 500 mL via INTRAVENOUS

## 2021-10-05 MED ORDER — ENOXAPARIN SODIUM 40 MG/0.4ML IJ SOSY
40.0000 mg | PREFILLED_SYRINGE | INTRAMUSCULAR | Status: DC
  Administered 2021-10-06 – 2021-10-10 (×5): 40 mg via SUBCUTANEOUS
  Filled 2021-10-05 (×5): qty 0.4

## 2021-10-05 MED ORDER — GUAIFENESIN-DM 100-10 MG/5ML PO SYRP: 10.0000 mL | ORAL_SOLUTION | ORAL | Status: DC | PRN

## 2021-10-05 MED ORDER — ONDANSETRON HCL 4 MG PO TABS: 4.0000 mg | ORAL_TABLET | Freq: Four times a day (QID) | ORAL | Status: DC | PRN

## 2021-10-05 MED ORDER — ASCORBIC ACID 500 MG PO TABS
500.0000 mg | ORAL_TABLET | Freq: Every day | ORAL | Status: DC
  Administered 2021-10-06 – 2021-10-10 (×5): 500 mg via ORAL
  Filled 2021-10-05 (×5): qty 1

## 2021-10-05 MED ORDER — ONDANSETRON HCL 4 MG/2ML IJ SOLN: 4.0000 mg | Freq: Four times a day (QID) | INTRAMUSCULAR | Status: DC | PRN

## 2021-10-05 MED ORDER — ATORVASTATIN CALCIUM 20 MG PO TABS
80.0000 mg | ORAL_TABLET | Freq: Every day | ORAL | Status: DC
  Administered 2021-10-06 – 2021-10-10 (×5): 80 mg via ORAL
  Filled 2021-10-05 (×5): qty 4

## 2021-10-05 MED ORDER — ALBUTEROL SULFATE HFA 108 (90 BASE) MCG/ACT IN AERS
2.0000 | INHALATION_SPRAY | RESPIRATORY_TRACT | Status: DC | PRN
  Filled 2021-10-05: qty 6.7

## 2021-10-05 NOTE — ED Notes (Signed)
Patient reports "I'm feeling fine."  Has no current complaints.  Updated on plan of care.  Patient is unaware of the reason he is here, but knows he is in the hospital.

## 2021-10-05 NOTE — ED Triage Notes (Signed)
Pt is here for AMS and weakness, pt is confused and states he doesn't know why he is here. Pt has a strong urine smell. Denies pain.  Pt is alert and oriented to self.

## 2021-10-05 NOTE — H&P (Addendum)
History and Physical    Austin Weaver [id number] DOB: Dec 17, 1945 DOA: 10/05/2021  PCP: Lesleigh Noe, MD  Patient coming from: Home   Chief Complaint:  Chief Complaint  Patient presents with   Altered Mental Status   Weakness     HPI:    75 year old male with past medical history of hypertension, previous stroke, hyperlipidemia, vitamin B12 deficiency who presents to [facility] emergency department with generalized weakness and confusion with family.  Patient is a poor historian secondary to suspected encephalopathy therefore the majority the history is been obtained from discussions with the emergency department staff.  I have attempted to contact the family via the listed phone number and have been unsuccessful in reaching them.  Family explains that for the past 1 to 2 days patient has been exhibiting increasing confusion.  Patient has been disoriented and exhibiting associated lethargy and generalized weakness.  Family is also particularly concerned as patient's urine has had a strong foul odor over the same period of time.  Upon further questioning patient is not exhibited any fever, nausea, vomiting, shortness of breath, cough, chest or abdominal pain.  Patient has not undergone any recent travel and has not had any confirmed sick contact with COVID-19 infection.  Due to patient's progressively worsening confusion and weakness the patient was eventually brought into [facility] emergency department for evaluation.  Upon evaluation in the emergency department work-up has revealed that patient's COVID-19 PCR testing has been positive.  Remainder of work-up reveals no evidence of significant electrolyte abnormality or organ dysfunction.  Patient was found to be febrile in the emergency department at 100.4 F.  Due to metabolic encephalopathy secondary to ongoing COVID infection the hospitalist group has been called to assess the patient for admission to the hospital.  Review of Systems:    Review of Systems  Unable to perform ROS: Mental status change   Past Medical History:  Diagnosis Date   Allergy    Fall 09/21/2018   History of transient ischemic attack (TIA) 11/20/2017   History of transient ischemic attack (TIA) 11/20/2017   Hypertension    Old Cerebral infarction ([place]) 09/21/2018   Remote L. thalamic and R. external capsul & corona radiata infarcts   Stroke [facility])     Past Surgical History:  Procedure Laterality Date   laporotomy       reports that he has been smoking cigarettes. He has a 10.00 pack-year smoking history. He has never used smokeless tobacco. He reports that he does not drink alcohol and does not use drugs.  No Known Allergies  Family History  Problem Relation Age of Onset   Hypertension Mother    Diabetes Mother    Stroke Mother    Stroke Father    Stroke Maternal Grandmother    Stroke Maternal Grandfather    Cancer Maternal Grandfather        unknown type   Stroke Paternal Grandmother    Stroke Paternal Grandfather    Kidney failure Son      Prior to Admission medications   Medication Sig Start Date End Date Taking? Authorizing Provider  aspirin 325 MG tablet Take 1 tablet (325 mg total) by mouth daily. Patient not taking: Reported on 10/05/2021 06/01/19   Lesleigh Noe, MD  atorvastatin (LIPITOR) 80 MG tablet Take 1 tablet (80 mg total) by mouth daily. Keep appointment scheduled on 01/11/20 Patient not taking: Reported on 10/05/2021 05/01/21   Lesleigh Noe, MD  lisinopril (ZESTRIL) 10 MG tablet Take 1  tablet (10 mg total) by mouth daily. Patient not taking: Reported on 10/05/2021 05/01/21   [name], MD  vitamin B-12 (CYANOCOBALAMIN) 500 MCG tablet Take 1 tablet (500 mcg total) by mouth daily. Patient not taking: Reported on 10/05/2021 12/01/18   [name], MD    Physical Exam: Vitals:   10/05/21 2216 10/05/21 2223 10/06/21 0037 10/06/21 0147  BP: (!) 192/104  (!) 184/104 (!) 188/99  Pulse: (!) 106  (!) 105  100  Resp: 19  19 18   Temp:  99.5 F (37.5 C) (!) 100.4 F (38 C) (!) 101.2 F (38.4 C)  TempSrc:  Oral Oral Oral  SpO2: 95%  95% 100%  Weight:    72.9 kg  Height:    5\' 8"  (1.727 m)    Constitutional: Patient is lethargic but arousable and oriented x1. Skin: no rashes, no lesions, good skin turgor noted. Eyes: Pupils are equally reactive to light.  No evidence of scleral icterus or conjunctival pallor.  ENMT: Slightly dry mucous membranes noted.  Posterior pharynx clear of any exudate or lesions.   Neck: normal, supple, no masses, no thyromegaly.  No evidence of jugular venous distension.   Respiratory: Scattered rhonchi bilaterally, no wheezing, no crackles. Normal respiratory effort. No accessory muscle use.  Cardiovascular: Regular rate and rhythm, no murmurs / rubs / gallops. No extremity edema. 2+ pedal pulses. No carotid bruits.  Chest:   Nontender without crepitus or deformity.   Back:   Nontender without crepitus or deformity. Abdomen: Abdomen is soft and nontender.  No evidence of intra-abdominal masses.  Positive bowel sounds noted in all quadrants.   Musculoskeletal: No joint deformity upper and lower extremities. Good ROM, no contractures. Normal muscle tone.  Neurologic: Notable tremor at rest.  Patient is not consistently following commands.  Patient does move all 4 extremities spontaneously.  Patient is responsive to verbal and painful stimuli.   Psychiatric: Unable to fully assess due to confusion.  Patient currently does not seem to possess insight as to his current situation.  Labs on Admission: I have personally reviewed following labs and imaging studies -   CBC: Recent Labs  Lab 10/05/21 1826  WBC 6.6  HGB 17.4*  HCT 50.9  MCV 89.0  PLT 149*   Basic Metabolic Panel: Recent Labs  Lab 10/05/21 1826  NA 136  K 4.4  CL 100  CO2 27  GLUCOSE 99  BUN 13  CREATININE 1.18  CALCIUM 9.3   GFR: Estimated Creatinine Clearance: 53.9 mL/min (by C-G formula  based on SCr of 1.18 mg/dL). Liver Function Tests: Recent Labs  Lab 10/05/21 1826  AST 20  ALT 21  ALKPHOS 78  BILITOT 0.8  PROT 8.3*  ALBUMIN 4.3   No results for input(s): LIPASE, AMYLASE in the last 168 hours. No results for input(s): AMMONIA in the last 168 hours. Coagulation Profile: No results for input(s): INR, PROTIME in the last 168 hours. Cardiac Enzymes: No results for input(s): CKTOTAL, CKMB, CKMBINDEX, TROPONINI in the last 168 hours. BNP (last 3 results) No results for input(s): PROBNP in the last 8760 hours. HbA1C: No results for input(s): HGBA1C in the last 72 hours. CBG: Recent Labs  Lab 10/05/21 1815  GLUCAP 99   Lipid Profile: No results for input(s): CHOL, HDL, LDLCALC, TRIG, CHOLHDL, LDLDIRECT in the last 72 hours. Thyroid Function Tests: Recent Labs    10/05/21 2026  TSH 0.480   Anemia Panel: Recent Labs    10/05/21 2026  FOLATE  5.7*   Urine analysis:    Component Value Date/Time   COLORURINE YELLOW (A) 10/06/2021 0041   APPEARANCEUR CLEAR (A) 10/06/2021 0041   LABSPEC 1.012 10/06/2021 0041   PHURINE 9.0 (H) 10/06/2021 0041   GLUCOSEU NEGATIVE 10/06/2021 0041   HGBUR NEGATIVE 10/06/2021 0041   BILIRUBINUR NEGATIVE 10/06/2021 0041   KETONESUR NEGATIVE 10/06/2021 0041   PROTEINUR NEGATIVE 10/06/2021 0041   NITRITE NEGATIVE 10/06/2021 0041   LEUKOCYTESUR NEGATIVE 10/06/2021 0041    Radiological Exams on Admission - Personally Reviewed: DG Chest 2 View  Result Date: 10/05/2021 CLINICAL DATA:  Altered mental status.  Weakness. EXAM: CHEST - 2 VIEW COMPARISON:  11/18/2018. FINDINGS: Mild enlargement of the cardiac silhouette. No mediastinal or hilar masses. No adenopathy. Prominent bronchovascular markings.  No lung consolidation or edema. No pleural effusion or pneumothorax. Skeletal structures are intact. IMPRESSION: No active cardiopulmonary disease. Electronically Signed   By: Lajean Manes M.D.   On: 10/05/2021 19:17   CT HEAD WO  CONTRAST (5MM)  Result Date: 10/05/2021 CLINICAL DATA:  Altered mental status and weakness. EXAM: CT HEAD WITHOUT CONTRAST TECHNIQUE: Contiguous axial images were obtained from the base of the skull through the vertex without intravenous contrast. COMPARISON:  November 18, 2018 FINDINGS: Brain: There is mild cerebral atrophy with widening of the extra-axial spaces and ventricular dilatation. There are areas of decreased attenuation within the white matter tracts of the supratentorial brain, consistent with microvascular disease changes. Vascular: Extensive calcification of the branches of the carotid arteries is noted. Skull: Normal. Negative for fracture or focal lesion. Sinuses/Orbits: No acute finding. Other: None. IMPRESSION: 1. Generalized cerebral atrophy. 2. No acute intracranial abnormality. Electronically Signed   By: Virgina Norfolk M.D.   On: 10/05/2021 23:51    EKG: Personally reviewed.  Rhythm is Sinus tachycardia with heart rate of 100 bpm.  Evidence of T wave inversions in the lateral leads that is seemingly unchanged compared to prior EKGs.  No dynamic ST segment changes appreciated.  Assessment/Plan  * COVID-19 virus infection- (present on admission) Patient found to be positive for COVID-19 via PCR testing While patient is not hypoxic patient is actively coughing on examination Chest x-ray reveals no evidence of infiltrates COVID-19 infection felt to be the cause of patient's encephalopathy Airborne and contact isolation initiated. Providing patient with supplemental oxygen for bouts of hypoxia with target oxygen saturation of 94%. Patient has been initiated on a 5-day course of the remdesivir Systemic steroids not indicated at this time in the absence of hypoxia As needed bronchodilator therapy for shortness of breath and wheezing  As needed antitussives for cough  Hydrating patient gently with intravenous isotonic fluids  Following CRP, D-dimer,  CBC daily Close clinical  monitoring     Acute metabolic encephalopathy- (present on admission) Patient is exhibiting rapidly progressive lethargy and confusion, over approximately the last 2 days CT head unremarkable, urinalysis unremarkable.  No significant electrolyte abnormality. Most likely etiology for patient's confusion at this time is active COVID-19 infection Additionally obtaining TSH, vitamin B12, ammonia Initiating treatment for COVID-19 with intravenous remdesivir due to tenuous ability to safely take p.o. intake Hydrating patient gently with intravenous isotonic fluids  Hypertensive urgency- (present on admission) Patient exhibiting markedly elevated blood pressures as high as 192/104 Resuming home regimen of lisinopril on medication reconciliation  I suspect that lisinopril monotherapy will not be adequate in appropriately controlling patient's hypertension.  Providing patient with as needed intravenous hydralazine for markedly elevated blood pressures.  We will additionally provide  patient with intravenous Vasotec if necessary  Mixed hyperlipidemia- (present on admission) Continuing home regimen of lipid lowering therapy.   Nicotine dependence, cigarettes, uncomplicated- (present on admission) Once patient mentation improves will counsel on smoking cessation.       Code Status:  Full code  code status decision unable to be confirmed with anyone as patient is currently encephalopathic and there is no family available. Family Communication: I have attempted to contact the daughter via the listed phone number and have been unsuccessful  Status is: Observation  The patient remains OBS appropriate and will d/c before 2 midnights.       Vernelle Emerald MD [facility] Pager [phone]  If 7PM-7AM, please contact night-coverage [email or web address] Use universal [place] password for that web site. If you do not have the password, please call the hospital operator.  10/06/2021,  2:26 AM

## 2021-10-05 NOTE — ED Provider Notes (Signed)
Emergency Medicine Provider Triage Evaluation Note  Austin Weaver , a 75 y.o. male  was evaluated in triage.  Pt complains of altered mental status.  Review of Systems  Positive: Altered mental status, fever Negative: Review of systems unable to obtain due to altered mental status  Physical Exam  BP (!) 183/104 (BP Location: Left Arm)    Pulse 98    Temp (!) 100.4 F (38 C) (Oral)    Resp 20    Ht 5\' 5"  (1.651 m)    Wt 54.4 kg    SpO2 98%    BMI 19.97 kg/m  Gen:   Awake, no distress   Resp:  Normal effort  MSK:   Moves extremities without difficulty  Other:    Medical Decision Making  Medically screening exam initiated at 6:20 PM.  Appropriate orders placed.  Dietrick Barris was informed that the remainder of the evaluation will be completed by another provider, this initial triage assessment does not replace that evaluation, and the importance of remaining in the ED until their evaluation is complete.  Septic work-up ordered   [name], PA-C 10/05/21 1821    10/07/21, MD 10/11/21 [phone]

## 2021-10-05 NOTE — ED Provider Notes (Signed)
[facility] Emergency Department Provider Note   ____________________________________________   Event Date/Time   First MD Initiated Contact with Patient 10/05/21 1949     (approximate)  I have reviewed the triage vital signs and the nursing notes.   HISTORY  Chief Complaint Altered Mental Status and Weakness    HPI Austin Weaver is a 75 y.o. male who presents with his daughter complaining of patient having altered mental status for the last 24 hours.  Patient states that he does not know why he is here and has been in the normal state of health.  Patient's family is unfortunately not at bedside during my evaluation however from nursing triage notes patient has been confused and had a strong urine smell.  At this time patient is only oriented to self           Past Medical History:  Diagnosis Date   Allergy    Fall 09/21/2018   History of transient ischemic attack (TIA) 11/20/2017   History of transient ischemic attack (TIA) 11/20/2017   Hypertension    Old Cerebral infarction (H[facility]) 09/21/2018   Remote L. thalamic and R. external capsul & corona radiata infarcts   Stroke [facility])     Patient Active Problem List   Diagnosis Date Noted   COVID-19 virus infection 10/05/2021   Left-sided weakness 05/01/2021   Lack of access to transportation 05/01/2021   Hyperlipidemia 01/11/2020   Dry mouth 01/31/2019   Allergies 12/01/2018   Essential hypertension 12/01/2018   Food insecurity 12/01/2018   Grief reaction with prolonged bereavement 12/01/2018   Alzheimer's dementia with behavioral disturbance (HCC)    Vitamin B12 deficiency 09/22/2018   History of stroke 09/22/2018   Abnormal CXR 09/21/2018   Old Cerebral infarction (HCC) 09/21/2018   Weakness     Past Surgical History:  Procedure Laterality Date   laporotomy      Prior to Admission medications   Medication Sig Start Date End Date Taking? Authorizing Provider  aspirin 325 MG  tablet Take 1 tablet (325 mg total) by mouth daily. Patient not taking: Reported on 10/05/2021 06/01/19   [name], MD  atorvastatin (LIPITOR) 80 MG tablet Take 1 tablet (80 mg total) by mouth daily. Keep appointment scheduled on 01/11/20 Patient not taking: Reported on 10/05/2021 05/01/21   [name], MD  lisinopril (ZESTRIL) 10 MG tablet Take 1 tablet (10 mg total) by mouth daily. Patient not taking: Reported on 10/05/2021 05/01/21   [name], MD  vitamin B-12 (CYANOCOBALAMIN) 500 MCG tablet Take 1 tablet (500 mcg total) by mouth daily. Patient not taking: Reported on 10/05/2021 12/01/18   [name], MD    Allergies Patient has no known allergies.  Family History  Problem Relation Age of Onset   Hypertension Mother    Diabetes Mother    Stroke Mother    Stroke Father    Stroke Maternal Grandmother    Stroke Maternal Grandfather    Cancer Maternal Grandfather        unknown type   Stroke Paternal Grandmother    Stroke Paternal Grandfather    Kidney failure Son     Social History Social History   Tobacco Use   Smoking status: Every Day    Packs/day: 0.50    Years: 20.00    Pack years: 10.00    Types: Cigarettes   Smokeless tobacco: Never  Vaping Use   Vaping Use: Never used  Substance Use Topics  Alcohol use: No   Drug use: Never    Review of Systems Unable to assess secondary to mental status ____________________________________________   PHYSICAL EXAM:  VITAL SIGNS: ED Triage Vitals  Enc Vitals Group     BP 10/05/21 1753 (!) 183/104     Pulse Rate 10/05/21 1753 98     Resp 10/05/21 1753 20     Temp 10/05/21 1753 (!) 100.4 F (38 C)     Temp Source 10/05/21 1753 Oral     SpO2 10/05/21 1753 98 %     Weight 10/05/21 1758 120 lb (54.4 kg)     Height 10/05/21 1758 5\' 5"  (1.651 m)     Head Circumference --      Peak Flow --      Pain Score 10/05/21 1802 0     Pain Loc --      Pain Edu? --      Excl. in GC? --    Constitutional:  Alert and oriented only to self. Well appearing and in no acute distress. Eyes: Conjunctivae are injected. PERRL. Head: Atraumatic. Nose: No congestion/rhinnorhea. Mouth/Throat: Mucous membranes are moist. Neck: No stridor Cardiovascular: Grossly normal heart sounds.  Good peripheral circulation. Respiratory: Normal respiratory effort.  No retractions. Gastrointestinal: Soft and nontender. No distention. Musculoskeletal: No obvious deformities Neurologic: Intermittently stuttering speech and language. No gross focal neurologic deficits are appreciated. Skin:  Skin is warm and dry. No rash noted. Psychiatric: Cooperative  ____________________________________________   LABS (all labs ordered are listed, but only abnormal results are displayed)  Labs Reviewed  RESP PANEL BY RT-PCR (FLU A&B, COVID) ARPGX2 - Abnormal; Notable for the following components:      Result Value   SARS Coronavirus 2 by RT PCR POSITIVE (*)    All other components within normal limits  COMPREHENSIVE METABOLIC PANEL - Abnormal; Notable for the following components:   Total Protein 8.3 (*)    All other components within normal limits  CBC - Abnormal; Notable for the following components:   Hemoglobin 17.4 (*)    Platelets 149 (*)    All other components within normal limits  LACTIC ACID, PLASMA  URINALYSIS, COMPLETE (UACMP) WITH MICROSCOPIC  CBG MONITORING, ED   ____________________________________________  EKG  ED ECG REPORT I, 10/07/21, the attending physician, personally viewed and interpreted this ECG.  Date: 10/05/2021 EKG Time: 1811 Rate: 100 Rhythm: Tachycardic sinus rhythm with supraventricular complexes QRS Axis: normal Intervals: normal ST/T Wave abnormalities: normal Narrative Interpretation: no evidence of acute ischemia  ____________________________________________  RADIOLOGY  ED MD interpretation: 2 view chest x-ray shows no evidence of acute abnormalities including no  pneumonia, pneumothorax, or widened mediastinum  Official radiology report(s): DG Chest 2 View  Result Date: 10/05/2021 CLINICAL DATA:  Altered mental status.  Weakness. EXAM: CHEST - 2 VIEW COMPARISON:  11/18/2018. FINDINGS: Mild enlargement of the cardiac silhouette. No mediastinal or hilar masses. No adenopathy. Prominent bronchovascular markings.  No lung consolidation or edema. No pleural effusion or pneumothorax. Skeletal structures are intact. IMPRESSION: No active cardiopulmonary disease. Electronically Signed   By: 11/20/2018 M.D.   On: 10/05/2021 19:17    ____________________________________________   PROCEDURES  Procedure(s) performed (including Critical Care):  .1-3 Lead EKG Interpretation Performed by: 10/07/2021, MD Authorized by: Merwyn Katos, MD     Interpretation: normal     ECG rate:  94   ECG rate assessment: normal     Rhythm: sinus rhythm     Ectopy:  none     Conduction: normal     ____________________________________________   INITIAL IMPRESSION / ASSESSMENT AND PLAN / ED COURSE  As part of my medical decision making, I reviewed the following data within the electronic medical record, if available:  Nursing notes reviewed and incorporated, Labs reviewed, EKG interpreted, Old chart reviewed, Radiograph reviewed and Notes from prior ED visits reviewed and incorporated    Presentation for altered mental status.  Patient has tested positive for COVID-19. At this time patient is altered with confusion about why he is here and only oriented to self  Given History and Exam I have a lower suspicion for: Emergent CardioPulmonary causes [such as Acute Asthma or COPD Exacerbation, acute Heart Failure or exacerbation, PE, PTX, atypical ACS, PNA]. Emergent Otolaryngeal causes [such as PTA, RPA, Ludwigs, Epiglottitis, EBV].  Regarding Emergent Travel or Immunosuppressive related infectious: I have a low suspicion for acute HIV.  Given continued altered  mental status and need for further evaluation and management, patient will require admission      ____________________________________________   FINAL CLINICAL IMPRESSION(S) / ED DIAGNOSES  Final diagnoses:  Encephalopathy acute  COVID-19 virus infection     ED Discharge Orders     None        Note:  This document was prepared using Dragon voice recognition software and may include unintentional dictation errors.    Merwyn Katos, MD 10/05/21 [phone]

## 2021-10-06 ENCOUNTER — Observation Stay: Payer: Medicare HMO

## 2021-10-06 DIAGNOSIS — G9340 Encephalopathy, unspecified: Secondary | ICD-10-CM | POA: Diagnosis present

## 2021-10-06 DIAGNOSIS — I10 Essential (primary) hypertension: Secondary | ICD-10-CM | POA: Diagnosis present

## 2021-10-06 DIAGNOSIS — R0902 Hypoxemia: Secondary | ICD-10-CM | POA: Diagnosis present

## 2021-10-06 DIAGNOSIS — F02818 Dementia in other diseases classified elsewhere, unspecified severity, with other behavioral disturbance: Secondary | ICD-10-CM | POA: Diagnosis present

## 2021-10-06 DIAGNOSIS — G9389 Other specified disorders of brain: Secondary | ICD-10-CM | POA: Diagnosis not present

## 2021-10-06 DIAGNOSIS — F17210 Nicotine dependence, cigarettes, uncomplicated: Secondary | ICD-10-CM | POA: Diagnosis present

## 2021-10-06 DIAGNOSIS — Z7982 Long term (current) use of aspirin: Secondary | ICD-10-CM | POA: Diagnosis not present

## 2021-10-06 DIAGNOSIS — I160 Hypertensive urgency: Secondary | ICD-10-CM | POA: Diagnosis present

## 2021-10-06 DIAGNOSIS — I69354 Hemiplegia and hemiparesis following cerebral infarction affecting left non-dominant side: Secondary | ICD-10-CM | POA: Diagnosis not present

## 2021-10-06 DIAGNOSIS — Z79899 Other long term (current) drug therapy: Secondary | ICD-10-CM | POA: Diagnosis not present

## 2021-10-06 DIAGNOSIS — Z8249 Family history of ischemic heart disease and other diseases of the circulatory system: Secondary | ICD-10-CM | POA: Diagnosis not present

## 2021-10-06 DIAGNOSIS — U071 COVID-19: Secondary | ICD-10-CM | POA: Diagnosis present

## 2021-10-06 DIAGNOSIS — Z7401 Bed confinement status: Secondary | ICD-10-CM | POA: Diagnosis not present

## 2021-10-06 DIAGNOSIS — E782 Mixed hyperlipidemia: Secondary | ICD-10-CM | POA: Diagnosis present

## 2021-10-06 DIAGNOSIS — G9341 Metabolic encephalopathy: Secondary | ICD-10-CM | POA: Diagnosis present

## 2021-10-06 DIAGNOSIS — G309 Alzheimer's disease, unspecified: Secondary | ICD-10-CM | POA: Diagnosis present

## 2021-10-06 DIAGNOSIS — D72819 Decreased white blood cell count, unspecified: Secondary | ICD-10-CM | POA: Diagnosis present

## 2021-10-06 DIAGNOSIS — E538 Deficiency of other specified B group vitamins: Secondary | ICD-10-CM | POA: Diagnosis present

## 2021-10-06 LAB — TSH: TSH: 0.48 u[IU]/mL (ref 0.350–4.500)

## 2021-10-06 LAB — CBC WITH DIFFERENTIAL/PLATELET
Abs Immature Granulocytes: 0.02 10*3/uL (ref 0.00–0.07)
Basophils Absolute: 0 10*3/uL (ref 0.0–0.1)
Basophils Relative: 0 %
Eosinophils Absolute: 0 10*3/uL (ref 0.0–0.5)
Eosinophils Relative: 0 %
HCT: 47.8 % (ref 39.0–52.0)
Hemoglobin: 16.5 g/dL (ref 13.0–17.0)
Immature Granulocytes: 0 %
Lymphocytes Relative: 4 %
Lymphs Abs: 0.2 10*3/uL — ABNORMAL LOW (ref 0.7–4.0)
MCH: 30.1 pg (ref 26.0–34.0)
MCHC: 34.5 g/dL (ref 30.0–36.0)
MCV: 87.2 fL (ref 80.0–100.0)
Monocytes Absolute: 1.2 10*3/uL — ABNORMAL HIGH (ref 0.1–1.0)
Monocytes Relative: 19 %
Neutro Abs: 4.6 10*3/uL (ref 1.7–7.7)
Neutrophils Relative %: 77 %
Platelets: 139 10*3/uL — ABNORMAL LOW (ref 150–400)
RBC: 5.48 MIL/uL (ref 4.22–5.81)
RDW: 14.6 % (ref 11.5–15.5)
WBC: 6.1 10*3/uL (ref 4.0–10.5)
nRBC: 0 % (ref 0.0–0.2)

## 2021-10-06 LAB — AMMONIA: Ammonia: 16 umol/L (ref 9–35)

## 2021-10-06 LAB — MAGNESIUM: Magnesium: 1.9 mg/dL (ref 1.7–2.4)

## 2021-10-06 LAB — URINALYSIS, COMPLETE (UACMP) WITH MICROSCOPIC
Bacteria, UA: NONE SEEN
Bilirubin Urine: NEGATIVE
Glucose, UA: NEGATIVE mg/dL
Hgb urine dipstick: NEGATIVE
Ketones, ur: NEGATIVE mg/dL
Leukocytes,Ua: NEGATIVE
Nitrite: NEGATIVE
Protein, ur: NEGATIVE mg/dL
Specific Gravity, Urine: 1.012 (ref 1.005–1.030)
pH: 9 — ABNORMAL HIGH (ref 5.0–8.0)

## 2021-10-06 LAB — D-DIMER, QUANTITATIVE: D-Dimer, Quant: 0.38 ug/mL-FEU (ref 0.00–0.50)

## 2021-10-06 LAB — COMPREHENSIVE METABOLIC PANEL
ALT: 21 U/L (ref 0–44)
AST: 26 U/L (ref 15–41)
Albumin: 4 g/dL (ref 3.5–5.0)
Alkaline Phosphatase: 63 U/L (ref 38–126)
Anion gap: 11 (ref 5–15)
BUN: 11 mg/dL (ref 8–23)
CO2: 23 mmol/L (ref 22–32)
Calcium: 9.1 mg/dL (ref 8.9–10.3)
Chloride: 99 mmol/L (ref 98–111)
Creatinine, Ser: 1.16 mg/dL (ref 0.61–1.24)
GFR, Estimated: >60 mL/min (ref >60)
Glucose, Bld: 96 mg/dL (ref 70–99)
Potassium: 3.9 mmol/L (ref 3.5–5.1)
Sodium: 133 mmol/L — ABNORMAL LOW (ref 135–145)
Total Bilirubin: 0.8 mg/dL (ref 0.3–1.2)
Total Protein: 7.8 g/dL (ref 6.5–8.1)

## 2021-10-06 LAB — VITAMIN B12: Vitamin B-12: 217 pg/mL (ref 180–914)

## 2021-10-06 LAB — SEDIMENTATION RATE: Sed Rate: 4 mm/hr (ref 0–20)

## 2021-10-06 LAB — GLUCOSE, CAPILLARY
Glucose-Capillary: 87 mg/dL (ref 70–99)
Glucose-Capillary: 99 mg/dL (ref 70–99)
Glucose-Capillary: 96 mg/dL (ref 70–99)

## 2021-10-06 LAB — C-REACTIVE PROTEIN: CRP: 1.5 mg/dL — ABNORMAL HIGH (ref <1.0)

## 2021-10-06 LAB — FOLATE: Folate: 5.7 ng/mL — ABNORMAL LOW (ref >5.9)

## 2021-10-06 MED ORDER — LISINOPRIL 20 MG PO TABS
20.0000 mg | ORAL_TABLET | Freq: Every day | ORAL | Status: DC
  Administered 2021-10-06 – 2021-10-10 (×5): 20 mg via ORAL
  Filled 2021-10-06 (×5): qty 1

## 2021-10-06 MED ORDER — ENALAPRILAT 1.25 MG/ML IV SOLN
1.2500 mg | Freq: Once | INTRAVENOUS | Status: AC
  Administered 2021-10-06: 1.25 mg via INTRAVENOUS
  Filled 2021-10-06: qty 1

## 2021-10-06 MED ORDER — SODIUM CHLORIDE 0.9 % IV SOLN
100.0000 mg | Freq: Every day | INTRAVENOUS | Status: AC
  Administered 2021-10-07 – 2021-10-10 (×4): 100 mg via INTRAVENOUS
  Filled 2021-10-06: qty 20
  Filled 2021-10-06 (×3): qty 100

## 2021-10-06 MED ORDER — SODIUM CHLORIDE 0.9 % IV SOLN
200.0000 mg | Freq: Once | INTRAVENOUS | Status: AC
  Administered 2021-10-06: 05:00:00 200 mg via INTRAVENOUS
  Filled 2021-10-06: qty 40

## 2021-10-06 MED ORDER — KETOROLAC TROMETHAMINE 15 MG/ML IJ SOLN
15.0000 mg | Freq: Once | INTRAMUSCULAR | Status: AC
  Administered 2021-10-06: 03:00:00 15 mg via INTRAVENOUS
  Filled 2021-10-06: qty 1

## 2021-10-06 NOTE — Assessment & Plan Note (Signed)
.   Continuing home regimen of lipid lowering therapy.  

## 2021-10-06 NOTE — Progress Notes (Signed)
Pt with acute confusion and expressive aphagia. Portions of admission profile that could be obtained have been documented.

## 2021-10-06 NOTE — Progress Notes (Signed)
PROGRESS NOTE    Nai Dasch  ZOX:[id number] DOB: 1946/09/14 DOA: 10/05/2021 PCP: [name], MD   Brief Narrative: Taken from H&P. 76 year old male with past medical history of hypertension, previous stroke, hyperlipidemia, vitamin B12 deficiency who presents to [facility] emergency department with generalized weakness and confusion with family. Patient was febrile on arrival and found to be positive for COVID-19 infection. Chest x-ray without any evidence of infiltrate.  He was started on remdesivir Saturating well on room air.  Subjective: Patient was seen and examined today.  He was not talking.  Unknown baseline.  Assessment & Plan:   Principal Problem:   COVID-19 virus infection Active Problems:   Hypertensive urgency   Acute metabolic encephalopathy   Mixed hyperlipidemia   Nicotine dependence, cigarettes, uncomplicated  Acute metabolic encephalopathy with COVID-19 infection.  No radiologic evidence of pneumonia. Labs pretty much unremarkable. -Continue with remdesivir-day 1 -No need for steroids at this time -Continue with supportive care and supplement.  Hypertension.  Blood pressure still elevated.  Received Vasotec in ED -Increase the dose of home lisinopril from 10 to 20 mg daily. -Continue to monitor  Hyperlipidemia. -Continue with home statin  Nicotine dependence. -Nicotine patch as needed  Objective: Vitals:   10/06/21 0853 10/06/21 1324 10/06/21 1545 10/06/21 1653  BP: (!) 173/96 (!) 163/84  (!) 153/74  Pulse: 96 97  82  Resp: 20 20 18 15   Temp: 99.9 F (37.7 C) (!) 102.9 F (39.4 C) 98.6 F (37 C) 99.1 F (37.3 C)  TempSrc: Oral Oral Oral Oral  SpO2: 98% 97%  95%  Weight:      Height:        Intake/Output Summary (Last 24 hours) at 10/06/2021 1710 Last data filed at 10/06/2021 0524 Gross per 24 hour  Intake 881.41 ml  Output --  Net 881.41 ml   Filed Weights   10/05/21 1758 10/06/21 0147  Weight: 54.4 kg 72.9 kg     Examination:  General exam: Appears calm and comfortable  Respiratory system: Clear to auscultation. Respiratory effort normal. Cardiovascular system: Tachycardia Gastrointestinal system: Soft, nontender, nondistended, bowel sounds positive. Central nervous system: Alert, not talking, following some commands. Extremities: No edema, no cyanosis, pulses intact and symmetrical. Psychiatry: Judgement and insight appear impaired   DVT prophylaxis: Lovenox Code Status: Full Family Communication: Unable to reach family on phone Disposition Plan:  Status is: Inpatient  Remains inpatient appropriate because: Severity of illness   Level of care: Telemetry Medical  All the records are reviewed and case discussed with Care Management/Social Worker. Management plans discussed with the patient, nursing and they are in agreement.  Consultants:  None  Procedures:  Antimicrobials:   Data Reviewed: I have personally reviewed following labs and imaging studies  CBC: Recent Labs  Lab 10/05/21 1826 10/06/21 0707  WBC 6.6 6.1  NEUTROABS  --  4.6  HGB 17.4* 16.5  HCT 50.9 47.8  MCV 89.0 87.2  PLT 149* 139*   Basic Metabolic Panel: Recent Labs  Lab 10/05/21 1826 10/06/21 0707  NA 136 133*  K 4.4 3.9  CL 100 99  CO2 27 23  GLUCOSE 99 96  BUN 13 11  CREATININE 1.18 1.16  CALCIUM 9.3 9.1  MG  --  1.9   GFR: Estimated Creatinine Clearance: 54.9 mL/min (by C-G formula based on SCr of 1.16 mg/dL). Liver Function Tests: Recent Labs  Lab 10/05/21 1826 10/06/21 0707  AST 20 26  ALT 21 21  ALKPHOS 78 63  BILITOT  0.8 0.8  PROT 8.3* 7.8  ALBUMIN 4.3 4.0   No results for input(s): LIPASE, AMYLASE in the last 168 hours. Recent Labs  Lab 10/06/21 0153  AMMONIA 16   Coagulation Profile: No results for input(s): INR, PROTIME in the last 168 hours. Cardiac Enzymes: No results for input(s): CKTOTAL, CKMB, CKMBINDEX, TROPONINI in the last 168 hours. BNP (last 3  results) No results for input(s): PROBNP in the last 8760 hours. HbA1C: No results for input(s): HGBA1C in the last 72 hours. CBG: Recent Labs  Lab 10/05/21 1815 10/06/21 0850 10/06/21 1322 10/06/21 1654  GLUCAP 99 87 99 96   Lipid Profile: No results for input(s): CHOL, HDL, LDLCALC, TRIG, CHOLHDL, LDLDIRECT in the last 72 hours. Thyroid Function Tests: Recent Labs    10/05/21 2026  TSH 0.480   Anemia Panel: Recent Labs    10/05/21 2026 10/06/21 0153  VITAMINB12  --  217  FOLATE 5.7*  --    Sepsis Labs: Recent Labs  Lab 10/05/21 1826  LATICACIDVEN 1.7    Recent Results (from the past 240 hour(s))  Resp Panel by RT-PCR (Flu A&B, Covid) Nasopharyngeal Swab     Status: Abnormal   Collection Time: 10/05/21  6:04 PM   Specimen: Nasopharyngeal Swab; Nasopharyngeal(NP) swabs in vial transport medium  Result Value Ref Range Status   SARS Coronavirus 2 by RT PCR POSITIVE (A) NEGATIVE Final    Comment: (NOTE) SARS-CoV-2 target nucleic acids are DETECTED.  The SARS-CoV-2 RNA is generally detectable in upper respiratory specimens during the acute phase of infection. Positive results are indicative of the presence of the identified virus, but do not rule out bacterial infection or co-infection with other pathogens not detected by the test. Clinical correlation with patient history and other diagnostic information is necessary to determine patient infection status. The expected result is Negative.  Fact Sheet for Patients: BloggerCourse.comhttps://www.fda.gov/media/152166/download  Fact Sheet for Healthcare Providers: SeriousBroker.ithttps://www.fda.gov/media/152162/download  This test is not yet approved or cleared by the [place] FDA and  has been authorized for detection and/or diagnosis of SARS-CoV-2 by FDA under an Emergency Use Authorization (EUA).  This EUA will remain in effect (meaning this test can be used) for the duration of  the COVID-19 declaration under Section 564(b)(1) of the A  ct, 21 U.S.C. section 360bbb-3(b)(1), unless the authorization is terminated or revoked sooner.     Influenza A by PCR NEGATIVE NEGATIVE Final   Influenza B by PCR NEGATIVE NEGATIVE Final    Comment: (NOTE) The Xpert Xpress SARS-CoV-2/FLU/RSV plus assay is intended as an aid in the diagnosis of influenza from Nasopharyngeal swab specimens and should not be used as a sole basis for treatment. Nasal washings and aspirates are unacceptable for Xpert Xpress SARS-CoV-2/FLU/RSV testing.  Fact Sheet for Patients: BloggerCourse.comhttps://www.fda.gov/media/152166/download  Fact Sheet for Healthcare Providers: SeriousBroker.ithttps://www.fda.gov/media/152162/download  This test is not yet approved or cleared by the [place] FDA and has been authorized for detection and/or diagnosis of SARS-CoV-2 by FDA under an Emergency Use Authorization (EUA). This EUA will remain in effect (meaning this test can be used) for the duration of the COVID-19 declaration under Section 564(b)(1) of the Act, 21 U.S.C. section 360bbb-3(b)(1), unless the authorization is terminated or revoked.  Performed at [facility], [address] [place], [place] [id number]   Culture, blood (Routine X 2) w Reflex to ID Panel     Status: None (Preliminary result)   Collection Time: 10/06/21  1:53 AM   Specimen: BLOOD  Result Value  Ref Range Status   Specimen Description BLOOD BLOOD RIGHT HAND  Final   Special Requests   Final    BOTTLES DRAWN AEROBIC AND ANAEROBIC Blood Culture adequate volume   Culture   Final    NO GROWTH < 12 HOURS Performed at [facility], [address] [place], [place] [address]    Report Status PENDING  Incomplete  Culture, blood (Routine X 2) w Reflex to ID Panel     Status: None (Preliminary result)   Collection Time: 10/06/21  1:53 AM   Specimen: BLOOD  Result Value Ref Range Status   Specimen Description BLOOD RIGHT ANTECUBITAL  Final   Special Requests IN PEDIATRIC BOTTLE Blood Culture  adequate volume  Final   Culture   Final    NO GROWTH < 12 HOURS Performed at [facility], [address] [place], [place] [address]    Report Status PENDING  Incomplete     Radiology Studies: DG Chest 2 View  Result Date: 10/05/2021 CLINICAL DATA:  Altered mental status.  Weakness. EXAM: CHEST - 2 VIEW COMPARISON:  11/18/2018. FINDINGS: Mild enlargement of the cardiac silhouette. No mediastinal or hilar masses. No adenopathy. Prominent bronchovascular markings.  No lung consolidation or edema. No pleural effusion or pneumothorax. Skeletal structures are intact. IMPRESSION: No active cardiopulmonary disease. Electronically Signed   By: Amie Portland M.D.   On: 10/05/2021 19:17   CT HEAD WO CONTRAST ( )  Result Date: 10/05/2021 CLINICAL DATA:  Altered mental status and weakness. EXAM: CT HEAD WITHOUT CONTRAST TECHNIQUE: Contiguous axial images were obtained from the base of the skull through the vertex without intravenous contrast. COMPARISON:  November 18, 2018 FINDINGS: Brain: There is mild cerebral atrophy with widening of the extra-axial spaces and ventricular dilatation. There are areas of decreased attenuation within the white matter tracts of the supratentorial brain, consistent with microvascular disease changes. Vascular: Extensive calcification of the branches of the carotid arteries is noted. Skull: Normal. Negative for fracture or focal lesion. Sinuses/Orbits: No acute finding. Other: None. IMPRESSION: 1. Generalized cerebral atrophy. 2. No acute intracranial abnormality. Electronically Signed   By: Aram Candela M.D.   On: 10/05/2021 23:51   MR BRAIN WO CONTRAST  Result Date: 10/06/2021 CLINICAL DATA:  Neuro deficit with acute stroke suspected EXAM: MRI HEAD WITHOUT CONTRAST TECHNIQUE: Multiplanar, multiecho pulse sequences of the brain and surrounding structures were obtained without intravenous contrast. COMPARISON:  Head CT from yesterday.  Brain MRI 09/22/2018  FINDINGS: Brain: No acute infarction, hemorrhage, hydrocephalus, extra-axial collection or mass lesion. Advanced chronic small vessel ischemia with confluent gliosis in the cerebral white matter and pons. Chronic perforator infarct at the right basal ganglia. Cerebral volume loss with ventriculomegaly. Vascular: Absent right vertebral flow void. Skull and upper cervical spine: Negative for marrow lesion. Sinuses/Orbits: Negative IMPRESSION: 1. No acute finding or significant change from 2019 comparison. 2. Advanced chronic small vessel disease. 3. Slow or absent flow in the right vertebral artery. Electronically Signed   By: Tiburcio Pea M.D.   On: 10/06/2021 04:26    Scheduled Meds:  vitamin C  500 mg Oral Daily   atorvastatin  80 mg Oral Daily   enoxaparin (LOVENOX) injection  40 mg Subcutaneous Q24H   lisinopril  20 mg Oral Daily   zinc sulfate  220 mg Oral Daily   Continuous Infusions:  [START ON 10/07/2021] remdesivir 100 mg in NS 100 mL       LOS: 0 days   Time spent: 40  minutes. More than 50% of the time was spent in counseling/coordination of care  Arnetha CourserSumayya Carley Glendenning, MD [facility]  If 7PM-7AM, please contact night-coverage [email or web address]  10/06/2021, 5:10 PM   This record has been created using Conservation officer, historic buildingsDragon voice recognition software. Errors have been sought and corrected,but may not always be located. Such creation errors do not reflect on the standard of care.

## 2021-10-06 NOTE — Progress Notes (Signed)
Remdesivir - Pharmacy Brief Note   O:  CXR: "No active cardiopulmonary disease." SpO2: 95-98% on RA   A/P:  Remdesivir 200 mg IVPB once followed by 100 mg IVPB daily x 4 days.   Renda Rolls, PharmD, [facility] 10/06/2021 1:44 AM

## 2021-10-06 NOTE — Progress Notes (Signed)
Pt taken to MRI. RN remain in MRI with pt. Pt returned to floor telemetry monitor taken off standby. Meds administered. Hols BP medication for MRI results

## 2021-10-06 NOTE — TOC CM/SW Note (Signed)
°  Transition of Care [facility]) Screening Note   Patient Details  Name: Austin Weaver Date of Birth: 1946-04-12   Transition of Care [facility]) CM/SW Contact:    Liliana Cline, LCSW Phone Number: 10/06/2021, 8:38 AM    Transition of Care Department [facility]) has reviewed patient and no TOC needs have been identified at this time. We will continue to monitor patient advancement through interdisciplinary progression rounds. If new patient transition needs arise, please place a TOC consult.

## 2021-10-06 NOTE — Progress Notes (Signed)
PT Cancellation Note  Patient Details Name: Austin Weaver MRN: [id number] DOB: 1945-12-21   Cancelled Treatment:    Reason Eval/Treat Not Completed: Fatigue/lethargy limiting ability to participate;Medical issues which prohibited therapy. Orders received and chart reviewed. Pt very lethargic, unable to keep eyes open, difficulty following 1 step commands. NT entering room for vitals. Found to have temp of 102.9 so PT plan to hold due to vitals and lethargy. RN and MD made aware. Will re-attempt once medically appropriate.   Delphia Grates. Fairly IV, PT, DPT Physical Therapist- [place]  [facility]  10/06/2021, 1:35 PM

## 2021-10-06 NOTE — Assessment & Plan Note (Signed)
Patient is exhibiting rapidly progressive lethargy and confusion, over approximately the last 2 days CT head unremarkable, urinalysis unremarkable.  No significant electrolyte abnormality. Most likely etiology for patient's confusion at this time is active COVID-19 infection Additionally obtaining TSH, vitamin B12, ammonia Initiating treatment for COVID-19 with intravenous remdesivir due to tenuous ability to safely take p.o. intake Hydrating patient gently with intravenous isotonic fluids

## 2021-10-06 NOTE — Assessment & Plan Note (Addendum)
·   Patient exhibiting markedly elevated blood pressures as high as 192/104  Resuming home regimen of lisinopril on medication reconciliation   I suspect that lisinopril monotherapy will not be adequate in appropriately controlling patient's hypertension.   Providing patient with as needed intravenous hydralazine for markedly elevated blood pressures.  We will additionally provide patient with intravenous Vasotec if necessary

## 2021-10-06 NOTE — Assessment & Plan Note (Addendum)
·   Patient found to be positive for COVID-19 via PCR testing  While patient is not hypoxic patient is actively coughing on examination  Chest x-ray reveals no evidence of infiltrates  COVID-19 infection felt to be the cause of patient's encephalopathy  Airborne and contact isolation initiated.  Providing patient with supplemental oxygen for bouts of hypoxia with target oxygen saturation of 94%.  Patient has been initiated on a 5-day course of the remdesivir  Systemic steroids not indicated at this time in the absence of hypoxia  As needed bronchodilator therapy for shortness of breath and wheezing   As needed antitussives for cough   Hydrating patient gently with intravenous isotonic fluids   Following CRP, D-dimer,  CBC daily  Close clinical monitoring

## 2021-10-06 NOTE — Assessment & Plan Note (Signed)
·   Once patient mentation improves will counsel on smoking cessation.

## 2021-10-07 LAB — C-REACTIVE PROTEIN: CRP: 3 mg/dL — ABNORMAL HIGH (ref <1.0)

## 2021-10-07 LAB — D-DIMER, QUANTITATIVE: D-Dimer, Quant: 0.85 ug/mL-FEU — ABNORMAL HIGH (ref 0.00–0.50)

## 2021-10-07 NOTE — Progress Notes (Signed)
Patient unable to feed self breakfast adequately. Patient fed. Patient has no teeth and unable to chew food. Patient noted to have difficulty staying focused and/or awake to appropriately swallow. Patient noted to be pocketing food as well. ST eval pending. No overt signs of aspiration noted.

## 2021-10-07 NOTE — Progress Notes (Signed)
PROGRESS NOTE    Austin Weaver  VZC:[id number] DOB: December 24, 1945 DOA: 10/05/2021 PCP: [name], MD   Brief Narrative: Taken from H&P. 76 year old male with past medical history of hypertension, previous stroke, hyperlipidemia, vitamin B12 deficiency who presents to [facility] emergency department with generalized weakness and confusion with family. Patient was febrile on arrival and found to be positive for COVID-19 infection. Chest x-ray without any evidence of infiltrate.  He was started on remdesivir Saturating well on room air. PT is recommending SNF.  Subjective: Patient was seen and examined today.  He was resting comfortably.  Seems like oriented to self only.  Able to tell his name, not much talk.  Seems like having underlying cognitive impairment.  Assessment & Plan:   Principal Problem:   COVID-19 virus infection Active Problems:   Hypertensive urgency   Acute metabolic encephalopathy   Mixed hyperlipidemia   Nicotine dependence, cigarettes, uncomplicated  Acute metabolic encephalopathy with COVID-19 infection.  No radiologic evidence of pneumonia. Labs pretty much unremarkable. -Continue with remdesivir-day 2 -No need for steroids at this time -Continue with supportive care and supplement. -PT is recommending SNF -TOC consult.  Hypertension.  Blood pressure improved after increasing the dose of lisinopril to 20 mg daily -Continue lisinopril at 20 mg daily. -Continue to monitor  Hyperlipidemia. -Continue with home statin  Nicotine dependence. -Nicotine patch as needed  Objective: Vitals:   10/07/21 0545 10/07/21 0842 10/07/21 1000 10/07/21 1149  BP: (!) 138/95 136/89  136/88  Pulse: 97 97  83  Resp: (!) 24 17  18   Temp: 100.1 F (37.8 C) 100 F (37.8 C) (!) 100.9 F (38.3 C) 99 F (37.2 C)  TempSrc: Oral Oral Oral Oral  SpO2: 100% 95%  98%  Weight:      Height:        Intake/Output Summary (Last 24 hours) at 10/07/2021 1639 Last data filed at  10/07/2021 1352 Gross per 24 hour  Intake 300 ml  Output 350 ml  Net -50 ml    Filed Weights   10/05/21 1758 10/06/21 0147  Weight: 54.4 kg 72.9 kg    Examination:  General.  Well-developed elderly man, in no acute distress. Pulmonary.  Lungs clear bilaterally, normal respiratory effort. CV.  Regular rate and rhythm, no JVD, rub or murmur. Abdomen.  Soft, nontender, nondistended, BS positive. CNS.  Alert and oriented to name only.  No focal neurologic deficit. Extremities.  No edema, no cyanosis, pulses intact and symmetrical. Psychiatry.  Judgment and insight appears impaired   DVT prophylaxis: Lovenox Code Status: Full Family Communication: Talked to the mother of his son. Disposition Plan:  Status is: Inpatient  Remains inpatient appropriate because: Severity of illness   Level of care: Telemetry Medical  All the records are reviewed and case discussed with Care Management/Social Worker. Management plans discussed with the patient, nursing and they are in agreement.  Consultants:  None  Procedures:  Antimicrobials:   Data Reviewed: I have personally reviewed following labs and imaging studies  CBC: Recent Labs  Lab 10/05/21 1826 10/06/21 0707  WBC 6.6 6.1  NEUTROABS  --  4.6  HGB 17.4* 16.5  HCT 50.9 47.8  MCV 89.0 87.2  PLT 149* 139*    Basic Metabolic Panel: Recent Labs  Lab 10/05/21 1826 10/06/21 0707  NA 136 133*  K 4.4 3.9  CL 100 99  CO2 27 23  GLUCOSE 99 96  BUN 13 11  CREATININE 1.18 1.16  CALCIUM 9.3 9.1  MG  --  1.9    GFR: Estimated Creatinine Clearance: 54.9 mL/min (by C-G formula based on SCr of 1.16 mg/dL). Liver Function Tests: Recent Labs  Lab 10/05/21 1826 10/06/21 0707  AST 20 26  ALT 21 21  ALKPHOS 78 63  BILITOT 0.8 0.8  PROT 8.3* 7.8  ALBUMIN 4.3 4.0    No results for input(s): LIPASE, AMYLASE in the last 168 hours. Recent Labs  Lab 10/06/21 0153  AMMONIA 16    Coagulation Profile: No results for  input(s): INR, PROTIME in the last 168 hours. Cardiac Enzymes: No results for input(s): CKTOTAL, CKMB, CKMBINDEX, TROPONINI in the last 168 hours. BNP (last 3 results) No results for input(s): PROBNP in the last 8760 hours. HbA1C: No results for input(s): HGBA1C in the last 72 hours. CBG: Recent Labs  Lab 10/05/21 1815 10/06/21 0850 10/06/21 1322 10/06/21 1654  GLUCAP 99 87 99 96    Lipid Profile: No results for input(s): CHOL, HDL, LDLCALC, TRIG, CHOLHDL, LDLDIRECT in the last 72 hours. Thyroid Function Tests: Recent Labs    10/05/21 2026  TSH 0.480    Anemia Panel: Recent Labs    10/05/21 2026 10/06/21 0153  VITAMINB12  --  217  FOLATE 5.7*  --     Sepsis Labs: Recent Labs  Lab 10/05/21 1826  LATICACIDVEN 1.7     Recent Results (from the past 240 hour(s))  Resp Panel by RT-PCR (Flu A&B, Covid) Nasopharyngeal Swab     Status: Abnormal   Collection Time: 10/05/21  6:04 PM   Specimen: Nasopharyngeal Swab; Nasopharyngeal(NP) swabs in vial transport medium  Result Value Ref Range Status   SARS Coronavirus 2 by RT PCR POSITIVE (A) NEGATIVE Final    Comment: (NOTE) SARS-CoV-2 target nucleic acids are DETECTED.  The SARS-CoV-2 RNA is generally detectable in upper respiratory specimens during the acute phase of infection. Positive results are indicative of the presence of the identified virus, but do not rule out bacterial infection or co-infection with other pathogens not detected by the test. Clinical correlation with patient history and other diagnostic information is necessary to determine patient infection status. The expected result is Negative.  Fact Sheet for Patients: BloggerCourse.com  Fact Sheet for Healthcare Providers: SeriousBroker.it  This test is not yet approved or cleared by the [place] FDA and  has been authorized for detection and/or diagnosis of SARS-CoV-2 by FDA under an Emergency  Use Authorization (EUA).  This EUA will remain in effect (meaning this test can be used) for the duration of  the COVID-19 declaration under Section 564(b)(1) of the A ct, 21 U.S.C. section 360bbb-3(b)(1), unless the authorization is terminated or revoked sooner.     Influenza A by PCR NEGATIVE NEGATIVE Final   Influenza B by PCR NEGATIVE NEGATIVE Final    Comment: (NOTE) The Xpert Xpress SARS-CoV-2/FLU/RSV plus assay is intended as an aid in the diagnosis of influenza from Nasopharyngeal swab specimens and should not be used as a sole basis for treatment. Nasal washings and aspirates are unacceptable for Xpert Xpress SARS-CoV-2/FLU/RSV testing.  Fact Sheet for Patients: BloggerCourse.com  Fact Sheet for Healthcare Providers: SeriousBroker.it  This test is not yet approved or cleared by the [place] FDA and has been authorized for detection and/or diagnosis of SARS-CoV-2 by FDA under an Emergency Use Authorization (EUA). This EUA will remain in effect (meaning this test can be used) for the duration of the COVID-19 declaration under Section 564(b)(1) of the Act, 21 U.S.C. section 360bbb-3(b)(1), unless the authorization is terminated  or revoked.  Performed at [facility], [address] [place], [place] [address]   Culture, blood (Routine X 2) w Reflex to ID Panel     Status: None (Preliminary result)   Collection Time: 10/06/21  1:53 AM   Specimen: BLOOD  Result Value Ref Range Status   Specimen Description BLOOD BLOOD RIGHT HAND  Final   Special Requests   Final    BOTTLES DRAWN AEROBIC AND ANAEROBIC Blood Culture adequate volume   Culture   Final    NO GROWTH < 12 HOURS Performed at [facility], [address] [place], [place] [address]    Report Status PENDING  Incomplete  Culture, blood (Routine X 2) w Reflex to ID Panel     Status: None (Preliminary result)   Collection Time: 10/06/21   1:53 AM   Specimen: BLOOD  Result Value Ref Range Status   Specimen Description BLOOD RIGHT ANTECUBITAL  Final   Special Requests IN PEDIATRIC BOTTLE Blood Culture adequate volume  Final   Culture   Final    NO GROWTH < 12 HOURS Performed at [facility], [address] [place], [place] [address]    Report Status PENDING  Incomplete      Radiology Studies: DG Chest 2 View  Result Date: 10/05/2021 CLINICAL DATA:  Altered mental status.  Weakness. EXAM: CHEST - 2 VIEW COMPARISON:  11/18/2018. FINDINGS: Mild enlargement of the cardiac silhouette. No mediastinal or hilar masses. No adenopathy. Prominent bronchovascular markings.  No lung consolidation or edema. No pleural effusion or pneumothorax. Skeletal structures are intact. IMPRESSION: No active cardiopulmonary disease. Electronically Signed   By: Amie Portlandavid  Ormond M.D.   On: 10/05/2021 19:17   CT HEAD WO CONTRAST (5MM)  Result Date: 10/05/2021 CLINICAL DATA:  Altered mental status and weakness. EXAM: CT HEAD WITHOUT CONTRAST TECHNIQUE: Contiguous axial images were obtained from the base of the skull through the vertex without intravenous contrast. COMPARISON:  November 18, 2018 FINDINGS: Brain: There is mild cerebral atrophy with widening of the extra-axial spaces and ventricular dilatation. There are areas of decreased attenuation within the white matter tracts of the supratentorial brain, consistent with microvascular disease changes. Vascular: Extensive calcification of the branches of the carotid arteries is noted. Skull: Normal. Negative for fracture or focal lesion. Sinuses/Orbits: No acute finding. Other: None. IMPRESSION: 1. Generalized cerebral atrophy. 2. No acute intracranial abnormality. Electronically Signed   By: Aram Candelahaddeus  Houston M.D.   On: 10/05/2021 23:51   MR BRAIN WO CONTRAST  Result Date: 10/06/2021 CLINICAL DATA:  Neuro deficit with acute stroke suspected EXAM: MRI HEAD WITHOUT CONTRAST TECHNIQUE: Multiplanar,  multiecho pulse sequences of the brain and surrounding structures were obtained without intravenous contrast. COMPARISON:  Head CT from yesterday.  Brain MRI 09/22/2018 FINDINGS: Brain: No acute infarction, hemorrhage, hydrocephalus, extra-axial collection or mass lesion. Advanced chronic small vessel ischemia with confluent gliosis in the cerebral white matter and pons. Chronic perforator infarct at the right basal ganglia. Cerebral volume loss with ventriculomegaly. Vascular: Absent right vertebral flow void. Skull and upper cervical spine: Negative for marrow lesion. Sinuses/Orbits: Negative IMPRESSION: 1. No acute finding or significant change from 2019 comparison. 2. Advanced chronic small vessel disease. 3. Slow or absent flow in the right vertebral artery. Electronically Signed   By: Tiburcio PeaJonathan  Watts M.D.   On: 10/06/2021 04:26    Scheduled Meds:  vitamin C  500 mg Oral Daily   atorvastatin  80 mg Oral Daily   enoxaparin (LOVENOX) injection  40 mg Subcutaneous Q24H   lisinopril  20 mg Oral Daily   zinc sulfate  220 mg Oral Daily   Continuous Infusions:  remdesivir 100 mg in NS 100 mL 100 mg (10/07/21 0853)     LOS: 1 day   Time spent: 38 minutes. More than 50% of the time was spent in counseling/coordination of care  Arnetha CourserSumayya Kaan Tosh, MD [facility]  If 7PM-7AM, please contact night-coverage [email or web address]  10/07/2021, 4:39 PM   This record has been created using Conservation officer, historic buildingsDragon voice recognition software. Errors have been sought and corrected,but may not always be located. Such creation errors do not reflect on the standard of care.

## 2021-10-07 NOTE — Progress Notes (Signed)
Spoke with patient's cousin [name] who helps care for him at home. [name] states patient lives with her mother [name]. Per [name] patient is ambulatory at baseline; however walks hunched over and "drags his feet." He uses both a cane and rollator at times. She stated he does stutter and have some difficulty "getting words out" at baseline.

## 2021-10-07 NOTE — Evaluation (Signed)
Physical Therapy Evaluation Patient Details Name: Austin Weaver MRN: [id number] DOB: 1946-01-30 Today's Date: 10/07/2021  History of Present Illness  76 year old male with past medical history of hypertension, previous stroke, hyperlipidemia, vitamin B12 deficiency who presents to [facility] emergency department with generalized weakness and confusion with family.  Patient was febrile on arrival and found to be positive for COVID-19 infection.   Clinical Impression  Pt admitted with above diagnosis. Pt received upright in bed. Pt very lethargic, only oriented to self, difficulty keeping eyes open, following commands < 25% of the time with requiring frequent reminding to attempt to perform movements. Resting HR in mid to upper 90 BPM. Per family, pt is mod-I with all mobility with SPC/RW, pt stating RW but unreliable historian at this time. TO date, ultimately relies on all bed features and maxA to sit EOB. Heavy post lean relying on max multimodal cues to improve upright posture. Able to maintain static sitting ~ 10 sec with feet support and physical placement of L hand on bed rail with close supervision before displaying posterior lean requiring maxA to return to upright. Pt tolerating ~2-3 minutes of seated before returning to supine with maxA as pt demo'ing worsening postural control as time progresses. Hr elevated to 115 BPM with static sitting EOB. Due to deficits in cognition, functional mobility, strength/postural weakness, recommend STR at d/c. Pt currently with functional limitations due to the deficits listed below (see PT Problem List). Pt will benefit from skilled PT to increase their independence and safety with mobility to allow discharge to the venue listed below.     Recommendations for follow up therapy are one component of a multi-disciplinary discharge planning process, led by the attending physician.  Recommendations may be updated based on patient status, additional functional criteria  and insurance authorization.  Follow Up Recommendations Skilled nursing-short term rehab (<3 hours/day)    Assistance Recommended at Discharge Frequent or constant Supervision/Assistance  Functional Status Assessment Patient has had a recent decline in their functional status and/or demonstrates limited ability to make significant improvements in function in a reasonable and predictable amount of time  Equipment Recommendations  Other (comment) (tbd by next venue of care)    Recommendations for Other Services       Precautions / Restrictions Precautions Precautions: Fall Restrictions Weight Bearing Restrictions: No      Mobility  Bed Mobility Overal bed mobility: Needs Assistance Bed Mobility: Supine to Sit;Sit to Supine     Supine to sit: Max assist;HOB elevated Sit to supine: Max assist     Patient Response: Restless;Flat affect  Transfers                   General transfer comment: inappropraite due to level of alertness and postrual deficits    Ambulation/Gait               General Gait Details: deferred  Stairs            Wheelchair Mobility    Modified Rankin (Stroke Patients Only)       Balance Overall balance assessment: Needs assistance Sitting-balance support: Bilateral upper extremity supported;Feet supported Sitting balance-Leahy Scale: Poor   Postural control: Posterior lean     Standing balance comment: deferred                             Pertinent Vitals/Pain Pain Assessment: Faces Faces Pain Scale: No hurt    Home Living Family/patient expects  to be discharged to:: Private residence Living Arrangements: Other relatives Available Help at Discharge: Family;Available 24 hours/day Type of Home: Apartment Home Access: Level entry       Home Layout: One level Home Equipment: Pharmacist, hospital (2 wheels);Cane - single point;Grab bars - tub/shower;Grab bars - toilet Additional Comments: Info  provided by family member via telephone    Prior Function Prior Level of Function : Independent/Modified Independent                     Hand Dominance        Extremity/Trunk Assessment   Upper Extremity Assessment Upper Extremity Assessment: Generalized weakness    Lower Extremity Assessment Lower Extremity Assessment: Generalized weakness    Cervical / Trunk Assessment Cervical / Trunk Assessment: Normal  Communication      Cognition Arousal/Alertness: Lethargic Behavior During Therapy: Restless Overall Cognitive Status: No family/caregiver present to determine baseline cognitive functioning                                 General Comments: oriented to self only, tremulous, very limited ability to follow 1 step commands (<25%). Difficult to stay awake during attempts at mobility.        General Comments General comments (skin integrity, edema, etc.): HR up to 115 BPM with sitting EOB    Exercises Other Exercises Other Exercises: Role of PT in acute setting, d/c recs   Assessment/Plan    PT Assessment Patient needs continued PT services  PT Problem List Decreased strength;Decreased activity tolerance;Decreased cognition;Decreased balance;Decreased mobility       PT Treatment Interventions DME instruction;Therapeutic exercise;Gait training;Balance training;Neuromuscular re-education;Functional mobility training;Therapeutic activities;Patient/family education    PT Goals (Current goals can be found in the Care Plan section)  Acute Rehab PT Goals PT Goal Formulation: Patient unable to participate in goal setting    Frequency Min 2X/week   Barriers to discharge        Co-evaluation               AM-PAC PT "6 Clicks" Mobility  Outcome Measure Help needed turning from your back to your side while in a flat bed without using bedrails?: Total Help needed moving from lying on your back to sitting on the side of a flat bed without  using bedrails?: Total Help needed moving to and from a bed to a chair (including a wheelchair)?: Total Help needed standing up from a chair using your arms (e.g., wheelchair or bedside chair)?: Total Help needed to walk in hospital room?: Total Help needed climbing 3-5 steps with a railing? : Total 6 Click Score: 6    End of Session   Activity Tolerance: Patient limited by lethargy Patient left: in bed;with call bell/phone within reach;with bed alarm set Nurse Communication: Mobility status PT Visit Diagnosis: Other abnormalities of gait and mobility (R26.89);Muscle weakness (generalized) (M62.81);Difficulty in walking, not elsewhere classified (R26.2)    Time: 2248-2500 PT Time Calculation (min) (ACUTE ONLY): 18 min   Charges:   PT Evaluation $PT Eval Moderate Complexity: 1 Mod        Aunisty Reali M. Fairly IV, PT, DPT Physical Therapist- [place]  [facility]  10/07/2021, 11:00 AM

## 2021-10-07 NOTE — Evaluation (Signed)
Clinical/Bedside Swallow Evaluation Patient Details  Name: Austin Weaver MRN: [id number] Date of Birth: 1946-02-07  Today's Date: 10/07/2021 Time: SLP Start Time (ACUTE ONLY): 1155 SLP Stop Time (ACUTE ONLY): 1255 SLP Time Calculation (min) (ACUTE ONLY): 60 min  Past Medical History:  Past Medical History:  Diagnosis Date   Allergy    Fall 09/21/2018   History of transient ischemic attack (TIA) 11/20/2017   History of transient ischemic attack (TIA) 11/20/2017   Hypertension    Old Cerebral infarction (HCC) 09/21/2018   Remote L. thalamic and R. external capsul & corona radiata infarcts   Stroke [facility](HCC)    Past Surgical History:  Past Surgical History:  Procedure Laterality Date   laporotomy     HPI:  Pt is a 76 year old male with past medical history of hypertension, previous stroke, hyperlipidemia, vitamin B12 deficiency who presents to [facility] emergency department with generalized weakness and confusion with family.  Patient was febrile on arrival and found to be positive for COVID-19 infection.  Chest x-ray without any evidence of infiltrate. He was started on remdesivir.    Pt has Baseline Cognitive decline/Dementia, per MD. Per Family report, pt requires support w/ ADLs in the home.   Assessment / Plan / Recommendation  Clinical Impression  Pt appears to present w/ oral phase dysphagia in light of declined Cognitive status; Baseline Cognitive decline/Dementia, per MD. Any Cognitive decline can impact overall awareness/timing of swallow as well as impact the oral phase (increasing oral holding) during po tasks which increases risk for aspiration, choking. Pt's risk for aspiration/choking is present but can be reduced when following general aspiration precautions, Supervision during oral intake for feeding and cues to clear mouth b/t bites/sips, and by using a modified diet consistency.  She required MOD verbal/tactile/visual cues for during po tasks.       Pt consumed several trials  of ice chips, thin liquids, purees, minced/softened solids w/ No overt clinical s/s of aspiration noted; no immediate coughing, no decline in vocal quality; and no decline in respiratory status during/post trials. Oral phase was c/b prolonged A-P transfer and oral holding w/ all trial consistencies -- much more oral holding w/ liquids and purees; less oral holding noted w/ increased textured foods(minced -- suspect d/t texture increasing stimulation). Pt attempted self-feeding by Holding Cup but required Mod support and guidance to bring the Cup to his mouth d/t Cognitive decline/awareness and overall weakness. [facility] No unilateral weakness noted. Some confusion of OM tasks and oral care.         D/t pt's Baseline, declined Cognitive status w/ Dementia, and risk for aspiration/choking, recommend initiation of the dysphagia level 2(MINCED foods w/ gravies to moisten well) w/ thin liquids; general aspiration precautions; reduce Distractions during meals and engage pt during po's at meal for self-feeding. MUST check for/give cues for oral clearing/swallowing w/ each bite/sip. Pills Crushed in Puree for safer swallowing. Support w/ feeding at meals as needed. MD/NSG updated. ST services recommends follow w/ Palliative Care for GOC and education re: impact of Cognitive decline/Dementia on swallowing. Dietician f/u for support. No further skilled ST sercies indicated at this time. NSG to reconsult if new needs aruse. SLP Visit Diagnosis: Dysphagia, oral phase (R13.11) (baseline Cognitive decline/Dementia per MD)    Aspiration Risk  Mild aspiration risk;Risk for inadequate nutrition/hydration (reduced w/ precautions)    Diet Recommendation   dysphagia level 2(MINCED foods w/ gravies to moisten well) w/ thin liquids; general aspiration precautions; reduce Distractions during meals  and engage pt during po's at meal for self-feeding. MUST check for/give cues for oral clearing/swallowing w/ each  bite/sip. Feeding Support as needed w/ Verbal/Tactile/Visual cues as needed.   Medication Administration: Crushed with puree (for safer swallowing)    Other  Recommendations Recommended Consults:  (Dietician for support; Neurology for f/u w/ Cognitive decline/education) Oral Care Recommendations: Oral care BID;Oral care before and after PO;Staff/trained caregiver to provide oral care Other Recommendations:  (n/a)    Recommendations for follow up therapy are one component of a multi-disciplinary discharge planning process, led by the attending physician.  Recommendations may be updated based on patient status, additional functional criteria and insurance authorization.  Follow up Recommendations No SLP follow up      Assistance Recommended at Discharge Frequent or constant Supervision/Assistance (baseline Cognitive decline/Dementia per MD)  Functional Status Assessment Patient has had a recent decline in their functional status and/or demonstrates limited ability to make significant improvements in function in a reasonable and predictable amount of time  Frequency and Duration  (n/a)   (n/a)       Prognosis Prognosis for Safe Diet Advancement: Fair Barriers to Reach Goals: Cognitive deficits;Time post onset;Severity of deficits (baseline Cognitive decline/Dementia per MD) Barriers/Prognosis Comment: baseline Cognitive decline/Dementia per MD      Swallow Study   General Date of Onset: 10/05/21 HPI: Pt is a 76 year old male with past medical history of hypertension, previous stroke, hyperlipidemia, vitamin B12 deficiency who presents to [facility] emergency department with generalized weakness and confusion with family.  Patient was febrile on arrival and found to be positive for COVID-19 infection.  Chest x-ray without any evidence of infiltrate. He was started on remdesivir.   Pt has Baseline Cognitive decline/Dementia, per MD. Type of Study: Bedside Swallow Evaluation Previous Swallow  Assessment: none Diet Prior to this Study: Dysphagia 1 (puree);Thin liquids Temperature Spikes Noted: No (wbc 6.1) Respiratory Status: Room air History of Recent Intubation: No Behavior/Cognition: Alert;Cooperative;Confused;Pleasant mood;Distractible;Requires cueing (Dysfluency intermittently -- this appears Baseline per Family report to NSG) Oral Cavity Assessment: Within Functional Limits Oral Care Completed by SLP: Recent completion by staff Oral Cavity - Dentition: Edentulous (does not wear per pt) Vision: Functional for self-feeding Self-Feeding Abilities: Needs assist;Needs set up;Total assist (weak UEs) Patient Positioning: Upright in bed (needed positioning support) Baseline Vocal Quality: Normal Volitional Cough: Cognitively unable to elicit Volitional Swallow: Unable to elicit    Oral/Motor/Sensory Function Overall Oral Motor/Sensory Function: Within functional limits   Ice Chips Ice chips: Impaired Presentation: Spoon (fed; 2 trials) Oral Phase Impairments: Poor awareness of bolus Oral Phase Functional Implications: Oral holding Pharyngeal Phase Impairments:  (none) Other Comments: verbal cues   Thin Liquid Thin Liquid: Impaired Presentation: Self Fed;Straw (10 trials) Oral Phase Impairments: Poor awareness of bolus Oral Phase Functional Implications: Prolonged oral transit;Oral holding Pharyngeal  Phase Impairments:  (none) Other Comments: water, juice/[place], ensure -- verbal/visual cues    Nectar Thick Nectar Thick Liquid: Not tested   Honey Thick Honey Thick Liquid: Not tested   Puree Puree: Impaired Presentation: Spoon (fed; 8 trials) Oral Phase Impairments: Poor awareness of bolus Oral Phase Functional Implications: Prolonged oral transit;Oral holding (intermittently) Other Comments: verbal/visual cues   Solid     Solid: Impaired (mastication, edentulous; also Cognitive status) Presentation: Spoon (fed; 5 trials) Oral Phase Impairments: Impaired  mastication (adequate awareness) Oral Phase Functional Implications: Impaired mastication;Prolonged oral transit (min) Pharyngeal Phase Impairments:  (none) Other Comments: moistened well        Jerilynn Som, MS, CCC-SLP  Warehouse manager Rehab Services [phone] Jeanenne Licea 10/07/2021,2:55 PM

## 2021-10-08 LAB — GLUCOSE, CAPILLARY
Glucose-Capillary: 121 mg/dL — ABNORMAL HIGH (ref 70–99)
Glucose-Capillary: 128 mg/dL — ABNORMAL HIGH (ref 70–99)

## 2021-10-08 LAB — D-DIMER, QUANTITATIVE: D-Dimer, Quant: 0.77 ug/mL-FEU — ABNORMAL HIGH (ref 0.00–0.50)

## 2021-10-08 LAB — C-REACTIVE PROTEIN: CRP: 2.9 mg/dL — ABNORMAL HIGH (ref <1.0)

## 2021-10-08 NOTE — Evaluation (Signed)
Occupational Therapy Evaluation Patient Details Name: Austin Weaver MRN: [id number] DOB: 1945-11-14 Today's Date: 10/08/2021   History of Present Illness 76 year old male with past medical history of hypertension, previous stroke, hyperlipidemia, vitamin B12 deficiency who presents to [facility] emergency department with generalized weakness and confusion with family.  Patient was febrile on arrival and found to be positive for COVID-19 infection.   Clinical Impression   Patient presenting with decreased Ind in self care, balance, functional mobility/transfers, endurance, and safety awareness. Patient reports being mod I with use of SPC or RW at baseline and living with a cousin. Pt is oriented to self, location, and month. Pt performed bed mobility with mod A to EOB and static sitting balance with supervision. Pt standing with mod A and then needing mod multimodal cuing for sequencing and initiation to transfer into recliner chair. Pt unable to cognitively determine how to remove hands from under blanket to use call bell so with obvious impairment this session. Patient will benefit from acute OT to increase overall independence in the areas of ADLs, functional mobility, and safety awareness in order to safely discharge to next venue of care.      Recommendations for follow up therapy are one component of a multi-disciplinary discharge planning process, led by the attending physician.  Recommendations may be updated based on patient status, additional functional criteria and insurance authorization.   Follow Up Recommendations  Skilled nursing-short term rehab (<3 hours/day)    Assistance Recommended at Discharge Frequent or constant Supervision/Assistance  Patient can return home with the following Assistance with cooking/housework;A little help with walking and/or transfers;Direct supervision/assist for financial management;A lot of help with bathing/dressing/bathroom;Direct supervision/assist for  medications management    Functional Status Assessment  Patient has had a recent decline in their functional status and demonstrates the ability to make significant improvements in function in a reasonable and predictable amount of time.  Equipment Recommendations  Other (comment) (defer to next venue of care)    Recommendations for Other Services       Precautions / Restrictions Precautions Precautions: Fall      Mobility Bed Mobility Overal bed mobility: Needs Assistance Bed Mobility: Supine to Sit     Supine to sit: Min assist     General bed mobility comments: assist for trunk support and cuing for hand placement    Transfers Overall transfer level: Needs assistance Equipment used: 1 person hand held assist Transfers: Sit to/from Stand;Bed to chair/wheelchair/BSC Sit to Stand: Mod assist     Step pivot transfers: Min assist            Balance Overall balance assessment: Needs assistance Sitting-balance support: Bilateral upper extremity supported;Feet supported Sitting balance-Leahy Scale: Fair Sitting balance - Comments: supervision     Standing balance-Leahy Scale: Poor                             ADL either performed or assessed with clinical judgement   ADL Overall ADL's : Needs assistance/impaired     Grooming: Wash/dry hands;Wash/dry face;Sitting;Supervision/safety;Set up                   Toilet Transfer: Moderate assistance Toilet Transfer Details (indicate cue type and reason): simulated         Functional mobility during ADLs: Minimal assistance;Moderate assistance;Cueing for sequencing;Cueing for safety       Vision Baseline Vision/History: 1 Wears glasses Ability to See in Adequate Light: 0 Adequate Patient  Visual Report: No change from baseline       Perception     Praxis      Pertinent Vitals/Pain Pain Assessment: No/denies pain     Hand Dominance Right   Extremity/Trunk Assessment Upper  Extremity Assessment Upper Extremity Assessment: Generalized weakness   Lower Extremity Assessment Lower Extremity Assessment: Generalized weakness       Communication Communication Communication: Expressive difficulties (functional but stutters at baseline)   Cognition Arousal/Alertness: Awake/alert Behavior During Therapy: WFL for tasks assessed/performed Overall Cognitive Status: No family/caregiver present to determine baseline cognitive functioning                                 General Comments: Pt is oriented to self, location, and month. He needs mod multimodal cuing for sequencing and initiation.                Home Living Family/patient expects to be discharged to:: Private residence Living Arrangements: Other relatives (cousin) Available Help at Discharge: Family;Available 24 hours/day Type of Home: Apartment Home Access: Level entry     Home Layout: One level     Bathroom Shower/Tub: Chief Strategy Officer: Standard     Home Equipment: Pharmacist, hospital (2 wheels);Cane - single point;Grab bars - tub/shower;Grab bars - toilet          Prior Functioning/Environment Prior Level of Function : Independent/Modified Independent                        OT Problem List: Decreased strength;Decreased activity tolerance;Impaired balance (sitting and/or standing);Decreased knowledge of use of DME or AE;Decreased safety awareness;Decreased cognition      OT Treatment/Interventions: Self-care/ADL training;Therapeutic exercise;Therapeutic activities;Energy conservation;Manual therapy;DME and/or AE instruction;Patient/family education;Balance training;Cognitive remediation/compensation    OT Goals(Current goals can be found in the care plan section) Acute Rehab OT Goals Patient Stated Goal: to get better OT Goal Formulation: With patient Time For Goal Achievement: 10/22/21 Potential to Achieve Goals: Fair ADL Goals Pt  Will Perform Grooming: with supervision;standing Pt Will Perform Lower Body Dressing: with supervision;sit to/from stand Pt Will Transfer to Toilet: with supervision;ambulating Pt Will Perform Toileting - Clothing Manipulation and hygiene: with supervision;sit to/from stand  OT Frequency: Min 2X/week       AM-PAC OT "6 Clicks" Daily Activity     Outcome Measure Help from another person eating meals?: A Little Help from another person taking care of personal grooming?: A Little Help from another person toileting, which includes using toliet, bedpan, or urinal?: A Lot Help from another person bathing (including washing, rinsing, drying)?: A Lot Help from another person to put on and taking off regular upper body clothing?: A Little Help from another person to put on and taking off regular lower body clothing?: A Lot 6 Click Score: 15   End of Session Nurse Communication: Mobility status  Activity Tolerance: Patient tolerated treatment well Patient left: in chair;with call bell/phone within reach;with chair alarm set;with nursing/sitter in room  OT Visit Diagnosis: Unsteadiness on feet (R26.81);Muscle weakness (generalized) (M62.81)                Time: 4650-3546 OT Time Calculation (min): 17 min Charges:  OT General Charges $OT Visit: 1 Visit OT Evaluation $OT Eval Moderate Complexity: 1 Mod OT Treatments $Self Care/Home Management : 8-22 mins  Jackquline Denmark, MS, OTR/L , CBIS ascom [phone]  10/08/21, 2:18 PM

## 2021-10-08 NOTE — Progress Notes (Addendum)
SLP Cancellation Note  Patient Details Name: Austin Weaver MRN: [id number] DOB: September 22, 1946   Cancelled treatment:       Reason Eval/Treat Not Completed:  (chart reviewed; consulted NSG staff today re: pt's status and progress w/ po intake, swallowing) NSG staff reported pt exhibiting a more timely oral transfer and swallowing w/ Less oral holding of the current diet consistency and thin liquids. This is good improvement for safe, overall oral intake to meet his needs adequately.  Recommend continue current dysphagia diet w/ thin liquids while admitted and at D/C; general aspiration precautions and feeding support at meals. Recommend Supervision at meals to monitor and/or give verbal cues if any oral holding noted; check for oral clearing after any oral intake d/t Baseline Cognitive decline, Dementia (per MD). This diet consistency would be beneficial for safer oral intake to meet nutritional needs. Pt can have f/u at next venue of care when appropriate. Recommend Dietician f/u for support.       Jerilynn Som, MS, CCC-SLP Speech Language Pathologist Rehab Services [phone] [facility] 10/08/2021, 2:48 PM

## 2021-10-08 NOTE — Progress Notes (Signed)
PROGRESS NOTE    Austin CopaWillie Weaver  VHQ:[id number] DOB: [birth year] DOA: 10/05/2021 PCP: Lynnda Childody, Jessica R, MD   Brief Narrative: Taken from H&P. 76 year old male with past medical history of hypertension, previous stroke, hyperlipidemia, vitamin B12 deficiency who presents to [facility] emergency department with generalized weakness and confusion with family. Patient was febrile on arrival and found to be positive for COVID-19 infection. Chest x-ray without any evidence of infiltrate.  He was started on remdesivir Saturating well on room air. PT is recommending SNF.  Subjective: Patient was more alert and oriented today.  Able to answer questions appropriately.  Assessment & Plan:   Principal Problem:   COVID-19 virus infection Active Problems:   Hypertensive urgency   Acute metabolic encephalopathy   Mixed hyperlipidemia   Nicotine dependence, cigarettes, uncomplicated  Acute metabolic encephalopathy with COVID-19 infection.  No radiologic evidence of pneumonia. Labs pretty much unremarkable. -Continue with remdesivir-day 3 -No need for steroids at this time -Continue with supportive care and supplement. -PT is recommending SNF -TOC consult.  Hypertension.  Blood pressure improved after increasing the dose of lisinopril to 20 mg daily -Continue lisinopril at 20 mg daily. -Continue to monitor  Hyperlipidemia. -Continue with home statin  Nicotine dependence. -Nicotine patch as needed  Objective: Vitals:   10/08/21 0012 10/08/21 0509 10/08/21 0826 10/08/21 1159  BP: (!) 156/87 137/85 138/78 (!) 145/77  Pulse: 73 78 71 83  Resp: (!) 21 18 18 18   Temp: 98 F (36.7 C) 98 F (36.7 C) 98.4 F (36.9 C) 98.3 F (36.8 C)  TempSrc: Oral  Oral Oral  SpO2: 96% 96% 98% 98%  Weight:      Height:        Intake/Output Summary (Last 24 hours) at 10/08/2021 1542 Last data filed at 10/08/2021 0509 Gross per 24 hour  Intake --  Output 350 ml  Net -350 ml    Filed Weights    10/05/21 1758 10/06/21 0147  Weight: 54.4 kg 72.9 kg    Examination:  General.  Well-developed elderly man, in no acute distress. Pulmonary.  Lungs clear bilaterally, normal respiratory effort. CV.  Regular rate and rhythm, no JVD, rub or murmur. Abdomen.  Soft, nontender, nondistended, BS positive. CNS.  Alert and oriented .  No focal neurologic deficit. Extremities.  No edema, no cyanosis, pulses intact and symmetrical. Psychiatry.  Judgment and insight appears normal.    DVT prophylaxis: Lovenox Code Status: Full Family Communication: Talked to the mother of his son. Disposition Plan:  Status is: Inpatient  Remains inpatient appropriate because: Severity of illness   Level of care: Telemetry Medical  All the records are reviewed and case discussed with Care Management/Social Worker. Management plans discussed with the patient, nursing and they are in agreement.  Consultants:  None  Procedures:  Antimicrobials:   Data Reviewed: I have personally reviewed following labs and imaging studies  CBC: Recent Labs  Lab 10/05/21 1826 10/06/21 0707  WBC 6.6 6.1  NEUTROABS  --  4.6  HGB 17.4* 16.5  HCT 50.9 47.8  MCV 89.0 87.2  PLT 149* 139*    Basic Metabolic Panel: Recent Labs  Lab 10/05/21 1826 10/06/21 0707  NA 136 133*  K 4.4 3.9  CL 100 99  CO2 27 23  GLUCOSE 99 96  BUN 13 11  CREATININE 1.18 1.16  CALCIUM 9.3 9.1  MG  --  1.9    GFR: Estimated Creatinine Clearance: 54.9 mL/min (by C-G formula based on SCr of 1.16 mg/dL). Liver  Function Tests: Recent Labs  Lab 10/05/21 1826 10/06/21 0707  AST 20 26  ALT 21 21  ALKPHOS 78 63  BILITOT 0.8 0.8  PROT 8.3* 7.8  ALBUMIN 4.3 4.0    No results for input(s): LIPASE, AMYLASE in the last 168 hours. Recent Labs  Lab 10/06/21 0153  AMMONIA 16    Coagulation Profile: No results for input(s): INR, PROTIME in the last 168 hours. Cardiac Enzymes: No results for input(s): CKTOTAL, CKMB, CKMBINDEX,  TROPONINI in the last 168 hours. BNP (last 3 results) No results for input(s): PROBNP in the last 8760 hours. HbA1C: No results for input(s): HGBA1C in the last 72 hours. CBG: Recent Labs  Lab 10/05/21 1815 10/06/21 0850 10/06/21 1322 10/06/21 1654 10/08/21 1200  GLUCAP 99 87 99 96 121*    Lipid Profile: No results for input(s): CHOL, HDL, LDLCALC, TRIG, CHOLHDL, LDLDIRECT in the last 72 hours. Thyroid Function Tests: Recent Labs    10/05/21 2026  TSH 0.480    Anemia Panel: Recent Labs    10/05/21 2026 10/06/21 0153  VITAMINB12  --  217  FOLATE 5.7*  --     Sepsis Labs: Recent Labs  Lab 10/05/21 1826  LATICACIDVEN 1.7     Recent Results (from the past 240 hour(s))  Resp Panel by RT-PCR (Flu A&B, Covid) Nasopharyngeal Swab     Status: Abnormal   Collection Time: 10/05/21  6:04 PM   Specimen: Nasopharyngeal Swab; Nasopharyngeal(NP) swabs in vial transport medium  Result Value Ref Range Status   SARS Coronavirus 2 by RT PCR POSITIVE (A) NEGATIVE Final    Comment: (NOTE) SARS-CoV-2 target nucleic acids are DETECTED.  The SARS-CoV-2 RNA is generally detectable in upper respiratory specimens during the acute phase of infection. Positive results are indicative of the presence of the identified virus, but do not rule out bacterial infection or co-infection with other pathogens not detected by the test. Clinical correlation with patient history and other diagnostic information is necessary to determine patient infection status. The expected result is Negative.  Fact Sheet for Patients: BloggerCourse.com  Fact Sheet for Healthcare Providers: SeriousBroker.it  This test is not yet approved or cleared by the [place] FDA and  has been authorized for detection and/or diagnosis of SARS-CoV-2 by FDA under an Emergency Use Authorization (EUA).  This EUA will remain in effect (meaning this test can be used) for  the duration of  the COVID-19 declaration under Section 564(b)(1) of the A ct, 21 U.S.C. section 360bbb-3(b)(1), unless the authorization is terminated or revoked sooner.     Influenza A by PCR NEGATIVE NEGATIVE Final   Influenza B by PCR NEGATIVE NEGATIVE Final    Comment: (NOTE) The Xpert Xpress SARS-CoV-2/FLU/RSV plus assay is intended as an aid in the diagnosis of influenza from Nasopharyngeal swab specimens and should not be used as a sole basis for treatment. Nasal washings and aspirates are unacceptable for Xpert Xpress SARS-CoV-2/FLU/RSV testing.  Fact Sheet for Patients: BloggerCourse.com  Fact Sheet for Healthcare Providers: SeriousBroker.it  This test is not yet approved or cleared by the [place] FDA and has been authorized for detection and/or diagnosis of SARS-CoV-2 by FDA under an Emergency Use Authorization (EUA). This EUA will remain in effect (meaning this test can be used) for the duration of the COVID-19 declaration under Section 564(b)(1) of the Act, 21 U.S.C. section 360bbb-3(b)(1), unless the authorization is terminated or revoked.  Performed at [facility], [address], [place], [place] [address]  Culture, blood (Routine X 2) w Reflex to ID Panel     Status: None (Preliminary result)   Collection Time: 10/06/21  1:53 AM   Specimen: BLOOD  Result Value Ref Range Status   Specimen Description BLOOD BLOOD RIGHT HAND  Final   Special Requests   Final    BOTTLES DRAWN AEROBIC AND ANAEROBIC Blood Culture adequate volume   Culture   Final    NO GROWTH 2 DAYS Performed at [facility], [address], [place], [place] [address]    Report Status PENDING  Incomplete  Culture, blood (Routine X 2) w Reflex to ID Panel     Status: None (Preliminary result)   Collection Time: 10/06/21  1:53 AM   Specimen: BLOOD  Result Value Ref Range Status   Specimen Description BLOOD RIGHT  ANTECUBITAL  Final   Special Requests IN PEDIATRIC BOTTLE Blood Culture adequate volume  Final   Culture   Final    NO GROWTH 2 DAYS Performed at [facility], [address], [place], [place] [address]    Report Status PENDING  Incomplete      Radiology Studies: No results found.  Scheduled Meds:  vitamin C  500 mg Oral Daily   atorvastatin  80 mg Oral Daily   enoxaparin (LOVENOX) injection  40 mg Subcutaneous Q24H   lisinopril  20 mg Oral Daily   zinc sulfate  220 mg Oral Daily   Continuous Infusions:  remdesivir 100 mg in NS 100 mL 100 mg (10/08/21 1100)     LOS: 2 days   Time spent: 35 minutes. More than 50% of the time was spent in counseling/coordination of care  Arnetha Courser, MD [facility]  If 7PM-7AM, please contact night-coverage [email or web address]  10/08/2021, 3:42 PM   This record has been created using Conservation officer, historic buildings. Errors have been sought and corrected,but may not always be located. Such creation errors do not reflect on the standard of care.

## 2021-10-09 LAB — CBC
HCT: 50.2 % (ref 39.0–52.0)
Hemoglobin: 16.9 g/dL (ref 13.0–17.0)
MCH: 29.6 pg (ref 26.0–34.0)
MCHC: 33.7 g/dL (ref 30.0–36.0)
MCV: 87.9 fL (ref 80.0–100.0)
Platelets: 123 10*3/uL — ABNORMAL LOW (ref 150–400)
RBC: 5.71 MIL/uL (ref 4.22–5.81)
RDW: 15 % (ref 11.5–15.5)
WBC: 3.7 10*3/uL — ABNORMAL LOW (ref 4.0–10.5)
nRBC: 0 % (ref 0.0–0.2)

## 2021-10-09 LAB — COMPREHENSIVE METABOLIC PANEL
ALT: 32 U/L (ref 0–44)
AST: 55 U/L — ABNORMAL HIGH (ref 15–41)
Albumin: 3.4 g/dL — ABNORMAL LOW (ref 3.5–5.0)
Alkaline Phosphatase: 53 U/L (ref 38–126)
Anion gap: 10 (ref 5–15)
BUN: 28 mg/dL — ABNORMAL HIGH (ref 8–23)
CO2: 25 mmol/L (ref 22–32)
Calcium: 8.6 mg/dL — ABNORMAL LOW (ref 8.9–10.3)
Chloride: 105 mmol/L (ref 98–111)
Creatinine, Ser: 1.15 mg/dL (ref 0.61–1.24)
GFR, Estimated: >60 mL/min (ref >60)
Glucose, Bld: 87 mg/dL (ref 70–99)
Potassium: 4.2 mmol/L (ref 3.5–5.1)
Sodium: 140 mmol/L (ref 135–145)
Total Bilirubin: 1 mg/dL (ref 0.3–1.2)
Total Protein: 6.6 g/dL (ref 6.5–8.1)

## 2021-10-09 LAB — C-REACTIVE PROTEIN: CRP: 1.7 mg/dL — ABNORMAL HIGH (ref <1.0)

## 2021-10-09 LAB — D-DIMER, QUANTITATIVE: D-Dimer, Quant: 0.34 ug/mL-FEU (ref 0.00–0.50)

## 2021-10-09 NOTE — Progress Notes (Signed)
Physical Therapy Treatment Patient Details Name: Austin Weaver MRN: [id number] DOB: [birth year] Today's Date: 10/09/2021   History of Present Illness Austin Weaver. is a 75yoM PMH:  HTN, CVA, vitamin B12 deficiency, presents to [facility] ED c generalized weakness and confusion. Patient was febrile on arrival and found to be positive for COVID-19 infection.    PT Comments    Pt in bed upon arrival, awake, interactive. Pt reports to feel good. Pt presents with generalized global weakness this date, struggles with all mobility requiring excessive time components, physical assistance, and close supervision due to LOB. Pt has 3 LOB in session today. Pt is a difficult historian in session. Pt essentially remains nonambulatory, would need a WC until he is able to return to AMB capacity. STR remains best recommendation at this time without having actually spoke to family/caregivers personally.     Recommendations for follow up therapy are one component of a multi-disciplinary discharge planning process, led by the attending physician.  Recommendations may be updated based on patient status, additional functional criteria and insurance authorization.  Follow Up Recommendations  Skilled nursing-short term rehab (<3 hours/day) (family interested in taking patient home)     Assistance Recommended at Discharge Frequent or constant Supervision/Assistance  Patient can return home with the following A lot of help with walking and/or transfers;Help with stairs or ramp for entrance;Assist for transportation;Assistance with cooking/housework;A lot of help with bathing/dressing/bathroom   Equipment Recommendations  Wheelchair (measurements PT);Wheelchair cushion (measurements PT)    Recommendations for Other Services       Precautions / Restrictions Precautions Precautions: Fall Restrictions Weight Bearing Restrictions: No     Mobility  Bed Mobility         Supine to sit: HOB elevated;Min  assist     General bed mobility comments: given ample time to come to sitting EOB; stops d/t fatigue before able to scoot all the way to edge. requires several minutes.    Transfers Overall transfer level: Needs assistance Equipment used: Rolling walker (2 wheels) Transfers: Sit to/from Stand Sit to Stand: Mod assist     Step pivot transfers: Min assist (extensive safety cues required, pt not wanting to use RW for any true utility, tries to sit prematurely, verylimited step height/length)     General transfer comment: attempts STS >10x without success, not following commands for hand placement, otherwise safe technique noted; Pt has LOB posterior onto bed after standing ~30sec.    Ambulation/Gait                   Stairs             Wheelchair Mobility    Modified Rankin (Stroke Patients Only)       Balance                                            Cognition Arousal/Alertness: Awake/alert Behavior During Therapy: WFL for tasks assessed/performed                                   General Comments: pt unable to answer basic questions reagrding today's performance contrasted to baseline mobility.        Exercises General Exercises - Lower Extremity Long Arc Quad: AROM;Both;15 reps;Seated;Limitations Long [employer] Limitations: unable to keep count or to maintain  quality of movement without constant verbal cues for quality    General Comments        Pertinent Vitals/Pain Pain Assessment: No/denies pain    Home Living                          Prior Function            PT Goals (current goals can now be found in the care plan section) Acute Rehab PT Goals PT Goal Formulation: Patient unable to participate in goal setting    Frequency    Min 2X/week      PT Plan Current plan remains appropriate    Co-evaluation              AM-PAC PT "6 Clicks" Mobility   Outcome Measure  Help  needed turning from your back to your side while in a flat bed without using bedrails?: A Lot Help needed moving from lying on your back to sitting on the side of a flat bed without using bedrails?: A Lot Help needed moving to and from a bed to a chair (including a wheelchair)?: A Lot Help needed standing up from a chair using your arms (e.g., wheelchair or bedside chair)?: A Lot Help needed to walk in hospital room?: Total Help needed climbing 3-5 steps with a railing? : Total 6 Click Score: 10    End of Session   Activity Tolerance: Patient tolerated treatment well;Patient limited by fatigue;No increased pain Patient left: with call bell/phone within reach;in chair;with chair alarm set Nurse Communication: Mobility status PT Visit Diagnosis: Other abnormalities of gait and mobility (R26.89);Muscle weakness (generalized) (M62.81);Difficulty in walking, not elsewhere classified (R26.2)     Time: 9924-2683 PT Time Calculation (min) (ACUTE ONLY): 29 min  Charges:  $Therapeutic Exercise: 23-37 mins                    12:09 PM, 10/09/21 Rosamaria Lints, PT, DPT Physical Therapist - [facility]  [phone] (ASCOM)     Keylah Darwish C 10/09/2021, 12:06 PM

## 2021-10-09 NOTE — TOC Initial Note (Signed)
Transition of Care [facility]) - Initial/Assessment Note    Patient Details  Name: Austin Weaver MRN: [id number] Date of Birth: 09/28/1946  Transition of Care [facility]) CM/SW Contact:    [name], LCSW Phone Number: 10/09/2021, 10:11 AM  Clinical Narrative:  Patient not fully oriented. Called his cousin, [name], introduced role, and explained that PT recommendations would be discussed. [name] declined SNF placement but is agreeable to home health services. Told her he was not able to walk in last PT session but she said that was fine. He has a walker, cane, wheelchair, and safety bars in the bathroom. He lives in a handicapped accessible apartment. Reviewed home health agencies that accept [employer]. Preference is [facility]. They can accept referral. No further concerns. CSW encouraged [name] to contact CSW as needed. CSW will continue to follow patient and his family for support and facilitate return home when stable.                Expected Discharge Plan: Home w Home Health Services Barriers to Discharge: Continued Medical Work up   Patient Goals and CMS Choice     Choice offered to / list presented to :  (Cousin)  Expected Discharge Plan and Services Expected Discharge Plan: Home w Home Health Services     Post Acute Care Choice: Home Health Living arrangements for the past 2 months: Apartment                           HH Arranged: PT, OT HH Agency: [facility] Date [facility] Agency Contacted: 10/09/21   Representative spoke with at [facility] Agency: [name]  Prior Living Arrangements/Services Living arrangements for the past 2 months: Apartment Lives with:: Relatives Patient language and need for interpreter reviewed:: Yes Do you feel safe going back to the place where you live?: Yes      Need for Family Participation in Patient Care: Yes (Comment) Care giver support system in place?: Yes (comment) Current home services: DME Criminal Activity/Legal Involvement  Pertinent to Current Situation/Hospitalization: No - Comment as needed  Activities of Daily Living   ADL Screening (condition at time of admission) Patient's cognitive ability adequate to safely complete daily activities?: Yes Is the patient deaf or have difficulty hearing?: No Does the patient have difficulty seeing, even when wearing glasses/contacts?: No Does the patient have difficulty concentrating, remembering, or making decisions?: No Patient able to express need for assistance with ADLs?: Yes Does the patient have difficulty dressing or bathing?: Yes (new onset) Independently performs ADLs?: No Communication: Independent, Needs assistance (exspressive asphagia) Is this a change from baseline?: Change from baseline, expected to last <3 days Dressing (OT): Dependent Is this a change from baseline?: Change from baseline, expected to last <3days Grooming: Dependent Is this a change from baseline?: Change from baseline, expected to last <3 days Feeding: Needs assistance Is this a change from baseline?: Change from baseline, expected to last <3 days Bathing: Dependent Is this a change from baseline?: Change from baseline, expected to last <3 days Toileting: Dependent Is this a change from baseline?:  (unsure) In/Out Bed: Dependent Is this a change from baseline?: Change from baseline, expected to last <3 days Walks in Home: Independent (currently unable to walk) Weakness of Legs: Both Weakness of Arms/Hands: Both  Permission Sought/Granted Permission sought to share information with : Facility Medical sales representative, Family Supports    Share Information with NAME: Marlane Mingle  Permission granted to share info w  AGENCY: Home Health Agencies  Permission granted to share info w Relationship: Cousin  Permission granted to share info w Contact Information: [phone]  Emotional Assessment Appearance:: Appears stated age Attitude/Demeanor/Rapport: Unable to Assess Affect  (typically observed): Unable to Assess Orientation: : Oriented to Self, Oriented to Place Alcohol / Substance Use: Not Applicable Psych Involvement: No (comment)  Admission diagnosis:  Encephalopathy acute [G93.40] COVID-19 virus infection [U07.1] Patient Active Problem List   Diagnosis Date Noted   Nicotine dependence, cigarettes, uncomplicated 10/06/2021   COVID-19 virus infection 10/05/2021   Left-sided weakness 05/01/2021   Lack of access to transportation 05/01/2021   Mixed hyperlipidemia 01/11/2020   Dry mouth 01/31/2019   Allergies 12/01/2018   Essential hypertension 12/01/2018   Food insecurity 12/01/2018   Grief reaction with prolonged bereavement 12/01/2018   Alzheimer's dementia with behavioral disturbance (HCC)    Acute metabolic encephalopathy    Vitamin B12 deficiency 09/22/2018   History of stroke 09/22/2018   Abnormal CXR 09/21/2018   Old Cerebral infarction (HCC) 09/21/2018   Weakness    Hypertensive urgency 11/21/2017   PCP:  [name], MD Pharmacy:   [facility] - [place], [place] - [address] [place] [address] Phone: [phone] Fax: [phone]     Social Determinants of Health (SDOH) Interventions    Readmission Risk Interventions No flowsheet data found.

## 2021-10-09 NOTE — Progress Notes (Signed)
PROGRESS NOTE    Austin Weaver  ZOX:[id number] DOB: Nov 24, 1945 DOA: 10/05/2021 PCP: Lynnda Childody, Jessica R, MD   Brief Narrative: Taken from H&P. 76 year old male with past medical history of hypertension, previous stroke, hyperlipidemia, vitamin B12 deficiency who presents to [facility] emergency department with generalized weakness and confusion with family. Patient was febrile on arrival and found to be positive for COVID-19 infection. Chest x-ray without any evidence of infiltrate.  He was started on remdesivir Saturating well on room air. PT is recommending SNF.  10/09/2021: Patient was seen and examined at his bedside.  Denies any chest pain.  He is on room air.  Endorses a dry cough.  He has mild bibasilar rales on exam.   Assessment & Plan:   Principal Problem:   COVID-19 virus infection Active Problems:   Hypertensive urgency   Acute metabolic encephalopathy   Mixed hyperlipidemia   Nicotine dependence, cigarettes, uncomplicated  Resolved acute metabolic encephalopathy with COVID-19 infection.   No radiologic evidence of pneumonia. Labs pretty much unremarkable. -Continue with remdesivir-day 4 -No need for steroids at this time -Continue with supportive care and supplement. -PT is recommending SNF -TOC consulted to assist with SNF placement.  Leukopenia WBC 3.7, likely in the setting of recent viral infection. Monitor and repeat CBC Monitor fever curve  Hypertension.   BP not at goal, elevated.   -Continue lisinopril at 20 mg daily. Continue to closely monitor vital signs.  Hyperlipidemia. -Continue with home statin  Nicotine dependence. Continue nicotine patch as needed  Objective: Vitals:   10/09/21 0551 10/09/21 0553 10/09/21 0801 10/09/21 1203  BP:  131/76 136/70 (!) 150/84  Pulse:  71 77 76  Resp: 18 18 17 18   Temp:  97.9 F (36.6 C) 98.6 F (37 C) 97.9 F (36.6 C)  TempSrc:   Oral Oral  SpO2:  98% 95% 100%  Weight:      Height:       No intake or  output data in the 24 hours ending 10/09/21 1446 Filed Weights   10/05/21 1758 10/06/21 0147  Weight: 54.4 kg 72.9 kg    Examination:  General.  Well-developed well-nourished in no acute distress.  He is alert and oriented x3.   Pulmonary.  Mild rales at bases with no wheezing noted.  Poor inspiratory effort.   CV.  Regular rate and rhythm no rubs or gallops. Abdomen.  Soft nontender bowel sounds present.   CNS.  Alert and oriented x3.  Nonfocal exam. Extremities.  Trace edema in lower extremities bilaterally.   Psychiatry.  Mood is appropriate for condition and setting.   DVT prophylaxis: Lovenox Code Status: Full Family Communication: Talked to the mother of his son. Disposition Plan:  Status is: Inpatient  Remains inpatient appropriate because: Severity of illness   Level of care: Telemetry Medical  All the records are reviewed and case discussed with Care Management/Social Worker. Management plans discussed with the patient, nursing and they are in agreement.  Consultants:  None  Procedures:  Antimicrobials:   Data Reviewed: I have personally reviewed following labs and imaging studies  CBC: Recent Labs  Lab 10/05/21 1826 10/06/21 0707 10/09/21 0535  WBC 6.6 6.1 3.7*  NEUTROABS  --  4.6  --   HGB 17.4* 16.5 16.9  HCT 50.9 47.8 50.2  MCV 89.0 87.2 87.9  PLT 149* 139* 123*   Basic Metabolic Panel: Recent Labs  Lab 10/05/21 1826 10/06/21 0707 10/09/21 0535  NA 136 133* 140  K 4.4 3.9 4.2  CL 100 99 105  CO2 27 23 25   GLUCOSE 99 96 87  BUN 13 11 28*  CREATININE 1.18 1.16 1.15  CALCIUM 9.3 9.1 8.6*  MG  --  1.9  --    GFR: Estimated Creatinine Clearance: 55.3 mL/min (by C-G formula based on SCr of 1.15 mg/dL). Liver Function Tests: Recent Labs  Lab 10/05/21 1826 10/06/21 0707 10/09/21 0535  AST 20 26 55*  ALT 21 21 32  ALKPHOS 78 63 53  BILITOT 0.8 0.8 1.0  PROT 8.3* 7.8 6.6  ALBUMIN 4.3 4.0 3.4*   No results for input(s): LIPASE,  AMYLASE in the last 168 hours. Recent Labs  Lab 10/06/21 0153  AMMONIA 16   Coagulation Profile: No results for input(s): INR, PROTIME in the last 168 hours. Cardiac Enzymes: No results for input(s): CKTOTAL, CKMB, CKMBINDEX, TROPONINI in the last 168 hours. BNP (last 3 results) No results for input(s): PROBNP in the last 8760 hours. HbA1C: No results for input(s): HGBA1C in the last 72 hours. CBG: Recent Labs  Lab 10/06/21 0850 10/06/21 1322 10/06/21 1654 10/08/21 1200 10/08/21 1617  GLUCAP 87 99 96 121* 128*   Lipid Profile: No results for input(s): CHOL, HDL, LDLCALC, TRIG, CHOLHDL, LDLDIRECT in the last 72 hours. Thyroid Function Tests: No results for input(s): TSH, T4TOTAL, FREET4, T3FREE, THYROIDAB in the last 72 hours. Anemia Panel: No results for input(s): VITAMINB12, FOLATE, FERRITIN, TIBC, IRON, RETICCTPCT in the last 72 hours. Sepsis Labs: Recent Labs  Lab 10/05/21 1826  LATICACIDVEN 1.7    Recent Results (from the past 240 hour(s))  Resp Panel by RT-PCR (Flu A&B, Covid) Nasopharyngeal Swab     Status: Abnormal   Collection Time: 10/05/21  6:04 PM   Specimen: Nasopharyngeal Swab; Nasopharyngeal(NP) swabs in vial transport medium  Result Value Ref Range Status   SARS Coronavirus 2 by RT PCR POSITIVE (A) NEGATIVE Final    Comment: (NOTE) SARS-CoV-2 target nucleic acids are DETECTED.  The SARS-CoV-2 RNA is generally detectable in upper respiratory specimens during the acute phase of infection. Positive results are indicative of the presence of the identified virus, but do not rule out bacterial infection or co-infection with other pathogens not detected by the test. Clinical correlation with patient history and other diagnostic information is necessary to determine patient infection status. The expected result is Negative.  Fact Sheet for Patients: 10/07/21  Fact Sheet for Healthcare  Providers: BloggerCourse.com  This test is not yet approved or cleared by the [place] FDA and  has been authorized for detection and/or diagnosis of SARS-CoV-2 by FDA under an Emergency Use Authorization (EUA).  This EUA will remain in effect (meaning this test can be used) for the duration of  the COVID-19 declaration under Section 564(b)(1) of the A ct, 21 U.S.C. section 360bbb-3(b)(1), unless the authorization is terminated or revoked sooner.     Influenza A by PCR NEGATIVE NEGATIVE Final   Influenza B by PCR NEGATIVE NEGATIVE Final    Comment: (NOTE) The Xpert Xpress SARS-CoV-2/FLU/RSV plus assay is intended as an aid in the diagnosis of influenza from Nasopharyngeal swab specimens and should not be used as a sole basis for treatment. Nasal washings and aspirates are unacceptable for Xpert Xpress SARS-CoV-2/FLU/RSV testing.  Fact Sheet for Patients: [place]  Fact Sheet for Healthcare Providers: BloggerCourse.com  This test is not yet approved or cleared by the [place] FDA and has been authorized for detection and/or diagnosis of SARS-CoV-2 by FDA under an Emergency Use Authorization (  EUA). This EUA will remain in effect (meaning this test can be used) for the duration of the COVID-19 declaration under Section 564(b)(1) of the Act, 21 U.S.C. section 360bbb-3(b)(1), unless the authorization is terminated or revoked.  Performed at [facility], [address] [place], [place] [address]   Culture, blood (Routine X 2) w Reflex to ID Panel     Status: None (Preliminary result)   Collection Time: 10/06/21  1:53 AM   Specimen: BLOOD  Result Value Ref Range Status   Specimen Description BLOOD BLOOD RIGHT HAND  Final   Special Requests   Final    BOTTLES DRAWN AEROBIC AND ANAEROBIC Blood Culture adequate volume   Culture   Final    NO GROWTH 3 DAYS Performed at [facility], [address], [place], [place] [address]    Report Status PENDING  Incomplete  Culture, blood (Routine X 2) w Reflex to ID Panel     Status: None (Preliminary result)   Collection Time: 10/06/21  1:53 AM   Specimen: BLOOD  Result Value Ref Range Status   Specimen Description BLOOD RIGHT ANTECUBITAL  Final   Special Requests IN PEDIATRIC BOTTLE Blood Culture adequate volume  Final   Culture   Final    NO GROWTH 3 DAYS Performed at [facility], [address], [place], [place] [address]    Report Status PENDING  Incomplete      Radiology Studies: No results found.  Scheduled Meds:  vitamin C  500 mg Oral Daily   atorvastatin  80 mg Oral Daily   enoxaparin (LOVENOX) injection  40 mg Subcutaneous Q24H   lisinopril  20 mg Oral Daily   zinc sulfate  220 mg Oral Daily   Continuous Infusions:  remdesivir 100 mg in NS 100 mL 100 mg (10/09/21 1053)     LOS: 3 days   Time spent: 35 minutes. More than 50% of the time was spent in counseling/coordination of care  Darlin Drop, MD [facility]  If 7PM-7AM, please contact night-coverage [email or web address]  10/09/2021, 2:46 PM   This record has been created using Conservation officer, historic buildings. Errors have been sought and corrected,but may not always be located. Such creation errors do not reflect on the standard of care.

## 2021-10-10 ENCOUNTER — Telehealth: Payer: Self-pay | Admitting: Family Medicine

## 2021-10-10 LAB — C-REACTIVE PROTEIN: CRP: 1 mg/dL — ABNORMAL HIGH (ref <1.0)

## 2021-10-10 LAB — D-DIMER, QUANTITATIVE: D-Dimer, Quant: 0.28 ug/mL-FEU (ref 0.00–0.50)

## 2021-10-10 MED ORDER — ZINC SULFATE 220 (50 ZN) MG PO CAPS: 220.0000 mg | ORAL_CAPSULE | Freq: Every day | ORAL | 0 refills | Status: AC

## 2021-10-10 MED ORDER — ASCORBIC ACID 500 MG PO TABS: 500.0000 mg | ORAL_TABLET | Freq: Every day | ORAL | 0 refills | Status: AC

## 2021-10-10 MED ORDER — AMLODIPINE BESYLATE 5 MG PO TABS: 5.0000 mg | ORAL_TABLET | Freq: Every day | ORAL | 0 refills | Status: DC

## 2021-10-10 MED ORDER — BENZONATATE 200 MG PO CAPS: 200.0000 mg | ORAL_CAPSULE | Freq: Three times a day (TID) | ORAL | 0 refills | Status: DC

## 2021-10-10 MED ORDER — POLYETHYLENE GLYCOL 3350 17 G PO PACK: 17.0000 g | PACK | Freq: Every day | ORAL | 0 refills | Status: DC | PRN

## 2021-10-10 MED ORDER — AMLODIPINE BESYLATE 5 MG PO TABS: 5.0000 mg | ORAL_TABLET | Freq: Every day | ORAL | Status: DC

## 2021-10-10 MED ORDER — ALBUTEROL SULFATE HFA 108 (90 BASE) MCG/ACT IN AERS: 2.0000 | INHALATION_SPRAY | RESPIRATORY_TRACT | 0 refills | Status: DC | PRN

## 2021-10-10 MED ORDER — CYANOCOBALAMIN 1000 MCG PO TABS: 1000.0000 ug | ORAL_TABLET | Freq: Every day | ORAL | 0 refills | Status: AC

## 2021-10-10 MED ORDER — BENZONATATE 100 MG PO CAPS: 200.0000 mg | ORAL_CAPSULE | Freq: Three times a day (TID) | ORAL | Status: DC

## 2021-10-10 MED ORDER — ATORVASTATIN CALCIUM 40 MG PO TABS: 40.0000 mg | ORAL_TABLET | Freq: Every day | ORAL | 0 refills | Status: DC

## 2021-10-10 MED ORDER — VITAMIN B-12 1000 MCG PO TABS
1000.0000 ug | ORAL_TABLET | Freq: Every day | ORAL | Status: DC
  Administered 2021-10-10: 1000 ug via ORAL
  Filled 2021-10-10: qty 1

## 2021-10-10 NOTE — Care Management Important Message (Signed)
Important Message  Patient Details  Name: Omir Cooprider MRN: [id number] Date of Birth: [birth year]   Medicare Important Message Given:  N/A - LOS <3 / Initial given by admissions     Olegario Messier A Loraine Bhullar 10/10/2021, 9:21 AM

## 2021-10-10 NOTE — TOC Transition Note (Signed)
Transition of Care [facility]) - CM/SW Discharge Note   Patient Details  Name: Austin Weaver MRN: [id number] Date of Birth: 1945/11/07  [facility] CM/SW Contact:  [name], LCSW Phone Number: 10/10/2021, 12:02 PM   Clinical Narrative:   Patient has order to discharge home today. [facility] representative is aware. EMS transport scheduled for 1:30. [name] is aware. RN will call her when they arrive. No further concerns. CSW signing off.  Final next level of care: Home w Home Health Services Barriers to Discharge: Barriers Resolved   Patient Goals and CMS Choice     Choice offered to / list presented to :  Rosalin Hawking)  Discharge Placement                Patient to be transferred to facility by: EMS Name of family member notified: Marlane Mingle Patient and family notified of of transfer: 10/10/21  Discharge Plan and Services     Post Acute Care Choice: Home Health                    HH Arranged: PT, OT, Nurse's Aide, Social Work [facility] Agency: [facility] Date [facility] Agency Contacted: 10/10/21   Representative spoke with at [facility] Agency: [name]  Social Determinants of Health (SDOH) Interventions     Readmission Risk Interventions No flowsheet data found.

## 2021-10-10 NOTE — Telephone Encounter (Signed)
Home Health verbal orders Caller Name: McNary Name: [facility] number: [phone]  Requesting OT/PT/Skilled nursing/Social Work/Speech: PT, OT, HH AIDE, SW  Reason: COVID, HYPERTENSION   Frequency:  NOT SURE YET  Please forward to [facility] pool or providers CMA

## 2021-10-10 NOTE — Discharge Summary (Signed)
Discharge Summary  Austin Weaver [id number] DOB: 28-May-1946  PCP: Lynnda Childody, Jessica R, MD  Admit date: 10/05/2021 Discharge date: 10/10/2021  Time spent: 35 minutes.  Recommendations for Outpatient Follow-up:  Follow-up with your PCP. Take your medications as prescribed. Continue PT OT with assistance and fall precautions. Completed 10 days of isolation due to COVID-19 viral infection.  Discharge Diagnoses:  Active Hospital Problems   Diagnosis Date Noted   COVID-19 virus infection 10/05/2021   Nicotine dependence, cigarettes, uncomplicated 10/06/2021   Mixed hyperlipidemia 01/11/2020   Acute metabolic encephalopathy    Hypertensive urgency 11/21/2017    Resolved Hospital Problems  No resolved problems to display.    Discharge Condition: Stable  Diet recommendation: Resume previous diet.  Vitals:   10/10/21 0808 10/10/21 1222  BP: (!) 150/86 (!) 146/81  Pulse: 67 68  Resp: 16 16  Temp: 97.8 F (36.6 C) 98.1 F (36.7 C)  SpO2: 94% 95%    History of present illness:  76 year old male with past medical history of hypertension, previous stroke, hyperlipidemia, vitamin B12 deficiency who presents to [facility] emergency department with generalized weakness and confusion with family. Patient was febrile on arrival and found to be positive for COVID-19 infection. Chest x-ray without any evidence of infiltrate.  He was started on remdesivir Saturating well on room air. PT is recommending SNF.   10/10/2021: Patient was seen at his bedside.  There were no acute events overnight.  He has no new complaints.    Hospital Course:  Principal Problem:   COVID-19 virus infection Active Problems:   Hypertensive urgency   Acute metabolic encephalopathy   Mixed hyperlipidemia   Nicotine dependence, cigarettes, uncomplicated  Resolved acute metabolic encephalopathy with COVID-19 infection.   No radiologic evidence of pneumonia. Completed course of IV antiviral,  remdesivir. Received bronchodilators -PT is recommending SNF   Leukopenia likely secondary to COVID-19 viral infection WBC 3.7, likely in the setting of recent viral infection. Follow-up with your PCP.   Hypertension.   Continue home regimen Follow-up with your PCP.   Hyperlipidemia. -Continue with home statin Follow-up with your PCP.   Discharge Exam: BP (!) 146/81 (BP Location: Right Arm)    Pulse 68    Temp 98.1 F (36.7 C) (Oral)    Resp 16    Ht 5\' 8"  (1.727 m)    Wt 72.9 kg    SpO2 95%    BMI 24.44 kg/m  General: 76 y.o. year-old male well developed well nourished in no acute distress.  Alert and interactive. Cardiovascular: Regular rate and rhythm with no rubs or gallops.  No thyromegaly or JVD noted.   Respiratory: Clear to auscultation with no wheezes or rales. Good inspiratory effort. Abdomen: Soft nontender nondistended with normal bowel sounds x4 quadrants. Musculoskeletal: No lower extremity edema. Psychiatry: Mood is appropriate for condition and setting  Discharge Instructions You were cared for by a hospitalist during your hospital stay. If you have any questions about your discharge medications or the care you received while you were in the hospital after you are discharged, you can call the unit and asked to speak with the hospitalist on call if the hospitalist that took care of you is not available. Once you are discharged, your primary care physician will handle any further medical issues. Please note that NO REFILLS for any discharge medications will be authorized once you are discharged, as it is imperative that you return to your primary care physician (or establish a relationship with a primary care physician  if you do not have one) for your aftercare needs so that they can reassess your need for medications and monitor your lab values.   Allergies as of 10/10/2021   No Known Allergies      Medication List     STOP taking these medications    aspirin  325 MG tablet   lisinopril 10 MG tablet Commonly known as: ZESTRIL       TAKE these medications    albuterol 108 (90 Base) MCG/ACT inhaler Commonly known as: VENTOLIN HFA Inhale 2 puffs into the lungs every 4 (four) hours as needed for wheezing or shortness of breath.   amLODipine 5 MG tablet Commonly known as: NORVASC Take 1 tablet (5 mg total) by mouth daily.   ascorbic acid 500 MG tablet Commonly known as: VITAMIN C Take 1 tablet (500 mg total) by mouth daily. Start taking on: October 11, 2021   atorvastatin 40 MG tablet Commonly known as: LIPITOR Take 1 tablet (40 mg total) by mouth daily. Start taking on: October 11, 2021 What changed:  medication strength how much to take additional instructions   benzonatate 200 MG capsule Commonly known as: TESSALON Take 1 capsule (200 mg total) by mouth 3 (three) times daily.   cyanocobalamin 1000 MCG tablet Take 1 tablet (1,000 mcg total) by mouth daily. Start taking on: October 11, 2021 What changed:  medication strength how much to take   polyethylene glycol 17 g packet Commonly known as: MIRALAX / GLYCOLAX Take 17 g by mouth daily as needed for mild constipation.   zinc sulfate 220 (50 Zn) MG capsule Take 1 capsule (220 mg total) by mouth daily. Start taking on: October 11, 2021       No Known Allergies  Follow-up Information     [facility] Follow up.   Specialty: Home Health Services Why: They will follow up with you for your home health needs. Contact information: [address] [place] [address] [phone]                  The results of significant diagnostics from this hospitalization (including imaging, microbiology, ancillary and laboratory) are listed below for reference.    Significant Diagnostic Studies: DG Chest 2 View  Result Date: 10/05/2021 CLINICAL DATA:  Altered mental status.  Weakness. EXAM: CHEST - 2 VIEW COMPARISON:  11/18/2018. FINDINGS: Mild  enlargement of the cardiac silhouette. No mediastinal or hilar masses. No adenopathy. Prominent bronchovascular markings.  No lung consolidation or edema. No pleural effusion or pneumothorax. Skeletal structures are intact. IMPRESSION: No active cardiopulmonary disease. Electronically Signed   By: Amie Portland M.D.   On: 10/05/2021 19:17   CT HEAD WO CONTRAST ( )  Result Date: 10/05/2021 CLINICAL DATA:  Altered mental status and weakness. EXAM: CT HEAD WITHOUT CONTRAST TECHNIQUE: Contiguous axial images were obtained from the base of the skull through the vertex without intravenous contrast. COMPARISON:  November 18, 2018 FINDINGS: Brain: There is mild cerebral atrophy with widening of the extra-axial spaces and ventricular dilatation. There are areas of decreased attenuation within the white matter tracts of the supratentorial brain, consistent with microvascular disease changes. Vascular: Extensive calcification of the branches of the carotid arteries is noted. Skull: Normal. Negative for fracture or focal lesion. Sinuses/Orbits: No acute finding. Other: None. IMPRESSION: 1. Generalized cerebral atrophy. 2. No acute intracranial abnormality. Electronically Signed   By: Aram Candela M.D.   On: 10/05/2021 23:51   MR BRAIN WO CONTRAST  Result Date: 10/06/2021 CLINICAL DATA:  Neuro deficit with acute stroke suspected EXAM: MRI HEAD WITHOUT CONTRAST TECHNIQUE: Multiplanar, multiecho pulse sequences of the brain and surrounding structures were obtained without intravenous contrast. COMPARISON:  Head CT from yesterday.  Brain MRI 09/22/2018 FINDINGS: Brain: No acute infarction, hemorrhage, hydrocephalus, extra-axial collection or mass lesion. Advanced chronic small vessel ischemia with confluent gliosis in the cerebral white matter and pons. Chronic perforator infarct at the right basal ganglia. Cerebral volume loss with ventriculomegaly. Vascular: Absent right vertebral flow void. Skull and upper  cervical spine: Negative for marrow lesion. Sinuses/Orbits: Negative IMPRESSION: 1. No acute finding or significant change from 2019 comparison. 2. Advanced chronic small vessel disease. 3. Slow or absent flow in the right vertebral artery. Electronically Signed   By: Tiburcio Pea M.D.   On: 10/06/2021 04:26    Microbiology: Recent Results (from the past 240 hour(s))  Resp Panel by RT-PCR (Flu A&B, Covid) Nasopharyngeal Swab     Status: Abnormal   Collection Time: 10/05/21  6:04 PM   Specimen: Nasopharyngeal Swab; Nasopharyngeal(NP) swabs in vial transport medium  Result Value Ref Range Status   SARS Coronavirus 2 by RT PCR POSITIVE (A) NEGATIVE Final    Comment: (NOTE) SARS-CoV-2 target nucleic acids are DETECTED.  The SARS-CoV-2 RNA is generally detectable in upper respiratory specimens during the acute phase of infection. Positive results are indicative of the presence of the identified virus, but do not rule out bacterial infection or co-infection with other pathogens not detected by the test. Clinical correlation with patient history and other diagnostic information is necessary to determine patient infection status. The expected result is Negative.  Fact Sheet for Patients: BloggerCourse.com  Fact Sheet for Healthcare Providers: SeriousBroker.it  This test is not yet approved or cleared by the [place] FDA and  has been authorized for detection and/or diagnosis of SARS-CoV-2 by FDA under an Emergency Use Authorization (EUA).  This EUA will remain in effect (meaning this test can be used) for the duration of  the COVID-19 declaration under Section 564(b)(1) of the A ct, 21 U.S.C. section 360bbb-3(b)(1), unless the authorization is terminated or revoked sooner.     Influenza A by PCR NEGATIVE NEGATIVE Final   Influenza B by PCR NEGATIVE NEGATIVE Final    Comment: (NOTE) The Xpert Xpress SARS-CoV-2/FLU/RSV plus assay is  intended as an aid in the diagnosis of influenza from Nasopharyngeal swab specimens and should not be used as a sole basis for treatment. Nasal washings and aspirates are unacceptable for Xpert Xpress SARS-CoV-2/FLU/RSV testing.  Fact Sheet for Patients: BloggerCourse.com  Fact Sheet for Healthcare Providers: SeriousBroker.it  This test is not yet approved or cleared by the [place] FDA and has been authorized for detection and/or diagnosis of SARS-CoV-2 by FDA under an Emergency Use Authorization (EUA). This EUA will remain in effect (meaning this test can be used) for the duration of the COVID-19 declaration under Section 564(b)(1) of the Act, 21 U.S.C. section 360bbb-3(b)(1), unless the authorization is terminated or revoked.  Performed at [facility], [address] [place], [place] [address]   Culture, blood (Routine X 2) w Reflex to ID Panel     Status: None (Preliminary result)   Collection Time: 10/06/21  1:53 AM   Specimen: BLOOD  Result Value Ref Range Status   Specimen Description BLOOD BLOOD RIGHT HAND  Final   Special Requests   Final    BOTTLES DRAWN AEROBIC AND ANAEROBIC Blood Culture adequate volume  Culture   Final    NO GROWTH 4 DAYS Performed at [facility], [address] [place], [place] [address]    Report Status PENDING  Incomplete  Culture, blood (Routine X 2) w Reflex to ID Panel     Status: None (Preliminary result)   Collection Time: 10/06/21  1:53 AM   Specimen: BLOOD  Result Value Ref Range Status   Specimen Description BLOOD RIGHT ANTECUBITAL  Final   Special Requests IN PEDIATRIC BOTTLE Blood Culture adequate volume  Final   Culture   Final    NO GROWTH 4 DAYS Performed at [facility], [address] [place], [place] [address]    Report Status PENDING  Incomplete     Labs: Basic Metabolic Panel: Recent Labs  Lab 10/05/21 1826 10/06/21 0707  10/09/21 0535  NA 136 133* 140  K 4.4 3.9 4.2  CL 100 99 105  CO2 27 23 25   GLUCOSE 99 96 87  BUN 13 11 28*  CREATININE 1.18 1.16 1.15  CALCIUM 9.3 9.1 8.6*  MG  --  1.9  --    Liver Function Tests: Recent Labs  Lab 10/05/21 1826 10/06/21 0707 10/09/21 0535  AST 20 26 55*  ALT 21 21 32  ALKPHOS 78 63 53  BILITOT 0.8 0.8 1.0  PROT 8.3* 7.8 6.6  ALBUMIN 4.3 4.0 3.4*   No results for input(s): LIPASE, AMYLASE in the last 168 hours. Recent Labs  Lab 10/06/21 0153  AMMONIA 16   CBC: Recent Labs  Lab 10/05/21 1826 10/06/21 0707 10/09/21 0535  WBC 6.6 6.1 3.7*  NEUTROABS  --  4.6  --   HGB 17.4* 16.5 16.9  HCT 50.9 47.8 50.2  MCV 89.0 87.2 87.9  PLT 149* 139* 123*   Cardiac Enzymes: No results for input(s): CKTOTAL, CKMB, CKMBINDEX, TROPONINI in the last 168 hours. BNP: BNP (last 3 results) No results for input(s): BNP in the last 8760 hours.  ProBNP (last 3 results) No results for input(s): PROBNP in the last 8760 hours.  CBG: Recent Labs  Lab 10/06/21 0850 10/06/21 1322 10/06/21 1654 10/08/21 1200 10/08/21 1617  GLUCAP 87 99 96 121* 128*       Signed:  Darlin Droparole N Illona Bulman, MD [facility] 10/10/2021, 3:45 PM

## 2021-10-10 NOTE — Telephone Encounter (Signed)
Approved.  

## 2021-10-10 NOTE — TOC Progression Note (Addendum)
Transition of Care [facility]) - Progression Note    Patient Details  Name: Austin Weaver MRN: [id number] Date of Birth: 11/10/1945  Transition of Care [facility]) CM/SW Contact  [name], LCSW Phone Number: 10/10/2021, 8:58 AM  Clinical Narrative:  Notified [name] that [name] can accept him for home health services. Discussed level of assistance needed yesterday with PT and she still wants to bring him home. She confirmed he already has a wheelchair. Patient will need EMS transport home once discharged. Address on facesheet is correct and it is a first-floor apartment.   11:09 am: [name] representative is checking to see if they will have a nurse available next week. Will ask MD to enter [name] orders once confirmed.  Expected Discharge Plan: Home w Home Health Services Barriers to Discharge: Continued Medical Work up  Expected Discharge Plan and Services Expected Discharge Plan: Home w Home Health Services     Post Acute Care Choice: Home Health Living arrangements for the past 2 months: Apartment                           HH Arranged: PT, OT HH Agency: [facility] Date [name] Agency Contacted: 10/09/21   Representative spoke with at [name] Agency: [name]   Social Determinants of Health (SDOH) Interventions    Readmission Risk Interventions No flowsheet data found.

## 2021-10-11 ENCOUNTER — Telehealth: Payer: Self-pay | Admitting: Family Medicine

## 2021-10-11 LAB — CULTURE, BLOOD (ROUTINE X 2)
Culture: NO GROWTH
Culture: NO GROWTH
Special Requests: ADEQUATE
Special Requests: ADEQUATE

## 2021-10-11 NOTE — Telephone Encounter (Signed)
I will sign home health orders for Dr. Selena Batten. Dr. Selena Batten is out of the office until mid Feb 2023

## 2021-10-11 NOTE — Telephone Encounter (Signed)
Austin Weaver with [facility] called asking if Dr Selena Batten would sign Home Health Orders for pt. Please advise.

## 2021-10-14 ENCOUNTER — Telehealth: Payer: Self-pay

## 2021-10-14 NOTE — Telephone Encounter (Signed)
Spoke to [place] at [facility]. Verbal orders given, per Mayra Reel for OT, PT, HH, SW and nursing.

## 2021-10-14 NOTE — Telephone Encounter (Signed)
Awaiting faxed orders. Nothing received yet, but verbal orders have been given to [facility] [place] on 10/14/21

## 2021-10-14 NOTE — Telephone Encounter (Addendum)
Transition Care Management Unsuccessful Follow-up Telephone Call  Date of discharge and from where:  TCM DC [facility] 10-10-21 Dx COVID  Attempts:  1st Attempt  Reason for unsuccessful TCM follow-up call:  Left voice message  Transition Care Management Follow-up Telephone Call Date of discharge and from where: TCM DC [facility] 10-10-21 Dx COVID How have you been since you were released from the hospital? Feeling better  Any questions or concerns? No  Items Reviewed: Did the pt receive and understand the discharge instructions provided? Yes  Medications obtained and verified? Yes  Other? No  Any new allergies since your discharge? No  Dietary orders reviewed? Yes Do you have support at home? Yes   Home Care and Equipment/Supplies: Were home health services ordered? yes If so, what is the name of the agency? [facility] PT/OT/ ST/nurse Has the agency set up a time to come to the patient's home? yes Were any new equipment or medical supplies ordered?  No What is the name of the medical supply agency? na Were you able to get the supplies/equipment? not applicable Do you have any questions related to the use of the equipment or supplies? No  Functional Questionnaire: (I = Independent and D = Dependent) ADLs: I  Bathing/Dressing- I  Meal Prep- I  Eating- I  Maintaining continence- I  Transferring/Ambulation- I  Managing Meds- I  Follow up appointments reviewed:  PCP Hospital f/u appt confirmed? Yes  Sched[place] Dr Toney Reil [place] [facility] f[facility]t confirmed? No . Are transportation arrangements needed? No  If their condition worsens, is the pt aware to call PCP or go to the Emergency Dept.? Yes Was the patient provided with contact information for the PCP's office or ED? Yes Was to pt encouraged to call back with questions or concerns? Yes

## 2021-10-15 ENCOUNTER — Telehealth: Payer: Self-pay | Admitting: Family Medicine

## 2021-10-15 NOTE — Telephone Encounter (Signed)
Home Health verbal orders Caller Name: Judeth Cornfield Agency Name: [facility]  Callback number: [phone]  Requesting OT/PT/Skilled nursing/Social Work/Speech: OT  Reason: POST COVID  Frequency: 1w1 2w2 1w1  Please forward to [facility] pool or providers CMA

## 2021-10-15 NOTE — Telephone Encounter (Signed)
[facility]   Requesting verbal orders for speech therapy  1 a week for 4 weeks (cognitive, deficit, swallowing)  Boneta Lucks: [phone]

## 2021-10-15 NOTE — Telephone Encounter (Signed)
Home Health verbal orders Caller Name: Ashok Cordia Agency Name: [facility] number: [phone]  Requesting OT/PT/Skilled nursing/Social Work/Speech: MSW  Reason: fill out apps for community resources  Frequency: 1 More time for next week  Please forward to [facility] pool or providers CMA

## 2021-10-16 ENCOUNTER — Ambulatory Visit: Payer: Medicare HMO | Admitting: Nurse Practitioner

## 2021-10-16 ENCOUNTER — Telehealth: Payer: Self-pay

## 2021-10-16 NOTE — Telephone Encounter (Signed)
Spoke to [place], patient's friend, patient missed appointment due to his ride had covid and was just told today. [name] will call back to reschedule, have to find a ride for the patient, they are not able to drive themselves. Patient is getting home health services. Advised/explained to [name] about our location and that if it is closer/easier to go to [place] we could get him scheduled there. Expressed importance of calling to reschedule

## 2021-10-16 NOTE — Telephone Encounter (Signed)
Approved.  

## 2021-10-16 NOTE — Telephone Encounter (Signed)
Spoke to [place] and relayed verbal orders, per Mayra Reel.

## 2021-10-16 NOTE — Telephone Encounter (Signed)
Spoke to Leslie from [facility], and relayed verbal orders for speech therapy  1 x a week for 4 weeks (cognitive, deficit, swallowing), per Mayra Reel, NP.

## 2021-10-17 NOTE — Telephone Encounter (Signed)
Spoke to [place] with [facility] and gave verbal orders, per Mayra Reel   OT    Frequency: 1w1 2w2 1w1

## 2021-10-21 ENCOUNTER — Telehealth: Payer: Self-pay | Admitting: Family Medicine

## 2021-10-21 NOTE — Telephone Encounter (Signed)
Home Health verbal orders Caller Name:Marissa Agency Name: [facility]  Callback number: [phone]  Requesting OT/PT/Skilled nursing/Social Work/Speech:  Reason:MSW Sub Sequence visit  Frequency:  Please forward to [facility] pool or providers CMA

## 2021-10-21 NOTE — Telephone Encounter (Signed)
Approved.  

## 2021-10-22 NOTE — Telephone Encounter (Signed)
Attempted to call Austin Weaver, but no answer and her VM was not secure.

## 2021-10-23 NOTE — Telephone Encounter (Signed)
Left v/m requesting Austin Weaver with [facility] to call [facility].

## 2021-11-05 ENCOUNTER — Telehealth: Payer: Self-pay | Admitting: Family Medicine

## 2021-11-05 NOTE — Telephone Encounter (Signed)
Approved.  

## 2021-11-05 NOTE — Telephone Encounter (Signed)
Home Health verbal orders [place] Agency Name: [facility] number: [phone]  Requesting OT/PT/Skilled nursing/Social Work/Speech:  Reason:OT  Frequency:Continue OT for 1wk for 4wks  Please forward to [facility] pool or providers CMA

## 2021-11-06 NOTE — Telephone Encounter (Signed)
Verbal orders given to [facility] for OT, once a week for 4 weeks, per Allie Bossier.

## 2021-11-25 ENCOUNTER — Telehealth: Payer: Self-pay | Admitting: Family Medicine

## 2021-11-25 NOTE — Telephone Encounter (Signed)
[facility] and wanted to relate the message that Austin Weaver wanted to cancel PT for the rest of the week due to death in the family

## 2022-04-01 ENCOUNTER — Telehealth: Payer: Self-pay | Admitting: Family Medicine

## 2022-04-01 NOTE — Telephone Encounter (Signed)
Tried calling patient to schedule Medicare Annual Wellness Visit (AWV) either virtually or phone  No answer  awv-s 09/05/17 per [place]     I left my direct # [phone]

## 2022-05-05 ENCOUNTER — Encounter: Payer: Medicare HMO | Admitting: Family Medicine

## 2022-05-07 ENCOUNTER — Telehealth: Payer: Self-pay

## 2022-05-07 ENCOUNTER — Encounter: Payer: Medicare HMO | Admitting: Family Medicine

## 2022-05-07 NOTE — Telephone Encounter (Signed)
Cancelled appt as requested. Sending to lsc support for reschedule.

## 2022-05-07 NOTE — Telephone Encounter (Signed)
[facility] Night - Client Nonclinical Telephone Record  [facility] Client [facility] Night - Client Client Site [facility] - Night Provider Gweneth Dimitri- MD Contact Type Call Who Is Calling Patient / Member / Family / Caregiver Caller Name Natanel Snavely Caller Phone Number [phone] Patient Name Austin Weaver. Patient DOB [birth year] Call Type Message Only Information Provided Reason for Call Request to Reschedule Office Appointment Initial Comment Caller states he would like to reschedule his father's appointment at 11am Patient request to speak to RN No Additional Comment Office hours provided Son would like to have an appointment for father within two weeks Disp. Time Disposition Final User 05/07/2022 7:38:18 AM General Information Provided Yes Dennie Fetters Call Closed By: Dennie Fetters Transaction Date/Time: 05/07/2022 7:31:26 AM (ET

## 2022-05-28 ENCOUNTER — Encounter: Payer: Self-pay | Admitting: Family Medicine

## 2022-05-28 ENCOUNTER — Ambulatory Visit (INDEPENDENT_AMBULATORY_CARE_PROVIDER_SITE_OTHER): Payer: Medicare HMO | Admitting: Family Medicine

## 2022-05-28 VITALS — BP 160/82 | HR 68 | Temp 97.5°F | Ht 64.5 in | Wt 177.2 lb

## 2022-05-28 DIAGNOSIS — I10 Essential (primary) hypertension: Secondary | ICD-10-CM

## 2022-05-28 DIAGNOSIS — B351 Tinea unguium: Secondary | ICD-10-CM | POA: Insufficient documentation

## 2022-05-28 DIAGNOSIS — F17210 Nicotine dependence, cigarettes, uncomplicated: Secondary | ICD-10-CM | POA: Diagnosis not present

## 2022-05-28 DIAGNOSIS — E782 Mixed hyperlipidemia: Secondary | ICD-10-CM | POA: Diagnosis not present

## 2022-05-28 DIAGNOSIS — Z5320 Procedure and treatment not carried out because of patient's decision for unspecified reasons: Secondary | ICD-10-CM | POA: Insufficient documentation

## 2022-05-28 DIAGNOSIS — Z1211 Encounter for screening for malignant neoplasm of colon: Secondary | ICD-10-CM | POA: Diagnosis not present

## 2022-05-28 DIAGNOSIS — F02818 Dementia in other diseases classified elsewhere, unspecified severity, with other behavioral disturbance: Secondary | ICD-10-CM | POA: Diagnosis not present

## 2022-05-28 DIAGNOSIS — G309 Alzheimer's disease, unspecified: Secondary | ICD-10-CM | POA: Diagnosis not present

## 2022-05-28 LAB — LIPID PANEL
Cholesterol: 192 mg/dL (ref 0–200)
HDL: 53.4 mg/dL (ref >39.00)
LDL Cholesterol: 122 mg/dL — ABNORMAL HIGH (ref 0–99)
NonHDL: 138.38
Total CHOL/HDL Ratio: 4
Triglycerides: 80 mg/dL (ref 0.0–149.0)
VLDL: 16 mg/dL (ref 0.0–40.0)

## 2022-05-28 LAB — CBC
HCT: 47.5 % (ref 39.0–52.0)
Hemoglobin: 15.7 g/dL (ref 13.0–17.0)
MCHC: 33 g/dL (ref 30.0–36.0)
MCV: 91.5 fl (ref 78.0–100.0)
Platelets: 146 10*3/uL — ABNORMAL LOW (ref 150.0–400.0)
RBC: 5.19 Mil/uL (ref 4.22–5.81)
RDW: 15.8 % — ABNORMAL HIGH (ref 11.5–15.5)
WBC: 6.1 10*3/uL (ref 4.0–10.5)

## 2022-05-28 LAB — COMPREHENSIVE METABOLIC PANEL
ALT: 9 U/L (ref 0–53)
AST: 12 U/L (ref 0–37)
Albumin: 4.2 g/dL (ref 3.5–5.2)
Alkaline Phosphatase: 76 U/L (ref 39–117)
BUN: 8 mg/dL (ref 6–23)
CO2: 28 mEq/L (ref 19–32)
Calcium: 9.1 mg/dL (ref 8.4–10.5)
Chloride: 103 mEq/L (ref 96–112)
Creatinine, Ser: 1.13 mg/dL (ref 0.40–1.50)
GFR: 63.98 mL/min (ref >60.00)
Glucose, Bld: 74 mg/dL (ref 70–99)
Potassium: 4.1 mEq/L (ref 3.5–5.1)
Sodium: 137 mEq/L (ref 135–145)
Total Bilirubin: 0.8 mg/dL (ref 0.2–1.2)
Total Protein: 7.1 g/dL (ref 6.0–8.3)

## 2022-05-28 MED ORDER — ATORVASTATIN CALCIUM 40 MG PO TABS: 40.0000 mg | ORAL_TABLET | Freq: Every day | ORAL | 3 refills | Status: DC

## 2022-05-28 MED ORDER — AMLODIPINE BESYLATE 10 MG PO TABS: 10.0000 mg | ORAL_TABLET | Freq: Every day | ORAL | 0 refills | Status: DC

## 2022-05-28 NOTE — Assessment & Plan Note (Addendum)
Continues to smoke, not interested in quitting. Will consider lung cancer screening

## 2022-05-28 NOTE — Assessment & Plan Note (Signed)
Cont atorvastatin 40 mg. Repeat non-fasting labs today

## 2022-05-28 NOTE — Assessment & Plan Note (Addendum)
Poorly controlled. Increase amlodipine 5>10 mg. Return in 4 weeks for bp check

## 2022-05-28 NOTE — Assessment & Plan Note (Signed)
Reports he is stable. Return in 4 weeks for [facility] and plan memory evaluation.

## 2022-05-28 NOTE — Patient Instructions (Addendum)
Consider Lung Cancer screening  Stool test ordered  #Referral I have placed a referral to a specialist for you. You should receive a phone call from the specialty office. Make sure your voicemail is not full and that if you are able to answer your phone to unknown or new numbers.   It may take up to 2 weeks to hear about the referral. If you do not hear anything in 2 weeks, please call our office and ask to speak with the referral coordinator.  - podiatry    Would recommend Shingles and Tdap at the pharmacy  Would recommend pneumonia vaccine  Your blood pressure high.   High blood pressure increases your risk for heart attack and stroke.    Please check your blood pressure 2-4 times a week.   To check your blood pressure 1) Sit in a quiet and relaxed place for 5 minutes 2) Make sure your feet are flat on the ground 3) Consider checking first thing in the morning   Normal blood pressure is less than 140/90 Ideally you blood pressure should be around 120/80  Other ways you can reduce your blood pressure:  1) Regular exercise -- Try to get 150 minutes (30 minutes, 5 days a week) of moderate to vigorous aerobic excercise -- Examples: brisk walking (2.5 miles per hour), water aerobics, dancing, gardening, tennis, biking slower than 10 miles per hour 2) DASH Diet - low fat meats, more fresh fruits and vegetables, whole grains, low salt 3) Quit smoking if you smoke 4) Loose 5-10% of your body weight

## 2022-05-28 NOTE — Assessment & Plan Note (Signed)
Severe. Referral to podiatry for treatment and nail clipping

## 2022-05-28 NOTE — Progress Notes (Signed)
Subjective:     Austin Weaver is a 76 y.o. male presenting for Medication Refill     HPI  HTN - has been taking amlodipine 5 mg - feeling well - no cp, htn, dizzy spells, vision changes   #HLD - atorvastatin 40 mg  Memory - pretty good  Tobacco use - not interested   Also taking zinc supplement B12 supplement - he did run out of this  Review of Systems   Social History   Tobacco Use  Smoking Status Every Day   Packs/day: 0.50   Years: 50.00   Total pack years: 25.00   Types: Cigarettes  Smokeless Tobacco Never        Objective:    BP Readings from Last 3 Encounters:  05/28/22 (!) 160/82  10/10/21 (!) 146/81  05/01/21 (!) 196/102   Wt Readings from Last 3 Encounters:  05/28/22 177 lb 4 oz (80.4 kg)  10/06/21 160 lb 11.5 oz (72.9 kg)  05/01/21 182 lb (82.6 kg)    BP (!) 160/82   Pulse 68   Temp (!) 97.5 F (36.4 C) (Temporal)   Ht 5' 4.5" (1.638 m)   Wt 177 lb 4 oz (80.4 kg)   SpO2 99%   BMI 29.96 kg/m    Physical Exam Constitutional:      Appearance: Normal appearance. He is not ill-appearing or diaphoretic.  HENT:     Right Ear: External ear normal.     Left Ear: External ear normal.     Nose: Nose normal.  Eyes:     General: No scleral icterus.    Extraocular Movements: Extraocular movements intact.     Conjunctiva/sclera: Conjunctivae normal.  Cardiovascular:     Rate and Rhythm: Normal rate and regular rhythm.     Heart sounds: Murmur heard.  Pulmonary:     Effort: Pulmonary effort is normal. No respiratory distress.     Breath sounds: Normal breath sounds. No wheezing.  Musculoskeletal:     Cervical back: Neck supple.  Skin:    General: Skin is warm and dry.     Comments: Patient hesitant to show feet. Right foot examined - dry, flaking skin, with thickened, discolored toenails which are curved.   Neurological:     Mental Status: He is alert. Mental status is at baseline.  Psychiatric:        Mood and Affect: Mood  normal.           Assessment & Plan:   Problem List Items Addressed This Visit       Cardiovascular and Mediastinum   Essential hypertension - Primary    Poorly controlled. Increase amlodipine 5>10 mg. Return in 4 weeks for bp check       Relevant Medications   amLODipine (NORVASC) 10 MG tablet   atorvastatin (LIPITOR) 40 MG tablet   Other Relevant Orders   Comprehensive metabolic panel   CBC     Nervous and Auditory   Alzheimer's dementia with behavioral disturbance (HCC)    Reports he is stable. Return in 4 weeks for [facility] and plan memory evaluation.         Musculoskeletal and Integument   Onychomycosis    Severe. Referral to podiatry for treatment and nail clipping      Relevant Orders   Ambulatory referral to Podiatry     Other   Mixed hyperlipidemia    Cont atorvastatin 40 mg. Repeat non-fasting labs today      Relevant Medications  amLODipine (NORVASC) 10 MG tablet   atorvastatin (LIPITOR) 40 MG tablet   Other Relevant Orders   Lipid panel   Nicotine dependence, cigarettes, uncomplicated    Continues to smoke, not interested in quitting. Will consider lung cancer screening      Lung cancer screening declined by patient   Other Visit Diagnoses     Screening for colon cancer       Relevant Orders   Fecal occult blood, imunochemical        Return in about 4 weeks (around 06/25/2022) for Medicare wellness and blood pressure check.  [name], MD

## 2022-06-11 ENCOUNTER — Ambulatory Visit (INDEPENDENT_AMBULATORY_CARE_PROVIDER_SITE_OTHER): Payer: Self-pay | Admitting: Podiatry

## 2022-06-11 DIAGNOSIS — Z91199 Patient's noncompliance with other medical treatment and regimen due to unspecified reason: Secondary | ICD-10-CM

## 2022-06-11 NOTE — Progress Notes (Signed)
Patient was no-show for appointment today 

## 2022-06-25 ENCOUNTER — Ambulatory Visit: Payer: Medicare HMO | Admitting: Family Medicine

## 2022-07-09 ENCOUNTER — Ambulatory Visit: Payer: Medicare HMO | Admitting: Family Medicine

## 2022-07-11 ENCOUNTER — Ambulatory Visit (INDEPENDENT_AMBULATORY_CARE_PROVIDER_SITE_OTHER): Payer: Medicare HMO | Admitting: Family Medicine

## 2022-07-11 VITALS — BP 130/70 | HR 70 | Temp 97.4°F | Ht 64.5 in | Wt 172.5 lb

## 2022-07-11 DIAGNOSIS — Z0000 Encounter for general adult medical examination without abnormal findings: Secondary | ICD-10-CM | POA: Diagnosis not present

## 2022-07-11 DIAGNOSIS — F02818 Dementia in other diseases classified elsewhere, unspecified severity, with other behavioral disturbance: Secondary | ICD-10-CM | POA: Diagnosis not present

## 2022-07-11 DIAGNOSIS — G309 Alzheimer's disease, unspecified: Secondary | ICD-10-CM | POA: Diagnosis not present

## 2022-07-11 DIAGNOSIS — I10 Essential (primary) hypertension: Secondary | ICD-10-CM | POA: Diagnosis not present

## 2022-07-11 MED ORDER — AMLODIPINE BESYLATE 10 MG PO TABS: 10.0000 mg | ORAL_TABLET | Freq: Every day | ORAL | 1 refills | Status: DC

## 2022-07-11 NOTE — Assessment & Plan Note (Signed)
Controlled. Cont amlodipine 10 mg 

## 2022-07-11 NOTE — Assessment & Plan Note (Signed)
His 6 CIT is severely elevated.  He does seem to have support at home. Advised considering neurology to slow progression. Referral placed.

## 2022-07-11 NOTE — Progress Notes (Signed)
Subjective:   Austin Weaver is a 76 y.o. male who presents for Medicare Annual/Subsequent preventive examination.  Review of Systems    Review of Systems  Constitutional:  Negative for chills and fever.  HENT:  Negative for congestion and sore throat.   Eyes:  Negative for blurred vision and double vision.  Respiratory:  Negative for shortness of breath.   Cardiovascular:  Negative for chest pain.  Gastrointestinal:  Negative for heartburn, nausea and vomiting.  Genitourinary: Negative.   Musculoskeletal: Negative.  Negative for myalgias.  Skin:  Negative for rash.  Neurological:  Negative for dizziness and headaches.  Endo/Heme/Allergies:  Does not bruise/bleed easily.  Psychiatric/Behavioral:  Negative for depression. The patient is not nervous/anxious.     Cardiac Risk Factors include: advanced age (>74men, >57 women);dyslipidemia;hypertension;male gender;sedentary lifestyle     Objective:    Today's Vitals   07/11/22 1107  BP: 130/70  Pulse: 70  Temp: (!) 97.4 F (36.3 C)  TempSrc: Temporal  SpO2: 99%  Weight: 172 lb 8 oz (78.2 kg)  Height: 5' 4.5" (1.638 m)   Body mass index is 29.15 kg/m.     07/11/2022   11:14 AM 10/06/2021    5:00 AM 10/05/2021    6:03 PM 11/18/2018   10:24 AM 11/21/2017    5:00 AM 11/20/2017    7:46 PM  Advanced Directives  Does Patient Have a Medical Advance Directive? Yes No No Yes No No  Type of Advance Directive    [place]    Does patient want to make changes to medical advance directive? Yes (MAU/Ambulatory/Procedural Areas - Information given)   No - Patient declined    Would patient like information on creating a medical advance directive?     No - Patient declined     Current Medications (verified) Outpatient Encounter Medications as of 07/11/2022  Medication Sig   atorvastatin (LIPITOR) 40 MG tablet Take 1 tablet (40 mg total) by mouth daily.   polyethylene glycol (MIRALAX / GLYCOLAX) 17 g packet Take 17  g by mouth daily as needed for mild constipation.   [DISCONTINUED] amLODipine (NORVASC) 10 MG tablet Take 1 tablet (10 mg total) by mouth daily.   amLODipine (NORVASC) 10 MG tablet Take 1 tablet (10 mg total) by mouth daily.   No facility-administered encounter medications on file as of 07/11/2022.    Allergies (verified) Patient has no known allergies.   History: Past Medical History:  Diagnosis Date   Allergy    Fall 09/21/2018   History of transient ischemic attack (TIA) 11/20/2017   History of transient ischemic attack (TIA) 11/20/2017   Hypertension    Old Cerebral infarction ([place]) 09/21/2018   Remote L. thalamic and R. external capsul & corona radiata infarcts   Stroke [facility])    Past Surgical History:  Procedure Laterality Date   laporotomy     Family History  Problem Relation Age of Onset   Hypertension Mother    Diabetes Mother    Stroke Mother    Stroke Father    Stroke Maternal Grandmother    Stroke Maternal Grandfather    Cancer Maternal Grandfather        unknown type   Stroke Paternal Grandmother    Stroke Paternal Grandfather    Kidney failure Son    Social History   Socioeconomic History   Marital status: Significant Other    Spouse name: Austin Weaver   Number of children: 5   Years of education: 11th grade  Highest education level: Not on file  Occupational History   Not on file  Tobacco Use   Smoking status: Every Day    Packs/day: 0.50    Years: 50.00    Total pack years: 25.00    Types: Cigarettes   Smokeless tobacco: Never  Vaping Use   Vaping Use: Never used  Substance and Sexual Activity   Alcohol use: No   Drug use: Never   Sexual activity: Not Currently  Other Topics Concern   Not on file  Social History Narrative   Gets retirement and social security   Lives with Austin Weaver in a handicap apartment   Has 5 children (one son has passed away)   Social Determinants of Health   Financial Resource Strain: Medium Risk (12/01/2018)    Overall Financial Resource Strain (CARDIA)    Difficulty of Paying Living Expenses: Somewhat hard  Food Insecurity: Not on file  Transportation Needs: Not on file  Physical Activity: Not on file  Stress: Not on file  Social Connections: Not on file    Tobacco Counseling Ready to quit: Not Answered Counseling given: Not Answered   Clinical Intake:  Pre-visit preparation completed: No  Pain : No/denies pain     BMI - recorded: 29.15 Nutritional Status: BMI 25 -29 Overweight Nutritional Risks: None Diabetes: No  How often do you need to have someone help you when you read instructions, pamphlets, or other written materials from your doctor or pharmacy?: 5 - Always  Diabetic? no  Interpreter Needed?: No      Activities of Daily Living    07/11/2022   11:15 AM 10/06/2021    5:00 AM  In your present state of health, do you have any difficulty performing the following activities:  Hearing? 0 0  Vision? 0 0  Difficulty concentrating or making decisions? 1 0  Walking or climbing stairs? 0   Dressing or bathing? 0 1  Comment  new onset  Doing errands, shopping? 1 0  Comment does not drive   Preparing Food and eating ? Y   Comment someone else prepares food   Using the Toilet? N   In the past six months, have you accidently leaked urine? N   Do you have problems with loss of bowel control? N   Managing your Medications? Y   Managing your Finances? Y   Housekeeping or managing your Housekeeping? Y     Patient Care Team: [name], MD as PCP - General (Family Medicine)  Indicate any recent Medical Services you may have received from other than [facility] providers in the past year (date may be approximate).     Assessment:   This is a routine wellness examination for Austin Weaver.  Hearing/Vision screen Hearing Screening - Comments:: No concerns  Vision Screening - Comments:: Annual eye exams at [facility]- [name]  Dietary issues and exercise activities  discussed: Current Exercise Habits: Home exercise routine, Type of exercise: walking, Time (Minutes): 10, Frequency (Times/Week): 7, Weekly Exercise (Minutes/Week): 70, Intensity: Mild, Exercise limited by: neurologic condition(s)   Goals Addressed             This Visit's Progress    Patient Stated       Continue regular walking      Depression Screen    07/11/2022   11:47 AM 05/28/2022   11:18 AM 05/28/2022   11:16 AM 05/01/2021   10:14 AM 01/11/2020   11:17 AM 12/01/2018   10:55 AM  PHQ 2/9  Scores  PHQ - 2 Score 0 0 0 0 0 2  PHQ- 9 Score      8    Fall Risk    07/11/2022   11:05 AM  Fall Risk   Falls in the past year? 0  Number falls in past yr: 0    FALL RISK PREVENTION PERTAINING TO THE HOME:  Any stairs in or around the home? Yes  If so, are there any without handrails? Yes  Home free of loose throw rugs in walkways, pet beds, electrical cords, etc? Yes  Adequate lighting in your home to reduce risk of falls? Yes   ASSISTIVE DEVICES UTILIZED TO PREVENT FALLS:  Life alert? No  Use of a cane, walker or w/c? No   Cognitive Function:          07/11/2022   11:16 AM  6CIT Screen  What Year? 4 points  What month? 3 points  What time? 0 points  Count back from 20 2 points  Months in reverse 4 points  Repeat phrase 8 points  Total Score 21 points    Immunizations  There is no immunization history on file for this patient.  TDAP status: Due, Education has been provided regarding the importance of this vaccine. Advised may receive this vaccine at local pharmacy or Health Dept. Aware to provide a copy of the vaccination record if obtained from local pharmacy or Health Dept. Verbalized acceptance and understanding.  Flu Vaccine status: Declined, Education has been provided regarding the importance of this vaccine but patient still declined. Advised may receive this vaccine at local pharmacy or Health Dept. Aware to provide a copy of the vaccination record if  obtained from local pharmacy or Health Dept. Verbalized acceptance and understanding.  Pneumococcal vaccine status: Due, Education has been provided regarding the importance of this vaccine. Advised may receive this vaccine at local pharmacy or Health Dept. Aware to provide a copy of the vaccination record if obtained from local pharmacy or Health Dept. Verbalized acceptance and understanding.  Covid-19 vaccine status: Information provided on how to obtain vaccines.   Qualifies for Shingles Vaccine? Yes   Zostavax completed  unknown   Shingrix Completed?: No.    Education has been provided regarding the importance of this vaccine. Patient has been advised to call insurance company to determine out of pocket expense if they have not yet received this vaccine. Advised may also receive vaccine at local pharmacy or Health Dept. Verbalized acceptance and understanding.  Screening Tests Health Maintenance  Topic Date Due   COVID-19 Vaccine (1) Never done   COLON CANCER SCREENING ANNUAL FOBT  Never done   INFLUENZA VACCINE  Never done   Zoster Vaccines- Shingrix (1 of 2) 08/28/2022 (Originally 10/18/1997)   Pneumonia Vaccine 81+ Years old (1 - PCV) 05/29/2023 (Originally 10/18/1951)   TETANUS/TDAP  05/29/2023 (Originally 10/18/1964)   Hepatitis C Screening  Completed   HPV VACCINES  Aged Out   COLONOSCOPY (Pts 45-27yrs Insurance coverage will need to be confirmed)  Discontinued    Health Maintenance  Health Maintenance Due  Topic Date Due   COVID-19 Vaccine (1) Never done   COLON CANCER SCREENING ANNUAL FOBT  Never done   INFLUENZA VACCINE  Never done    Colon cancer screening - FOBT schedule   Lung Cancer Screening: (Low Dose CT Chest recommended if Age 81-80 years, 30 pack-year currently smoking OR have quit w/in 15years.) does not qualify.   Lung Cancer Screening Referral: n/a  Additional  Screening:  Hepatitis C Screening: does qualify; Completed 2022  Vision Screening:  Recommended annual ophthalmology exams for early detection of glaucoma and other disorders of the eye. Is the patient up to date with their annual eye exam?  Yes    Dental Screening: Recommended annual dental exams for proper oral hygiene  Community Resource Referral / Chronic Care Management: CRR required this visit?  No   CCM required this visit?  No      Plan:     Problem List Items Addressed This Visit       Cardiovascular and Mediastinum   Essential hypertension    Controlled. Cont amlodipine 10 mg      Relevant Medications   amLODipine (NORVASC) 10 MG tablet     Nervous and Auditory   Alzheimer's dementia with behavioral disturbance (HCC)    His 6 CIT is severely elevated.  He does seem to have support at home. Advised considering neurology to slow progression. Referral placed.       Relevant Orders   Ambulatory referral to Neurology   Other Visit Diagnoses     Encounter for Medicare annual wellness exam    -  Primary        I have personally reviewed and noted the following in the patient's chart:   Medical and social history Use of alcohol, tobacco or illicit drugs  Current medications and supplements including opioid prescriptions. Patient is not currently taking opioid prescriptions. Functional ability and status Nutritional status Physical activity Advanced directives List of other physicians Hospitalizations, surgeries, and ER visits in previous 12 months Vitals Screenings to include cognitive, depression, and falls Referrals and appointments  In addition, I have reviewed and discussed with patient certain preventive protocols, quality metrics, and best practice recommendations. A written personalized care plan for preventive services as well as general preventive health recommendations were provided to patient.     [name], MD   07/11/2022

## 2022-07-11 NOTE — Patient Instructions (Addendum)
[facility]) [email or web address] [address] [place], [place] [address]  ~14.9 mi [phone]  #Referral I have placed a referral to a specialist for you. You should receive a phone call from the specialty office. Make sure your voicemail is not full and that if you are able to answer your phone to unknown or new numbers.   It may take up to 2 weeks to hear about the referral. If you do not hear anything in 2 weeks, please call our office and ask to speak with the referral coordinator.  - Neurology  Would recommend the following Vaccines  - Flu shot - Pneumonia Shot  At the pharmacy: Shingles, TDaP, Covid

## 2022-10-13 ENCOUNTER — Encounter: Payer: Medicare HMO | Admitting: Family

## 2023-01-06 ENCOUNTER — Telehealth: Payer: Self-pay

## 2023-01-06 NOTE — Telephone Encounter (Signed)
Received call from Adult services following up on release sent over on 3/28. Wanted to know if has been received call back number [phone].

## 2023-01-08 ENCOUNTER — Telehealth: Payer: Self-pay | Admitting: Family Medicine

## 2023-01-08 NOTE — Telephone Encounter (Signed)
Patient son Legrand Como called in and stated that he will be bringing by his dads power of attorney paperwork. He stated that he has some questions and concerns in regards to his dads dementia. He has a TOC appointment with [place] in June. He can be reached at [phone]. Thank you!

## 2023-01-12 NOTE — Telephone Encounter (Signed)
Adult services called our office today following up on this request. Gave the number for medical records dept.

## 2023-01-12 NOTE — Telephone Encounter (Signed)
Thank you Amy, I agree with your recommendations especially as son is not on DPR. Will see pt as scheduled in June.

## 2023-01-12 NOTE — Telephone Encounter (Signed)
Spoke to Austin Weaver son, who brought Medical POA documents in on 4.5.24. Son is not currently on DPR, [name] (Mr. Truitt girlfriend) and her daughter Coy Saunas, who is [name]'s half sister) are listed from 2020 Advised son to reach out to social services in [employer] as well has his lawyer to discuss limitations on what his dad can sign and be legally held responsible for Also advised son to work with [name] and Coy Saunas to determine how to handle living issues and arrangements for the patient

## 2023-01-30 NOTE — Telephone Encounter (Signed)
Austin Weaver, his son Russella Dar, and daughter Coy Saunas came into the office today requesting a letter of competency for their father in regards to their court date on May 1st. DPR updated to add Russella Dar his son.   The family was made aware that this appointment may not result in a notation, as I could not speak to what the NP would personally be able to do  Patient and family advised to arrive 15 minutes early for their appointment, bringing current insurance cards and ID cards for check-in Also provided patient and family with his current medication list to review and advised to bring any medication he is taking with him.

## 2023-01-30 NOTE — Telephone Encounter (Signed)
I have yet to meet this patient, he is TOC scheduled from DR. Selena Batten so I would be unable to validate competency especially with a diagnoses of alzheimers.   I do suggest he see his neurologist with them who is caring for this diagnoses and he would be better able to assess this status.

## 2023-02-02 NOTE — Telephone Encounter (Signed)
Left message to return call to our office.  

## 2023-02-02 NOTE — Telephone Encounter (Signed)
Sent to Amy initially but I know she has been working Psychologist, sport and exercise.  Pt with appt tomorrow. Can you read below note and relay information?  I can still see him if they persist however he would need to truly be evaluated by his neurologist.

## 2023-02-03 ENCOUNTER — Ambulatory Visit: Payer: Medicare HMO | Admitting: Family

## 2023-02-03 NOTE — Telephone Encounter (Signed)
Family called back, wanted to know what date the diagnosis of dementia was made and on, advised family that I am not a medical provider and am unable to determine the why or how. I did share with the following:  Diagnosis was made on 10.6.23 Rosemary remembered that she was at that appointment.  Nervous and Auditory     Alzheimer's dementia with behavioral disturbance (HCC)      His 6 CIT is severely elevated.  He does seem to have support at home. Advised considering neurology to slow progression. Referral placed.         Relevant Orders    Ambulatory referral to Neurology   I advised the family that since there was no follow-up with Neurology to determine the progression of the illness (although attempts had been made to contact the son to make the appointment).

## 2023-02-03 NOTE — Telephone Encounter (Signed)
Noted  

## 2023-02-03 NOTE — Telephone Encounter (Signed)
Spoke to two family members on [place], Rosemary and Micheal. Informed them that the provider would not be able to write a letter in support of the patient's case as this was the first time she would be seeing the patient.  In addition, an official diagnosis is made by a Neurologist and he was referred back in October 2023 to the doctor below:  Alphonzo Lemmings, MD (Attending) NPI: [id number] [phone] (Work) [phone] (Fax) [phone] [facility] [place], [place] [address] Neurology  An attempt to schedule was made, but the son did not follow-up. I advised Micheal to follow up with the Neurologist to make an appointment. Son states that as of tomorrow his father will be a ward of the state due to a CTS case presented by a neighbor.  I advised the son that I was cancelling the appointment, and gave him the information for Neurology to see if he could make that appointment to follow-up on his dad's care, and to present tomorrow in his case since there would not be a way to get an appointment with Neurology before tomorrow

## 2023-02-17 ENCOUNTER — Telehealth: Payer: Self-pay | Admitting: Family Medicine

## 2023-02-17 NOTE — Telephone Encounter (Signed)
Primitivo Gauze from Adult Protective Service called in and stated that they are needing a FL2 for memory care placement. Thank you!

## 2023-02-18 NOTE — Telephone Encounter (Signed)
Camilla called in again today asking about the FL2.She said that she would really like for someone to call her regarding the [facility] for this patient.

## 2023-02-18 NOTE — Telephone Encounter (Signed)
I don't see where patient has established with Tabitha.

## 2023-02-18 NOTE — Telephone Encounter (Signed)
Patient will need to get this from neurology can we call back Camilla if there is any way to get her phone number? I have never seen this patient.

## 2023-02-18 NOTE — Telephone Encounter (Signed)
Camilla's phone number is under contacts: [phone] I left Camilla a voicemail to find out more information about Tayron Micco  Will follow-up

## 2023-02-20 ENCOUNTER — Ambulatory Visit (INDEPENDENT_AMBULATORY_CARE_PROVIDER_SITE_OTHER): Payer: Medicare HMO | Admitting: Family

## 2023-02-20 ENCOUNTER — Other Ambulatory Visit: Payer: Self-pay | Admitting: Family

## 2023-02-20 ENCOUNTER — Encounter: Payer: Self-pay | Admitting: Family

## 2023-02-20 VITALS — BP 152/80 | HR 83 | Temp 97.7°F | Ht 64.5 in | Wt 162.4 lb

## 2023-02-20 DIAGNOSIS — G309 Alzheimer's disease, unspecified: Secondary | ICD-10-CM

## 2023-02-20 DIAGNOSIS — Z9189 Other specified personal risk factors, not elsewhere classified: Secondary | ICD-10-CM | POA: Diagnosis not present

## 2023-02-20 DIAGNOSIS — I10 Essential (primary) hypertension: Secondary | ICD-10-CM

## 2023-02-20 DIAGNOSIS — E782 Mixed hyperlipidemia: Secondary | ICD-10-CM | POA: Diagnosis not present

## 2023-02-20 DIAGNOSIS — I639 Cerebral infarction, unspecified: Secondary | ICD-10-CM | POA: Diagnosis not present

## 2023-02-20 DIAGNOSIS — F17210 Nicotine dependence, cigarettes, uncomplicated: Secondary | ICD-10-CM

## 2023-02-20 DIAGNOSIS — F02818 Dementia in other diseases classified elsewhere, unspecified severity, with other behavioral disturbance: Secondary | ICD-10-CM

## 2023-02-20 DIAGNOSIS — R531 Weakness: Secondary | ICD-10-CM

## 2023-02-20 DIAGNOSIS — R0989 Other specified symptoms and signs involving the circulatory and respiratory systems: Secondary | ICD-10-CM | POA: Diagnosis not present

## 2023-02-20 DIAGNOSIS — Z789 Other specified health status: Secondary | ICD-10-CM

## 2023-02-20 DIAGNOSIS — Z111 Encounter for screening for respiratory tuberculosis: Secondary | ICD-10-CM | POA: Insufficient documentation

## 2023-02-20 DIAGNOSIS — E538 Deficiency of other specified B group vitamins: Secondary | ICD-10-CM

## 2023-02-20 DIAGNOSIS — H539 Unspecified visual disturbance: Secondary | ICD-10-CM | POA: Insufficient documentation

## 2023-02-20 DIAGNOSIS — R809 Proteinuria, unspecified: Secondary | ICD-10-CM

## 2023-02-20 DIAGNOSIS — R600 Localized edema: Secondary | ICD-10-CM

## 2023-02-20 LAB — LIPID PANEL
Cholesterol: 150 mg/dL (ref 0–200)
HDL: 49.6 mg/dL (ref >39.00)
LDL Cholesterol: 81 mg/dL (ref 0–99)
NonHDL: 100.3
Total CHOL/HDL Ratio: 3
Triglycerides: 98 mg/dL (ref 0.0–149.0)
VLDL: 19.6 mg/dL (ref 0.0–40.0)

## 2023-02-20 LAB — CBC
HCT: 51.2 % (ref 39.0–52.0)
Hemoglobin: 17.2 g/dL — ABNORMAL HIGH (ref 13.0–17.0)
MCHC: 33.7 g/dL (ref 30.0–36.0)
MCV: 92.3 fl (ref 78.0–100.0)
Platelets: 148 10*3/uL — ABNORMAL LOW (ref 150.0–400.0)
RBC: 5.55 Mil/uL (ref 4.22–5.81)
RDW: 15.5 % (ref 11.5–15.5)
WBC: 6.3 10*3/uL (ref 4.0–10.5)

## 2023-02-20 LAB — MICROALBUMIN / CREATININE URINE RATIO
Creatinine,U: 93.2 mg/dL
Microalb Creat Ratio: 2.5 mg/g (ref 0.0–30.0)
Microalb, Ur: 2.4 mg/dL — ABNORMAL HIGH (ref 0.0–1.9)

## 2023-02-20 LAB — BASIC METABOLIC PANEL
BUN: 13 mg/dL (ref 6–23)
CO2: 27 mEq/L (ref 19–32)
Calcium: 9.5 mg/dL (ref 8.4–10.5)
Chloride: 104 mEq/L (ref 96–112)
Creatinine, Ser: 1.07 mg/dL (ref 0.40–1.50)
GFR: 67.96 mL/min (ref >60.00)
Glucose, Bld: 76 mg/dL (ref 70–99)
Potassium: 4 mEq/L (ref 3.5–5.1)
Sodium: 139 mEq/L (ref 135–145)

## 2023-02-20 LAB — HEMOGLOBIN A1C: Hgb A1c MFr Bld: 5.3 % (ref 4.6–6.5)

## 2023-02-20 MED ORDER — LOSARTAN POTASSIUM 25 MG PO TABS: 25.0000 mg | ORAL_TABLET | Freq: Every day | ORAL | 3 refills | Status: DC

## 2023-02-20 MED ORDER — AMLODIPINE BESYLATE 10 MG PO TABS: 10.0000 mg | ORAL_TABLET | Freq: Every day | ORAL | 3 refills | Status: DC

## 2023-02-20 MED ORDER — ATORVASTATIN CALCIUM 40 MG PO TABS: 40.0000 mg | ORAL_TABLET | Freq: Every day | ORAL | 3 refills | Status: AC

## 2023-02-20 NOTE — Progress Notes (Signed)
Platelet mildly low however stable. Will continue to monitor.  Microalbumin positive. We need to start medication called losartan to further protect the kidneys. Sending to pharmacy.

## 2023-02-20 NOTE — Assessment & Plan Note (Signed)
Smoking cessation instruction/counseling given:  counseled patient on the dangers of tobacco use, advised patient to stop smoking, and reviewed strategies to maximize success 

## 2023-02-20 NOTE — Assessment & Plan Note (Signed)
On assessment  Referral to vascular for eval/treat

## 2023-02-20 NOTE — Assessment & Plan Note (Signed)
Wear compression stockings, elevate feet May be vascular, referring to vascular surgeon for eval

## 2023-02-20 NOTE — Assessment & Plan Note (Signed)
Ordered lipid panel, pending results. Work on low cholesterol diet and exercise as tolerated Restart atorvastatin, ran out of medication. Sent in RX for refill

## 2023-02-20 NOTE — Assessment & Plan Note (Signed)
Elevated today, off medication Refill amlodipine  Advised to have monitored daily goal of <130/90  Maintain low sodium diet

## 2023-02-20 NOTE — Assessment & Plan Note (Signed)
Recommendation for physical therapy  Advised to use cane more often

## 2023-02-20 NOTE — Progress Notes (Signed)
Can we add b12?
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
## 2023-02-20 NOTE — Assessment & Plan Note (Signed)
Auscultated on exam  Ordering u/s bil carotids

## 2023-02-20 NOTE — Assessment & Plan Note (Signed)
Confirmed dx dating back to my records of ER visit 2019 and visit with Dr. Lucia Gaskins, partial note in 2019.  Seeing neurologist for new consult /eval/treat  MMSE completed today, 22/30

## 2023-02-20 NOTE — Assessment & Plan Note (Signed)
Overdue for eye appt recommend seeing eye doctor

## 2023-03-03 DIAGNOSIS — F02818 Dementia in other diseases classified elsewhere, unspecified severity, with other behavioral disturbance: Secondary | ICD-10-CM | POA: Diagnosis not present

## 2023-03-03 DIAGNOSIS — G309 Alzheimer's disease, unspecified: Secondary | ICD-10-CM | POA: Diagnosis not present

## 2023-03-03 DIAGNOSIS — Z7689 Persons encountering health services in other specified circumstances: Secondary | ICD-10-CM | POA: Diagnosis not present

## 2023-03-19 DIAGNOSIS — I639 Cerebral infarction, unspecified: Secondary | ICD-10-CM | POA: Diagnosis not present

## 2023-03-19 DIAGNOSIS — I10 Essential (primary) hypertension: Secondary | ICD-10-CM | POA: Diagnosis not present

## 2023-03-19 DIAGNOSIS — M6281 Muscle weakness (generalized): Secondary | ICD-10-CM | POA: Diagnosis not present

## 2023-03-19 DIAGNOSIS — G309 Alzheimer's disease, unspecified: Secondary | ICD-10-CM | POA: Diagnosis not present

## 2023-03-20 DIAGNOSIS — I639 Cerebral infarction, unspecified: Secondary | ICD-10-CM | POA: Diagnosis not present

## 2023-03-20 DIAGNOSIS — G309 Alzheimer's disease, unspecified: Secondary | ICD-10-CM | POA: Diagnosis not present

## 2023-03-20 DIAGNOSIS — M6281 Muscle weakness (generalized): Secondary | ICD-10-CM | POA: Diagnosis not present

## 2023-03-20 DIAGNOSIS — I10 Essential (primary) hypertension: Secondary | ICD-10-CM | POA: Diagnosis not present

## 2023-03-23 DIAGNOSIS — G309 Alzheimer's disease, unspecified: Secondary | ICD-10-CM | POA: Diagnosis not present

## 2023-03-23 DIAGNOSIS — I69354 Hemiplegia and hemiparesis following cerebral infarction affecting left non-dominant side: Secondary | ICD-10-CM | POA: Diagnosis not present

## 2023-03-23 DIAGNOSIS — M6281 Muscle weakness (generalized): Secondary | ICD-10-CM | POA: Diagnosis not present

## 2023-03-23 DIAGNOSIS — I639 Cerebral infarction, unspecified: Secondary | ICD-10-CM | POA: Diagnosis not present

## 2023-03-23 DIAGNOSIS — E785 Hyperlipidemia, unspecified: Secondary | ICD-10-CM | POA: Diagnosis not present

## 2023-03-23 DIAGNOSIS — F17200 Nicotine dependence, unspecified, uncomplicated: Secondary | ICD-10-CM | POA: Diagnosis not present

## 2023-03-23 DIAGNOSIS — I10 Essential (primary) hypertension: Secondary | ICD-10-CM | POA: Diagnosis not present

## 2023-03-24 DIAGNOSIS — I639 Cerebral infarction, unspecified: Secondary | ICD-10-CM | POA: Diagnosis not present

## 2023-03-24 DIAGNOSIS — M6281 Muscle weakness (generalized): Secondary | ICD-10-CM | POA: Diagnosis not present

## 2023-03-24 DIAGNOSIS — I10 Essential (primary) hypertension: Secondary | ICD-10-CM | POA: Diagnosis not present

## 2023-03-24 DIAGNOSIS — G309 Alzheimer's disease, unspecified: Secondary | ICD-10-CM | POA: Diagnosis not present

## 2023-03-25 ENCOUNTER — Encounter: Payer: Medicare HMO | Admitting: Family

## 2023-03-25 DIAGNOSIS — I639 Cerebral infarction, unspecified: Secondary | ICD-10-CM | POA: Diagnosis not present

## 2023-03-25 DIAGNOSIS — M6281 Muscle weakness (generalized): Secondary | ICD-10-CM | POA: Diagnosis not present

## 2023-03-25 DIAGNOSIS — G309 Alzheimer's disease, unspecified: Secondary | ICD-10-CM | POA: Diagnosis not present

## 2023-03-25 DIAGNOSIS — I10 Essential (primary) hypertension: Secondary | ICD-10-CM | POA: Diagnosis not present

## 2023-03-26 ENCOUNTER — Encounter: Payer: Self-pay | Admitting: Family

## 2023-03-26 ENCOUNTER — Other Ambulatory Visit (INDEPENDENT_AMBULATORY_CARE_PROVIDER_SITE_OTHER): Payer: Self-pay | Admitting: Nurse Practitioner

## 2023-03-26 DIAGNOSIS — R0989 Other specified symptoms and signs involving the circulatory and respiratory systems: Secondary | ICD-10-CM

## 2023-03-26 DIAGNOSIS — I639 Cerebral infarction, unspecified: Secondary | ICD-10-CM | POA: Diagnosis not present

## 2023-03-26 DIAGNOSIS — M6281 Muscle weakness (generalized): Secondary | ICD-10-CM | POA: Diagnosis not present

## 2023-03-26 DIAGNOSIS — G309 Alzheimer's disease, unspecified: Secondary | ICD-10-CM | POA: Diagnosis not present

## 2023-03-26 DIAGNOSIS — I10 Essential (primary) hypertension: Secondary | ICD-10-CM | POA: Diagnosis not present

## 2023-03-27 DIAGNOSIS — G309 Alzheimer's disease, unspecified: Secondary | ICD-10-CM | POA: Diagnosis not present

## 2023-03-27 DIAGNOSIS — M6281 Muscle weakness (generalized): Secondary | ICD-10-CM | POA: Diagnosis not present

## 2023-03-27 DIAGNOSIS — I639 Cerebral infarction, unspecified: Secondary | ICD-10-CM | POA: Diagnosis not present

## 2023-03-27 DIAGNOSIS — I10 Essential (primary) hypertension: Secondary | ICD-10-CM | POA: Diagnosis not present

## 2023-03-29 DIAGNOSIS — I639 Cerebral infarction, unspecified: Secondary | ICD-10-CM | POA: Diagnosis not present

## 2023-03-29 DIAGNOSIS — I10 Essential (primary) hypertension: Secondary | ICD-10-CM | POA: Diagnosis not present

## 2023-03-29 DIAGNOSIS — M6281 Muscle weakness (generalized): Secondary | ICD-10-CM | POA: Diagnosis not present

## 2023-03-29 DIAGNOSIS — G309 Alzheimer's disease, unspecified: Secondary | ICD-10-CM | POA: Diagnosis not present

## 2023-03-30 DIAGNOSIS — I10 Essential (primary) hypertension: Secondary | ICD-10-CM | POA: Diagnosis not present

## 2023-03-30 DIAGNOSIS — G309 Alzheimer's disease, unspecified: Secondary | ICD-10-CM | POA: Diagnosis not present

## 2023-03-30 DIAGNOSIS — I639 Cerebral infarction, unspecified: Secondary | ICD-10-CM | POA: Diagnosis not present

## 2023-03-30 DIAGNOSIS — M6281 Muscle weakness (generalized): Secondary | ICD-10-CM | POA: Diagnosis not present

## 2023-03-31 DIAGNOSIS — I639 Cerebral infarction, unspecified: Secondary | ICD-10-CM | POA: Diagnosis not present

## 2023-03-31 DIAGNOSIS — M6281 Muscle weakness (generalized): Secondary | ICD-10-CM | POA: Diagnosis not present

## 2023-03-31 DIAGNOSIS — G309 Alzheimer's disease, unspecified: Secondary | ICD-10-CM | POA: Diagnosis not present

## 2023-03-31 DIAGNOSIS — I10 Essential (primary) hypertension: Secondary | ICD-10-CM | POA: Diagnosis not present

## 2023-04-01 ENCOUNTER — Encounter (INDEPENDENT_AMBULATORY_CARE_PROVIDER_SITE_OTHER): Payer: Medicare HMO

## 2023-04-01 ENCOUNTER — Encounter (INDEPENDENT_AMBULATORY_CARE_PROVIDER_SITE_OTHER): Payer: Medicare HMO | Admitting: Nurse Practitioner

## 2023-04-01 DIAGNOSIS — I639 Cerebral infarction, unspecified: Secondary | ICD-10-CM | POA: Diagnosis not present

## 2023-04-01 DIAGNOSIS — M6281 Muscle weakness (generalized): Secondary | ICD-10-CM | POA: Diagnosis not present

## 2023-04-01 DIAGNOSIS — G309 Alzheimer's disease, unspecified: Secondary | ICD-10-CM | POA: Diagnosis not present

## 2023-04-01 DIAGNOSIS — I10 Essential (primary) hypertension: Secondary | ICD-10-CM | POA: Diagnosis not present

## 2023-04-02 DIAGNOSIS — G309 Alzheimer's disease, unspecified: Secondary | ICD-10-CM | POA: Diagnosis not present

## 2023-04-02 DIAGNOSIS — I10 Essential (primary) hypertension: Secondary | ICD-10-CM | POA: Diagnosis not present

## 2023-04-02 DIAGNOSIS — M6281 Muscle weakness (generalized): Secondary | ICD-10-CM | POA: Diagnosis not present

## 2023-04-02 DIAGNOSIS — I639 Cerebral infarction, unspecified: Secondary | ICD-10-CM | POA: Diagnosis not present

## 2023-04-03 DIAGNOSIS — I10 Essential (primary) hypertension: Secondary | ICD-10-CM | POA: Diagnosis not present

## 2023-04-03 DIAGNOSIS — I639 Cerebral infarction, unspecified: Secondary | ICD-10-CM | POA: Diagnosis not present

## 2023-04-03 DIAGNOSIS — M6281 Muscle weakness (generalized): Secondary | ICD-10-CM | POA: Diagnosis not present

## 2023-04-03 DIAGNOSIS — G309 Alzheimer's disease, unspecified: Secondary | ICD-10-CM | POA: Diagnosis not present

## 2023-04-05 DIAGNOSIS — G309 Alzheimer's disease, unspecified: Secondary | ICD-10-CM | POA: Diagnosis not present

## 2023-04-05 DIAGNOSIS — I639 Cerebral infarction, unspecified: Secondary | ICD-10-CM | POA: Diagnosis not present

## 2023-04-05 DIAGNOSIS — I10 Essential (primary) hypertension: Secondary | ICD-10-CM | POA: Diagnosis not present

## 2023-04-05 DIAGNOSIS — M6281 Muscle weakness (generalized): Secondary | ICD-10-CM | POA: Diagnosis not present

## 2023-04-06 DIAGNOSIS — M6281 Muscle weakness (generalized): Secondary | ICD-10-CM | POA: Diagnosis not present

## 2023-04-06 DIAGNOSIS — G309 Alzheimer's disease, unspecified: Secondary | ICD-10-CM | POA: Diagnosis not present

## 2023-04-06 DIAGNOSIS — I10 Essential (primary) hypertension: Secondary | ICD-10-CM | POA: Diagnosis not present

## 2023-04-06 DIAGNOSIS — I639 Cerebral infarction, unspecified: Secondary | ICD-10-CM | POA: Diagnosis not present

## 2023-04-07 DIAGNOSIS — G309 Alzheimer's disease, unspecified: Secondary | ICD-10-CM | POA: Diagnosis not present

## 2023-04-07 DIAGNOSIS — I639 Cerebral infarction, unspecified: Secondary | ICD-10-CM | POA: Diagnosis not present

## 2023-04-07 DIAGNOSIS — M6281 Muscle weakness (generalized): Secondary | ICD-10-CM | POA: Diagnosis not present

## 2023-04-07 DIAGNOSIS — R269 Unspecified abnormalities of gait and mobility: Secondary | ICD-10-CM | POA: Diagnosis not present

## 2023-04-07 DIAGNOSIS — I69354 Hemiplegia and hemiparesis following cerebral infarction affecting left non-dominant side: Secondary | ICD-10-CM | POA: Diagnosis not present

## 2023-04-07 DIAGNOSIS — I10 Essential (primary) hypertension: Secondary | ICD-10-CM | POA: Diagnosis not present

## 2023-04-08 DIAGNOSIS — G309 Alzheimer's disease, unspecified: Secondary | ICD-10-CM | POA: Diagnosis not present

## 2023-04-08 DIAGNOSIS — I639 Cerebral infarction, unspecified: Secondary | ICD-10-CM | POA: Diagnosis not present

## 2023-04-08 DIAGNOSIS — I10 Essential (primary) hypertension: Secondary | ICD-10-CM | POA: Diagnosis not present

## 2023-04-08 DIAGNOSIS — M6281 Muscle weakness (generalized): Secondary | ICD-10-CM | POA: Diagnosis not present

## 2023-04-09 DIAGNOSIS — I639 Cerebral infarction, unspecified: Secondary | ICD-10-CM | POA: Diagnosis not present

## 2023-04-09 DIAGNOSIS — I10 Essential (primary) hypertension: Secondary | ICD-10-CM | POA: Diagnosis not present

## 2023-04-09 DIAGNOSIS — G309 Alzheimer's disease, unspecified: Secondary | ICD-10-CM | POA: Diagnosis not present

## 2023-04-09 DIAGNOSIS — M6281 Muscle weakness (generalized): Secondary | ICD-10-CM | POA: Diagnosis not present

## 2023-04-10 DIAGNOSIS — M6281 Muscle weakness (generalized): Secondary | ICD-10-CM | POA: Diagnosis not present

## 2023-04-10 DIAGNOSIS — I10 Essential (primary) hypertension: Secondary | ICD-10-CM | POA: Diagnosis not present

## 2023-04-10 DIAGNOSIS — G309 Alzheimer's disease, unspecified: Secondary | ICD-10-CM | POA: Diagnosis not present

## 2023-04-10 DIAGNOSIS — I639 Cerebral infarction, unspecified: Secondary | ICD-10-CM | POA: Diagnosis not present

## 2023-04-13 DIAGNOSIS — M6281 Muscle weakness (generalized): Secondary | ICD-10-CM | POA: Diagnosis not present

## 2023-04-13 DIAGNOSIS — I639 Cerebral infarction, unspecified: Secondary | ICD-10-CM | POA: Diagnosis not present

## 2023-04-13 DIAGNOSIS — G309 Alzheimer's disease, unspecified: Secondary | ICD-10-CM | POA: Diagnosis not present

## 2023-04-13 DIAGNOSIS — I10 Essential (primary) hypertension: Secondary | ICD-10-CM | POA: Diagnosis not present

## 2023-04-14 DIAGNOSIS — M6281 Muscle weakness (generalized): Secondary | ICD-10-CM | POA: Diagnosis not present

## 2023-04-14 DIAGNOSIS — I10 Essential (primary) hypertension: Secondary | ICD-10-CM | POA: Diagnosis not present

## 2023-04-14 DIAGNOSIS — G309 Alzheimer's disease, unspecified: Secondary | ICD-10-CM | POA: Diagnosis not present

## 2023-04-14 DIAGNOSIS — I639 Cerebral infarction, unspecified: Secondary | ICD-10-CM | POA: Diagnosis not present

## 2023-04-15 DIAGNOSIS — G309 Alzheimer's disease, unspecified: Secondary | ICD-10-CM | POA: Diagnosis not present

## 2023-04-15 DIAGNOSIS — M6281 Muscle weakness (generalized): Secondary | ICD-10-CM | POA: Diagnosis not present

## 2023-04-15 DIAGNOSIS — I10 Essential (primary) hypertension: Secondary | ICD-10-CM | POA: Diagnosis not present

## 2023-04-15 DIAGNOSIS — I639 Cerebral infarction, unspecified: Secondary | ICD-10-CM | POA: Diagnosis not present

## 2023-04-16 DIAGNOSIS — M6281 Muscle weakness (generalized): Secondary | ICD-10-CM | POA: Diagnosis not present

## 2023-04-16 DIAGNOSIS — I639 Cerebral infarction, unspecified: Secondary | ICD-10-CM | POA: Diagnosis not present

## 2023-04-16 DIAGNOSIS — G309 Alzheimer's disease, unspecified: Secondary | ICD-10-CM | POA: Diagnosis not present

## 2023-04-16 DIAGNOSIS — I10 Essential (primary) hypertension: Secondary | ICD-10-CM | POA: Diagnosis not present

## 2023-05-04 DIAGNOSIS — I10 Essential (primary) hypertension: Secondary | ICD-10-CM | POA: Diagnosis not present

## 2023-05-04 DIAGNOSIS — F17200 Nicotine dependence, unspecified, uncomplicated: Secondary | ICD-10-CM | POA: Diagnosis not present

## 2023-05-04 DIAGNOSIS — E785 Hyperlipidemia, unspecified: Secondary | ICD-10-CM | POA: Diagnosis not present

## 2023-05-04 DIAGNOSIS — I69354 Hemiplegia and hemiparesis following cerebral infarction affecting left non-dominant side: Secondary | ICD-10-CM | POA: Diagnosis not present

## 2023-05-12 DIAGNOSIS — F0280 Dementia in other diseases classified elsewhere without behavioral disturbance: Secondary | ICD-10-CM | POA: Diagnosis not present

## 2023-05-12 DIAGNOSIS — I10 Essential (primary) hypertension: Secondary | ICD-10-CM | POA: Diagnosis not present

## 2023-05-12 DIAGNOSIS — G309 Alzheimer's disease, unspecified: Secondary | ICD-10-CM | POA: Diagnosis not present

## 2023-05-12 DIAGNOSIS — I69354 Hemiplegia and hemiparesis following cerebral infarction affecting left non-dominant side: Secondary | ICD-10-CM | POA: Diagnosis not present

## 2023-05-12 DIAGNOSIS — E785 Hyperlipidemia, unspecified: Secondary | ICD-10-CM | POA: Diagnosis not present

## 2023-05-12 DIAGNOSIS — F17201 Nicotine dependence, unspecified, in remission: Secondary | ICD-10-CM | POA: Diagnosis not present

## 2023-05-12 DIAGNOSIS — Z7982 Long term (current) use of aspirin: Secondary | ICD-10-CM | POA: Diagnosis not present

## 2023-05-17 ENCOUNTER — Inpatient Hospital Stay (HOSPITAL_COMMUNITY)
Admission: EM | Admit: 2023-05-17 | Discharge: 2023-05-21 | DRG: 871 | Disposition: A | Discharge status: Skilled Nursing Facility | Payer: Medicare HMO | Source: Skilled Nursing Facility | Attending: Family Medicine | Admitting: Family Medicine

## 2023-05-17 ENCOUNTER — Encounter (HOSPITAL_COMMUNITY): Payer: Self-pay

## 2023-05-17 ENCOUNTER — Emergency Department (HOSPITAL_COMMUNITY): Payer: Medicare HMO

## 2023-05-17 DIAGNOSIS — G9341 Metabolic encephalopathy: Secondary | ICD-10-CM | POA: Diagnosis present

## 2023-05-17 DIAGNOSIS — R000 Tachycardia, unspecified: Secondary | ICD-10-CM | POA: Diagnosis not present

## 2023-05-17 DIAGNOSIS — R4182 Altered mental status, unspecified: Secondary | ICD-10-CM | POA: Diagnosis not present

## 2023-05-17 DIAGNOSIS — I10 Essential (primary) hypertension: Secondary | ICD-10-CM | POA: Diagnosis present

## 2023-05-17 DIAGNOSIS — F0280 Dementia in other diseases classified elsewhere without behavioral disturbance: Secondary | ICD-10-CM | POA: Diagnosis not present

## 2023-05-17 DIAGNOSIS — R918 Other nonspecific abnormal finding of lung field: Secondary | ICD-10-CM | POA: Diagnosis not present

## 2023-05-17 DIAGNOSIS — R569 Unspecified convulsions: Secondary | ICD-10-CM | POA: Diagnosis not present

## 2023-05-17 DIAGNOSIS — Z833 Family history of diabetes mellitus: Secondary | ICD-10-CM

## 2023-05-17 DIAGNOSIS — E782 Mixed hyperlipidemia: Secondary | ICD-10-CM | POA: Diagnosis present

## 2023-05-17 DIAGNOSIS — J984 Other disorders of lung: Secondary | ICD-10-CM | POA: Diagnosis not present

## 2023-05-17 DIAGNOSIS — I77810 Thoracic aortic ectasia: Secondary | ICD-10-CM | POA: Diagnosis not present

## 2023-05-17 DIAGNOSIS — J324 Chronic pansinusitis: Secondary | ICD-10-CM | POA: Diagnosis not present

## 2023-05-17 DIAGNOSIS — R531 Weakness: Secondary | ICD-10-CM | POA: Diagnosis present

## 2023-05-17 DIAGNOSIS — E876 Hypokalemia: Secondary | ICD-10-CM | POA: Diagnosis not present

## 2023-05-17 DIAGNOSIS — I779 Disorder of arteries and arterioles, unspecified: Secondary | ICD-10-CM | POA: Diagnosis not present

## 2023-05-17 DIAGNOSIS — Z7982 Long term (current) use of aspirin: Secondary | ICD-10-CM | POA: Diagnosis not present

## 2023-05-17 DIAGNOSIS — R6520 Severe sepsis without septic shock: Secondary | ICD-10-CM | POA: Diagnosis present

## 2023-05-17 DIAGNOSIS — R471 Dysarthria and anarthria: Secondary | ICD-10-CM | POA: Diagnosis present

## 2023-05-17 DIAGNOSIS — L03116 Cellulitis of left lower limb: Secondary | ICD-10-CM | POA: Diagnosis present

## 2023-05-17 DIAGNOSIS — B351 Tinea unguium: Secondary | ICD-10-CM | POA: Diagnosis present

## 2023-05-17 DIAGNOSIS — F17210 Nicotine dependence, cigarettes, uncomplicated: Secondary | ICD-10-CM | POA: Diagnosis present

## 2023-05-17 DIAGNOSIS — Z8673 Personal history of transient ischemic attack (TIA), and cerebral infarction without residual deficits: Secondary | ICD-10-CM

## 2023-05-17 DIAGNOSIS — R32 Unspecified urinary incontinence: Secondary | ICD-10-CM | POA: Diagnosis present

## 2023-05-17 DIAGNOSIS — R29810 Facial weakness: Secondary | ICD-10-CM | POA: Diagnosis present

## 2023-05-17 DIAGNOSIS — E871 Hypo-osmolality and hyponatremia: Secondary | ICD-10-CM | POA: Diagnosis not present

## 2023-05-17 DIAGNOSIS — L03115 Cellulitis of right lower limb: Secondary | ICD-10-CM | POA: Diagnosis present

## 2023-05-17 DIAGNOSIS — Z7401 Bed confinement status: Secondary | ICD-10-CM | POA: Diagnosis not present

## 2023-05-17 DIAGNOSIS — I679 Cerebrovascular disease, unspecified: Secondary | ICD-10-CM | POA: Diagnosis present

## 2023-05-17 DIAGNOSIS — R410 Disorientation, unspecified: Secondary | ICD-10-CM | POA: Diagnosis not present

## 2023-05-17 DIAGNOSIS — E785 Hyperlipidemia, unspecified: Secondary | ICD-10-CM | POA: Diagnosis not present

## 2023-05-17 DIAGNOSIS — Z79899 Other long term (current) drug therapy: Secondary | ICD-10-CM

## 2023-05-17 DIAGNOSIS — R4701 Aphasia: Secondary | ICD-10-CM | POA: Diagnosis present

## 2023-05-17 DIAGNOSIS — G309 Alzheimer's disease, unspecified: Secondary | ICD-10-CM | POA: Diagnosis present

## 2023-05-17 DIAGNOSIS — G459 Transient cerebral ischemic attack, unspecified: Secondary | ICD-10-CM | POA: Diagnosis not present

## 2023-05-17 DIAGNOSIS — I639 Cerebral infarction, unspecified: Secondary | ICD-10-CM | POA: Diagnosis present

## 2023-05-17 DIAGNOSIS — J019 Acute sinusitis, unspecified: Secondary | ICD-10-CM | POA: Diagnosis present

## 2023-05-17 DIAGNOSIS — A419 Sepsis, unspecified organism: Principal | ICD-10-CM | POA: Diagnosis present

## 2023-05-17 DIAGNOSIS — F02818 Dementia in other diseases classified elsewhere, unspecified severity, with other behavioral disturbance: Secondary | ICD-10-CM | POA: Diagnosis present

## 2023-05-17 DIAGNOSIS — Z823 Family history of stroke: Secondary | ICD-10-CM | POA: Diagnosis not present

## 2023-05-17 DIAGNOSIS — I6501 Occlusion and stenosis of right vertebral artery: Secondary | ICD-10-CM | POA: Diagnosis not present

## 2023-05-17 DIAGNOSIS — Z8249 Family history of ischemic heart disease and other diseases of the circulatory system: Secondary | ICD-10-CM | POA: Diagnosis not present

## 2023-05-17 LAB — I-STAT CHEM 8, ED
BUN: 15 mg/dL (ref 8–23)
Calcium, Ion: 1.02 mmol/L — ABNORMAL LOW (ref 1.15–1.40)
Chloride: 101 mmol/L (ref 98–111)
Creatinine, Ser: 0.9 mg/dL (ref 0.61–1.24)
Glucose, Bld: 119 mg/dL — ABNORMAL HIGH (ref 70–99)
HCT: 50 % (ref 39.0–52.0)
Hemoglobin: 17 g/dL (ref 13.0–17.0)
Potassium: 3.5 mmol/L (ref 3.5–5.1)
Sodium: 136 mmol/L (ref 135–145)
TCO2: 22 mmol/L (ref 22–32)

## 2023-05-17 LAB — DIFFERENTIAL
Abs Immature Granulocytes: 0.09 10*3/uL — ABNORMAL HIGH (ref 0.00–0.07)
Basophils Absolute: 0 10*3/uL (ref 0.0–0.1)
Basophils Relative: 0 %
Eosinophils Absolute: 0 10*3/uL (ref 0.0–0.5)
Eosinophils Relative: 0 %
Immature Granulocytes: 1 %
Lymphocytes Relative: 7 %
Lymphs Abs: 0.8 10*3/uL (ref 0.7–4.0)
Monocytes Absolute: 0.9 10*3/uL (ref 0.1–1.0)
Monocytes Relative: 9 %
Neutro Abs: 9.1 10*3/uL — ABNORMAL HIGH (ref 1.7–7.7)
Neutrophils Relative %: 83 %

## 2023-05-17 LAB — RAPID URINE DRUG SCREEN, HOSP PERFORMED
Amphetamines: NOT DETECTED
Barbiturates: NOT DETECTED
Benzodiazepines: NOT DETECTED
Cocaine: NOT DETECTED
Opiates: NOT DETECTED
Tetrahydrocannabinol: NOT DETECTED

## 2023-05-17 LAB — APTT: aPTT: 35 seconds (ref 24–36)

## 2023-05-17 LAB — COMPREHENSIVE METABOLIC PANEL
ALT: 12 U/L (ref 0–44)
AST: 16 U/L (ref 15–41)
Albumin: 3.8 g/dL (ref 3.5–5.0)
Alkaline Phosphatase: 82 U/L (ref 38–126)
Anion gap: 17 — ABNORMAL HIGH (ref 5–15)
BUN: 13 mg/dL (ref 8–23)
CO2: 21 mmol/L — ABNORMAL LOW (ref 22–32)
Calcium: 9.2 mg/dL (ref 8.9–10.3)
Chloride: 98 mmol/L (ref 98–111)
Creatinine, Ser: 1.05 mg/dL (ref 0.61–1.24)
GFR, Estimated: >60 mL/min (ref >60)
Glucose, Bld: 116 mg/dL — ABNORMAL HIGH (ref 70–99)
Potassium: 3.6 mmol/L (ref 3.5–5.1)
Sodium: 136 mmol/L (ref 135–145)
Total Bilirubin: 1.2 mg/dL (ref 0.3–1.2)
Total Protein: 8 g/dL (ref 6.5–8.1)

## 2023-05-17 LAB — URINALYSIS, ROUTINE W REFLEX MICROSCOPIC
Bacteria, UA: NONE SEEN
Bilirubin Urine: NEGATIVE
Glucose, UA: NEGATIVE mg/dL
Ketones, ur: 5 mg/dL — AB
Leukocytes,Ua: NEGATIVE
Nitrite: NEGATIVE
Protein, ur: NEGATIVE mg/dL
Specific Gravity, Urine: 1.014 (ref 1.005–1.030)
pH: 7 (ref 5.0–8.0)

## 2023-05-17 LAB — CBC
HCT: 47.9 % (ref 39.0–52.0)
Hemoglobin: 15.9 g/dL (ref 13.0–17.0)
MCH: 29.2 pg (ref 26.0–34.0)
MCHC: 33.2 g/dL (ref 30.0–36.0)
MCV: 88.1 fL (ref 80.0–100.0)
Platelets: 191 10*3/uL (ref 150–400)
RBC: 5.44 MIL/uL (ref 4.22–5.81)
RDW: 14.6 % (ref 11.5–15.5)
WBC: 10.9 10*3/uL — ABNORMAL HIGH (ref 4.0–10.5)
nRBC: 0 % (ref 0.0–0.2)

## 2023-05-17 LAB — PROTIME-INR
INR: 1.2 (ref 0.8–1.2)
Prothrombin Time: 15.3 seconds — ABNORMAL HIGH (ref 11.4–15.2)

## 2023-05-17 LAB — I-STAT CG4 LACTIC ACID, ED: Lactic Acid, Venous: 1.9 mmol/L (ref 0.5–1.9)

## 2023-05-17 LAB — CBG MONITORING, ED: Glucose-Capillary: 105 mg/dL — ABNORMAL HIGH (ref 70–99)

## 2023-05-17 LAB — ETHANOL: Alcohol, Ethyl (B): <10 mg/dL (ref <10)

## 2023-05-17 MED ORDER — LACTATED RINGERS IV BOLUS (SEPSIS)
250.0000 mL | Freq: Once | INTRAVENOUS | Status: AC
  Administered 2023-05-17: 250 mL via INTRAVENOUS

## 2023-05-17 MED ORDER — SODIUM CHLORIDE 0.9 % IV SOLN
2.0000 g | INTRAVENOUS | Status: DC
  Administered 2023-05-17: 2 g via INTRAVENOUS
  Filled 2023-05-17: qty 20

## 2023-05-17 MED ORDER — LACTATED RINGERS IV BOLUS (SEPSIS)
1000.0000 mL | Freq: Once | INTRAVENOUS | Status: AC
  Administered 2023-05-17: 1000 mL via INTRAVENOUS

## 2023-05-17 MED ORDER — ACETAMINOPHEN 500 MG PO TABS
1000.0000 mg | ORAL_TABLET | Freq: Once | ORAL | Status: AC
  Administered 2023-05-17: 1000 mg via ORAL
  Filled 2023-05-17: qty 2

## 2023-05-17 MED ORDER — LACTATED RINGERS IV SOLN: INTRAVENOUS | Status: DC

## 2023-05-17 MED ORDER — VANCOMYCIN HCL 1250 MG/250ML IV SOLN
1250.0000 mg | INTRAVENOUS | Status: DC
  Administered 2023-05-18 – 2023-05-19 (×2): 1250 mg via INTRAVENOUS
  Filled 2023-05-17 (×2): qty 250

## 2023-05-17 MED ORDER — VANCOMYCIN HCL 1500 MG/300ML IV SOLN
1500.0000 mg | Freq: Once | INTRAVENOUS | Status: AC
  Administered 2023-05-18: 1500 mg via INTRAVENOUS
  Filled 2023-05-17: qty 300

## 2023-05-17 MED ORDER — SODIUM CHLORIDE 0.9 % IV SOLN
2.0000 g | Freq: Three times a day (TID) | INTRAVENOUS | Status: DC
  Administered 2023-05-18 – 2023-05-20 (×6): 2 g via INTRAVENOUS
  Filled 2023-05-17 (×6): qty 12.5

## 2023-05-17 NOTE — ED Notes (Signed)
Pt smells strongly of urine, pt changed and condom cath placed

## 2023-05-17 NOTE — Consult Note (Incomplete)
NEURO HOSPITALIST CONSULT NOTE   Requestig physician: Dr. Doran Durand  Reason for Consult: Acute onset of confusion with right sided weakness and facial droop  History obtained from:  EMS and Chart     HPI:                                                                                                                                          Austin Weaver is an 77 y.o. male with a PMHx of allergy, TIA, prior lacunar strokes and HTN who presents from his SNF via EMS as a Code Stroke after sudden onset of confusion, garbled speech and what staff at the facility felt was some new right sided weakness and facial droop. LKN was 4 PM. EMS was called and on arrival they noted confusion and incomprehensible speech. Vitals per EMS: 163/88, HR 122, CBG 139.  Per staff at the SNF, he is talkative and coherent at baseline. Dementia is suspected in this patient per chart review, but there is no formal diagnosis listed in Epic. Per SNF personnel, the patient was there due to "family not being able to care for him". He is not on a blood thinner. On arrival he smelled strongly of urine, the odor being suspicious for UTI.   Past Medical History:  Diagnosis Date   Allergy    Fall 09/21/2018   History of transient ischemic attack (TIA) 11/20/2017   History of transient ischemic attack (TIA) 11/20/2017   Hypertension    Old Cerebral infarction (HCC) 09/21/2018   Remote L. thalamic and R. external capsul & corona radiata infarcts   Stroke [facility])     Past Surgical History:  Procedure Laterality Date   laporotomy      Family History  Problem Relation Age of Onset   Hypertension Mother    Diabetes Mother    Stroke Mother    Stroke Father    Stroke Maternal Grandmother    Stroke Maternal Grandfather    Cancer Maternal Grandfather        unknown type   Stroke Paternal Grandmother    Stroke Paternal Grandfather    Kidney failure Son              Social History:  reports that he  has been smoking cigarettes. He has a 25 pack-year smoking history. He has never used smokeless tobacco. He reports that he does not drink alcohol and does not use drugs.  No Known Allergies  SNF MEDICATIONS:  No current facility-administered medications on file prior to encounter.   Current Outpatient Medications on File Prior to Encounter  Medication Sig Dispense Refill   amLODipine (NORVASC) 10 MG tablet Take 1 tablet (10 mg total) by mouth daily. 90 tablet 3   atorvastatin (LIPITOR) 40 MG tablet Take 1 tablet (40 mg total) by mouth daily. 90 tablet 3   losartan (COZAAR) 25 MG tablet Take 1 tablet (25 mg total) by mouth daily. 90 tablet 3     ROS:                                                                                                                                       Unable to obtain due to AMS.    Blood pressure (!) 154/101, pulse (!) 115, temperature 100 F (37.8 C), temperature source Oral, resp. rate 20, weight 72.8 kg, SpO2 99%.   General Examination:                                                                                                       Physical Exam  HEENT-  [place]/AT    Lungs- Respirations unlabored Extremities- Prominent onychomycosis and tinea pedis bilaterally with flaking skin  Neurological Examination Mental Status: *** Alert, oriented x 5, thought content appropriate.  Speech fluent without evidence of aphasia.  Able to follow all commands without difficulty. Cranial Nerves: II: Temporal visual fields intact with no extinction to DSS. PERRL  III,IV, VI: No ptosis. EOMI.  V: Temp sensation equal bilaterally  VII: Smile symmetric VIII: Hearing intact to voice IX,X: No hoarseness XI: Symmetric shoulder shrug XII: Midline tongue extension Motor: *** No pronator drift.  Pill-rolling tremor intermittently to RUE. Cogwheel  rigidity of RUE also noted. LUE tone is normal.  Sensory: Temp and light touch intact throughout, bilaterally. No extinction to DSS.  Deep Tendon Reflexes: 2+ and symmetric throughout Cerebellar: No ataxia with FNF bilaterally  Gait: Deferred    Lab Results: Basic Metabolic Panel: Recent Labs  Lab 05/17/23 1912  NA 136  K 3.5  CL 101  GLUCOSE 119*  BUN 15  CREATININE 0.90    CBC: Recent Labs  Lab 05/17/23 1908 05/17/23 1912  WBC 10.9*  --   NEUTROABS 9.1*  --   HGB 15.9 17.0  HCT 47.9 50.0  MCV 88.1  --   PLT 191  --     Cardiac Enzymes: No results for input(s): "CKTOTAL", "CKMB", "CKMBINDEX", "TROPONINI" in the last 168 hours.  Lipid Panel: No results for input(s): "CHOL", "TRIG", "HDL", "CHOLHDL", "VLDL", "LDLCALC" in the last 168 hours.  Imaging: CT HEAD CODE STROKE WO CONTRAST  Result Date: 05/17/2023 CLINICAL DATA:  Code stroke.  Weakness EXAM: CT HEAD WITHOUT CONTRAST TECHNIQUE: Contiguous axial images were obtained from the base of the skull through the vertex without intravenous contrast. RADIATION DOSE REDUCTION: This exam was performed according to the departmental dose-optimization program which includes automated exposure control, adjustment of the mA and/or kV according to patient size and/or use of iterative reconstruction technique. COMPARISON:  10/05/2021 FINDINGS: Brain: There is no mass, hemorrhage or extra-axial collection. There is generalized atrophy without lobar predilection. There is hypoattenuation of the periventricular white matter, most commonly indicating chronic ischemic microangiopathy. Old right basal ganglia small vessel infarct. Vascular: No abnormal hyperdensity of the major intracranial arteries or dural venous sinuses. No intracranial atherosclerosis. Skull: The visualized skull base, calvarium and extracranial soft tissues are normal. Sinuses/Orbits: No fluid levels or advanced mucosal thickening of the visualized paranasal sinuses. No  mastoid or middle ear effusion. The orbits are normal. ASPECTS [facility] Stroke Program Early CT Score) - Ganglionic level infarction (caudate, lentiform nuclei, internal capsule, insula, M1-M3 cortex): 7 - Supraganglionic infarction (M4-M6 cortex): 3 Total score (0-10 with 10 being normal): 10 IMPRESSION: 1. No acute intracranial abnormality. 2. Atrophy and advanced chronic ischemic microangiopathy. 3. ASPECTS is 10. These results were communicated to Dr. Caryl Pina at 7:20 pm on 05/17/2023 by text page via the [facility] messaging system. Electronically Signed   By: Deatra Robinson M.D.   On: 05/17/2023 19:20     Assessment: - Exam reveals *** - CT head: No hemorrhage or acute infarction is seen. There is generalized atrophy without lobar predilection. There is hypoattenuation of the periventricular white matter, most commonly indicating chronic ischemic microangiopathy. Old right basal ganglia small vessel infarct.  - Presentation is felt most likely to be due to acute delirium in the setting of probable UTI.    Recommendations: -   Electronically signed: Dr. Caryl Pina 05/17/2023, 7:42 PM

## 2023-05-17 NOTE — Progress Notes (Signed)
Pharmacy Antibiotic Note  Austin Weaver is a 77 y.o. male admitted on 05/17/2023 with possible urosepsis.  Pharmacy has been consulted for Vancomycin and Cefepime  dosing.  Plan: Vancomycin 1500 mg IV now, then 1250 mg IV q24h Cefepime 2 g IV q8h   Weight: 72.8 kg (160 lb 7.9 oz)  Temp (24hrs), Avg:100 F (37.8 C), Min:100 F (37.8 C), Max:100 F (37.8 C)  Recent Labs  Lab 05/17/23 1908 05/17/23 1912 05/17/23 2101  WBC 10.9*  --   --   CREATININE 1.05 0.90  --   LATICACIDVEN  --   --  1.9    Estimated Creatinine Clearance: 65.6 mL/min (by C-G formula based on SCr of 0.9 mg/dL).    No Known Allergies   Austin Weaver 05/17/2023 11:12 PM

## 2023-05-17 NOTE — ED Triage Notes (Signed)
Pt comes via [facility] from [facility] at 4pm, pt has generalized weakness worse on the R side with garbled speech.

## 2023-05-17 NOTE — Sepsis Progress Note (Signed)
Elink following code sepsis °

## 2023-05-17 NOTE — ED Provider Notes (Signed)
[place] EMERGENCY DEPARTMENT AT [facility] Provider Note   CSN: [id number] Arrival date & time: 05/17/23  1859  An emergency department physician performed an initial assessment on this suspected stroke patient at 1900.  History Chief Complaint  Patient presents with   Code Stroke    HPI Austin Weaver is a 77 y.o. male presenting for aphasia and right-sided weakness..   Patient's recorded medical, surgical, social, medication list and allergies were reviewed in the Snapshot window as part of the initial history.   Review of Systems   Review of Systems  Constitutional:  Positive for fever. Negative for chills.  HENT:  Negative for ear pain and sore throat.   Eyes:  Negative for pain and visual disturbance.  Respiratory:  Negative for cough and shortness of breath.   Cardiovascular:  Negative for chest pain and palpitations.  Gastrointestinal:  Negative for abdominal pain and vomiting.  Genitourinary:  Positive for frequency. Negative for dysuria and hematuria.  Musculoskeletal:  Negative for arthralgias and back pain.  Skin:  Negative for color change and rash.  Neurological:  Positive for weakness. Negative for seizures and syncope.  Psychiatric/Behavioral:  Positive for confusion.   All other systems reviewed and are negative.   Physical Exam Updated Vital Signs BP (!) 164/96   Pulse (!) 117   Temp 100 F (37.8 C) (Oral)   Resp 19   Wt 72.8 kg   SpO2 99%   BMI 27.12 kg/m  Physical Exam Vitals and nursing note reviewed.  Constitutional:      General: He is not in acute distress.    Appearance: He is well-developed.  HENT:     Head: Normocephalic and atraumatic.  Eyes:     Conjunctiva/sclera: Conjunctivae normal.  Cardiovascular:     Rate and Rhythm: Normal rate and regular rhythm.     Heart sounds: No murmur heard. Pulmonary:     Effort: Pulmonary effort is normal. No respiratory distress.     Breath sounds: Normal breath sounds.   Abdominal:     Palpations: Abdomen is soft.     Tenderness: There is no abdominal tenderness.  Musculoskeletal:        General: No swelling.     Cervical back: Neck supple.  Skin:    General: Skin is warm and dry.     Capillary Refill: Capillary refill takes less than 2 seconds.  Neurological:     Mental Status: He is alert. He is disoriented.  Psychiatric:        Mood and Affect: Mood normal.      ED Course/ Medical Decision Making/ A&P    Procedures Procedures   Medications Ordered in ED Medications  lactated ringers infusion ( Intravenous New Bag/Given 05/17/23 2127)  cefTRIAXone (ROCEPHIN) 2 g in sodium chloride 0.9 % 100 mL IVPB (0 g Intravenous Stopped 05/17/23 2302)  lactated ringers bolus 1,000 mL (0 mLs Intravenous Stopped 05/17/23 2302)    And  lactated ringers bolus 1,000 mL (0 mLs Intravenous Stopped 05/17/23 2302)    And  lactated ringers bolus 250 mL (0 mLs Intravenous Stopped 05/17/23 2251)  acetaminophen (TYLENOL) tablet 1,000 mg (1,000 mg Oral Given 05/17/23 2134)   Medical Decision Making:   Austin Weaver is a 77 y.o. male who presented to the ED today with altered mental status detailed above.    Handoff received from EMS.  Patient placed on continuous vitals and telemetry monitoring while in ED which was reviewed periodically.  Complete initial physical  exam performed, notably the patient  was hemodynamically stable in no acute distress.    Reviewed and confirmed nursing documentation for past medical history, family history, social history.    Initial Assessment:   With the patient's presentation of altered mental status, most likely diagnosis is delerium 2/2 infectious etiology (UTI/CAP/URI) vs metabolic abnormality (Na/K/Mg/Ca) vs nonspecific etiology. Other diagnoses were considered including (but not limited to) CVA, ICH, intracranial mass, critical dehydration, heptatic dysfunction, uremia, hypercarbia, intoxication, endrocrine abnormality,  toxidrome. These are considered less likely due to history of present illness and physical exam findings.   This is most consistent with an acute life/limb threatening illness complicated by underlying chronic conditions.  Initial Plan:  Initially activated as a code stroke due to aphasia but his last known well was 4 hours prior to arrival.  He did not have focal weakness on neurology's exam as a code stroke was canceled. CTH to evaluate for intracranial etiology of patient's symptoms  Screening labs including CBC and Metabolic panel to evaluate for infectious or metabolic etiology of disease.  Urinalysis with reflex culture ordered to evaluate for UTI or relevant urologic/nephrologic pathology.  CXR to evaluate for structural/infectious intrathoracic pathology.  EKG to evaluate for cardiac pathology Objective evaluation as below reviewed   Initial Study Results:   Laboratory  All laboratory results reviewed without evidence of clinically relevant pathology.    EKG EKG was reviewed independently. Rate, rhythm, axis, intervals all examined and without medically relevant abnormality. ST segments without concerns for elevations.    Radiology:  All images reviewed independently. Agree with radiology report at this time.   CT HEAD CODE STROKE WO CONTRAST  Result Date: 05/17/2023 CLINICAL DATA:  Code stroke.  Weakness EXAM: CT HEAD WITHOUT CONTRAST TECHNIQUE: Contiguous axial images were obtained from the base of the skull through the vertex without intravenous contrast. RADIATION DOSE REDUCTION: This exam was performed according to the departmental dose-optimization program which includes automated exposure control, adjustment of the mA and/or kV according to patient size and/or use of iterative reconstruction technique. COMPARISON:  10/05/2021 FINDINGS: Brain: There is no mass, hemorrhage or extra-axial collection. There is generalized atrophy without lobar predilection. There is hypoattenuation  of the periventricular white matter, most commonly indicating chronic ischemic microangiopathy. Old right basal ganglia small vessel infarct. Vascular: No abnormal hyperdensity of the major intracranial arteries or dural venous sinuses. No intracranial atherosclerosis. Skull: The visualized skull base, calvarium and extracranial soft tissues are normal. Sinuses/Orbits: No fluid levels or advanced mucosal thickening of the visualized paranasal sinuses. No mastoid or middle ear effusion. The orbits are normal. ASPECTS [facility] Stroke Program Early CT Score) - Ganglionic level infarction (caudate, lentiform nuclei, internal capsule, insula, M1-M3 cortex): 7 - Supraganglionic infarction (M4-M6 cortex): 3 Total score (0-10 with 10 being normal): 10 IMPRESSION: 1. No acute intracranial abnormality. 2. Atrophy and advanced chronic ischemic microangiopathy. 3. ASPECTS is 10. These results were communicated to Dr. Caryl Pina at 7:20 pm on 05/17/2023 by text page via the [facility] messaging system. Electronically Signed   By: Deatra Robinson M.D.   On: 05/17/2023 19:20      Final Assessment and Plan:   Concern for developing sepsis likely urologic etiology.  Unfortunately it took well over 4 hours to collect a urine sample and patient had already received antibiotics so sterilization of urologic source is possible.  However blood cultures are pending as well as urologic cultures.  Regardless, patient remains tachycardic and altered with fever he is going to require  admission for sepsis of unknown origin.  No abdominal pain on my exam.    Clinical Impression:  1. Left-sided weakness      Admit   Final Clinical Impression(s) / ED Diagnoses Final diagnoses:  Left-sided weakness    Rx / DC Orders ED Discharge Orders     None         Glyn Ade, MD 05/17/23 2310

## 2023-05-17 NOTE — Code Documentation (Signed)
Responded to Code Stroke called [phone] for R sided weakness and aphasian, LSN-1600. Pt arrived at 1859, CBG-105, NIH-8, CT head negative for acute changes. TNK not given-not a stroke. Plan metabolic w/o.

## 2023-05-17 NOTE — H&P (Signed)
History and Physical    Patient: Austin Weaver DOB: Aug 28, 1946 DOA: 05/17/2023 DOS: the patient was seen and examined on 05/17/2023 PCP: Mort Sawyers, FNP  Patient coming from: SNF  Chief Complaint:  Chief Complaint  Patient presents with   Code Stroke   HPI: Austin Weaver is a 77 y.o. male with medical history significant of previous CVA, hypertension, previous history of fall, history of TIAs mainly and allergic rhinitis who was brought in from skilled facility with code stroke.  Patient apparently had sudden onset of confusion with garbled speech.  He was also suspected to have had a new right-sided weakness and facial droop.  His last well-known time was 4 PM and arrived within timeframe for possible tPA.  He had incomprehensible speech when he arrived with dysarthria.  Vitals appear to be largely stable.  Normally patient is talkative.  He had history of possible dementia.  On arrival a code stroke was called for.  Workup so Done Include drug screen that was negative.  Head CT without contrast also negative mainly atrophy.  Urinalysis essentially negative.  Patient is improving.  Speech is improving.  Patient however appears to meet sepsis criteria with temperature 100.5, white count 10.9 and heart rate of 120.  He does have very poor and bilateral feet cellulitis and possible wound.  This may be the source of his sepsis.  With past history of CVA and TIAs also stroke is not completely ruled out.  Patient being admitted to the hospital for 4 treatment of sepsis possibly due to cellulitis and possible CVA.  Review of Systems: As mentioned in the history of present illness. All other systems reviewed and are negative. Past Medical History:  Diagnosis Date   Allergy    Fall 09/21/2018   History of transient ischemic attack (TIA) 11/20/2017   History of transient ischemic attack (TIA) 11/20/2017   Hypertension    Old Cerebral infarction (HCC) 09/21/2018   Remote L.  thalamic and R. external capsul & corona radiata infarcts   Stroke [facility])    Past Surgical History:  Procedure Laterality Date   laporotomy     Social History:  reports that he has been smoking cigarettes. He has a 25 pack-year smoking history. He has never used smokeless tobacco. He reports that he does not drink alcohol and does not use drugs.  No Known Allergies  Family History  Problem Relation Age of Onset   Hypertension Mother    Diabetes Mother    Stroke Mother    Stroke Father    Stroke Maternal Grandmother    Stroke Maternal Grandfather    Cancer Maternal Grandfather        unknown type   Stroke Paternal Grandmother    Stroke Paternal Grandfather    Kidney failure Son     Prior to Admission medications   Medication Sig Start Date End Date Taking? Authorizing Provider  amLODipine (NORVASC) 10 MG tablet Take 1 tablet (10 mg total) by mouth daily. 02/20/23   Mort Sawyers, FNP  atorvastatin (LIPITOR) 40 MG tablet Take 1 tablet (40 mg total) by mouth daily. 02/20/23 02/21/24  Mort Sawyers, FNP  losartan (COZAAR) 25 MG tablet Take 1 tablet (25 mg total) by mouth daily. 02/20/23   Mort Sawyers, FNP    Physical Exam: Vitals:   05/17/23 1945 05/17/23 2015 05/17/23 2100 05/17/23 2130  BP: (!) 160/94 (!) 158/95 (!) 148/99 (!) 164/96  Pulse: (!) 116 (!) 120 (!) 118 (!) 117  Resp: Marland Kitchen)  21 20 (!) 22 19  Temp:      TempSrc:      SpO2: 98% 100% 100% 99%  Weight:       Constitutional: Chronically ill looking, mildly dysarthric, NAD, calm, comfortable Eyes: PERRL, lids and conjunctivae normal ENMT: Mucous membranes are moist. Posterior pharynx clear of any exudate or lesions.Normal dentition.  Neck: normal, supple, no masses, no thyromegaly Respiratory: clear to auscultation bilaterally, no wheezing, no crackles. Normal respiratory effort. No accessory muscle use.  Cardiovascular: Sinus tachycardia, no murmurs / rubs / gallops. No extremity edema. 2+ pedal pulses. No carotid  bruits.  Abdomen: no tenderness, no masses palpated. No hepatosplenomegaly. Bowel sounds positive.  Musculoskeletal: Good range of motion, no joint swelling or tenderness, Skin: no rashes, lesions, ulcers. No induration Neurologic: CN 2-12 grossly intact. Sensation intact, DTR normal. Strength 5/5 in all 4.  Psychiatric: Mildly confused. Alert and oriented x 3. Normal mood  Data Reviewed:  Temperature 100 blood pressure 164/96, pulse 120 respiratory 22 oxygen sat 98% room air.  White count 10.9, CO2 21, creatinine 0.90 glucose 119.Head CT without contrast shows no acute findings.  Urine cultures and blood cultures obtained.  Assessment and Plan:  #1 acute metabolic encephalopathy: Suspected sepsis versus CVA.  Patient will be admitted.  Will be treated for both sepsis probably as a result of cellulitis versus UTI.  Also get MRI of the brain for CVA workup.  Neurology already consulted.  Following their recommendation.  #2 sepsis: Continue IV antibiotics.  Obtain urine and blood cultures.  #3 history of CVA: Continue with MRI of the brain.  If CVA confirmed patient will get full workup.  Meanwhile continue with current home regimen.  #4 essential hypertension: Patient on amlodipine and losartan.  Will continue  #5 hyperlipidemia: Continue Lipitor 40 mg daily.  #6 Alzheimer's dementia: Reported in patient's chart but no obvious treatment.  Continue treatment  #7 onychomycosis: Most likely secondary cellulitis.  Continue monitoring.    Advance Care Planning:   Code Status: Full Code   Consults: Neurologist Dr. Otelia Limes  Family Communication: No family at bedside  Severity of Illness: The appropriate patient status for this patient is INPATIENT. Inpatient status is judged to be reasonable and necessary in order to provide the required intensity of service to ensure the patient's safety. The patient's presenting symptoms, physical exam findings, and initial radiographic and laboratory  data in the context of their chronic comorbidities is felt to place them at high risk for further clinical deterioration. Furthermore, it is not anticipated that the patient will be medically stable for discharge from the hospital within 2 midnights of admission.   * I certify that at the point of admission it is my clinical judgment that the patient will require inpatient hospital care spanning beyond 2 midnights from the point of admission due to high intensity of service, high risk for further deterioration and high frequency of surveillance required.*  AuthorLonia Blood, MD 05/17/2023 11:10 PM  For on call review [email or web address].

## 2023-05-18 ENCOUNTER — Inpatient Hospital Stay (HOSPITAL_COMMUNITY): Payer: Medicare HMO

## 2023-05-18 ENCOUNTER — Ambulatory Visit: Payer: Medicare HMO | Admitting: Family

## 2023-05-18 DIAGNOSIS — A419 Sepsis, unspecified organism: Secondary | ICD-10-CM | POA: Diagnosis not present

## 2023-05-18 DIAGNOSIS — R410 Disorientation, unspecified: Secondary | ICD-10-CM | POA: Diagnosis not present

## 2023-05-18 DIAGNOSIS — R531 Weakness: Secondary | ICD-10-CM | POA: Diagnosis not present

## 2023-05-18 DIAGNOSIS — I10 Essential (primary) hypertension: Secondary | ICD-10-CM

## 2023-05-18 DIAGNOSIS — Z8673 Personal history of transient ischemic attack (TIA), and cerebral infarction without residual deficits: Secondary | ICD-10-CM

## 2023-05-18 DIAGNOSIS — R6520 Severe sepsis without septic shock: Secondary | ICD-10-CM

## 2023-05-18 DIAGNOSIS — J019 Acute sinusitis, unspecified: Secondary | ICD-10-CM | POA: Diagnosis not present

## 2023-05-18 LAB — PROTEIN AND GLUCOSE, CSF
Glucose, CSF: 50 mg/dL (ref 40–70)
Total  Protein, CSF: 75 mg/dL — ABNORMAL HIGH (ref 15–45)

## 2023-05-18 LAB — MENINGITIS/ENCEPHALITIS PANEL (CSF)

## 2023-05-18 LAB — CBC
HCT: 45.8 % (ref 39.0–52.0)
Hemoglobin: 15.1 g/dL (ref 13.0–17.0)
MCH: 29.3 pg (ref 26.0–34.0)
MCHC: 33 g/dL (ref 30.0–36.0)
MCV: 88.9 fL (ref 80.0–100.0)
Platelets: 160 K/uL (ref 150–400)
RBC: 5.15 MIL/uL (ref 4.22–5.81)
RDW: 14.6 % (ref 11.5–15.5)
WBC: 8.3 K/uL (ref 4.0–10.5)
nRBC: 0 % (ref 0.0–0.2)

## 2023-05-18 LAB — CSF CULTURE W GRAM STAIN

## 2023-05-18 LAB — COMPREHENSIVE METABOLIC PANEL
ALT: 12 U/L (ref 0–44)
AST: 16 U/L (ref 15–41)
Albumin: 3 g/dL — ABNORMAL LOW (ref 3.5–5.0)
Alkaline Phosphatase: 68 U/L (ref 38–126)
Anion gap: 13 (ref 5–15)
BUN: 10 mg/dL (ref 8–23)
CO2: 25 mmol/L (ref 22–32)
Calcium: 8.5 mg/dL — ABNORMAL LOW (ref 8.9–10.3)
Chloride: 98 mmol/L (ref 98–111)
Creatinine, Ser: 0.93 mg/dL (ref 0.61–1.24)
GFR, Estimated: >60 mL/min (ref >60)
Glucose, Bld: 98 mg/dL (ref 70–99)
Potassium: 3.5 mmol/L (ref 3.5–5.1)
Sodium: 136 mmol/L (ref 135–145)
Total Bilirubin: 0.9 mg/dL (ref 0.3–1.2)
Total Protein: 6.8 g/dL (ref 6.5–8.1)

## 2023-05-18 LAB — GLUCOSE, CAPILLARY: Glucose-Capillary: 105 mg/dL — ABNORMAL HIGH (ref 70–99)

## 2023-05-18 LAB — PROCALCITONIN: Procalcitonin: <0.1 ng/mL

## 2023-05-18 LAB — CSF CELL COUNT WITH DIFFERENTIAL
RBC Count, CSF: 1820 /mm3 — ABNORMAL HIGH
Tube #: 1
WBC, CSF: 3 /mm3 (ref 0–5)

## 2023-05-18 LAB — PROTIME-INR
INR: 1.3 — ABNORMAL HIGH (ref 0.8–1.2)
Prothrombin Time: 16.4 s — ABNORMAL HIGH (ref 11.4–15.2)

## 2023-05-18 LAB — CORTISOL-AM, BLOOD: Cortisol - AM: 12.7 ug/dL (ref 6.7–22.6)

## 2023-05-18 MED ORDER — LOSARTAN POTASSIUM 50 MG PO TABS
25.0000 mg | ORAL_TABLET | Freq: Every day | ORAL | Status: DC
  Administered 2023-05-18: 25 mg via ORAL
  Filled 2023-05-18: qty 1

## 2023-05-18 MED ORDER — ONDANSETRON HCL 4 MG/2ML IJ SOLN: 4.0000 mg | Freq: Four times a day (QID) | INTRAMUSCULAR | Status: DC | PRN

## 2023-05-18 MED ORDER — LIDOCAINE HCL (PF) 1 % IJ SOLN
5.0000 mL | Freq: Once | INTRAMUSCULAR | Status: AC
  Administered 2023-05-18: 5 mL

## 2023-05-18 MED ORDER — ACETAMINOPHEN 650 MG RE SUPP: 650.0000 mg | Freq: Four times a day (QID) | RECTAL | Status: DC | PRN

## 2023-05-18 MED ORDER — METRONIDAZOLE 500 MG/100ML IV SOLN
500.0000 mg | Freq: Two times a day (BID) | INTRAVENOUS | Status: DC
  Administered 2023-05-18 – 2023-05-19 (×5): 500 mg via INTRAVENOUS
  Filled 2023-05-18 (×5): qty 100

## 2023-05-18 MED ORDER — ATORVASTATIN CALCIUM 40 MG PO TABS
40.0000 mg | ORAL_TABLET | Freq: Every day | ORAL | Status: DC
  Administered 2023-05-18 – 2023-05-21 (×4): 40 mg via ORAL
  Filled 2023-05-18 (×4): qty 1

## 2023-05-18 MED ORDER — VANCOMYCIN HCL IN DEXTROSE 1-5 GM/200ML-% IV SOLN: 1000.0000 mg | Freq: Once | INTRAVENOUS | Status: DC

## 2023-05-18 MED ORDER — SODIUM CHLORIDE 0.9 % IV SOLN: 2.0000 g | Freq: Once | INTRAVENOUS | Status: DC

## 2023-05-18 MED ORDER — AMLODIPINE BESYLATE 10 MG PO TABS
10.0000 mg | ORAL_TABLET | Freq: Every day | ORAL | Status: DC
  Administered 2023-05-18 – 2023-05-21 (×4): 10 mg via ORAL
  Filled 2023-05-18: qty 2
  Filled 2023-05-18 (×3): qty 1

## 2023-05-18 MED ORDER — ONDANSETRON HCL 4 MG PO TABS: 4.0000 mg | ORAL_TABLET | Freq: Four times a day (QID) | ORAL | Status: DC | PRN

## 2023-05-18 MED ORDER — LACTATED RINGERS IV SOLN: 150.0000 mL/h | INTRAVENOUS | Status: DC

## 2023-05-18 MED ORDER — ACETAMINOPHEN 325 MG PO TABS
650.0000 mg | ORAL_TABLET | Freq: Four times a day (QID) | ORAL | Status: DC | PRN
  Administered 2023-05-18: 650 mg via ORAL
  Filled 2023-05-18: qty 2

## 2023-05-18 NOTE — ED Notes (Signed)
ED TO INPATIENT HANDOFF REPORT  ED Nurse Name and Phone #:   S Name/Age/Gender Austin Weaver 77 y.o. male Room/Bed: 039C/039C  Code Status   Code Status: Full Code  Home/SNF/Other Home Patient oriented to: self, place, time, and situation Is this baseline? No   Triage Complete: Triage complete  Chief Complaint Sepsis [facility]) [A41.9]  Triage Note Pt comes via [facility] at 4pm, pt has generalized weakness worse on the R side with garbled speech.     Allergies No Known Allergies  Level of Care/Admitting Diagnosis ED Disposition     ED Disposition  Admit   Condition  --   Comment  Hospital Area: [facility] [[id number]]  Level of Care: Telemetry Medical [104]  May admit patient to [facility] or [facility] if equivalent level of care is available:: Yes  Covid Evaluation: Asymptomatic - no recent exposure (last 10 days) testing not required  Diagnosis: Sepsis [facility]) [[id number]]  Admitting Physician: Rometta Emery [[id number]]  Attending Physician: Rometta Emery [[id number]]  Certification:: I certify this patient will need inpatient services for at least 2 midnights  Estimated Length of Stay: 4          B Medical/Surgery History Past Medical History:  Diagnosis Date   Allergy    Fall 09/21/2018   History of transient ischemic attack (TIA) 11/20/2017   History of transient ischemic attack (TIA) 11/20/2017   Hypertension    Old Cerebral infarction (HCC) 09/21/2018   Remote L. thalamic and R. external capsul & corona radiata infarcts   Stroke [facility])    Past Surgical History:  Procedure Laterality Date   laporotomy       A IV Location/Drains/Wounds Patient Lines/Drains/Airways Status     Active Line/Drains/Airways     Name Placement date Placement time Site Days   Peripheral IV 05/17/23 20 G Left;Posterior Hand 05/17/23  2338  Hand  1            Intake/Output Last 24 hours  Intake/Output Summary (Last 24 hours) at  05/18/2023 1455 Last data filed at 05/18/2023 0553 Gross per 24 hour  Intake 2350 ml  Output 1200 ml  Net 1150 ml    Labs/Imaging Results for orders placed or performed during the hospital encounter of 05/17/23 (from the past 48 hour(s))  CBG monitoring, ED     Status: Abnormal   Collection Time: 05/17/23  7:02 PM  Result Value Ref Range   Glucose-Capillary 105 (H) 70 - 99 mg/dL    Comment: Glucose reference range applies only to samples taken after fasting for at least 8 hours.  Ethanol     Status: None   Collection Time: 05/17/23  7:08 PM  Result Value Ref Range   Alcohol, Ethyl (B) <10 <10 mg/dL    Comment: (NOTE) Lowest detectable limit for serum alcohol is 10 mg/dL.  For medical purposes only. Performed at [facility], [address], [place], [place] [address]   Protime-INR     Status: Abnormal   Collection Time: 05/17/23  7:08 PM  Result Value Ref Range   Prothrombin Time 15.3 (H) 11.4 - 15.2 seconds   INR 1.2 0.8 - 1.2    Comment: (NOTE) INR goal varies based on device and disease states. Performed at [facility], [address] [place], [place] [address]   APTT     Status: None   Collection Time: 05/17/23  7:08 PM  Result Value Ref  Range   aPTT 35 24 - 36 seconds    Comment: Performed at [facility], [address] [place], [place] [address]  CBC     Status: Abnormal   Collection Time: 05/17/23  7:08 PM  Result Value Ref Range   WBC 10.9 (H) 4.0 - 10.5 K/uL   RBC 5.44 4.22 - 5.81 MIL/uL   Hemoglobin 15.9 13.0 - 17.0 g/dL   HCT 91.4 78.2 - 95.6 %   MCV 88.1 80.0 - 100.0 fL   MCH 29.2 26.0 - 34.0 pg   MCHC 33.2 30.0 - 36.0 g/dL   RDW 21.3 08.6 - 57.8 %   Platelets 191 150 - 400 K/uL   nRBC 0.0 0.0 - 0.2 %    Comment: Performed at [facility], [address] [place], [place] [address]  Differential     Status: Abnormal   Collection Time: 05/17/23  7:08 PM  Result Value Ref Range   Neutrophils Relative % 83 %   Neutro Abs  9.1 (H) 1.7 - 7.7 K/uL   Lymphocytes Relative 7 %   Lymphs Abs 0.8 0.7 - 4.0 K/uL   Monocytes Relative 9 %   Monocytes Absolute 0.9 0.1 - 1.0 K/uL   Eosinophils Relative 0 %   Eosinophils Absolute 0.0 0.0 - 0.5 K/uL   Basophils Relative 0 %   Basophils Absolute 0.0 0.0 - 0.1 K/uL   Immature Granulocytes 1 %   Abs Immature Granulocytes 0.09 (H) 0.00 - 0.07 K/uL    Comment: Performed at [facility], [address], [place], [place] [address]  Comprehensive metabolic panel     Status: Abnormal   Collection Time: 05/17/23  7:08 PM  Result Value Ref Range   Sodium 136 135 - 145 mmol/L   Potassium 3.6 3.5 - 5.1 mmol/L   Chloride 98 98 - 111 mmol/L   CO2 21 (L) 22 - 32 mmol/L   Glucose, Bld 116 (H) 70 - 99 mg/dL    Comment: Glucose reference range applies only to samples taken after fasting for at least 8 hours.   BUN 13 8 - 23 mg/dL   Creatinine, Ser 1.32 0.61 - 1.24 mg/dL   Calcium 9.2 8.9 - 44.0 mg/dL   Total Protein 8.0 6.5 - 8.1 g/dL   Albumin 3.8 3.5 - 5.0 g/dL   AST 16 15 - 41 U/L   ALT 12 0 - 44 U/L   Alkaline Phosphatase 82 38 - 126 U/L   Total Bilirubin 1.2 0.3 - 1.2 mg/dL   GFR, Estimated >10 >27 mL/min    Comment: (NOTE) Calculated using the CKD-EPI Creatinine Equation (2021)    Anion gap 17 (H) 5 - 15    Comment: Performed at [facility], [address] [place], [place] [address]  I-stat chem 8, ED     Status: Abnormal   Collection Time: 05/17/23  7:12 PM  Result Value Ref Range   Sodium 136 135 - 145 mmol/L   Potassium 3.5 3.5 - 5.1 mmol/L   Chloride 101 98 - 111 mmol/L   BUN 15 8 - 23 mg/dL   Creatinine, Ser 4.40 0.61 - 1.24 mg/dL   Glucose, Bld 347 (H) 70 - 99 mg/dL    Comment: Glucose reference range applies only to samples taken after fasting for at least 8 hours.   Calcium, Ion 1.02 (L) 1.15 - 1.40 mmol/L   TCO2 22 22 - 32 mmol/L   Hemoglobin  17.0 13.0 - 17.0 g/dL   HCT 40.9 81.1 - 91.4 %  Blood culture (routine x 2)     Status: None  (Preliminary result)   Collection Time: 05/17/23  8:51 PM   Specimen: BLOOD RIGHT HAND  Result Value Ref Range   Specimen Description BLOOD RIGHT HAND    Special Requests      BOTTLES DRAWN AEROBIC AND ANAEROBIC Blood Culture adequate volume   Culture      NO GROWTH < 12 HOURS Performed at [facility], [address] [place], [place] [address]    Report Status PENDING   I-Stat CG4 Lactic Acid     Status: None   Collection Time: 05/17/23  9:01 PM  Result Value Ref Range   Lactic Acid, Venous 1.9 0.5 - 1.9 mmol/L  Blood culture (routine x 2)     Status: None (Preliminary result)   Collection Time: 05/17/23  9:34 PM   Specimen: BLOOD RIGHT ARM  Result Value Ref Range   Specimen Description BLOOD RIGHT ARM    Special Requests      BOTTLES DRAWN AEROBIC AND ANAEROBIC Blood Culture adequate volume   Culture      NO GROWTH < 12 HOURS Performed at [facility], [address], [place], [place] [address]    Report Status PENDING   Urine rapid drug screen (hosp performed)     Status: None   Collection Time: 05/17/23 10:02 PM  Result Value Ref Range   Opiates NONE DETECTED NONE DETECTED   Cocaine NONE DETECTED NONE DETECTED   Benzodiazepines NONE DETECTED NONE DETECTED   Amphetamines NONE DETECTED NONE DETECTED   Tetrahydrocannabinol NONE DETECTED NONE DETECTED   Barbiturates NONE DETECTED NONE DETECTED    Comment: (NOTE) DRUG SCREEN FOR MEDICAL PURPOSES ONLY.  IF CONFIRMATION IS NEEDED FOR ANY PURPOSE, NOTIFY LAB WITHIN 5 DAYS.  LOWEST DETECTABLE LIMITS FOR URINE DRUG SCREEN Drug Class                     Cutoff (ng/mL) Amphetamine and metabolites    1000 Barbiturate and metabolites    200 Benzodiazepine                 200 Opiates and metabolites        300 Cocaine and metabolites        300 THC                            50 Performed at [facility], [address], [place], [place] [address]   Urinalysis, Routine w reflex microscopic -Urine, Clean  Catch     Status: Abnormal   Collection Time: 05/17/23 10:02 PM  Result Value Ref Range   Color, Urine YELLOW YELLOW   APPearance HAZY (A) CLEAR   Specific Gravity, Urine 1.014 1.005 - 1.030   pH 7.0 5.0 - 8.0   Glucose, UA NEGATIVE NEGATIVE mg/dL   Hgb urine dipstick MODERATE (A) NEGATIVE   Bilirubin Urine NEGATIVE NEGATIVE   Ketones, ur 5 (A) NEGATIVE mg/dL   Protein, ur NEGATIVE NEGATIVE mg/dL   Nitrite NEGATIVE NEGATIVE   Leukocytes,Ua NEGATIVE NEGATIVE   RBC / HPF 0-5 0 - 5 RBC/hpf   WBC, UA 0-5 0 - 5 WBC/hpf   Bacteria, UA NONE SEEN NONE SEEN   Squamous Epithelial / HPF 0-5 0 - 5 /HPF    Comment: Performed at [facility], [address] [place], [place] [address]  I-Stat CG4 Lactic Acid     Status: None   Collection Time: 05/18/23  1:27 AM  Result Value Ref Range   Lactic Acid, Venous 1.0 0.5 - 1.9 mmol/L  Protime-INR     Status: Abnormal   Collection Time: 05/18/23  4:15 AM  Result Value Ref Range   Prothrombin Time 16.4 (H) 11.4 - 15.2 seconds   INR 1.3 (H) 0.8 - 1.2    Comment: (NOTE) INR goal varies based on device and disease states. Performed at [facility], [address] [place], [place] [address]   Cortisol-am, blood     Status: None   Collection Time: 05/18/23  4:15 AM  Result Value Ref Range   Cortisol - AM 12.7 6.7 - 22.6 ug/dL    Comment: Performed at [facility], [address], [place], [place] [address]  Procalcitonin     Status: None   Collection Time: 05/18/23  4:15 AM  Result Value Ref Range   Procalcitonin <0.10 ng/mL    Comment:        Interpretation: PCT (Procalcitonin) <= 0.5 ng/mL: Systemic infection (sepsis) is not likely. Local bacterial infection is possible. (NOTE)       Sepsis PCT Algorithm           Lower Respiratory Tract                                      Infection PCT Algorithm    ----------------------------     ----------------------------         PCT < 0.25 ng/mL                PCT < 0.10 ng/mL           Strongly encourage             Strongly discourage   discontinuation of antibiotics    initiation of antibiotics    ----------------------------     -----------------------------       PCT 0.25 - 0.50 ng/mL            PCT 0.10 - 0.25 ng/mL               OR       >80% decrease in PCT            Discourage initiation of                                            antibiotics      Encourage discontinuation           of antibiotics    ----------------------------     -----------------------------         PCT >= 0.50 ng/mL              PCT 0.26 - 0.50 ng/mL               AND        <80% decrease in PCT             Encourage initiation of  antibiotics       Encourage continuation           of antibiotics    ----------------------------     -----------------------------        PCT >= 0.50 ng/mL                  PCT > 0.50 ng/mL               AND         increase in PCT                  Strongly encourage                                      initiation of antibiotics    Strongly encourage escalation           of antibiotics                                     -----------------------------                                           PCT <= 0.25 ng/mL                                                 OR                                        > 80% decrease in PCT                                      Discontinue / Do not initiate                                             antibiotics  Performed at [facility], [address] [place], [place] [address]   Comprehensive metabolic panel     Status: Abnormal   Collection Time: 05/18/23  4:15 AM  Result Value Ref Range   Sodium 136 135 - 145 mmol/L   Potassium 3.5 3.5 - 5.1 mmol/L   Chloride 98 98 - 111 mmol/L   CO2 25 22 - 32 mmol/L   Glucose, Bld 98 70 - 99 mg/dL    Comment: Glucose reference range applies only to samples taken after fasting for at least 8 hours.   BUN 10 8 - 23 mg/dL    Creatinine, Ser 3.66 0.61 - 1.24 mg/dL   Calcium 8.5 (L) 8.9 - 10.3 mg/dL   Total Protein 6.8 6.5 - 8.1 g/dL   Albumin 3.0 (L) 3.5 - 5.0 g/dL   AST 16 15 - 41 U/L   ALT 12 0 - 44 U/L   Alkaline Phosphatase 68 38 - 126 U/L   Total Bilirubin  0.9 0.3 - 1.2 mg/dL   GFR, Estimated >16 >10 mL/min    Comment: (NOTE) Calculated using the CKD-EPI Creatinine Equation (2021)    Anion gap 13 5 - 15    Comment: Performed at [facility], [address], [place], [place] [address]  CBC     Status: None   Collection Time: 05/18/23  4:15 AM  Result Value Ref Range   WBC 8.3 4.0 - 10.5 K/uL   RBC 5.15 4.22 - 5.81 MIL/uL   Hemoglobin 15.1 13.0 - 17.0 g/dL   HCT 40.9 81.1 - 91.4 %   MCV 88.9 80.0 - 100.0 fL   MCH 29.3 26.0 - 34.0 pg   MCHC 33.0 30.0 - 36.0 g/dL   RDW 78.2 95.6 - 21.3 %   Platelets 160 150 - 400 K/uL   nRBC 0.0 0.0 - 0.2 %    Comment: Performed at [facility], [address], [place], [place] [address]   MR BRAIN WO CONTRAST  Result Date: 05/18/2023 CLINICAL DATA:  77 year old male code stroke presentation yesterday. Weakness. EXAM: MRI HEAD WITHOUT CONTRAST TECHNIQUE: Multiplanar, multiecho pulse sequences of the brain and surrounding structures were obtained without intravenous contrast. COMPARISON:  Head CT yesterday.  Brain MRI 10/06/2021. CTA head and neck 11/21/2017 FINDINGS: Brain: Stable cerebral volume. No restricted diffusion or evidence of acute infarction. No restricted diffusion to suggest acute infarction. No midline shift, mass effect, evidence of mass lesion, ventriculomegaly, extra-axial collection or acute intracranial hemorrhage. Cervicomedullary junction and pituitary are within normal limits. Chronic confluent, asymmetric cerebral white matter T2 and FLAIR hyperintensity is greater in the right hemisphere. Superimposed chronic lacunar infarct of the right external capsule. Chronic thalamic lacunar infarcts are stable. Occasional chronic micro hemorrhages  in the brain (right lentiform, left periatrial white matter. Microhemorrhage in the pons to the right of midline was not apparent last year but might have been obscured on SWI. And surrounding chronic pontine T2/FLAIR hyperintensity is stable. Small chronic lacunar infarct in the left deep cerebellar nucleus with hemosiderin is stable. Vascular: Major intracranial vascular flow voids are stable, chronically absent distal right vertebral artery flow void (series 10, image 5), and that vessel was occluded on the 2019 CTA. Skull and upper cervical spine: Visualized bone marrow signal is within normal limits. Partially visible cervical spine degeneration. Sinuses/Orbits: Underlying hyperplastic paranasal sinuses. Bilateral paranasal sinus fluid levels, significant sphenoid and ethmoid opacification as seen by CT yesterday. But the sinus fluid also is restricted on diffusion suggesting purulence (right frontal sinus series 5, image 73). Still, orbits soft tissues appear to remain normal and no complicating features are identified. Other: Mastoids remain clear. Visible internal auditory structures appear normal. Negative visible scalp and face. IMPRESSION: 1. Evidence of Purulent acute paranasal sinusitis. Stable fluid levels and opacification of congenitally hyperplastic paranasal sinuses since the Head CT yesterday. No complicating features identified on this noncontrast exam. 2. No other acute intracranial abnormality. Advanced chronic small vessel disease not significantly changed from an MRI last year. Chronically occluded right vertebral artery. Electronically Signed   By: Odessa Fleming M.D.   On: 05/18/2023 04:25   CT HEAD CODE STROKE WO CONTRAST  Result Date: 05/17/2023 CLINICAL DATA:  Code stroke.  Weakness EXAM: CT HEAD WITHOUT CONTRAST TECHNIQUE: Contiguous axial images were obtained from the base of the skull through the vertex without intravenous contrast. RADIATION DOSE REDUCTION: This exam was performed  according to the departmental dose-optimization program which includes automated exposure control, adjustment  of the mA and/or kV according to patient size and/or use of iterative reconstruction technique. COMPARISON:  10/05/2021 FINDINGS: Brain: There is no mass, hemorrhage or extra-axial collection. There is generalized atrophy without lobar predilection. There is hypoattenuation of the periventricular white matter, most commonly indicating chronic ischemic microangiopathy. Old right basal ganglia small vessel infarct. Vascular: No abnormal hyperdensity of the major intracranial arteries or dural venous sinuses. No intracranial atherosclerosis. Skull: The visualized skull base, calvarium and extracranial soft tissues are normal. Sinuses/Orbits: No fluid levels or advanced mucosal thickening of the visualized paranasal sinuses. No mastoid or middle ear effusion. The orbits are normal. ASPECTS [facility] Stroke Program Early CT Score) - Ganglionic level infarction (caudate, lentiform nuclei, internal capsule, insula, M1-M3 cortex): 7 - Supraganglionic infarction (M4-M6 cortex): 3 Total score (0-10 with 10 being normal): 10 IMPRESSION: 1. No acute intracranial abnormality. 2. Atrophy and advanced chronic ischemic microangiopathy. 3. ASPECTS is 10. These results were communicated to Dr. Caryl Pina at 7:20 pm on 05/17/2023 by text page via the [facility] messaging system. Electronically Signed   By: Deatra Robinson M.D.   On: 05/17/2023 19:20    Pending Labs Unresulted Labs (From admission, onward)     Start     Ordered   05/19/23 0500  Basic metabolic panel  Tomorrow morning,   R        05/18/23 1429   05/19/23 0500  CBC  Tomorrow morning,   R        05/18/23 1429   05/17/23 2012  Urine Culture  Once,   URGENT       Question:  Indication  Answer:  Dysuria   05/17/23 2011            Vitals/Pain Today's Vitals   05/18/23 1315 05/18/23 1330 05/18/23 1345 05/18/23 1400  BP: 136/79 138/82 (!) 160/89 (!)  141/79  Pulse:    97  Resp: (!) 23 20 (!) 26 20  Temp:      TempSrc:      SpO2:    99%  Weight:      PainSc:        Isolation Precautions No active isolations  Medications Medications  amLODipine (NORVASC) tablet 10 mg (10 mg Oral Given 05/18/23 0926)  atorvastatin (LIPITOR) tablet 40 mg (40 mg Oral Given 05/18/23 0926)  metroNIDAZOLE (FLAGYL) IVPB 500 mg (0 mg Intravenous Stopped 05/18/23 1033)  acetaminophen (TYLENOL) tablet 650 mg (has no administration in time range)    Or  acetaminophen (TYLENOL) suppository 650 mg (has no administration in time range)  ondansetron (ZOFRAN) tablet 4 mg (has no administration in time range)    Or  ondansetron (ZOFRAN) injection 4 mg (has no administration in time range)  vancomycin (VANCOREADY) IVPB 1250 mg/250 mL (has no administration in time range)  ceFEPIme (MAXIPIME) 2 g in sodium chloride 0.9 % 100 mL IVPB (2 g Intravenous New Bag/Given 05/18/23 1453)  lactated ringers bolus 1,000 mL (0 mLs Intravenous Stopped 05/17/23 2302)    And  lactated ringers bolus 1,000 mL (0 mLs Intravenous Stopped 05/17/23 2302)    And  lactated ringers bolus 250 mL (0 mLs Intravenous Stopped 05/17/23 2251)  acetaminophen (TYLENOL) tablet 1,000 mg (1,000 mg Oral Given 05/17/23 2134)  vancomycin (VANCOREADY) IVPB 1500 mg/300 mL (0 mg Intravenous Stopped 05/18/23 0419)    Mobility non-ambulatory     Focused Assessments Neuro Assessment Handoff:  Swallow screen pass? Yes    NIH Stroke Scale  Dizziness Present: No Headache Present: No  Interval: Shift assessment Level of Consciousness (1a.)   : Alert, keenly responsive LOC Questions (1b. )   : Answers both questions correctly LOC Commands (1c. )   : Performs both tasks correctly Best Gaze (2. )  : Normal Visual (3. )  : No visual loss Facial Palsy (4. )    : Normal symmetrical movements Motor Arm, Left (5a. )   : No drift Motor Arm, Right (5b. ) : No drift Motor Leg, Left (6a. )  : No drift Motor Leg,  Right (6b. ) : No drift Limb Ataxia (7. ): Absent Sensory (8. )  : Normal, no sensory loss Best Language (9. )  : Mild-to-moderate aphasia Dysarthria (10. ): Mild-to-moderate dysarthria, patient slurs at least some words and, at worst, can be understood with some difficulty Extinction/Inattention (11.)   : No Abnormality Complete NIHSS TOTAL: 7 Last date known well: 05/17/23 Last time known well: 1600 Neuro Assessment: Exceptions to WDL Neuro Checks:   Initial (05/17/23 1912)  Has TPA been given? No If patient is a Neuro Trauma and patient is going to OR before floor call report to 4N Charge nurse: [phone] or [phone]   R Recommendations: See Admitting Provider Note  Report given to:   Additional Notes:

## 2023-05-18 NOTE — Progress Notes (Addendum)
Neurology Progress Note  Brief HPI: 77 year old patient with history of allergic rhinitis, TIA, lacunar strokes and hypertension presented from his SNF with sudden onset of confusion and garbled speech with possible left-sided weakness and facial droop.  Right-sided weakness not seen on exam today, but patient continues to be oriented x 2 at most with inattention.  MRI brain demonstrated no acute intracranial abnormality and purulent acute paranasal sinusitis.  Urinalysis was negative for UTI as source of his confusion.  Lumbar puncture attempted today to rule out CNS infection.  Subjective: Patient is seen in his room with no family at the bedside.  His urinalysis was negative for UTI.  Lumbar puncture was attempted but was unfortunately unsuccessful, will reattempt under fluoroscopy  Exam: Vitals:   05/18/23 1345 05/18/23 1400  BP: (!) 160/89 (!) 141/79  Pulse:  97  Resp: (!) 26 20  Temp:    SpO2:  99%   Gen: In bed, NAD Resp: non-labored breathing, no acute distress Abd: soft, nt  Neuro: Mental Status: Alert to person and place, disoriented to time and situation with inattention noted Cranial Nerves: Pupils equal round and reactive to light, extraocular movements intact, face symmetrical, facial sensation symmetrical, hearing intact to voice, phonation normal, tongue midline Motor: Able to move all 4 extremities with good antigravity strength Sensory: Intact to light touch throughout Gait: Deferred  Pertinent Labs:    Latest Ref Rng & Units 05/18/2023    4:15 AM 05/17/2023    7:12 PM 05/17/2023    7:08 PM  CBC  WBC 4.0 - 10.5 K/uL 8.3   10.9   Hemoglobin 13.0 - 17.0 g/dL 24.4  01.0  27.2   Hematocrit 39.0 - 52.0 % 45.8  50.0  47.9   Platelets 150 - 400 K/uL 160   191        Latest Ref Rng & Units 05/18/2023    4:15 AM 05/17/2023    7:12 PM 05/17/2023    7:08 PM  BMP  Glucose 70 - 99 mg/dL 98  536  644   BUN 8 - 23 mg/dL 10  15  13    Creatinine 0.61 - 1.24 mg/dL 0.34  7.42   5.95   Sodium 135 - 145 mmol/L 136  136  136   Potassium 3.5 - 5.1 mmol/L 3.5  3.5  3.6   Chloride 98 - 111 mmol/L 98  101  98   CO2 22 - 32 mmol/L 25   21   Calcium 8.9 - 10.3 mg/dL 8.5   9.2     Imaging Reviewed:  CT head: No acute abnormality, atrophy and advanced chronic ischemic microangiopathy  MRI brain: No acute intracranial abnormality, acute purulent paranasal sinusitis  Assessment: 77 year old patient with history of allergic rhinitis, TIA, lacunar strokes and hypertension presented with sudden onset of confusion garbled speech and possible right-sided weakness and facial droop.  He continues to be confused with notable inattention.  No acute intracranial abnormality seen on CT head and MRI brain, but MRI does show acute purulent paranasal sinusitis.  UTI was originally suspected as cause of patient's altered mental status, but urinalysis was negative.  He was also question of cellulitis to feet, however this was not noted on exam.  Given acute sinusitis, will need to rule out CNS infection, so lumbar puncture was attempted at bedside but ultimately unsuccessful.  Will reattempt under fluoroscopy.  Impression: Acutely altered mental status, will need to rule out CNS infection but potentially due to toxic metabolic encephalopathy  Recommendations: 1) lumbar puncture under fluoroscopy, send CSF for cell count tubes 1 and 4, protein and glucose, CSF culture and meningitis/encephalitis panel - NPO after MN for procedure, hold DVT prophylaxis tonight 2) patient is already on appropriate antibiotics, to include cefepime Flagyl and vancomycin 3) routine EEG 4) workup of toxic metabolic encephalopathy per primary team  Cortney E Ernestina Columbia , MSN, AGACNP-BC [facility] See Amion for schedule and pager information 05/18/2023 2:45 PM   Attending Neurohospitalist Addendum Patient seen and examined with APP/Resident. Agree with the history and physical as documented  above. Agree with the plan as documented, which I helped formulate. I have edited the note above to reflect my full findings and recommendations. I have independently reviewed the chart, obtained history, review of systems and examined the patient.I have personally reviewed pertinent head/neck/spine imaging (CT/MRI). Please feel free to call with any questions.  -- Bing Neighbors, MD [facility] [phone]  If 7pm- 7am, please page neurology on call as listed in AMION.

## 2023-05-18 NOTE — Progress Notes (Signed)
Neurology Note - 2 Physician Consent for Procedure  This is a 77 year old man on whom we are consulted who presented with mental status changes and fever, leukocytosis, and tachycardia. Cultures have been drawn and he was started on empiric abx with vancomycin and cefepime.  His examination is confounded by his altered mental status.  There was some question of cellulitis on admission notes however it is not evident on current exam.  We currently do not have a source for his sepsis other than an MRI brain revealing purulent paranasal sinus disease.  Patient requires a lumbar puncture to rule out CNS infection in this context.  Patient is not able to provide his own consent due to altered mental status.  He has a legal guardian who is out of the office and her covering staff member is also unavailable by phone.  Therefore hospitalist Dr. Hazeline Junker and I will be invoking 2 physician consent for the procedure.  Bing Neighbors, MD [facility] [phone]  If 7pm- 7am, please page neurology on call as listed in AMION.

## 2023-05-18 NOTE — Progress Notes (Signed)
[facility] PROGRESS NOTE  Jacee Tullos (DOB: October 08, 1945) ZOX:[id number] PCP: Mort Sawyers, FNP  Brief Narrative: Austin Weaver is a 77 y.o. male with a history of CVA, HTN, falls who presented to the ED from SNF on 05/17/2023 due to abrupt onset of confusion, garbled speech, reported to have had right-sided weakness and facial droop, arrived as code stroke. Head CT showed no acute findings. Symptoms began improving. He was febrile to 100.37F with leukocytosis (WBC 10.9k) and tachycardic (HR 120's). Broad antibiotics and 2.5L IVF given for sepsis of undetermined etiology. Blood cultures were drawn, urinalysis showed no WBC or bacteria. MR brain showed evidence of purulent acute paranasal sinusitis, chronically occluded right vertebral artery, and advanced chronic small vessel disease without acute intracranial abnormality.  Subjective: Pt can state name and that he is in [facility] hospital, no response for date/day or cause for hospitalization, unable to tell me where he lives. He denies pain anywhere. Incontinent of urine.  Objective: BP (!) 144/83   Pulse 98   Temp 98 F (36.7 C)   Resp (!) 22   Wt 72.8 kg   SpO2 98%   BMI 27.12 kg/m   Gen: Chronically ill-appearing male in no acute distress Neck: Does not appear overtly meningitic, but unable to flex neck to put chin to chest.  Pulm: Clear, nonlabored  CV: Regular borderline tachycardia without MRG. No pitting edema GI: Soft, NT, ND, +BS Neuro: Alert and oriented to person and place. Dysarthria noted, unclear baseline, sparse speech. Incompletely cooperative with exam. Ext: Warm, no deformities. Skin: Very dry skin throughout. Feet with severe onychomycosis but without erythema, tenderness, induration, fluctuance. There is a hard skin tag on sole of right foot. No other rashes, lesions or ulcers on visualized skin.  Assessment & Plan: Sepsis: Remains of unclear etiology, possibly ABRS. No pyuria or bacteriuria on UA (would  still expect at least some abnormal finding despite being collected shortly after antibiotics given). No respiratory symptoms or pulmonary exam abnormalities. I see no cellulitis or wounds on feet.  - With concomitant purulent paranasal sinus infection and AMS, CNS infection needs to be ruled out. D/w neurology who will proceed with LP. No anticoagulation on board. No hardware noted on XR a few years ago.  - Check CXR.  - Continue broad antibiotic coverage pending culture data. Will narrow as able.   Altered mental status (acute on chronic):  - High suspicion, but not formally diagnosed with dementia PTA. Unable to reach legal guardian by phone.  - EEG - MR brain as above - LP as above  HLD:  - Continue statin  HTN:  - Continue norvasc. Will hold ARB for now.   History of CVA, chronic right vertebral artery occlusion: - Antiplatelet, statin, etc. per neurology. Holding per LP at this time.   Tyrone Nine, MD [facility] [email or web address] 05/18/2023, 2:00 PM

## 2023-05-18 NOTE — ED Notes (Signed)
Taken for MRI at this time.

## 2023-05-18 NOTE — Procedures (Signed)
Technically successful L5/S1 lumbar puncture. Emergency consent obtained from two physicians: Dr. Selina Cooley and Dr. Jarvis Newcomer.   Please see full dictation under the imaging tab in Epic.   Alwyn Ren, [place] [phone] 05/18/2023, 4:36 PM

## 2023-05-18 NOTE — Progress Notes (Signed)
EEG complete - results pending 

## 2023-05-18 NOTE — ED Notes (Signed)
PT off the floor for LP

## 2023-05-18 NOTE — Progress Notes (Signed)
Discussed case with neurology, Dr. Selina Cooley. Briefly, this gentleman presented with mental status changes and has fever, leukocytosis, tachycardia. We have drawn cultures, started empiric antibiotics. His exam is difficult due to altered mentation, but may have nuchal rigidity. I see no significant cellulitis on exam inclusive of his feet/lower extremities. His MRI revealed purulent paranasal sinus disease. There is no alternative source for his sepsis, so we believe lumbar puncture is in the patient's best interest, ideally performed expeditiously. No anticoagulation has been given. We are unable to reach the patient's legal guardian at this time, and so Dr. Selina Cooley and I will be invoking 2 physician consent for this procedure.   Hazeline Junker, MD 05/18/2023 1:17 PM

## 2023-05-18 NOTE — Procedures (Signed)
Procedure Note: Lumbar puncture  Indications: Obtain CSF for specimen to evaluate for infection  Operator: Cortney de la [place] Corona, NP  Others present: Bing Neighbors, MD  Two-physician consent was used as patient is unable to consent for himself and legal guardian could not be reached. Time-out was performed, with all individuals present agreeing on the procedure to be performed, the site of procedure, and the patient identity.  Patient positioned, prepped and draped in usual sterile fashion. L3-4 space located using bilateral iliac crests as landmarks. 1% Lidocaine without epinephrine was used to anesthetize the area.  Attempt was made to introduce a 20-gauge spinal needle into the subarachnoid space, but was unsuccessful.  Blood loss was minimal. A dry guaze dressing was placed over insertion site. Patient tolerated the procedure well and no complications were observed. Spontaneous movement of bilateral extremities were observed after the procedure.  Cortney E Ernestina Columbia , MSN, AGACNP-BC [facility] See Amion for schedule and pager information 05/18/2023 2:44 PM  Neurology Attending Attestation  I was present and supervising Ms. Ernestina Columbia NP throughout the procedure.  Bing Neighbors, MD [facility] [phone]  If 7pm- 7am, please page neurology on call as listed in AMION.

## 2023-05-18 NOTE — ED Notes (Signed)
Patient resting in bed at this time, regular and unlabored respirations. Remains on the cardiac monitor at this time. Denies pain. Updated on plan of care, pt agreeable. Readjusted in bed, applied non skid socks. No needs verbalized at this time.

## 2023-05-18 NOTE — Progress Notes (Signed)
Patient has recent LP done; needs to lie flat for an hour. Will place EEG later on.

## 2023-05-19 DIAGNOSIS — R569 Unspecified convulsions: Secondary | ICD-10-CM

## 2023-05-19 DIAGNOSIS — A419 Sepsis, unspecified organism: Secondary | ICD-10-CM | POA: Diagnosis not present

## 2023-05-19 DIAGNOSIS — G309 Alzheimer's disease, unspecified: Secondary | ICD-10-CM | POA: Diagnosis not present

## 2023-05-19 DIAGNOSIS — G9341 Metabolic encephalopathy: Secondary | ICD-10-CM | POA: Diagnosis not present

## 2023-05-19 DIAGNOSIS — Z8673 Personal history of transient ischemic attack (TIA), and cerebral infarction without residual deficits: Secondary | ICD-10-CM | POA: Diagnosis not present

## 2023-05-19 LAB — URINE CULTURE

## 2023-05-19 MED ORDER — POTASSIUM CHLORIDE CRYS ER 20 MEQ PO TBCR
20.0000 meq | EXTENDED_RELEASE_TABLET | Freq: Once | ORAL | Status: AC
  Administered 2023-05-19: 20 meq via ORAL
  Filled 2023-05-19: qty 1

## 2023-05-19 MED ORDER — ENOXAPARIN SODIUM 40 MG/0.4ML IJ SOSY
40.0000 mg | PREFILLED_SYRINGE | INTRAMUSCULAR | Status: DC
  Administered 2023-05-19 – 2023-05-20 (×2): 40 mg via SUBCUTANEOUS
  Filled 2023-05-19 (×2): qty 0.4

## 2023-05-19 NOTE — TOC Initial Note (Signed)
Transition of Care [facility]) - Initial/Assessment Note    Patient Details  Name: Austin Weaver MRN: [id number] Date of Birth: [birth year]  [facility] CM/SW Contact:    Baldemar Lenis, LCSW Phone Number: 05/19/2023, 11:20 AM  Clinical Narrative:     Patient is a long term care resident at [facility], and can return when medically stable. CSW to follow.              Expected Discharge Plan: Skilled Nursing Facility Barriers to Discharge: Continued Medical Work up   Patient Goals and CMS Choice Patient states their goals for this hospitalization and ongoing recovery are:: patient unable to participate in goal setting, not oriented CMS Medicare.gov Compare Post Acute Care list provided to:: Legal Guardian Choice offered to / list presented to : [facility] POA / Guardian [place] ownership interest in [facility].provided to:: [facility] POA / Guardian    Expected Discharge Plan and Services     Post Acute Care Choice: Skilled Nursing Facility Living arrangements for the past 2 months: Skilled Nursing Facility                                      Prior Living Arrangements/Services Living arrangements for the past 2 months: Skilled Nursing Facility Lives with:: Facility Resident Patient language and need for interpreter reviewed:: No Do you feel safe going back to the place where you live?: Yes      Need for Family Participation in Patient Care: Yes (Comment) Care giver support system in place?: Yes (comment)   Criminal Activity/Legal Involvement Pertinent to Current Situation/Hospitalization: No - Comment as needed  Activities of Daily Living      Permission Sought/Granted Permission sought to share information with : Facility Medical sales representative, Guardian Permission granted to share information with : Yes, Verbal Permission Granted  Share Information with NAME: Sallyanne Kuster  Permission granted to share info w AGENCY: [facility]  Permission  granted to share info w Relationship: Guardian     Emotional Assessment   Attitude/Demeanor/Rapport: Unable to Assess Affect (typically observed): Unable to Assess Orientation: : Oriented to Self, Oriented to Place Alcohol / Substance Use: Not Applicable Psych Involvement: No (comment)  Admission diagnosis:  Left-sided weakness [R53.1] Sepsis (HCC) [A41.9] Patient Active Problem List   Diagnosis Date Noted   Sepsis (HCC) 05/17/2023   Unable to care for self 02/20/2023   Vision changes 02/20/2023   Screening for tuberculosis 02/20/2023   Left carotid bruit 02/20/2023   Decreased pulses in feet 02/20/2023   Pedal edema 02/20/2023   Microalbuminuria 02/20/2023   Onychomycosis 05/28/2022   Nicotine dependence, cigarettes, uncomplicated 10/06/2021   Left-sided weakness 05/01/2021   Lack of access to transportation 05/01/2021   Mixed hyperlipidemia 01/11/2020   Allergies 12/01/2018   Essential hypertension 12/01/2018   Food insecurity 12/01/2018   Alzheimer's dementia with behavioral disturbance (HCC)    Acute metabolic encephalopathy    Vitamin B12 deficiency 09/22/2018   History of stroke 09/22/2018   Old Cerebral infarction (HCC) 09/21/2018   PCP:  Mort Sawyers, FNP Pharmacy:   [facility] [place] [place] - [address] [place] [address] Phone: [phone] Fax: [phone]     Social Determinants of Health (SDOH) Social History: SDOH Screenings   Depression (PHQ2-9): Low Risk  (07/11/2022)  Financial Resource Strain: Medium Risk (12/01/2018)  Tobacco Use: High Risk (05/17/2023)   SDOH Interventions:  Readmission Risk Interventions     No data to display

## 2023-05-19 NOTE — Progress Notes (Signed)
Neurology Progress Note  Brief HPI: 77 year old patient with history of allergic rhinitis, TIA, lacunar strokes and hypertension presented from his SNF with sudden onset of confusion and garbled speech with possible left-sided weakness and facial droop.  Right-sided weakness not seen on exam today, but patient continues to be oriented x 2 at most with inattention.  MRI brain demonstrated no acute intracranial abnormality and purulent acute paranasal sinusitis.  Urinalysis was negative for UTI as source of his confusion.  Lumbar puncture performed yesterday demonstrated 3 WBCs and negative meningitis/encephalitis panel  Subjective: Patient is seen in his room with no family at the bedside.  His mental status is improved today with less inattention, and he is freely conversant but still only oriented to self and place.  Exam: Vitals:   05/19/23 0004 05/19/23 0300  BP: 124/77 128/70  Pulse: 71 66  Resp: 18 16  Temp: 98.2 F (36.8 C) 97.8 F (36.6 C)  SpO2: 97% 96%   Gen: In bed, NAD Resp: non-labored breathing, no acute distress Abd: soft, nt  Neuro: Mental Status: Alert to person and place, disoriented to time and situation, conversant with better attention than yesterday Cranial Nerves: Pupils equal round and reactive to light, extraocular movements intact, face symmetrical, facial sensation symmetrical, hearing intact to voice, phonation normal, tongue midline Motor: Able to move all 4 extremities with good antigravity strength Coordination: Finger-to-nose intact bilaterally Sensory: Intact to light touch throughout Gait: Deferred  Pertinent Labs:    Latest Ref Rng & Units 05/19/2023    3:59 AM 05/18/2023    4:15 AM 05/17/2023    7:12 PM  CBC  WBC 4.0 - 10.5 K/uL 7.9  8.3    Hemoglobin 13.0 - 17.0 g/dL 78.4  69.6  29.5   Hematocrit 39.0 - 52.0 % 40.2  45.8  50.0   Platelets 150 - 400 K/uL 179  160         Latest Ref Rng & Units 05/19/2023    3:59 AM 05/18/2023    4:15 AM  05/17/2023    7:12 PM  BMP  Glucose 70 - 99 mg/dL 89  98  284   BUN 8 - 23 mg/dL 13  10  15    Creatinine 0.61 - 1.24 mg/dL 1.32  4.40  1.02   Sodium 135 - 145 mmol/L 134  136  136   Potassium 3.5 - 5.1 mmol/L 3.4  3.5  3.5   Chloride 98 - 111 mmol/L 101  98  101   CO2 22 - 32 mmol/L 20  25    Calcium 8.9 - 10.3 mg/dL 8.1  8.5    CSF RBC: 7253 CSF WBC 3 CSF glucose 50 CSF protein 75 Meningitis/encephalitis panel negative  Imaging Reviewed:  CT head: No acute abnormality, atrophy and advanced chronic ischemic microangiopathy  MRI brain: No acute intracranial abnormality, acute purulent paranasal sinusitis  EEG 8/12: Intermittent slow generalized, suggestive of mild diffuse encephalopathy with no seizures or epileptiform discharges  Assessment: 77 year old patient with history of allergic rhinitis, TIA, lacunar strokes and hypertension presented with sudden onset of confusion garbled speech and possible right-sided weakness and facial droop.  He continues to be confused with notable inattention.  No acute intracranial abnormality seen on CT head and MRI brain, but MRI does show acute purulent paranasal sinusitis.  UTI was originally suspected as cause of patient's altered mental status, but urinalysis was negative.  He was also question of cellulitis to feet, however this was not noted on exam.  Given acute sinusitis, will need to rule out CNS infection.  Lumbar puncture was attempted at bedside and then performed under fluoroscopy.  Meningitis/encephalitis panel was negative and only 3 WBCs were seen with 1820 red cells, so CNS infection is unlikely.  Slight elevation in CSF protein likely 2/2 blood.  EEG was negative for seizure activity.  Suspect that patient's altered mental status is due to toxic metabolic encephalopathy caused by his acute sinusitis.  Impression: Acutely altered mental status, likely due to toxic metabolic encephalopathy in the setting of acute  sinusitis  Recommendations: -Continue appropriate antibiotics for acute sinusitis -Delirium precautions -Neurology will sign off, please contact with questions or concerns  Cortney E Ernestina Columbia , MSN, AGACNP-BC [facility] See Amion for schedule and pager information 05/19/2023 7:39 AM   Attending Neurohospitalist Addendum Patient seen and examined with APP/Resident. Agree with the history and physical as documented above. Agree with the plan as documented, which I helped formulate. I have edited the note above to reflect my full findings and recommendations. I have independently reviewed the chart, obtained history, review of systems and examined the patient.I have personally reviewed pertinent head/neck/spine imaging (CT/MRI). Please feel free to call with any questions.  -- Bing Neighbors, MD [facility] [phone]  If 7pm- 7am, please page neurology on call as listed in AMION.

## 2023-05-19 NOTE — Procedures (Signed)
Patient Name: Austin Weaver  MRN: [id number]  Epilepsy Attending: Charlsie Quest  Referring Physician/Provider: Marjorie Smolder, NP  Date: 05/18/2023 Duration: 23.09 mins  Patient history: 77 year old patient with history of allergic rhinitis, TIA, lacunar strokes and hypertension presented from his SNF with sudden onset of confusion and garbled speech with possible right-sided weakness and facial droop. EEG to evaluate for seizure  Level of alertness: Awake  AEDs during EEG study: None  Technical aspects: This EEG study was done with scalp electrodes positioned according to the 10-20 International system of electrode placement. Electrical activity was reviewed with band pass filter of 1-70Hz , sensitivity of 7 uV/mm, display speed of 8mm/sec with a 60Hz  notched filter applied as appropriate. EEG data were recorded continuously and digitally stored.  Video monitoring was available and reviewed as appropriate.  Description: The posterior dominant rhythm consists of 8Hz  activity of moderate voltage (25-35 uV) seen predominantly in posterior head regions, symmetric and reactive to eye opening and eye closing. EEG showed intermittent generalized 3 to 6 Hz theta-delta slowing. Hyperventilation and photic stimulation were not performed.     ABNORMALITY - Intermittent slow, generalized  IMPRESSION: This study is suggestive of mild diffuse encephalopathy, nonspecific etiology. No seizures or epileptiform discharges were seen throughout the recording.  Austin Weaver

## 2023-05-19 NOTE — Plan of Care (Signed)
  Problem: Fluid Volume: Goal: Hemodynamic stability will improve Outcome: Progressing   Problem: Clinical Measurements: Goal: Diagnostic test results will improve Outcome: Progressing   Problem: Respiratory: Goal: Ability to maintain adequate ventilation will improve Outcome: Progressing   Problem: Clinical Measurements: Goal: Respiratory complications will improve Outcome: Progressing Goal: Cardiovascular complication will be avoided Outcome: Progressing   Problem: Coping: Goal: Level of anxiety will decrease Outcome: Progressing   Problem: Elimination: Goal: Will not experience complications related to urinary retention Outcome: Progressing   Problem: Pain Managment: Goal: General experience of comfort will improve Outcome: Progressing   Problem: Safety: Goal: Ability to remain free from injury will improve Outcome: Progressing   Problem: Clinical Measurements: Goal: Signs and symptoms of infection will decrease Outcome: Not Progressing   Problem: Education: Goal: Knowledge of General Education information will improve Description: Including pain rating scale, medication(s)/side effects and non-pharmacologic comfort measures Outcome: Not Progressing   Problem: Health Behavior/Discharge Planning: Goal: Ability to manage health-related needs will improve Outcome: Not Progressing   Problem: Clinical Measurements: Goal: Ability to maintain clinical measurements within normal limits will improve Outcome: Not Progressing Goal: Will remain free from infection Outcome: Not Progressing   Problem: Activity: Goal: Risk for activity intolerance will decrease Outcome: Not Progressing   Problem: Nutrition: Goal: Adequate nutrition will be maintained Outcome: Not Progressing   Problem: Elimination: Goal: Will not experience complications related to bowel motility Outcome: Not Progressing

## 2023-05-19 NOTE — Progress Notes (Signed)
[facility] PROGRESS NOTE  Nickolas Debski (DOB: 1946/09/24) [id number] PCP: Mort Sawyers, FNP  Brief Narrative: Toa Marcelin is a 77 y.o. male with a history of CVA, HTN, falls who presented to the ED from SNF on 05/17/2023 due to abrupt onset of confusion, garbled speech, reported to have had right-sided weakness and facial droop, arrived as code stroke. Head CT showed no acute findings. Symptoms began improving. He was febrile to 100.76F with leukocytosis (WBC 10.9k) and tachycardic (HR 120's). Broad antibiotics and 2.5L IVF given for sepsis of undetermined etiology. Blood cultures were drawn, urinalysis showed no WBC or bacteria. MR brain showed evidence of purulent acute paranasal sinusitis, chronically occluded right vertebral artery, and advanced chronic small vessel disease without acute intracranial abnormality.  Subjective: Pt more interactive this morning, still with stuttering speech but knows day and month. He did think Felix Pacini was president and wouldn't answer year question except with "19...." He feels well, denies pain in his head, neck, back, chest, abdomen, or feet. Had fever to 101.83F last night.   Objective: BP 127/70 (BP Location: Left Arm)   Pulse 71   Temp 98.6 F (37 C) (Oral)   Resp 18   Ht 5' 4.5" (1.638 m)   Wt 72.8 kg   SpO2 98%   BMI 27.12 kg/m   Gen: Chronically ill-appearing elderly male in no distress Pulm: Clear, nonlabored  CV: RRR, no MRG or edema GI: Soft, NT, ND, +BS  Neuro: Alert and interactive, cognitively slowed and incompletely oriented, more alert than prior exam, can MAE. Ext: Warm, no deformities Skin: Dry skin LE's unchanged, no cellulitis noted.    Tele reviewed: NSR. DC tele to facilitate mobility.  Assessment & Plan: Sepsis: Remains of unclear etiology, possibly ABRS. No pyuria or bacteriuria on UA (would still expect at least some abnormal finding despite being collected shortly after antibiotics given). No  respiratory symptoms or pulmonary exam abnormalities. I see no cellulitis or wounds on feet. CXR clear, urine culture with nonclonal growth.  - Purulent paranasal sinus infection is primary suspected source. LP with red cloudy CSF, protein 75, glucose 50, WBC 3, RBC 1,820. No organisms on gram stain, negative viral panel. Appreciate neurology recommendations.  - Continue broad antibiotic coverage pending culture data. Blood cultures NGTD. Had fever 8/12 though WBC normal. Will narrow as able, possibly to unasyn. Will d/w neurology.   Altered mental status (acute on chronic):  - High suspicion, but not formally diagnosed with dementia PTA. Unable to reach legal guardian by phone. - EEG with diffuse slowing, mild nonspecific encephalopathy.  - MR brain as above  HLD:  - Continue statin  HTN:  - Continue norvasc. Holding ARB for now. Normotensive.  History of CVA, chronic right vertebral artery occlusion: - Antiplatelet, statin, etc. per neurology.  - Start lovenox VTE ppx 24 hours after LP has been performed.   Hypokalemia:  - Supplement  Tyrone Nine, MD [facility] [email or web address] 05/19/2023, 11:38 AM

## 2023-05-19 NOTE — NC FL2 (Signed)
[place] MEDICAID [id number] LEVEL OF CARE FORM     IDENTIFICATION  Patient Name: Gunther Bussie Birthdate: Apr 12, 1946 Sex: male Admission Date (Current Location): 05/17/2023  [facility] and [place] Number:  Producer, television/film/video and Address:  [facility], [address] [place], [place] [address]      Provider Number: [id number]  Attending Physician Name and Address:  Tyrone Nine, MD  Relative Name and Phone Number:       Current Level of Care: Hospital Recommended Level of Care: Skilled Nursing Facility Prior Approval Number:    Date Approved/Denied:   PASRR Number:    Discharge Plan: SNF    Current Diagnoses: Patient Active Problem List   Diagnosis Date Noted   Sepsis (HCC) 05/17/2023   Unable to care for self 02/20/2023   Vision changes 02/20/2023   Screening for tuberculosis 02/20/2023   Left carotid bruit 02/20/2023   Decreased pulses in feet 02/20/2023   Pedal edema 02/20/2023   Microalbuminuria 02/20/2023   Onychomycosis 05/28/2022   Nicotine dependence, cigarettes, uncomplicated 10/06/2021   Left-sided weakness 05/01/2021   Lack of access to transportation 05/01/2021   Mixed hyperlipidemia 01/11/2020   Allergies 12/01/2018   Essential hypertension 12/01/2018   Food insecurity 12/01/2018   Alzheimer's dementia with behavioral disturbance (HCC)    Acute metabolic encephalopathy    Vitamin B12 deficiency 09/22/2018   History of stroke 09/22/2018   Old Cerebral infarction (HCC) 09/21/2018    Orientation RESPIRATION BLADDER Height & Weight     Self, Place  Normal Incontinent Weight: 160 lb 7.9 oz (72.8 kg) Height:  5' 4.5" (163.8 cm)  BEHAVIORAL SYMPTOMS/MOOD NEUROLOGICAL BOWEL NUTRITION STATUS      Incontinent Diet (heart healthy)  AMBULATORY STATUS COMMUNICATION OF NEEDS Skin   Extensive Assist Verbally Normal                       Personal Care Assistance Level of Assistance  Bathing, Feeding, Dressing Bathing  Assistance: Maximum assistance Feeding assistance: Maximum assistance Dressing Assistance: Maximum assistance     Functional Limitations Info  Speech     Speech Info: Impaired (delayed responses)    SPECIAL CARE FACTORS FREQUENCY                       Contractures Contractures Info: Not present    Additional Factors Info  Code Status, Allergies Code Status Info: Full Allergies Info: NKA           Current Medications (05/19/2023):  This is the current hospital active medication list Current Facility-Administered Medications  Medication Dose Route Frequency Provider Last Rate Last Admin   acetaminophen (TYLENOL) tablet 650 mg  650 mg Oral Q6H PRN Rometta Emery, MD   650 mg at 05/18/23 2012   Or   acetaminophen (TYLENOL) suppository 650 mg  650 mg Rectal Q6H PRN Rometta Emery, MD       amLODipine (NORVASC) tablet 10 mg  10 mg Oral Daily Earlie Lou L, MD   10 mg at 05/19/23 8119   atorvastatin (LIPITOR) tablet 40 mg  40 mg Oral Daily Earlie Lou L, MD   40 mg at 05/19/23 0823   ceFEPIme (MAXIPIME) 2 g in sodium chloride 0.9 % 100 mL IVPB  2 g Intravenous Q8H Earlie Lou L, MD 200 mL/hr at 05/19/23 0551 2 g at 05/19/23 0551   metroNIDAZOLE (FLAGYL) IVPB 500 mg  500 mg Intravenous Q12H Garba,  Cheri Rous, MD 100 mL/hr at 05/19/23 0826 500 mg at 05/19/23 0826   ondansetron (ZOFRAN) tablet 4 mg  4 mg Oral Q6H PRN Rometta Emery, MD       Or   ondansetron (ZOFRAN) injection 4 mg  4 mg Intravenous Q6H PRN Rometta Emery, MD       vancomycin (VANCOREADY) IVPB 1250 mg/250 mL  1,250 mg Intravenous Q24H Earlie Lou L, MD 166.7 mL/hr at 05/18/23 1828 1,250 mg at 05/18/23 1828     Discharge Medications: Please see discharge summary for a list of discharge medications.  Relevant Imaging Results:  Relevant Lab Results:   Additional Information SS#: [id number]  Baldemar Lenis, LCSW

## 2023-05-20 DIAGNOSIS — G309 Alzheimer's disease, unspecified: Secondary | ICD-10-CM | POA: Diagnosis not present

## 2023-05-20 DIAGNOSIS — Z8673 Personal history of transient ischemic attack (TIA), and cerebral infarction without residual deficits: Secondary | ICD-10-CM | POA: Diagnosis not present

## 2023-05-20 DIAGNOSIS — G9341 Metabolic encephalopathy: Secondary | ICD-10-CM | POA: Diagnosis not present

## 2023-05-20 DIAGNOSIS — A419 Sepsis, unspecified organism: Secondary | ICD-10-CM | POA: Diagnosis not present

## 2023-05-20 MED ORDER — SODIUM CHLORIDE 0.9 % IV SOLN
3.0000 g | Freq: Four times a day (QID) | INTRAVENOUS | Status: DC
  Administered 2023-05-20 – 2023-05-21 (×4): 3 g via INTRAVENOUS
  Filled 2023-05-20 (×4): qty 8

## 2023-05-20 MED ORDER — ASPIRIN 81 MG PO TBEC
81.0000 mg | DELAYED_RELEASE_TABLET | Freq: Every day | ORAL | Status: DC
  Administered 2023-05-20 – 2023-05-21 (×2): 81 mg via ORAL
  Filled 2023-05-20 (×2): qty 1

## 2023-05-20 MED ORDER — POTASSIUM CHLORIDE CRYS ER 20 MEQ PO TBCR
40.0000 meq | EXTENDED_RELEASE_TABLET | Freq: Once | ORAL | Status: DC
Start: 2023-05-20 — End: 2023-05-20

## 2023-05-20 NOTE — Care Management Important Message (Signed)
Important Message  Patient Details  Name: Austin Weaver MRN: [id number] Date of Birth: October 04, 1946   Medicare Important Message Given:  Yes     Sayda Grable Stefan Church 05/20/2023, 2:50 PM

## 2023-05-20 NOTE — Progress Notes (Signed)
[facility] PROGRESS NOTE  Shinji Meeker (DOB: March 31, 1946) [id number]:[id number] PCP: Mort Sawyers, FNP  Brief Narrative: Austin Weaver is a 77 y.o. male with a history of CVA, HTN, falls who presented to the ED from SNF on 05/17/2023 due to abrupt onset of confusion, garbled speech, reported to have had right-sided weakness and facial droop, arrived as code stroke. Head CT showed no acute findings. Symptoms began improving. He was febrile to 100.84F with leukocytosis (WBC 10.9k) and tachycardic (HR 120's). Broad antibiotics and 2.5L IVF given for sepsis of undetermined etiology. Blood cultures were drawn, urinalysis showed no WBC or bacteria. MR brain showed evidence of purulent acute paranasal sinusitis, chronically occluded right vertebral artery, and advanced chronic small vessel disease without acute intracranial abnormality.  Subjective: No new complaints, he feels he's thinking more clearly, staff agrees.   Objective: BP 136/83   Pulse 75   Temp 98.5 F (36.9 C) (Oral)   Resp 18   Ht 5' 4.5" (1.638 m)   Wt 72.8 kg   SpO2 99%   BMI 27.12 kg/m   Gen: Chronically ill-appearing male in no distress getting bed level hygienic assistance Pulm: Nonlabored   Neuro: More alert, still incompletely oriented. No new focal deficits.  Assessment & Plan: Sepsis: Remains of unclear etiology, possibly ABRS. No pyuria or bacteriuria on UA (would still expect at least some abnormal finding despite being collected shortly after antibiotics given). No respiratory symptoms or pulmonary exam abnormalities. I see no cellulitis or wounds on feet. CXR clear, urine culture with nonclonal growth.  - Purulent paranasal sinus infection is primary suspected source. LP with red cloudy CSF, protein 75, glucose 50, WBC 3, RBC 1,820. No organisms on gram stain, negative viral panel. Appreciate neurology recommendations.  - Convert to unasyn and monitor for fever. Blood cultures NGTD. Had fever 8/12 after  antibiotics started. His exam cognitively is improving, not fully oriented.  Altered mental status (acute on chronic):  - High suspicion, but not formally diagnosed with dementia PTA. Unable to reach legal guardian by phone. - EEG with diffuse slowing, mild nonspecific encephalopathy.  - MR brain as above  HLD:  - Continue statin  HTN:  - Continue norvasc. ARB not on MAR. HCTZ 12.5mg  noted which we'll hold with hypokalemia and hyponatremia.   History of CVA, chronic right vertebral artery occlusion: - Restart aspirin, continue statin  - Continue lovenox VTE ppx 24 hours after LP has been performed.   Hypokalemia:  - Supplemented  Tyrone Nine, MD [facility] [email or web address] 05/20/2023, 2:02 PM

## 2023-05-20 NOTE — Plan of Care (Signed)
  Problem: Fluid Volume: Goal: Hemodynamic stability will improve Outcome: Progressing   Problem: Clinical Measurements: Goal: Diagnostic test results will improve Outcome: Progressing Goal: Signs and symptoms of infection will decrease Outcome: Progressing   Problem: Respiratory: Goal: Ability to maintain adequate ventilation will improve Outcome: Progressing   Problem: Clinical Measurements: Goal: Ability to maintain clinical measurements within normal limits will improve Outcome: Progressing Goal: Will remain free from infection Outcome: Progressing Goal: Diagnostic test results will improve Outcome: Progressing Goal: Respiratory complications will improve Outcome: Progressing Goal: Cardiovascular complication will be avoided Outcome: Progressing   Problem: Coping: Goal: Level of anxiety will decrease Outcome: Progressing   Problem: Elimination: Goal: Will not experience complications related to bowel motility Outcome: Progressing Goal: Will not experience complications related to urinary retention Outcome: Progressing   Problem: Education: Goal: Knowledge of General Education information will improve Description: Including pain rating scale, medication(s)/side effects and non-pharmacologic comfort measures Outcome: Not Progressing   Problem: Health Behavior/Discharge Planning: Goal: Ability to manage health-related needs will improve Outcome: Not Progressing   Problem: Activity: Goal: Risk for activity intolerance will decrease Outcome: Not Progressing   Problem: Nutrition: Goal: Adequate nutrition will be maintained Outcome: Not Progressing

## 2023-05-20 NOTE — Progress Notes (Signed)
Report called to Amy, RN on 2 west.

## 2023-05-20 NOTE — Evaluation (Signed)
Physical Therapy Evaluation  Patient Details Name: Austin Weaver MRN: [id number] DOB: [birth year] Today's Date: 05/20/2023  History of Present Illness  Pt is a 77 y/o male who presents 05/17/2023 from [facility] where he is a long term resident with abrupt onset of confusion, garbled speech, and R sided weakness with facial droop. Admitted for sepsis of unclear etiology. CT and MRI scans negative for acute changes. PMH significant for TIA, lacunar strokes, HTN.  Clinical Impression  Pt admitted with above diagnosis. Pt currently with functional limitations due to the deficits listed below (see PT Problem List). At the time of PT eval pt was able to perform transfers and ambulation with gross min assist and RW for support. Pt reports he typically walks to the bathroom with his RW, however appeared more hesitant to ambulate today. Anticipate pt is near baseline of function. Acutely, pt will benefit from acute skilled PT to increase their independence and safety with mobility to allow discharge.           If plan is discharge home, recommend the following:     Can travel by private vehicle   Yes    Equipment Recommendations None recommended by PT  Recommendations for Other Services       Functional Status Assessment Patient has had a recent decline in their functional status and demonstrates the ability to make significant improvements in function in a reasonable and predictable amount of time.     Precautions / Restrictions Precautions Precautions: Fall Restrictions Weight Bearing Restrictions: No      Mobility  Bed Mobility Overal bed mobility: Needs Assistance Bed Mobility: Supine to Sit     Supine to sit: Min assist     General bed mobility comments: Pt not initiating movement when cued to sit up on the L side of the bed. When plan changed and pt was cued to sit up on the R side of the bed, pt immediately initiated movement towards that side. He utilized the railing well  with LUE and was able to sit up with increased time and min assist.    Transfers Overall transfer level: Needs assistance Equipment used: Rolling walker (2 wheels) Transfers: Sit to/from Stand Sit to Stand: Min assist           General transfer comment: VC's for hand placement on seated surface for safety. Assist to power up to full stand.    Ambulation/Gait Ambulation/Gait assistance: Min assist Gait Distance (Feet): 15 Feet Assistive device: Rolling walker (2 wheels) Gait Pattern/deviations: Shuffle, Decreased stride length, Step-through pattern, Trunk flexed Gait velocity: Decreased Gait velocity interpretation: <1.31 ft/sec, indicative of household ambulator   General Gait Details: Low floor clearance approaching shuffling pattern. Hesitant to ambulate and asking to go back to bed. Pt with 1 LOB but able to recover with min assist.  Stairs            Wheelchair Mobility     Tilt Bed    Modified Rankin (Stroke Patients Only)       Balance Overall balance assessment: Needs assistance Sitting-balance support: Feet supported Sitting balance-Leahy Scale: Fair Sitting balance - Comments: did not challenge sitting balance today   Standing balance support: During functional activity, Single extremity supported Standing balance-Leahy Scale: Fair Standing balance comment: statically with UE supporting standing pericare                             Pertinent Vitals/Pain Pain Assessment Pain Assessment:  No/denies pain    Home Living Family/patient expects to be discharged to:: Skilled nursing facility                   Additional Comments: Lacinda Axon, pt states he has a RW and WC.    Prior Function Prior Level of Function : Needs assist;Patient poor historian/Family not available             Mobility Comments: questionable historian, reports indep transfers to [facility] and able to use RW ADLs Comments: questionable historian, anticipate  staff assists with ADLs     Extremity/Trunk Assessment   Upper Extremity Assessment Upper Extremity Assessment: Generalized weakness    Lower Extremity Assessment Lower Extremity Assessment: Generalized weakness    Cervical / Trunk Assessment Cervical / Trunk Assessment: Normal (Forward head posture with rounded shoulders)  Communication   Communication Communication: No apparent difficulties (Stuttering at times, difficulty finishing a thought but overall no difficulty understanding him.)  Cognition Arousal: Alert Behavior During Therapy: WFL for tasks assessed/performed Overall Cognitive Status: Impaired/Different from baseline Area of Impairment: Orientation, Memory                 Orientation Level: Disoriented to, Time, Situation   Memory: Decreased short-term memory         General Comments: pt able to state name and birthday, followed most simple commands with increased time for processing. Disoriented to year, president and situation. Per chart review, pt with probable undiagnosed dementia.        General Comments General comments (skin integrity, edema, etc.): VSS on RA    Exercises     Assessment/Plan    PT Assessment Patient needs continued PT services  PT Problem List Decreased strength;Decreased activity tolerance;Decreased balance;Decreased mobility;Decreased knowledge of use of DME;Decreased safety awareness;Decreased knowledge of precautions;Pain       PT Treatment Interventions DME instruction;Gait training;Functional mobility training;Therapeutic activities;Therapeutic exercise;Balance training;Patient/family education;Wheelchair mobility training    PT Goals (Current goals can be found in the Care Plan section)  Acute Rehab PT Goals Patient Stated Goal: None stated PT Goal Formulation: With patient Time For Goal Achievement: 06/03/23 Potential to Achieve Goals: Good    Frequency Min 1X/week     Co-evaluation   Reason for  Co-Treatment: Complexity of the patient's impairments (multi-system involvement);For patient/therapist safety;To address functional/ADL transfers   OT goals addressed during session: ADL's and self-care       AM-PAC PT "6 Clicks" Mobility  Outcome Measure Help needed turning from your back to your side while in a flat bed without using bedrails?: A Little Help needed moving from lying on your back to sitting on the side of a flat bed without using bedrails?: A Little Help needed moving to and from a bed to a chair (including a wheelchair)?: A Little Help needed standing up from a chair using your arms (e.g., wheelchair or bedside chair)?: A Little Help needed to walk in hospital room?: Total Help needed climbing 3-5 steps with a railing? : Total 6 Click Score: 14    End of Session Equipment Utilized During Treatment: Gait belt Activity Tolerance: Patient tolerated treatment well Patient left: in chair;with call bell/phone within reach;with chair alarm set;with nursing/sitter in room Nurse Communication: Mobility status PT Visit Diagnosis: Unsteadiness on feet (R26.81);Difficulty in walking, not elsewhere classified (R26.2)    Time: 3086-5784 PT Time Calculation (min) (ACUTE ONLY): 22 min   Charges:   PT Evaluation $PT Eval Low Complexity: 1 Low   PT General Charges $$  ACUTE PT VISIT: 1 Visit         Conni Slipper, PT, DPT Acute Rehabilitation Services Secure Chat Preferred Office: [phone]   Marylynn Pearson 05/20/2023, 1:00 PM

## 2023-05-20 NOTE — Evaluation (Signed)
Occupational Therapy Evaluation Patient Details Name: Austin Weaver MRN: [id number] DOB: Mar 17, 1946 Today's Date: 05/20/2023   History of Present Illness Pt is a 77 y/o male who presents 05/17/2023 from [facility] where he is a long term resident withabrupt onset of confusion, garbled speech, and R sided weakness and facial droop. Admitted for sepsis of unclear etiology. CT and MRI scans negative for acute changes. PMH significant for TIA, lacunar strokes, HTN.   Clinical Impression   Jerryd was evaluated s/p the above admission list. He is from [place], [place] and reports walking some with a RW but mostly mobilizing at [facility] level at baseline. Upon evaluation the pt was limited by impaired cognition, questionable historian, generalized weakness, unsteady gait and decreased activity tolerance. Overall he needed min A for all mobility and tolerated room ambulation with the RW. Due to the deficits listed below the pt also needs up to mod A for LB ADLs and min A for UB ADLs with cues for all functional tasks. Pt will benefit from continued acute OT services and skilled inpatient follow up therapy, <3 hours/day.        If plan is discharge home, recommend the following: A little help with walking and/or transfers;A little help with bathing/dressing/bathroom;Assistance with cooking/housework;Direct supervision/assist for medications management;Direct supervision/assist for financial management;Assist for transportation;Help with stairs or ramp for entrance;Supervision due to cognitive status    Functional Status Assessment  Patient has had a recent decline in their functional status and demonstrates the ability to make significant improvements in function in a reasonable and predictable amount of time.  Equipment Recommendations  None recommended by OT    Recommendations for Other Services       Precautions / Restrictions Precautions Precautions: Fall Restrictions Weight Bearing Restrictions:  No      Mobility Bed Mobility Overal bed mobility: Needs Assistance Bed Mobility: Supine to Sit     Supine to sit: Min assist          Transfers Overall transfer level: Needs assistance Equipment used: Rolling walker (2 wheels) Transfers: Sit to/from Stand Sit to Stand: Min assist           General transfer comment: a few LOBs throughout the session      Balance Overall balance assessment: Needs assistance Sitting-balance support: Feet supported Sitting balance-Leahy Scale: Fair Sitting balance - Comments: did not challenge sitting balance today   Standing balance support: During functional activity, Single extremity supported Standing balance-Leahy Scale: Fair Standing balance comment: statically with UE supporting standing pericare                           ADL either performed or assessed with clinical judgement   ADL Overall ADL's : Needs assistance/impaired Eating/Feeding: Independent;Sitting   Grooming: Set up;Sitting   Upper Body Bathing: Minimal assistance;Sitting Upper Body Bathing Details (indicate cue type and reason): for cues Lower Body Bathing: Minimal assistance;Sit to/from stand   Upper Body Dressing : Minimal assistance;Sitting   Lower Body Dressing: Moderate assistance;Sitting/lateral leans   Toilet Transfer: Minimal assistance;Ambulation;Rolling walker (2 wheels);Regular Toilet   Toileting- Clothing Manipulation and Hygiene: Minimal assistance;Sit to/from stand Toileting - Clothing Manipulation Details (indicate cue type and reason): residual BM noted, min A for thorough cleaning     Functional mobility during ADLs: Minimal assistance;Cueing for safety;Rolling walker (2 wheels) General ADL Comments: needs cues for cog and safety, mildly self-limiting and states "they don't want me to be on my feet" several times throughout  the sesssion     Vision Baseline Vision/History: 0 No visual deficits Vision Assessment?: No  apparent visual deficits     Perception Perception: Not tested       Praxis Praxis: Not tested       Pertinent Vitals/Pain       Extremity/Trunk Assessment Upper Extremity Assessment Upper Extremity Assessment: Generalized weakness   Lower Extremity Assessment Lower Extremity Assessment: Defer to PT evaluation   Cervical / Trunk Assessment Cervical / Trunk Assessment: Normal   Communication Communication Communication: No apparent difficulties   Cognition Arousal: Alert Behavior During Therapy: WFL for tasks assessed/performed Overall Cognitive Status: Impaired/Different from baseline Area of Impairment: Orientation, Memory                 Orientation Level: Disoriented to, Time, Situation   Memory: Decreased short-term memory         General Comments: pt able to state name and birthday, followed most simple commands with increased time for processing. Disoriented to year, president and situatio. Per chart review, pt with probable undiagnosed dementia.     General Comments  VSS on RA    Exercises     Shoulder Instructions      Home Living Family/patient expects to be discharged to:: Skilled nursing facility                                 Additional Comments: Lacinda Axon, pt states he has a RW and WC.      Prior Functioning/Environment Prior Level of Function : Needs assist;Patient poor historian/Family not available             Mobility Comments: questionable historian, reports indep transfers to [facility] and able to use RW ADLs Comments: questionable historian, anticipate staff assists with ADLs        OT Problem List: Decreased strength;Decreased range of motion;Decreased activity tolerance;Impaired balance (sitting and/or standing);Decreased safety awareness;Decreased knowledge of use of DME or AE;Decreased knowledge of precautions      OT Treatment/Interventions: Self-care/ADL training;DME and/or AE instruction;Therapeutic  activities;Balance training;Patient/family education    OT Goals(Current goals can be found in the care plan section) Acute Rehab OT Goals Patient Stated Goal: to lay down OT Goal Formulation: With patient Time For Goal Achievement: 06/03/23 Potential to Achieve Goals: Good ADL Goals Pt Will Perform Grooming: standing;with supervision Pt Will Perform Lower Body Dressing: with supervision;sit to/from stand Pt Will Transfer to Toilet: with supervision;ambulating;regular height toilet Pt Will Perform Toileting - Clothing Manipulation and hygiene: with modified independence;sitting/lateral leans  OT Frequency: Min 1X/week    Co-evaluation PT/OT/SLP Co-Evaluation/Treatment: Yes Reason for Co-Treatment: Complexity of the patient's impairments (multi-system involvement);For patient/therapist safety;To address functional/ADL transfers   OT goals addressed during session: ADL's and self-care      AM-PAC OT "6 Clicks" Daily Activity     Outcome Measure Help from another person eating meals?: None Help from another person taking care of personal grooming?: A Little Help from another person toileting, which includes using toliet, bedpan, or urinal?: A Little Help from another person bathing (including washing, rinsing, drying)?: A Little Help from another person to put on and taking off regular upper body clothing?: A Little Help from another person to put on and taking off regular lower body clothing?: A Lot 6 Click Score: 18   End of Session Equipment Utilized During Treatment: Gait belt;Rolling walker (2 wheels) Nurse Communication: Mobility status  Activity Tolerance: Patient tolerated  treatment well Patient left: in chair;with call bell/phone within reach;with chair alarm set  OT Visit Diagnosis: Unsteadiness on feet (R26.81);Other abnormalities of gait and mobility (R26.89);Muscle weakness (generalized) (M62.81)                Time: 1610-9604 OT Time Calculation (min): 22  min Charges:  OT General Charges $OT Visit: 1 Visit OT Evaluation $OT Eval Moderate Complexity: 1 Mod  Derenda Mis, OTR/L Acute Rehabilitation Services Office [phone] Secure Chat Communication Preferred   Donia Pounds 05/20/2023, 12:39 PM

## 2023-05-21 DIAGNOSIS — E785 Hyperlipidemia, unspecified: Secondary | ICD-10-CM | POA: Diagnosis not present

## 2023-05-21 DIAGNOSIS — I10 Essential (primary) hypertension: Secondary | ICD-10-CM | POA: Diagnosis not present

## 2023-05-21 DIAGNOSIS — J324 Chronic pansinusitis: Secondary | ICD-10-CM | POA: Diagnosis not present

## 2023-05-21 DIAGNOSIS — F0280 Dementia in other diseases classified elsewhere without behavioral disturbance: Secondary | ICD-10-CM | POA: Diagnosis not present

## 2023-05-21 DIAGNOSIS — A419 Sepsis, unspecified organism: Secondary | ICD-10-CM | POA: Diagnosis not present

## 2023-05-21 DIAGNOSIS — G9341 Metabolic encephalopathy: Secondary | ICD-10-CM | POA: Diagnosis not present

## 2023-05-21 DIAGNOSIS — R6520 Severe sepsis without septic shock: Secondary | ICD-10-CM | POA: Diagnosis not present

## 2023-05-21 DIAGNOSIS — Z8673 Personal history of transient ischemic attack (TIA), and cerebral infarction without residual deficits: Secondary | ICD-10-CM | POA: Diagnosis not present

## 2023-05-21 DIAGNOSIS — G309 Alzheimer's disease, unspecified: Secondary | ICD-10-CM | POA: Diagnosis not present

## 2023-05-21 DIAGNOSIS — Z7982 Long term (current) use of aspirin: Secondary | ICD-10-CM | POA: Diagnosis not present

## 2023-05-21 DIAGNOSIS — I779 Disorder of arteries and arterioles, unspecified: Secondary | ICD-10-CM | POA: Diagnosis not present

## 2023-05-21 MED ORDER — FLUTICASONE PROPIONATE 50 MCG/ACT NA SUSP: 2.0000 | Freq: Every day | NASAL | Status: DC

## 2023-05-21 MED ORDER — AMOXICILLIN-POT CLAVULANATE 875-125 MG PO TABS: 1.0000 | ORAL_TABLET | Freq: Two times a day (BID) | ORAL | Status: AC

## 2023-05-21 MED ORDER — AMOXICILLIN-POT CLAVULANATE 875-125 MG PO TABS: 1.0000 | ORAL_TABLET | Freq: Two times a day (BID) | ORAL | 0 refills | Status: DC

## 2023-05-21 NOTE — Discharge Summary (Signed)
Physician Discharge Summary   Patient: Austin Weaver MRN: [id number] DOB: 1946/07/28  Admit date:     05/17/2023  Discharge date: 05/21/23  Discharge Physician: Tyrone Nine   PCP: Mort Sawyers, FNP   Recommendations at discharge:  Continue augmentin for paranasal sinusitis at SNF.  Monitor BMP and CBC at follow up with PCP. Consider neurocognitive evaluation as outpatient and initiating treatment (e.g. aricept) if cognitive impairment is significant after resolution of acute infection.  Discharge Diagnoses: Principal Problem:   Sepsis (HCC) Active Problems:   Old Cerebral infarction [facility])   History of stroke   Acute metabolic encephalopathy   Alzheimer's dementia with behavioral disturbance [facility])   Essential hypertension   Mixed hyperlipidemia  Hospital Course: Austin Weaver is a 77 y.o. male with a history of CVA, HTN, falls who presented to the ED from SNF on 05/17/2023 due to abrupt onset of confusion, garbled speech, reported to have had right-sided weakness and facial droop, arrived as code stroke. Head CT showed no acute findings. Symptoms began improving. He was febrile to 100.5F with leukocytosis (WBC 10.9k) and tachycardic (HR 120's). Broad antibiotics and 2.5L IVF given for sepsis of undetermined etiology. Blood cultures were drawn, urinalysis showed no WBC or bacteria. MR brain showed evidence of purulent acute paranasal sinusitis, chronically occluded right vertebral artery, and advanced chronic small vessel disease without acute intracranial abnormality.   Assessment and Plan: Sepsis: Remains of unclear etiology, possibly ABRS. No pyuria or bacteriuria on UA (would still expect at least some abnormal finding despite being collected shortly after antibiotics given). No respiratory symptoms or pulmonary exam abnormalities. I see no cellulitis or wounds on feet. CXR clear, urine culture with nonclonal growth.  - Purulent paranasal sinus infection is primary suspected  source. LP with red cloudy CSF, protein 75, glucose 50, WBC 3, RBC 1,820. No organisms on gram stain, negative viral panel. CSF Cx's NGTD. - Converted to unasyn, no further fevers, mentation improving. Blood cultures NGTD.  Complete course with augmentin, give flonase to aid drainage.    Altered mental status (acute on chronic):  - High suspicion, but not formally diagnosed with dementia PTA. Unable to reach legal guardian by phone. - EEG with diffuse slowing, mild nonspecific encephalopathy.  - MR brain as above   HLD:  - Continue statin   HTN:  - Continue norvasc. ARB not on MAR. HCTZ 12.5mg  is noted. Suggest monitoring BMP.   History of CVA, chronic right vertebral artery occlusion: - Continue aspirin, continue statin     Hypokalemia:  - Supplemented  Consultants: Neurology Procedures performed: Lumbar puncture  Disposition: Skilled nursing facility Diet recommendation:  Cardiac diet DISCHARGE MEDICATION: Allergies as of 05/21/2023   No Known Allergies      Medication List     STOP taking these medications    losartan 25 MG tablet Commonly known as: COZAAR       TAKE these medications    amLODipine 10 MG tablet Commonly known as: NORVASC Take 1 tablet (10 mg total) by mouth daily.   amoxicillin-clavulanate 875-125 MG tablet Commonly known as: AUGMENTIN Take 1 tablet by mouth 2 (two) times daily for 6 days.   aspirin EC 81 MG tablet Take 81 mg by mouth daily. Swallow whole.   atorvastatin 40 MG tablet Commonly known as: LIPITOR Take 1 tablet (40 mg total) by mouth daily.   hydrochlorothiazide 12.5 MG tablet Commonly known as: HYDRODIURIL Take 12.5 mg by mouth daily.        Contact  information for follow-up providers     Mort Sawyers, FNP Follow up.   Specialty: Family Medicine Contact information: [address] [place] [address] [phone]              Contact information for after-discharge care     Destination      [facility] .   Service: Skilled Nursing Contact information: [address] [place] [address] [phone]                    Discharge Exam: Ceasar Mons Weights   05/17/23 1900 05/18/23 1537  Weight: 72.8 kg 72.8 kg  BP 122/76 (BP Location: Left Arm)   Pulse 68   Temp 98.1 F (36.7 C) (Oral)   Resp 19   Ht 5' 4.5" (1.638 m)   Wt 72.8 kg   SpO2 100%   BMI 27.12 kg/m   Alert, still with stuttering speech but making jokes with me, recognizes me, likes the room they moved him to yesterday. No focal deficits. He feels good for discharge without any pain. Nonlabored, clear RRR, no MRG or edema  Condition at discharge: stable  The results of significant diagnostics from this hospitalization (including imaging, microbiology, ancillary and laboratory) are listed below for reference.   Imaging Studies: EEG adult  Result Date: May 30, 2023 Charlsie Quest, MD     2023-05-30  6:22 AM Patient Name: Austin Weaver MRN: [id number] Epilepsy Attending: Charlsie Quest Referring Physician/Provider: Marjorie Smolder, NP Date: 05/18/2023 Duration: 23.09 mins Patient history: 77 year old patient with history of allergic rhinitis, TIA, lacunar strokes and hypertension presented from his SNF with sudden onset of confusion and garbled speech with possible right-sided weakness and facial droop. EEG to evaluate for seizure Level of alertness: Awake AEDs during EEG study: None Technical aspects: This EEG study was done with scalp electrodes positioned according to the 10-20 International system of electrode placement. Electrical activity was reviewed with band pass filter of 1-70Hz , sensitivity of 7 uV/mm, display speed of 38mm/sec with a 60Hz  notched filter applied as appropriate. EEG data were recorded continuously and digitally stored.  Video monitoring was available and reviewed as appropriate. Description: The posterior dominant rhythm consists of 8Hz  activity of  moderate voltage (25-35 uV) seen predominantly in posterior head regions, symmetric and reactive to eye opening and eye closing. EEG showed intermittent generalized 3 to 6 Hz theta-delta slowing. Hyperventilation and photic stimulation were not performed.   ABNORMALITY - Intermittent slow, generalized IMPRESSION: This study is suggestive of mild diffuse encephalopathy, nonspecific etiology. No seizures or epileptiform discharges were seen throughout the recording. Priyanka Annabelle Harman   DG FL GUIDED LUMBAR PUNCTURE  Result Date: 05/18/2023 CLINICAL DATA:  Patient presented with altered mental status. Diagnostic lumbar puncture requested. EXAM: DIAGNOSTIC LUMBAR PUNCTURE UNDER FLUOROSCOPIC GUIDANCE COMPARISON:  None Available. FLUOROSCOPY: Radiation Exposure Index (as provided by the fluoroscopic device): 23.20 mGy Kerma PROCEDURE: Emergency consent obtained from two physicians. With the patient prone, the lower back was prepped with Betadine. 1% Lidocaine was used for local anesthesia. Lumbar puncture was performed at the L5-S1 level using a 20 gauge needle with return of lightly blood-tinged CSF with an opening pressure of 15 cm water. 10 ml of CSF were obtained for laboratory studies. The patient tolerated the procedure well and there were no apparent complications. IMPRESSION: Technically successful L5-S1 lumbar puncture yielding 10 mL of lightly blood-tinged CSF for laboratory studies. Procedure performed by Alwyn Ren NP, in room supervision  of critical portions of the procedure provided by Dr. Gaylyn Rong. Electronically Signed   By: Gaylyn Rong M.D.   On: 05/18/2023 16:38   DG CHEST PORT 1 VIEW  Result Date: 05/18/2023 CLINICAL DATA:  Sepsis EXAM: PORTABLE CHEST 1 VIEW COMPARISON:  10/05/2021 FINDINGS: Transverse diameter of heart is increased. Thoracic aorta is tortuous and ectatic. There are no signs of pulmonary edema or focal pulmonary consolidation. There are small linear densities  in right lower lung field. There is no significant pleural effusion or pneumothorax. IMPRESSION: There are no signs of pulmonary edema or focal pulmonary consolidation. Small linear densities in the right lower lung field may suggest subsegmental atelectasis. Electronically Signed   By: Ernie Avena M.D.   On: 05/18/2023 15:17   MR BRAIN WO CONTRAST  Result Date: 05/18/2023 CLINICAL DATA:  77 year old male code stroke presentation yesterday. Weakness. EXAM: MRI HEAD WITHOUT CONTRAST TECHNIQUE: Multiplanar, multiecho pulse sequences of the brain and surrounding structures were obtained without intravenous contrast. COMPARISON:  Head CT yesterday.  Brain MRI 10/06/2021. CTA head and neck 11/21/2017 FINDINGS: Brain: Stable cerebral volume. No restricted diffusion or evidence of acute infarction. No restricted diffusion to suggest acute infarction. No midline shift, mass effect, evidence of mass lesion, ventriculomegaly, extra-axial collection or acute intracranial hemorrhage. Cervicomedullary junction and pituitary are within normal limits. Chronic confluent, asymmetric cerebral white matter T2 and FLAIR hyperintensity is greater in the right hemisphere. Superimposed chronic lacunar infarct of the right external capsule. Chronic thalamic lacunar infarcts are stable. Occasional chronic micro hemorrhages in the brain (right lentiform, left periatrial white matter. Microhemorrhage in the pons to the right of midline was not apparent last year but might have been obscured on SWI. And surrounding chronic pontine T2/FLAIR hyperintensity is stable. Small chronic lacunar infarct in the left deep cerebellar nucleus with hemosiderin is stable. Vascular: Major intracranial vascular flow voids are stable, chronically absent distal right vertebral artery flow void (series 10, image 5), and that vessel was occluded on the 2019 CTA. Skull and upper cervical spine: Visualized bone marrow signal is within normal limits.  Partially visible cervical spine degeneration. Sinuses/Orbits: Underlying hyperplastic paranasal sinuses. Bilateral paranasal sinus fluid levels, significant sphenoid and ethmoid opacification as seen by CT yesterday. But the sinus fluid also is restricted on diffusion suggesting purulence (right frontal sinus series 5, image 73). Still, orbits soft tissues appear to remain normal and no complicating features are identified. Other: Mastoids remain clear. Visible internal auditory structures appear normal. Negative visible scalp and face. IMPRESSION: 1. Evidence of Purulent acute paranasal sinusitis. Stable fluid levels and opacification of congenitally hyperplastic paranasal sinuses since the Head CT yesterday. No complicating features identified on this noncontrast exam. 2. No other acute intracranial abnormality. Advanced chronic small vessel disease not significantly changed from an MRI last year. Chronically occluded right vertebral artery. Electronically Signed   By: Odessa Fleming M.D.   On: 05/18/2023 04:25   CT HEAD CODE STROKE WO CONTRAST  Result Date: 05/17/2023 CLINICAL DATA:  Code stroke.  Weakness EXAM: CT HEAD WITHOUT CONTRAST TECHNIQUE: Contiguous axial images were obtained from the base of the skull through the vertex without intravenous contrast. RADIATION DOSE REDUCTION: This exam was performed according to the departmental dose-optimization program which includes automated exposure control, adjustment of the mA and/or kV according to patient size and/or use of iterative reconstruction technique. COMPARISON:  10/05/2021 FINDINGS: Brain: There is no mass, hemorrhage or extra-axial collection. There is generalized atrophy without lobar predilection. There is hypoattenuation of  the periventricular white matter, most commonly indicating chronic ischemic microangiopathy. Old right basal ganglia small vessel infarct. Vascular: No abnormal hyperdensity of the major intracranial arteries or dural venous  sinuses. No intracranial atherosclerosis. Skull: The visualized skull base, calvarium and extracranial soft tissues are normal. Sinuses/Orbits: No fluid levels or advanced mucosal thickening of the visualized paranasal sinuses. No mastoid or middle ear effusion. The orbits are normal. ASPECTS [facility] Stroke Program Early CT Score) - Ganglionic level infarction (caudate, lentiform nuclei, internal capsule, insula, M1-M3 cortex): 7 - Supraganglionic infarction (M4-M6 cortex): 3 Total score (0-10 with 10 being normal): 10 IMPRESSION: 1. No acute intracranial abnormality. 2. Atrophy and advanced chronic ischemic microangiopathy. 3. ASPECTS is 10. These results were communicated to Dr. Caryl Pina at 7:20 pm on 05/17/2023 by text page via the [facility] messaging system. Electronically Signed   By: Deatra Robinson M.D.   On: 05/17/2023 19:20    Microbiology: Results for orders placed or performed during the hospital encounter of 05/17/23  Blood culture (routine x 2)     Status: None (Preliminary result)   Collection Time: 05/17/23  8:51 PM   Specimen: BLOOD RIGHT HAND  Result Value Ref Range Status   Specimen Description BLOOD RIGHT HAND  Final   Special Requests   Final    BOTTLES DRAWN AEROBIC AND ANAEROBIC Blood Culture adequate volume   Culture   Final    NO GROWTH 3 DAYS Performed at [facility], [address], [place], [place] [address]    Report Status PENDING  Incomplete  Blood culture (routine x 2)     Status: None (Preliminary result)   Collection Time: 05/17/23  9:34 PM   Specimen: BLOOD RIGHT ARM  Result Value Ref Range Status   Specimen Description BLOOD RIGHT ARM  Final   Special Requests   Final    BOTTLES DRAWN AEROBIC AND ANAEROBIC Blood Culture adequate volume   Culture   Final    NO GROWTH 3 DAYS Performed at [facility], [address] [place], [place] [address]    Report Status PENDING  Incomplete  Urine Culture     Status: Abnormal   Collection Time: 05/17/23  10:04 PM   Specimen: Urine, Clean Catch  Result Value Ref Range Status   Specimen Description URINE, CLEAN CATCH  Final   Special Requests   Final    NONE Performed at [facility], [address], [place], [place] [address]    Culture MULTIPLE SPECIES PRESENT, SUGGEST RECOLLECTION (A)  Final   Report Status 05/19/2023 FINAL  Final  CSF culture w Gram Stain     Status: None (Preliminary result)   Collection Time: 05/18/23  4:15 PM   Specimen: PATH Cytology CSF; Cerebrospinal Fluid  Result Value Ref Range Status   Specimen Description CSF  Final   Special Requests NONE  Final   Gram Stain   Final    WBC PRESENT, PREDOMINANTLY MONONUCLEAR NO ORGANISMS SEEN CYTOSPIN SMEAR    Culture   Final    NO GROWTH 2 DAYS Performed at [facility], [address], [place], [place] [address]    Report Status PENDING  Incomplete    Labs: CBC: Recent Labs  Lab 05/17/23 1908 05/17/23 1912 05/18/23 0415 05/19/23 0359  WBC 10.9*  --  8.3 7.9  NEUTROABS 9.1*  --   --   --   HGB 15.9 17.0 15.1 13.7  HCT 47.9 50.0 45.8 40.2  MCV  88.1  --  88.9 87.4  PLT 191  --  160 179   Basic Metabolic Panel: Recent Labs  Lab 05/17/23 1908 05/17/23 1912 05/18/23 0415 05/19/23 0359  NA 136 136 136 134*  K 3.6 3.5 3.5 3.4*  CL 98 101 98 101  CO2 21*  --  25 20*  GLUCOSE 116* 119* 98 89  BUN 13 15 10 13   CREATININE 1.05 0.90 0.93 0.97  CALCIUM 9.2  --  8.5* 8.1*   Liver Function Tests: Recent Labs  Lab 05/17/23 1908 05/18/23 0415  AST 16 16  ALT 12 12  ALKPHOS 82 68  BILITOT 1.2 0.9  PROT 8.0 6.8  ALBUMIN 3.8 3.0*   CBG: Recent Labs  Lab 05/17/23 1902 05/18/23 2106  GLUCAP 105* 105*    Discharge time spent: greater than 30 minutes.  Signed: Tyrone Nine, MD [facility] 05/21/2023

## 2023-05-21 NOTE — TOC Transition Note (Signed)
Transition of Care [facility] - CM/SW Discharge Note   Patient Details  Name: Austin Weaver MRN: [id number] Date of Birth: [birth year]  Transition of Care [facility]) CM/SW Contact:  [name], LCSW Phone Number: 05/21/2023, 11:48 AM   Clinical Narrative:      Per MD patient ready for DC to [facility]. RN, patient, and facility notified of DC. Called DSS guardian, [name], who's voicemail said to call [name]). CSW left confidential voicemail with DSS social worker [name] notifying of DC. Discharge Summary and FL2 sent to facility. RN to call report prior to discharge [phone]). DC packet on chart. Ambulance transport requested for patient.   CSW will sign off for now as social work intervention is no longer needed. Please consult [place] again if new needs arise.   Final next level of care: Skilled Nursing Facility Barriers to Discharge: No Barriers Identified   Patient Goals and CMS Choice CMS Medicare.gov Compare Post Acute Care list provided to:: Legal Guardian Choice offered to / list presented to : [facility] POA / Guardian   Discharge Plan and Services Additional resources added to the After Visit Summary for       Post Acute Care Choice: Skilled Nursing Facility                               Social Determinants of Health (SDOH) Interventions SDOH Screenings   Depression (PHQ2-9): Low Risk  (07/11/2022)  Financial Resource Strain: Medium Risk (12/01/2018)  Tobacco Use: High Risk (05/17/2023)     Readmission Risk Interventions     No data to display

## 2023-05-21 NOTE — Progress Notes (Signed)
Called the report for SNF

## 2023-05-21 NOTE — Plan of Care (Signed)

## 2023-05-22 ENCOUNTER — Encounter (INDEPENDENT_AMBULATORY_CARE_PROVIDER_SITE_OTHER): Payer: Medicare HMO

## 2023-05-22 ENCOUNTER — Encounter (INDEPENDENT_AMBULATORY_CARE_PROVIDER_SITE_OTHER): Payer: Medicare HMO | Admitting: Nurse Practitioner

## 2023-05-29 DIAGNOSIS — Z8673 Personal history of transient ischemic attack (TIA), and cerebral infarction without residual deficits: Secondary | ICD-10-CM | POA: Diagnosis not present

## 2023-05-29 DIAGNOSIS — F0280 Dementia in other diseases classified elsewhere without behavioral disturbance: Secondary | ICD-10-CM | POA: Diagnosis not present

## 2023-05-29 DIAGNOSIS — I10 Essential (primary) hypertension: Secondary | ICD-10-CM | POA: Diagnosis not present

## 2023-05-29 DIAGNOSIS — G309 Alzheimer's disease, unspecified: Secondary | ICD-10-CM | POA: Diagnosis not present

## 2023-05-29 DIAGNOSIS — Z8619 Personal history of other infectious and parasitic diseases: Secondary | ICD-10-CM | POA: Diagnosis not present

## 2023-05-29 DIAGNOSIS — J324 Chronic pansinusitis: Secondary | ICD-10-CM | POA: Diagnosis not present

## 2023-06-18 DIAGNOSIS — D649 Anemia, unspecified: Secondary | ICD-10-CM | POA: Diagnosis not present

## 2023-06-18 DIAGNOSIS — E785 Hyperlipidemia, unspecified: Secondary | ICD-10-CM | POA: Diagnosis not present

## 2023-06-18 DIAGNOSIS — E559 Vitamin D deficiency, unspecified: Secondary | ICD-10-CM | POA: Diagnosis not present

## 2023-06-18 DIAGNOSIS — I10 Essential (primary) hypertension: Secondary | ICD-10-CM | POA: Diagnosis not present

## 2023-06-19 DIAGNOSIS — E559 Vitamin D deficiency, unspecified: Secondary | ICD-10-CM | POA: Diagnosis not present

## 2023-06-19 DIAGNOSIS — I69318 Other symptoms and signs involving cognitive functions following cerebral infarction: Secondary | ICD-10-CM | POA: Diagnosis not present

## 2023-06-19 DIAGNOSIS — R946 Abnormal results of thyroid function studies: Secondary | ICD-10-CM | POA: Diagnosis not present

## 2023-07-08 DIAGNOSIS — E559 Vitamin D deficiency, unspecified: Secondary | ICD-10-CM | POA: Diagnosis not present

## 2023-07-09 DIAGNOSIS — Z23 Encounter for immunization: Secondary | ICD-10-CM | POA: Diagnosis not present

## 2023-07-13 DIAGNOSIS — E559 Vitamin D deficiency, unspecified: Secondary | ICD-10-CM | POA: Diagnosis not present

## 2023-07-24 DIAGNOSIS — R634 Abnormal weight loss: Secondary | ICD-10-CM | POA: Diagnosis not present

## 2023-07-24 DIAGNOSIS — E785 Hyperlipidemia, unspecified: Secondary | ICD-10-CM | POA: Diagnosis not present

## 2023-07-24 DIAGNOSIS — F17201 Nicotine dependence, unspecified, in remission: Secondary | ICD-10-CM | POA: Diagnosis not present

## 2023-07-24 DIAGNOSIS — I10 Essential (primary) hypertension: Secondary | ICD-10-CM | POA: Diagnosis not present

## 2023-07-24 DIAGNOSIS — G309 Alzheimer's disease, unspecified: Secondary | ICD-10-CM | POA: Diagnosis not present

## 2023-07-24 DIAGNOSIS — Z7982 Long term (current) use of aspirin: Secondary | ICD-10-CM | POA: Diagnosis not present

## 2023-07-24 DIAGNOSIS — I779 Disorder of arteries and arterioles, unspecified: Secondary | ICD-10-CM | POA: Diagnosis not present

## 2023-07-24 DIAGNOSIS — F0280 Dementia in other diseases classified elsewhere without behavioral disturbance: Secondary | ICD-10-CM | POA: Diagnosis not present

## 2023-07-24 DIAGNOSIS — J329 Chronic sinusitis, unspecified: Secondary | ICD-10-CM | POA: Diagnosis not present

## 2023-07-30 DIAGNOSIS — R41841 Cognitive communication deficit: Secondary | ICD-10-CM | POA: Diagnosis not present

## 2023-07-30 DIAGNOSIS — G309 Alzheimer's disease, unspecified: Secondary | ICD-10-CM | POA: Diagnosis not present

## 2023-08-08 DIAGNOSIS — M6281 Muscle weakness (generalized): Secondary | ICD-10-CM | POA: Diagnosis not present

## 2023-08-08 DIAGNOSIS — R2689 Other abnormalities of gait and mobility: Secondary | ICD-10-CM | POA: Diagnosis not present

## 2023-08-08 DIAGNOSIS — I639 Cerebral infarction, unspecified: Secondary | ICD-10-CM | POA: Diagnosis not present

## 2023-08-08 DIAGNOSIS — R278 Other lack of coordination: Secondary | ICD-10-CM | POA: Diagnosis not present

## 2023-08-08 DIAGNOSIS — G309 Alzheimer's disease, unspecified: Secondary | ICD-10-CM | POA: Diagnosis not present

## 2023-08-09 DIAGNOSIS — G309 Alzheimer's disease, unspecified: Secondary | ICD-10-CM | POA: Diagnosis not present

## 2023-08-09 DIAGNOSIS — M6281 Muscle weakness (generalized): Secondary | ICD-10-CM | POA: Diagnosis not present

## 2023-08-09 DIAGNOSIS — I639 Cerebral infarction, unspecified: Secondary | ICD-10-CM | POA: Diagnosis not present

## 2023-08-09 DIAGNOSIS — R278 Other lack of coordination: Secondary | ICD-10-CM | POA: Diagnosis not present

## 2023-08-09 DIAGNOSIS — R2689 Other abnormalities of gait and mobility: Secondary | ICD-10-CM | POA: Diagnosis not present

## 2023-08-10 DIAGNOSIS — G309 Alzheimer's disease, unspecified: Secondary | ICD-10-CM | POA: Diagnosis not present

## 2023-08-10 DIAGNOSIS — I69354 Hemiplegia and hemiparesis following cerebral infarction affecting left non-dominant side: Secondary | ICD-10-CM | POA: Diagnosis not present

## 2023-08-10 DIAGNOSIS — I639 Cerebral infarction, unspecified: Secondary | ICD-10-CM | POA: Diagnosis not present

## 2023-08-10 DIAGNOSIS — E559 Vitamin D deficiency, unspecified: Secondary | ICD-10-CM | POA: Diagnosis not present

## 2023-08-10 DIAGNOSIS — I10 Essential (primary) hypertension: Secondary | ICD-10-CM | POA: Diagnosis not present

## 2023-08-10 DIAGNOSIS — R2689 Other abnormalities of gait and mobility: Secondary | ICD-10-CM | POA: Diagnosis not present

## 2023-08-10 DIAGNOSIS — Z7982 Long term (current) use of aspirin: Secondary | ICD-10-CM | POA: Diagnosis not present

## 2023-08-10 DIAGNOSIS — F02B0 Dementia in other diseases classified elsewhere, moderate, without behavioral disturbance, psychotic disturbance, mood disturbance, and anxiety: Secondary | ICD-10-CM | POA: Diagnosis not present

## 2023-08-10 DIAGNOSIS — E785 Hyperlipidemia, unspecified: Secondary | ICD-10-CM | POA: Diagnosis not present

## 2023-08-10 DIAGNOSIS — R278 Other lack of coordination: Secondary | ICD-10-CM | POA: Diagnosis not present

## 2023-08-10 DIAGNOSIS — M6281 Muscle weakness (generalized): Secondary | ICD-10-CM | POA: Diagnosis not present

## 2023-08-11 DIAGNOSIS — I639 Cerebral infarction, unspecified: Secondary | ICD-10-CM | POA: Diagnosis not present

## 2023-08-11 DIAGNOSIS — R2689 Other abnormalities of gait and mobility: Secondary | ICD-10-CM | POA: Diagnosis not present

## 2023-08-11 DIAGNOSIS — M6281 Muscle weakness (generalized): Secondary | ICD-10-CM | POA: Diagnosis not present

## 2023-08-11 DIAGNOSIS — R278 Other lack of coordination: Secondary | ICD-10-CM | POA: Diagnosis not present

## 2023-08-11 DIAGNOSIS — G309 Alzheimer's disease, unspecified: Secondary | ICD-10-CM | POA: Diagnosis not present

## 2023-08-12 DIAGNOSIS — R278 Other lack of coordination: Secondary | ICD-10-CM | POA: Diagnosis not present

## 2023-08-12 DIAGNOSIS — M6281 Muscle weakness (generalized): Secondary | ICD-10-CM | POA: Diagnosis not present

## 2023-08-12 DIAGNOSIS — G309 Alzheimer's disease, unspecified: Secondary | ICD-10-CM | POA: Diagnosis not present

## 2023-08-12 DIAGNOSIS — R2689 Other abnormalities of gait and mobility: Secondary | ICD-10-CM | POA: Diagnosis not present

## 2023-08-12 DIAGNOSIS — I639 Cerebral infarction, unspecified: Secondary | ICD-10-CM | POA: Diagnosis not present

## 2023-08-13 DIAGNOSIS — G309 Alzheimer's disease, unspecified: Secondary | ICD-10-CM | POA: Diagnosis not present

## 2023-08-13 DIAGNOSIS — I639 Cerebral infarction, unspecified: Secondary | ICD-10-CM | POA: Diagnosis not present

## 2023-08-13 DIAGNOSIS — R2689 Other abnormalities of gait and mobility: Secondary | ICD-10-CM | POA: Diagnosis not present

## 2023-08-13 DIAGNOSIS — M6281 Muscle weakness (generalized): Secondary | ICD-10-CM | POA: Diagnosis not present

## 2023-08-13 DIAGNOSIS — R278 Other lack of coordination: Secondary | ICD-10-CM | POA: Diagnosis not present

## 2023-08-14 DIAGNOSIS — R2689 Other abnormalities of gait and mobility: Secondary | ICD-10-CM | POA: Diagnosis not present

## 2023-08-14 DIAGNOSIS — G309 Alzheimer's disease, unspecified: Secondary | ICD-10-CM | POA: Diagnosis not present

## 2023-08-14 DIAGNOSIS — R278 Other lack of coordination: Secondary | ICD-10-CM | POA: Diagnosis not present

## 2023-08-14 DIAGNOSIS — M6281 Muscle weakness (generalized): Secondary | ICD-10-CM | POA: Diagnosis not present

## 2023-08-14 DIAGNOSIS — I639 Cerebral infarction, unspecified: Secondary | ICD-10-CM | POA: Diagnosis not present

## 2023-08-17 DIAGNOSIS — I639 Cerebral infarction, unspecified: Secondary | ICD-10-CM | POA: Diagnosis not present

## 2023-08-17 DIAGNOSIS — G309 Alzheimer's disease, unspecified: Secondary | ICD-10-CM | POA: Diagnosis not present

## 2023-08-17 DIAGNOSIS — R2689 Other abnormalities of gait and mobility: Secondary | ICD-10-CM | POA: Diagnosis not present

## 2023-08-17 DIAGNOSIS — R278 Other lack of coordination: Secondary | ICD-10-CM | POA: Diagnosis not present

## 2023-08-17 DIAGNOSIS — M6281 Muscle weakness (generalized): Secondary | ICD-10-CM | POA: Diagnosis not present

## 2023-08-18 DIAGNOSIS — G309 Alzheimer's disease, unspecified: Secondary | ICD-10-CM | POA: Diagnosis not present

## 2023-08-18 DIAGNOSIS — R278 Other lack of coordination: Secondary | ICD-10-CM | POA: Diagnosis not present

## 2023-08-18 DIAGNOSIS — M6281 Muscle weakness (generalized): Secondary | ICD-10-CM | POA: Diagnosis not present

## 2023-08-18 DIAGNOSIS — R2689 Other abnormalities of gait and mobility: Secondary | ICD-10-CM | POA: Diagnosis not present

## 2023-08-18 DIAGNOSIS — I639 Cerebral infarction, unspecified: Secondary | ICD-10-CM | POA: Diagnosis not present

## 2023-08-19 DIAGNOSIS — R278 Other lack of coordination: Secondary | ICD-10-CM | POA: Diagnosis not present

## 2023-08-19 DIAGNOSIS — R2689 Other abnormalities of gait and mobility: Secondary | ICD-10-CM | POA: Diagnosis not present

## 2023-08-19 DIAGNOSIS — M6281 Muscle weakness (generalized): Secondary | ICD-10-CM | POA: Diagnosis not present

## 2023-08-19 DIAGNOSIS — I639 Cerebral infarction, unspecified: Secondary | ICD-10-CM | POA: Diagnosis not present

## 2023-08-19 DIAGNOSIS — G309 Alzheimer's disease, unspecified: Secondary | ICD-10-CM | POA: Diagnosis not present

## 2023-08-20 DIAGNOSIS — R278 Other lack of coordination: Secondary | ICD-10-CM | POA: Diagnosis not present

## 2023-08-20 DIAGNOSIS — G309 Alzheimer's disease, unspecified: Secondary | ICD-10-CM | POA: Diagnosis not present

## 2023-08-20 DIAGNOSIS — R2689 Other abnormalities of gait and mobility: Secondary | ICD-10-CM | POA: Diagnosis not present

## 2023-08-20 DIAGNOSIS — I639 Cerebral infarction, unspecified: Secondary | ICD-10-CM | POA: Diagnosis not present

## 2023-08-20 DIAGNOSIS — M6281 Muscle weakness (generalized): Secondary | ICD-10-CM | POA: Diagnosis not present

## 2023-08-21 DIAGNOSIS — M6281 Muscle weakness (generalized): Secondary | ICD-10-CM | POA: Diagnosis not present

## 2023-08-21 DIAGNOSIS — G309 Alzheimer's disease, unspecified: Secondary | ICD-10-CM | POA: Diagnosis not present

## 2023-08-21 DIAGNOSIS — I639 Cerebral infarction, unspecified: Secondary | ICD-10-CM | POA: Diagnosis not present

## 2023-08-21 DIAGNOSIS — R2689 Other abnormalities of gait and mobility: Secondary | ICD-10-CM | POA: Diagnosis not present

## 2023-08-21 DIAGNOSIS — R278 Other lack of coordination: Secondary | ICD-10-CM | POA: Diagnosis not present

## 2023-08-24 DIAGNOSIS — G309 Alzheimer's disease, unspecified: Secondary | ICD-10-CM | POA: Diagnosis not present

## 2023-08-24 DIAGNOSIS — R2689 Other abnormalities of gait and mobility: Secondary | ICD-10-CM | POA: Diagnosis not present

## 2023-08-24 DIAGNOSIS — M6281 Muscle weakness (generalized): Secondary | ICD-10-CM | POA: Diagnosis not present

## 2023-08-24 DIAGNOSIS — I639 Cerebral infarction, unspecified: Secondary | ICD-10-CM | POA: Diagnosis not present

## 2023-08-24 DIAGNOSIS — R278 Other lack of coordination: Secondary | ICD-10-CM | POA: Diagnosis not present

## 2023-08-25 DIAGNOSIS — R278 Other lack of coordination: Secondary | ICD-10-CM | POA: Diagnosis not present

## 2023-08-25 DIAGNOSIS — I639 Cerebral infarction, unspecified: Secondary | ICD-10-CM | POA: Diagnosis not present

## 2023-08-25 DIAGNOSIS — M6281 Muscle weakness (generalized): Secondary | ICD-10-CM | POA: Diagnosis not present

## 2023-08-25 DIAGNOSIS — R2689 Other abnormalities of gait and mobility: Secondary | ICD-10-CM | POA: Diagnosis not present

## 2023-08-25 DIAGNOSIS — G309 Alzheimer's disease, unspecified: Secondary | ICD-10-CM | POA: Diagnosis not present

## 2023-08-26 DIAGNOSIS — G309 Alzheimer's disease, unspecified: Secondary | ICD-10-CM | POA: Diagnosis not present

## 2023-08-26 DIAGNOSIS — M6281 Muscle weakness (generalized): Secondary | ICD-10-CM | POA: Diagnosis not present

## 2023-08-26 DIAGNOSIS — R278 Other lack of coordination: Secondary | ICD-10-CM | POA: Diagnosis not present

## 2023-08-26 DIAGNOSIS — R2689 Other abnormalities of gait and mobility: Secondary | ICD-10-CM | POA: Diagnosis not present

## 2023-08-26 DIAGNOSIS — I639 Cerebral infarction, unspecified: Secondary | ICD-10-CM | POA: Diagnosis not present

## 2023-08-27 DIAGNOSIS — G309 Alzheimer's disease, unspecified: Secondary | ICD-10-CM | POA: Diagnosis not present

## 2023-08-27 DIAGNOSIS — I639 Cerebral infarction, unspecified: Secondary | ICD-10-CM | POA: Diagnosis not present

## 2023-08-27 DIAGNOSIS — M6281 Muscle weakness (generalized): Secondary | ICD-10-CM | POA: Diagnosis not present

## 2023-08-27 DIAGNOSIS — R278 Other lack of coordination: Secondary | ICD-10-CM | POA: Diagnosis not present

## 2023-08-27 DIAGNOSIS — R2689 Other abnormalities of gait and mobility: Secondary | ICD-10-CM | POA: Diagnosis not present

## 2023-08-28 DIAGNOSIS — R2689 Other abnormalities of gait and mobility: Secondary | ICD-10-CM | POA: Diagnosis not present

## 2023-08-28 DIAGNOSIS — M6281 Muscle weakness (generalized): Secondary | ICD-10-CM | POA: Diagnosis not present

## 2023-08-28 DIAGNOSIS — G309 Alzheimer's disease, unspecified: Secondary | ICD-10-CM | POA: Diagnosis not present

## 2023-08-28 DIAGNOSIS — R278 Other lack of coordination: Secondary | ICD-10-CM | POA: Diagnosis not present

## 2023-08-28 DIAGNOSIS — I639 Cerebral infarction, unspecified: Secondary | ICD-10-CM | POA: Diagnosis not present

## 2023-08-30 DIAGNOSIS — R2689 Other abnormalities of gait and mobility: Secondary | ICD-10-CM | POA: Diagnosis not present

## 2023-08-30 DIAGNOSIS — R278 Other lack of coordination: Secondary | ICD-10-CM | POA: Diagnosis not present

## 2023-08-30 DIAGNOSIS — G309 Alzheimer's disease, unspecified: Secondary | ICD-10-CM | POA: Diagnosis not present

## 2023-08-30 DIAGNOSIS — M6281 Muscle weakness (generalized): Secondary | ICD-10-CM | POA: Diagnosis not present

## 2023-08-30 DIAGNOSIS — I639 Cerebral infarction, unspecified: Secondary | ICD-10-CM | POA: Diagnosis not present

## 2023-08-31 DIAGNOSIS — I639 Cerebral infarction, unspecified: Secondary | ICD-10-CM | POA: Diagnosis not present

## 2023-08-31 DIAGNOSIS — M6281 Muscle weakness (generalized): Secondary | ICD-10-CM | POA: Diagnosis not present

## 2023-08-31 DIAGNOSIS — G309 Alzheimer's disease, unspecified: Secondary | ICD-10-CM | POA: Diagnosis not present

## 2023-08-31 DIAGNOSIS — R2689 Other abnormalities of gait and mobility: Secondary | ICD-10-CM | POA: Diagnosis not present

## 2023-08-31 DIAGNOSIS — R278 Other lack of coordination: Secondary | ICD-10-CM | POA: Diagnosis not present

## 2023-09-01 DIAGNOSIS — R2689 Other abnormalities of gait and mobility: Secondary | ICD-10-CM | POA: Diagnosis not present

## 2023-09-01 DIAGNOSIS — M6281 Muscle weakness (generalized): Secondary | ICD-10-CM | POA: Diagnosis not present

## 2023-09-01 DIAGNOSIS — G309 Alzheimer's disease, unspecified: Secondary | ICD-10-CM | POA: Diagnosis not present

## 2023-09-01 DIAGNOSIS — R278 Other lack of coordination: Secondary | ICD-10-CM | POA: Diagnosis not present

## 2023-09-01 DIAGNOSIS — I639 Cerebral infarction, unspecified: Secondary | ICD-10-CM | POA: Diagnosis not present

## 2023-09-02 DIAGNOSIS — I639 Cerebral infarction, unspecified: Secondary | ICD-10-CM | POA: Diagnosis not present

## 2023-09-02 DIAGNOSIS — R278 Other lack of coordination: Secondary | ICD-10-CM | POA: Diagnosis not present

## 2023-09-02 DIAGNOSIS — G309 Alzheimer's disease, unspecified: Secondary | ICD-10-CM | POA: Diagnosis not present

## 2023-09-02 DIAGNOSIS — M6281 Muscle weakness (generalized): Secondary | ICD-10-CM | POA: Diagnosis not present

## 2023-09-02 DIAGNOSIS — R2689 Other abnormalities of gait and mobility: Secondary | ICD-10-CM | POA: Diagnosis not present

## 2023-09-04 DIAGNOSIS — I639 Cerebral infarction, unspecified: Secondary | ICD-10-CM | POA: Diagnosis not present

## 2023-09-04 DIAGNOSIS — R278 Other lack of coordination: Secondary | ICD-10-CM | POA: Diagnosis not present

## 2023-09-04 DIAGNOSIS — M6281 Muscle weakness (generalized): Secondary | ICD-10-CM | POA: Diagnosis not present

## 2023-09-04 DIAGNOSIS — G309 Alzheimer's disease, unspecified: Secondary | ICD-10-CM | POA: Diagnosis not present

## 2023-09-04 DIAGNOSIS — R2689 Other abnormalities of gait and mobility: Secondary | ICD-10-CM | POA: Diagnosis not present

## 2023-09-05 DIAGNOSIS — M6281 Muscle weakness (generalized): Secondary | ICD-10-CM | POA: Diagnosis not present

## 2023-09-05 DIAGNOSIS — R278 Other lack of coordination: Secondary | ICD-10-CM | POA: Diagnosis not present

## 2023-09-05 DIAGNOSIS — G309 Alzheimer's disease, unspecified: Secondary | ICD-10-CM | POA: Diagnosis not present

## 2023-09-05 DIAGNOSIS — I639 Cerebral infarction, unspecified: Secondary | ICD-10-CM | POA: Diagnosis not present

## 2023-09-05 DIAGNOSIS — R2689 Other abnormalities of gait and mobility: Secondary | ICD-10-CM | POA: Diagnosis not present

## 2023-09-21 ENCOUNTER — Inpatient Hospital Stay (HOSPITAL_COMMUNITY)
Admission: EM | Admit: 2023-09-21 | Discharge: 2023-10-08 | DRG: 478 | Disposition: A | Discharge status: Skilled Nursing Facility | Payer: Medicare HMO | Source: Skilled Nursing Facility | Attending: Internal Medicine | Admitting: Internal Medicine

## 2023-09-21 ENCOUNTER — Emergency Department (HOSPITAL_COMMUNITY): Payer: Medicare HMO

## 2023-09-21 ENCOUNTER — Other Ambulatory Visit: Payer: Self-pay

## 2023-09-21 ENCOUNTER — Encounter (HOSPITAL_COMMUNITY): Payer: Self-pay

## 2023-09-21 DIAGNOSIS — N179 Acute kidney failure, unspecified: Secondary | ICD-10-CM | POA: Diagnosis not present

## 2023-09-21 DIAGNOSIS — Z8249 Family history of ischemic heart disease and other diseases of the circulatory system: Secondary | ICD-10-CM

## 2023-09-21 DIAGNOSIS — M8458XA Pathological fracture in neoplastic disease, other specified site, initial encounter for fracture: Principal | ICD-10-CM | POA: Diagnosis present

## 2023-09-21 DIAGNOSIS — R310 Gross hematuria: Secondary | ICD-10-CM | POA: Diagnosis not present

## 2023-09-21 DIAGNOSIS — G8220 Paraplegia, unspecified: Secondary | ICD-10-CM | POA: Diagnosis not present

## 2023-09-21 DIAGNOSIS — M4804 Spinal stenosis, thoracic region: Secondary | ICD-10-CM | POA: Diagnosis present

## 2023-09-21 DIAGNOSIS — E876 Hypokalemia: Secondary | ICD-10-CM | POA: Diagnosis present

## 2023-09-21 DIAGNOSIS — Z66 Do not resuscitate: Secondary | ICD-10-CM | POA: Diagnosis not present

## 2023-09-21 DIAGNOSIS — R066 Hiccough: Secondary | ICD-10-CM | POA: Diagnosis not present

## 2023-09-21 DIAGNOSIS — F17210 Nicotine dependence, cigarettes, uncomplicated: Secondary | ICD-10-CM | POA: Diagnosis present

## 2023-09-21 DIAGNOSIS — Z79899 Other long term (current) drug therapy: Secondary | ICD-10-CM

## 2023-09-21 DIAGNOSIS — N312 Flaccid neuropathic bladder, not elsewhere classified: Secondary | ICD-10-CM | POA: Diagnosis present

## 2023-09-21 DIAGNOSIS — Z8673 Personal history of transient ischemic attack (TIA), and cerebral infarction without residual deficits: Secondary | ICD-10-CM

## 2023-09-21 DIAGNOSIS — Z823 Family history of stroke: Secondary | ICD-10-CM

## 2023-09-21 DIAGNOSIS — L89322 Pressure ulcer of left buttock, stage 2: Secondary | ICD-10-CM | POA: Diagnosis not present

## 2023-09-21 DIAGNOSIS — E785 Hyperlipidemia, unspecified: Secondary | ICD-10-CM | POA: Diagnosis present

## 2023-09-21 DIAGNOSIS — Z833 Family history of diabetes mellitus: Secondary | ICD-10-CM

## 2023-09-21 DIAGNOSIS — I10 Essential (primary) hypertension: Secondary | ICD-10-CM | POA: Diagnosis present

## 2023-09-21 DIAGNOSIS — N390 Urinary tract infection, site not specified: Secondary | ICD-10-CM | POA: Diagnosis not present

## 2023-09-21 DIAGNOSIS — T380X5A Adverse effect of glucocorticoids and synthetic analogues, initial encounter: Secondary | ICD-10-CM | POA: Diagnosis present

## 2023-09-21 DIAGNOSIS — R200 Anesthesia of skin: Secondary | ICD-10-CM | POA: Diagnosis not present

## 2023-09-21 DIAGNOSIS — C7951 Secondary malignant neoplasm of bone: Secondary | ICD-10-CM | POA: Diagnosis present

## 2023-09-21 DIAGNOSIS — L89892 Pressure ulcer of other site, stage 2: Secondary | ICD-10-CM | POA: Diagnosis not present

## 2023-09-21 DIAGNOSIS — G992 Myelopathy in diseases classified elsewhere: Secondary | ICD-10-CM | POA: Diagnosis present

## 2023-09-21 DIAGNOSIS — L89312 Pressure ulcer of right buttock, stage 2: Secondary | ICD-10-CM | POA: Diagnosis not present

## 2023-09-21 DIAGNOSIS — F03A0 Unspecified dementia, mild, without behavioral disturbance, psychotic disturbance, mood disturbance, and anxiety: Secondary | ICD-10-CM | POA: Diagnosis present

## 2023-09-21 DIAGNOSIS — G8389 Other specified paralytic syndromes: Principal | ICD-10-CM

## 2023-09-21 DIAGNOSIS — Z7982 Long term (current) use of aspirin: Secondary | ICD-10-CM

## 2023-09-21 DIAGNOSIS — R262 Difficulty in walking, not elsewhere classified: Secondary | ICD-10-CM | POA: Diagnosis present

## 2023-09-21 LAB — CBC WITH DIFFERENTIAL/PLATELET
Abs Immature Granulocytes: 0.07 10*3/uL (ref 0.00–0.07)
Basophils Absolute: 0 10*3/uL (ref 0.0–0.1)
Basophils Relative: 0 %
Eosinophils Absolute: 0.1 10*3/uL (ref 0.0–0.5)
Eosinophils Relative: 0 %
HCT: 43 % (ref 39.0–52.0)
Hemoglobin: 15.1 g/dL (ref 13.0–17.0)
Immature Granulocytes: 1 %
Lymphocytes Relative: 11 %
Lymphs Abs: 1.4 10*3/uL (ref 0.7–4.0)
MCH: 29.8 pg (ref 26.0–34.0)
MCHC: 35.1 g/dL (ref 30.0–36.0)
MCV: 85 fL (ref 80.0–100.0)
Monocytes Absolute: 1 10*3/uL (ref 0.1–1.0)
Monocytes Relative: 8 %
Neutro Abs: 10.8 10*3/uL — ABNORMAL HIGH (ref 1.7–7.7)
Neutrophils Relative %: 80 %
Platelets: 209 10*3/uL (ref 150–400)
RBC: 5.06 MIL/uL (ref 4.22–5.81)
RDW: 14.1 % (ref 11.5–15.5)
WBC: 13.4 10*3/uL — ABNORMAL HIGH (ref 4.0–10.5)
nRBC: 0 % (ref 0.0–0.2)

## 2023-09-21 LAB — COMPREHENSIVE METABOLIC PANEL
ALT: 15 U/L (ref 0–44)
AST: 22 U/L (ref 15–41)
Albumin: 3.3 g/dL — ABNORMAL LOW (ref 3.5–5.0)
Alkaline Phosphatase: 76 U/L (ref 38–126)
Anion gap: 13 (ref 5–15)
BUN: 16 mg/dL (ref 8–23)
CO2: 27 mmol/L (ref 22–32)
Calcium: 9.3 mg/dL (ref 8.9–10.3)
Chloride: 96 mmol/L — ABNORMAL LOW (ref 98–111)
Creatinine, Ser: 1.06 mg/dL (ref 0.61–1.24)
GFR, Estimated: >60 mL/min (ref >60)
Glucose, Bld: 109 mg/dL — ABNORMAL HIGH (ref 70–99)
Potassium: 3.3 mmol/L — ABNORMAL LOW (ref 3.5–5.1)
Sodium: 136 mmol/L (ref 135–145)
Total Bilirubin: 1.1 mg/dL (ref <1.2)
Total Protein: 6.8 g/dL (ref 6.5–8.1)

## 2023-09-21 LAB — MAGNESIUM: Magnesium: 2 mg/dL (ref 1.7–2.4)

## 2023-09-21 MED ORDER — AMLODIPINE BESYLATE 10 MG PO TABS
10.0000 mg | ORAL_TABLET | Freq: Every day | ORAL | Status: DC
  Administered 2023-09-22 – 2023-10-01 (×9): 10 mg via ORAL
  Filled 2023-09-21: qty 2
  Filled 2023-09-21 (×5): qty 1
  Filled 2023-09-21: qty 2
  Filled 2023-09-21 (×2): qty 1

## 2023-09-21 MED ORDER — ACETAMINOPHEN 650 MG RE SUPP: 650.0000 mg | Freq: Four times a day (QID) | RECTAL | Status: DC | PRN

## 2023-09-21 MED ORDER — ALBUTEROL SULFATE (2.5 MG/3ML) 0.083% IN NEBU
2.5000 mg | INHALATION_SOLUTION | Freq: Four times a day (QID) | RESPIRATORY_TRACT | Status: DC | PRN
Start: 2023-09-21 — End: 2023-10-08

## 2023-09-21 MED ORDER — OXYCODONE HCL 5 MG PO TABS
5.0000 mg | ORAL_TABLET | ORAL | Status: DC | PRN
  Filled 2023-09-21: qty 1

## 2023-09-21 MED ORDER — POLYETHYLENE GLYCOL 3350 17 G PO PACK: 17.0000 g | PACK | Freq: Every day | ORAL | Status: DC | PRN

## 2023-09-21 MED ORDER — BISACODYL 5 MG PO TBEC: 5.0000 mg | DELAYED_RELEASE_TABLET | Freq: Every day | ORAL | Status: DC | PRN

## 2023-09-21 MED ORDER — ATORVASTATIN CALCIUM 40 MG PO TABS
40.0000 mg | ORAL_TABLET | Freq: Every day | ORAL | Status: DC
  Administered 2023-09-21 – 2023-10-07 (×17): 40 mg via ORAL
  Filled 2023-09-21 (×17): qty 1

## 2023-09-21 MED ORDER — GADOBUTROL 1 MMOL/ML IV SOLN
7.0000 mL | Freq: Once | INTRAVENOUS | Status: AC | PRN
  Administered 2023-09-21: 7 mL via INTRAVENOUS

## 2023-09-21 MED ORDER — HYDROCHLOROTHIAZIDE 12.5 MG PO TABS
12.5000 mg | ORAL_TABLET | Freq: Every day | ORAL | Status: DC
  Administered 2023-09-22 – 2023-10-01 (×9): 12.5 mg via ORAL
  Filled 2023-09-21 (×9): qty 1

## 2023-09-21 MED ORDER — ACETAMINOPHEN 325 MG PO TABS
650.0000 mg | ORAL_TABLET | Freq: Four times a day (QID) | ORAL | Status: DC | PRN
  Administered 2023-09-26 – 2023-09-28 (×2): 650 mg via ORAL
  Filled 2023-09-21 (×2): qty 2

## 2023-09-21 MED ORDER — POTASSIUM CHLORIDE CRYS ER 20 MEQ PO TBCR
40.0000 meq | EXTENDED_RELEASE_TABLET | Freq: Once | ORAL | Status: AC
Start: 2023-09-21 — End: 2023-09-21
  Administered 2023-09-21: 40 meq via ORAL
  Filled 2023-09-21: qty 2

## 2023-09-21 MED ORDER — HYDRALAZINE HCL 20 MG/ML IJ SOLN: 10.0000 mg | Freq: Four times a day (QID) | INTRAMUSCULAR | Status: DC | PRN

## 2023-09-21 MED ORDER — MORPHINE SULFATE (PF) 2 MG/ML IV SOLN: 1.0000 mg | INTRAVENOUS | Status: DC | PRN

## 2023-09-21 MED ORDER — ENOXAPARIN SODIUM 40 MG/0.4ML IJ SOSY
40.0000 mg | PREFILLED_SYRINGE | INTRAMUSCULAR | Status: DC
  Administered 2023-09-21 – 2023-10-07 (×17): 40 mg via SUBCUTANEOUS
  Filled 2023-09-21 (×17): qty 0.4

## 2023-09-21 NOTE — ED Provider Notes (Signed)
Care of patient received from prior provider at 4:32 PM, please see their note for complete H/P and care plan.  Received handoff per ED course.  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 No symptoms prior. At MRI now. No thinners F/U MRI [CC]  1815 Discussed with Dr. Danielle Dess, he recommended medical admission to look for primary, palliative care consultation. [CC]    Clinical Course User Index [CC] Glyn Ade, MD [SG] Sloan Leiter, DO   CRITICAL CARE Performed by: Glyn Ade   Total critical care time: 45 minutes  Critical care time was exclusive of separately billable procedures and treating other patients.  Critical care was necessary to treat or prevent imminent or life-threatening deterioration.  Critical care was time spent personally by me on the following activities: development of treatment plan with patient and/or surrogate as well as nursing, discussions with consultants, evaluation of patient's response to treatment, examination of patient, obtaining history from patient or surrogate, ordering and performing treatments and interventions, ordering and review of laboratory studies, ordering and review of radiographic studies, pulse oximetry and re-evaluation of patient's condition.  Reassessment: I received a critical call back about his MRI.  He has likely metastatic versus primary malignancy in his lower thoracic vertebrae with spinal cord effacement. States that as of yesterday he was walking and suddenly he lost ability to walk when he woke up this morning. Denies fevers chills nausea vomiting syncope shortness of breath or any other symptoms.  No history of similar.  Will consult neurosurgery based on the findings. Attempted to call his legal guardian due to his history of Alzheimer's.  Unfortunately there was no answer.  Neurosurgery Dr. Danielle Dess  reported that decompression is unlikely to be successful in this patient with his comorbidity and degree of paresis. Patient has very severe symptoms. He recommended looking for primary malignancy, possible IR sampling if medicine thinks is appropriate and palliative care consultation.  Again legal guardian was unable to be reached, will need social work involvement for determination on degree of intervention.  Disposition:   Based on the above findings, I believe this patient is stable for admission.    Patient/family educated about specific findings on our evaluation and explained exact reasons for admission.  Patient/family educated about clinical situation and time was allowed to answer questions.   Admission team communicated with and agreed with need for admission. Patient admitted. Patient  ready to move at this time.     Emergency Department Medication Summary:   Medications  potassium chloride SA (KLOR-CON M) CR tablet 40 mEq (has no administration in time range)  acetaminophen (TYLENOL) tablet 650 mg (has no administration in time range)    Or  acetaminophen (TYLENOL) suppository 650 mg (has no administration in time range)  polyethylene glycol (MIRALAX / GLYCOLAX) packet 17 g (has no administration in time range)  bisacodyl (DULCOLAX) EC tablet 5 mg (has no administration in time range)  albuterol (PROVENTIL) (2.5 MG/3ML) 0.083% nebulizer solution 2.5 mg (has no administration in time range)  hydrALAZINE (APRESOLINE) injection 10 mg (has no administration in time range)  enoxaparin (LOVENOX) injection 40 mg (has no administration in time range)  oxyCODONE (Oxy IR/ROXICODONE) immediate release tablet 5 mg (has no administration in time range)  morphine (PF) 2 MG/ML injection 1 mg (has no administration in time range)  amLODipine (NORVASC) tablet 10 mg (has no  administration in time range)  atorvastatin (LIPITOR) tablet 40 mg (has no administration in time range)   hydrochlorothiazide (HYDRODIURIL) tablet 12.5 mg (has no administration in time range)  gadobutrol (GADAVIST) 1 MMOL/ML injection 7 mL (7 mLs Intravenous Contrast Given 09/21/23 1637)          Glyn Ade, MD 09/21/23 1954

## 2023-09-21 NOTE — ED Notes (Signed)
Tap water enema given, with minor results

## 2023-09-21 NOTE — H&P (Signed)
[facility] History and Physical  Austin Weaver WUJ:[id number] DOB: [birth year] DOA: 09/21/2023 PCP: Mort Sawyers, FNP  [facility]ed from: [facility] Chief Complaint: Inability to walk  History of Present Illness: Austin Weaver is a 77 y.o. male with dementia, HTN, stroke who is a long-term nursing home patient at [facility]. At baseline, patient is able to ambulate.   Per report, yesterday he walked to the bathroom and while getting back, he felt that his legs were shaky, weak and were going to give out.  He got back to the bed and since then he was not able to move his legs.  His legs remained weak overnight and today he became completely nonambulatory and.  He also could not feel any sensation in the legs and hence was brought to the ED. Denies any incontinence.  In the ED, patient was afebrile, hemodynamically stable On exam in the ED, patient was noted to have a sensory level at the umbilical level and complete flaccid paralysis below that level He was noted to have urinary retention of more than 600 mL, Foley catheter was inserted. EDP noted diminished rectal tone.  MRI lumbar spine showed 1. Large contrast-enhancing lesion involving the T8 vertebral body worrisome for malignancy -lymphoma versus metastasis. There is epidural spread of tumor resulting in severe spinal canal stenosis with compression of the spinal cord at the T8 level. 2. Additional smaller contrast-enhancing lesions in the T10, T12, and S1 vertebral bodies, concerning for additional sites of metastatic disease.  EDP spoke with neurosurgery on-call Dr. Danielle Dess.  Per neurosurgery, decompression is unlikely to be successful in this patient with his degree of varices and other comorbidities.  Steroids unlikely to be helpful.  Patient did not have any pain. EDP tried to call patient's legal guardian but it was after 5 in internal response. [facility] service consulted for in-house management. Initial labs  with potassium low at 3.3, WBC count at 13.4 Potassium replacement ordered  At the time of my evaluation, patient was lying down in bed. Not in distress. Alert, awake, oriented to place only. Not in pain.  I told him about the MRI finding. No emotion. No question was asked.  Legal guardian not reachable.  Review of Systems:  All systems were reviewed and were negative unless otherwise mentioned in the HPI   Past medical history: Past Medical History:  Diagnosis Date   Allergy    Fall 09/21/2018   History of transient ischemic attack (TIA) 11/20/2017   History of transient ischemic attack (TIA) 11/20/2017   Hypertension    Old Cerebral infarction (HCC) 09/21/2018   Remote L. thalamic and R. external capsul & corona radiata infarcts   Stroke [facility])     Past surgical history: Past Surgical History:  Procedure Laterality Date   laporotomy      Social History:  reports that he has been smoking cigarettes. He has a 25 pack-year smoking history. He has never used smokeless tobacco. He reports that he does not drink alcohol and does not use drugs.  Allergies:  No Known Allergies Patient has no known allergies.   Family history:  Family History  Problem Relation Age of Onset   Hypertension Mother    Diabetes Mother    Stroke Mother    Stroke Father    Stroke Maternal Grandmother    Stroke Maternal Grandfather    Cancer Maternal Grandfather        unknown type   Stroke Paternal Grandmother    Stroke Paternal Grandfather  Kidney failure Son      Physical Exam: Vitals:   09/21/23 1725 09/21/23 1730 09/21/23 1745 09/21/23 1915  BP: 124/66 118/64 110/79 107/76  Pulse: 82 (!) 128 78 73  Resp: 16 15 12 17   Temp: 98.5 F (36.9 C)     TempSrc: Oral     SpO2: 100% 92% 100% 100%   Wt Readings from Last 3 Encounters:  05/18/23 72.8 kg  02/20/23 73.7 kg  07/11/22 78.2 kg   There is no height or weight on file to calculate BMI.  General exam: pleasant, elderly AA  male, demented. Not in distress Skin: No rashes, lesions or ulcers. HEENT: Atraumatic, normocephalic, no obvious bleeding Lungs: CTAB CVS: S1, S2, M0 GI/Abd soft, nontender, nondistended, BS+ CNS: Alert, awake, oriented to place only. Flaccid paralysis of both lower extremities noted.  Psychiatry: sad affect Extremities: no pedal edema, no calf tenderness   ----------------------------------------------------------------------------------------------------------------------------------------- ----------------------------------------------------------------------------------------------------------------------------------------- -----------------------------------------------------------------------------------------------------------------------------------------  Assessment/Plan: Principal Problem:   Acute paraplegia (HCC)  Acute paraplegia Presented with sudden onset bilateral lower extremity weakness starting yesterday Noted to have flaccid paraplegia, sensory level, urine retention MRI with large T8 vertebral body malignancy with epidural spread causing severe spinal canal stenosis and compression of the spinal cord Per neurosurgery, decompression unlikely to be successful given his age and comorbidities.  Steroids unlikely to be helpful No pain symptoms Foley catheter placed for neurogenic bladder Legal guardian not reachable at this time I have requested palliative care consult  Hypokalemia Potassium level low at 3.3.  Replacement ordered. Recent Labs  Lab 09/21/23 1132  K 3.3*  MG 2.0   Hypertension PTA meds- .amlodipine 10 mg daily, HCTZ 12.5 mg daily Continue both  Hyperlipidemia Lipitor 40 mg daily I would keep aspirin on hold just in case patient ends up getting biopsy.  Goals of care:   Code Status: Full Code    DVT prophylaxis:  enoxaparin (LOVENOX) injection 40 mg Start: 09/21/23 1900   Antimicrobials: none Fluid: none Consultants: EDP spoke with  neurosurgeon Dr. Danielle Dess Family Communication: legal guardian not reachable at this time.  Dispo: The patient is from: longterm NH              Anticipated d/c is to: pending clinical course  Diet: Diet Order             Diet regular Room service appropriate? Yes; Fluid consistency: Thin  Diet effective now                    ------------------------------------------------------------------------------------- Severity of Illness: The appropriate patient status for this patient is OBSERVATION. Observation status is judged to be reasonable and necessary in order to provide the required intensity of service to ensure the patient's safety. The patient's presenting symptoms, physical exam findings, and initial radiographic and laboratory data in the context of their medical condition is felt to place them at decreased risk for further clinical deterioration. Furthermore, it is anticipated that the patient will be medically stable for discharge from the hospital within 2 midnights of admission.  -------------------------------------------------------------------------------------  Home Meds: Prior to Admission medications   Medication Sig Start Date End Date Taking? Authorizing Provider  acetaminophen (TYLENOL) 325 MG tablet Take 650 mg by mouth every 6 (six) hours as needed for mild pain (pain score 1-3) or moderate pain (pain score 4-6).   Yes [provider]  amLODipine (NORVASC) 10 MG tablet Take 1 tablet (10 mg total) by mouth daily. 02/20/23  Yes Mort Sawyers, FNP  aspirin EC 81  MG tablet Take 81 mg by mouth daily. Swallow whole.   Yes [provider]  atorvastatin (LIPITOR) 40 MG tablet Take 1 tablet (40 mg total) by mouth daily. 02/20/23 02/21/24 Yes Dugal, Wyatt Mage, FNP  hydrochlorothiazide (HYDRODIURIL) 12.5 MG tablet Take 12.5 mg by mouth daily. 03/30/23  Yes [provider]  Vitamin D, Ergocalciferol, (DRISDOL) 1.25 MG (50000 UNIT) CAPS capsule Take  50,000 Units by mouth every 7 (seven) days. Every Tuesday   Yes [provider]  fluticasone (FLONASE) 50 MCG/ACT nasal spray Place 2 sprays into both nostrils daily for 6 days. 05/21/23 05/27/23  Tyrone Nine, MD    Labs on Admission:   CBC: Recent Labs  Lab 09/21/23 1132  WBC 13.4*  NEUTROABS 10.8*  HGB 15.1  HCT 43.0  MCV 85.0  PLT 209    Basic Metabolic Panel: Recent Labs  Lab 09/21/23 1132  NA 136  K 3.3*  CL 96*  CO2 27  GLUCOSE 109*  BUN 16  CREATININE 1.06  CALCIUM 9.3  MG 2.0    Liver Function Tests: Recent Labs  Lab 09/21/23 1132  AST 22  ALT 15  ALKPHOS 76  BILITOT 1.1  PROT 6.8  ALBUMIN 3.3*   No results for input(s): "LIPASE", "AMYLASE" in the last 168 hours. No results for input(s): "AMMONIA" in the last 168 hours.  Cardiac Enzymes: No results for input(s): "CKTOTAL", "CKMB", "CKMBINDEX", "TROPONINI" in the last 168 hours.  BNP (last 3 results) No results for input(s): "BNP" in the last 8760 hours.  ProBNP (last 3 results) No results for input(s): "PROBNP" in the last 8760 hours.  CBG: No results for input(s): "GLUCAP" in the last 168 hours.  Lipase  No results found for: "LIPASE"   Urinalysis    Component Value Date/Time   COLORURINE YELLOW 05/17/2023 2202   APPEARANCEUR HAZY (A) 05/17/2023 2202   LABSPEC 1.014 05/17/2023 2202   PHURINE 7.0 05/17/2023 2202   GLUCOSEU NEGATIVE 05/17/2023 2202   HGBUR MODERATE (A) 05/17/2023 2202   BILIRUBINUR NEGATIVE 05/17/2023 2202   KETONESUR 5 (A) 05/17/2023 2202   PROTEINUR NEGATIVE 05/17/2023 2202   NITRITE NEGATIVE 05/17/2023 2202   LEUKOCYTESUR NEGATIVE 05/17/2023 2202     Drugs of Abuse     Component Value Date/Time   LABOPIA NONE DETECTED 05/17/2023 2202   COCAINSCRNUR NONE DETECTED 05/17/2023 2202   LABBENZ NONE DETECTED 05/17/2023 2202   AMPHETMU NONE DETECTED 05/17/2023 2202   THCU NONE DETECTED 05/17/2023 2202   LABBARB NONE DETECTED 05/17/2023 2202       Radiological Exams on Admission: MR THORACIC SPINE W WO CONTRAST Result Date: 09/21/2023 CLINICAL DATA:  Mid-back pain, neuro deficit Trunk numbness or tingling no sensation below t10; Myelopathy, acute, lumbar spine no sensation below t10 EXAM: MRI THORACIC AND LUMBAR SPINE WITHOUT AND WITH CONTRAST TECHNIQUE: Multiplanar and multiecho pulse sequences of the thoracic and lumbar spine were obtained without and with intravenous contrast. CONTRAST:  7mL GADAVIST GADOBUTROL 1 MMOL/ML IV SOLN COMPARISON:  None Available. FINDINGS: MRI THORACIC SPINE FINDINGS Alignment:  Physiologic. Vertebrae: There is a large contrast-enhancing lesion involving the anterior and posterior aspect of the T8 vertebral body as well as the posterior elements. There is epidural spread of tumor resulting in severe spinal canal stenosis of the T8 level with compression of the spinal cord. There is an additional separate contrast-enhancing lesion in the T10 vertebral body (series 20, image 10) in the T12 vertebral body (series 18, image 6). Cord: No cord signal  abnormality. There is severe spinal canal stenosis with mass effect on the spinal cord at the T8 level (series 8, image 7) Paraspinal and other soft tissues: Negative. Disc levels: Severe spinal canal stenosis with flattening of the spinal cord at the T8 level. There is epidural spread of tumor in the ventral and dorsal epidural space at the T8 level, extending superiorly to the T6 level (series 20, image 14) and inferiorly to involve the T9 level (series 20, image 12). MRI LUMBAR SPINE FINDINGS Segmentation:  Standard. Alignment:  Physiologic. Vertebrae: No fracture, evidence of discitis. There is a small contrast-enhancing lesion in the S1 vertebral body (series 18, image 8). There are chronic superior endplate compression deformities at L1, L2, and L3. Conus medullaris: Extends to the L1-L2 disc space level and appears normal. Paraspinal and other soft tissues: Bilateral T2  hyperintense renal lesions are favored to represent simple renal cysts requiring no further imaging workup Disc levels: T12-L1: Mild bilateral facet degenerative change. No spinal canal or neural foraminal narrowing. L1-L2: Mild bilateral facet degenerative change. Minimal disc bulge. Mild spinal canal narrowing. No neural foraminal narrowing. L2-L3: Mild bilateral facet degenerative change. Small disc bulge. Mild spinal canal narrowing. Mild bilateral neural foraminal narrowing. L3-L4: Moderate bilateral facet degenerative change. Circumferential disc bulge. Mild-to-moderate spinal canal narrowing. Mild-to-moderate bilateral neural foraminal narrowing. L4-L5: Mild bilateral facet degenerative change. Circumferential disc bulge there is narrowing of the right lateral recess. Moderate right and mild left neural foraminal narrowing. Mild overall spinal canal narrowing. L5-S1: Severe disc space loss. Mild bilateral facet degenerative change. No spinal canal narrowing. Mild-to-moderate bilateral neural foraminal narrowing. IMPRESSION: 1. Large contrast-enhancing lesion involving the T8 vertebral body is worrisome for malignancy. Differential considerations include lymphoma or a metastatic lesion. There is epidural spread of tumor resulting in severe spinal canal stenosis with compression of the spinal cord at the T8 level. 2. Additional smaller contrast-enhancing lesions in the T10, T12, and S1 vertebral bodies, concerning for additional sites of metastatic disease. Findings were discussed with Dr. Doran Durand on 09/21/23 at 5:33 PM. Electronically Signed   By: Lorenza Cambridge M.D.   On: 09/21/2023 17:37   MR Lumbar Spine W Wo Contrast Result Date: 09/21/2023 CLINICAL DATA:  Mid-back pain, neuro deficit Trunk numbness or tingling no sensation below t10; Myelopathy, acute, lumbar spine no sensation below t10 EXAM: MRI THORACIC AND LUMBAR SPINE WITHOUT AND WITH CONTRAST TECHNIQUE: Multiplanar and multiecho pulse  sequences of the thoracic and lumbar spine were obtained without and with intravenous contrast. CONTRAST:  7mL GADAVIST GADOBUTROL 1 MMOL/ML IV SOLN COMPARISON:  None Available. FINDINGS: MRI THORACIC SPINE FINDINGS Alignment:  Physiologic. Vertebrae: There is a large contrast-enhancing lesion involving the anterior and posterior aspect of the T8 vertebral body as well as the posterior elements. There is epidural spread of tumor resulting in severe spinal canal stenosis of the T8 level with compression of the spinal cord. There is an additional separate contrast-enhancing lesion in the T10 vertebral body (series 20, image 10) in the T12 vertebral body (series 18, image 6). Cord: No cord signal abnormality. There is severe spinal canal stenosis with mass effect on the spinal cord at the T8 level (series 8, image 7) Paraspinal and other soft tissues: Negative. Disc levels: Severe spinal canal stenosis with flattening of the spinal cord at the T8 level. There is epidural spread of tumor in the ventral and dorsal epidural space at the T8 level, extending superiorly to the T6 level (series 20, image 14) and inferiorly to  involve the T9 level (series 20, image 12). MRI LUMBAR SPINE FINDINGS Segmentation:  Standard. Alignment:  Physiologic. Vertebrae: No fracture, evidence of discitis. There is a small contrast-enhancing lesion in the S1 vertebral body (series 18, image 8). There are chronic superior endplate compression deformities at L1, L2, and L3. Conus medullaris: Extends to the L1-L2 disc space level and appears normal. Paraspinal and other soft tissues: Bilateral T2 hyperintense renal lesions are favored to represent simple renal cysts requiring no further imaging workup Disc levels: T12-L1: Mild bilateral facet degenerative change. No spinal canal or neural foraminal narrowing. L1-L2: Mild bilateral facet degenerative change. Minimal disc bulge. Mild spinal canal narrowing. No neural foraminal narrowing. L2-L3:  Mild bilateral facet degenerative change. Small disc bulge. Mild spinal canal narrowing. Mild bilateral neural foraminal narrowing. L3-L4: Moderate bilateral facet degenerative change. Circumferential disc bulge. Mild-to-moderate spinal canal narrowing. Mild-to-moderate bilateral neural foraminal narrowing. L4-L5: Mild bilateral facet degenerative change. Circumferential disc bulge there is narrowing of the right lateral recess. Moderate right and mild left neural foraminal narrowing. Mild overall spinal canal narrowing. L5-S1: Severe disc space loss. Mild bilateral facet degenerative change. No spinal canal narrowing. Mild-to-moderate bilateral neural foraminal narrowing. IMPRESSION: 1. Large contrast-enhancing lesion involving the T8 vertebral body is worrisome for malignancy. Differential considerations include lymphoma or a metastatic lesion. There is epidural spread of tumor resulting in severe spinal canal stenosis with compression of the spinal cord at the T8 level. 2. Additional smaller contrast-enhancing lesions in the T10, T12, and S1 vertebral bodies, concerning for additional sites of metastatic disease. Findings were discussed with Dr. Doran Durand on 09/21/23 at 5:33 PM. Electronically Signed   By: Lorenza Cambridge M.D.   On: 09/21/2023 17:37     Signed, Lorin Glass, MD [facility] 09/21/2023

## 2023-09-21 NOTE — ED Provider Notes (Signed)
[place] EMERGENCY DEPARTMENT AT [facility] Provider Note  CSN: [id number] Arrival date & time: 09/21/23 1114  Chief Complaint(s) Numbness  HPI Venkat Brokaw is a 77 y.o. male with past medical history as below, significant for TIA, CVA, SNF, HTN, HLD who presents to the ED with complaint of leg weakness.  Patient reports yesterday around 1 PM he was walking from his bathroom back to his bed, reports that he felt shaky, felt his legs were going to give out, he got back into bed and since then he has been unable to move his legs, denies any sensation to his legs.  He has some vague pain to his low back that he attributes to uncomfortable mattress.  He denies any incontinence or overflow of urine or bowel.  No fevers or chills last few days, no vomiting or nausea.  No rashes.  No falls.  Denies similar complaints in the past.  He uses a wheelchair at times but reports that he typically can walk around his room.  Past Medical History Past Medical History:  Diagnosis Date   Allergy    Fall 09/21/2018   History of transient ischemic attack (TIA) 11/20/2017   History of transient ischemic attack (TIA) 11/20/2017   Hypertension    Old Cerebral infarction (HCC) 09/21/2018   Remote L. thalamic and R. external capsul & corona radiata infarcts   Stroke [facility])    Patient Active Problem List   Diagnosis Date Noted   Sepsis (HCC) 05/17/2023   Unable to care for self 02/20/2023   Vision changes 02/20/2023   Screening for tuberculosis 02/20/2023   Left carotid bruit 02/20/2023   Decreased pulses in feet 02/20/2023   Pedal edema 02/20/2023   Microalbuminuria 02/20/2023   Onychomycosis 05/28/2022   Nicotine dependence, cigarettes, uncomplicated 10/06/2021   Left-sided weakness 05/01/2021   Lack of access to transportation 05/01/2021   Mixed hyperlipidemia 01/11/2020   Allergies 12/01/2018   Essential hypertension 12/01/2018   Food insecurity 12/01/2018   Alzheimer's dementia  with behavioral disturbance (HCC)    Acute metabolic encephalopathy    Vitamin B12 deficiency 09/22/2018   History of stroke 09/22/2018   Old Cerebral infarction (HCC) 09/21/2018   Home Medication(s) Prior to Admission medications   Medication Sig Start Date End Date Taking? Authorizing Provider  acetaminophen (TYLENOL) 325 MG tablet Take 650 mg by mouth every 6 (six) hours as needed for mild pain (pain score 1-3) or moderate pain (pain score 4-6).   Yes [provider]  amLODipine (NORVASC) 10 MG tablet Take 1 tablet (10 mg total) by mouth daily. 02/20/23  Yes Mort Sawyers, FNP  aspirin EC 81 MG tablet Take 81 mg by mouth daily. Swallow whole.   Yes [provider]  atorvastatin (LIPITOR) 40 MG tablet Take 1 tablet (40 mg total) by mouth daily. 02/20/23 02/21/24 Yes Dugal, Wyatt Mage, FNP  hydrochlorothiazide (HYDRODIURIL) 12.5 MG tablet Take 12.5 mg by mouth daily. 03/30/23  Yes [provider]  Vitamin D, Ergocalciferol, (DRISDOL) 1.25 MG (50000 UNIT) CAPS capsule Take 50,000 Units by mouth every 7 (seven) days. Every Tuesday   Yes [provider]  fluticasone (FLONASE) 50 MCG/ACT nasal spray Place 2 sprays into both nostrils daily for 6 days. 05/21/23 05/27/23  Tyrone Nine, MD  Past Surgical History Past Surgical History:  Procedure Laterality Date   laporotomy     Family History Family History  Problem Relation Age of Onset   Hypertension Mother    Diabetes Mother    Stroke Mother    Stroke Father    Stroke Maternal Grandmother    Stroke Maternal Grandfather    Cancer Maternal Grandfather        unknown type   Stroke Paternal Grandmother    Stroke Paternal Grandfather    Kidney failure Son     Social History Social History   Tobacco Use   Smoking status: Every Day    Current packs/day: 0.50    Average  packs/day: 0.5 packs/day for 50.0 years (25.0 ttl pk-yrs)    Types: Cigarettes   Smokeless tobacco: Never  Vaping Use   Vaping status: Never Used  Substance Use Topics   Alcohol use: No   Drug use: Never   Allergies Patient has no known allergies.  Review of Systems Review of Systems  Constitutional:  Negative for chills and fever.  Respiratory:  Negative for chest tightness and shortness of breath.   Cardiovascular:  Negative for chest pain.  Gastrointestinal:  Negative for abdominal pain, nausea and vomiting.  Skin:  Negative for rash and wound.  Neurological:  Positive for weakness and numbness. Negative for light-headedness.  All other systems reviewed and are negative.   Physical Exam Vital Signs  I have reviewed the triage vital signs BP 124/66 (BP Location: Right Arm)   Pulse 82   Temp 98.5 F (36.9 C) (Oral)   Resp 16   SpO2 100%  Physical Exam Vitals and nursing note reviewed. Exam conducted with a chaperone present (RN PULLIAM).  Constitutional:      General: He is not in acute distress.    Appearance: He is well-developed.  HENT:     Head: Normocephalic and atraumatic. No right periorbital erythema or left periorbital erythema.     Right Ear: External ear normal.     Left Ear: External ear normal.     Mouth/Throat:     Mouth: Mucous membranes are moist.  Eyes:     General: No scleral icterus.    Extraocular Movements: Extraocular movements intact.     Pupils: Pupils are equal, round, and reactive to light.  Cardiovascular:     Rate and Rhythm: Normal rate and regular rhythm.     Pulses: Normal pulses.     Heart sounds: Normal heart sounds.  Pulmonary:     Effort: Pulmonary effort is normal. No respiratory distress.     Breath sounds: Normal breath sounds.  Abdominal:     General: Abdomen is flat.     Palpations: Abdomen is soft.     Tenderness: There is no abdominal tenderness. There is no guarding or rebound.  Genitourinary:    Comments:  Diminished rectal tone Brown stool in rectal vault Musculoskeletal:     Cervical back: No rigidity.     Right lower leg: No edema.     Left lower leg: No edema.  Skin:    General: Skin is warm and dry.     Capillary Refill: Capillary refill takes less than 2 seconds.  Neurological:     Mental Status: He is alert and oriented to person, place, and time.     GCS: GCS eye subscore is 4. GCS verbal subscore is 5. GCS motor subscore is 6.     Cranial Nerves: No facial asymmetry.  Sensory: Sensory deficit present.     Motor: Weakness present.     Coordination: Coordination is intact.     Comments: No sensation below umbilicus/T10 No response to noxious stimulus below T10 Rectal tone is diminished Strength 0 out of 5 BL/LE   Occasional speech abnormality at baseline / stutter   Psychiatric:        Mood and Affect: Mood normal.        Behavior: Behavior normal.     ED Results and Treatments Labs (all labs ordered are listed, but only abnormal results are displayed) Labs Reviewed  CBC WITH DIFFERENTIAL/PLATELET - Abnormal; Notable for the following components:      Result Value   WBC 13.4 (*)    Neutro Abs 10.8 (*)    All other components within normal limits  COMPREHENSIVE METABOLIC PANEL - Abnormal; Notable for the following components:   Potassium 3.3 (*)    Chloride 96 (*)    Glucose, Bld 109 (*)    Albumin 3.3 (*)    All other components within normal limits  MAGNESIUM  URINALYSIS, ROUTINE W REFLEX MICROSCOPIC                                                                                                                          Radiology No results found.  Pertinent labs & imaging results that were available during my care of the patient were reviewed by me and considered in my medical decision making (see MDM for details).  Medications Ordered in ED Medications  gadobutrol (GADAVIST) 1 MMOL/ML injection 7 mL (7 mLs Intravenous Contrast Given 09/21/23 1637)                                                                                                                                      Procedures .Critical Care  Performed by: Sloan Leiter, DO Authorized by: Sloan Leiter, DO   Critical care provider statement:    Critical care time (minutes):  31   Critical care time was exclusive of:  Separately billable procedures and treating other patients   Critical care was necessary to treat or prevent imminent or life-threatening deterioration of the following conditions:  CNS failure or compromise   Critical care was time spent personally by me on the following activities:  Development of treatment plan with patient or surrogate, evaluation of patient's response to treatment,  examination of patient, ordering and review of laboratory studies, ordering and review of radiographic studies, ordering and performing treatments and interventions, pulse oximetry, re-evaluation of patient's condition, review of old charts and obtaining history from patient or surrogate   (including critical care time)  Medical Decision Making / ED Course    Medical Decision Making:    Zakkai Zappa is a 77 y.o. male with past medical history as below, significant for TIA, CVA, SNF, HTN, HLD who presents to the ED with complaint of leg weakness.  . The complaint involves an extensive differential diagnosis and also carries with it a high risk of complications and morbidity.  Serious etiology was considered. Ddx includes but is not limited to: Differential diagnosis includes but is not exclusive to musculoskeletal back pain, renal colic, urinary tract infection, pyelonephritis, intra-abdominal causes of back pain, aortic aneurysm or dissection, cauda equina syndrome, sciatica, lumbar disc disease, thoracic disc disease, spinal cord abnormality, myelopathy, cauda equina, infectious etiology etc.   Complete initial physical exam performed, notably the patient was in no acute  distress, no sensation below T10.    Reviewed and confirmed nursing documentation for past medical history, family history, social history.  Vital signs reviewed.    Patient with b/l lower extremity weakness, numbness below T10, will get screening labs, get MRI T and L-spine.  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 No symptoms prior. At MRI now. No thinners F/U MRI [CC]    Clinical Course User Index [CC] Glyn Ade, MD [SG] Sloan Leiter, DO    Brief summary: 77 year old male history as above here from SNF with LE paralysis, anesthesia.  He is a sensate below the umbilicus, severely diminished rectal tone, retaining urine.  He has some mid/low back pain on exam as well midline.  Per patient he was ambulatory yesterday at his baseline around 1 PM.  Labs are stable.  MRI is pending, on wet read concerning for significant abnormality in the T-spine into spinal cord. Handoff to incoming EDP pending imaging and likely admission/nsgy consult          Additional history obtained: -Additional history obtained from na -External records from outside source obtained and reviewed including: Chart review including previous notes, labs, imaging, consultation notes including  Recent admission, primary care documentation, home medications, prior labs and imaging   Lab Tests: -I ordered, reviewed, and interpreted labs.   The pertinent results include:   Labs Reviewed  CBC WITH DIFFERENTIAL/PLATELET - Abnormal; Notable for the following components:      Result Value   WBC 13.4 (*)    Neutro Abs 10.8 (*)    All other components within normal limits  COMPREHENSIVE METABOLIC PANEL - Abnormal; Notable for the following components:   Potassium 3.3 (*)    Chloride 96 (*)    Glucose, Bld 109 (*)    Albumin 3.3 (*)    All other components within normal limits  MAGNESIUM   URINALYSIS, ROUTINE W REFLEX MICROSCOPIC    Notable for stable  EKG   EKG Interpretation Date/Time:    Ventricular Rate:    PR Interval:    QRS Duration:    QT Interval:    QTC Calculation:   R Axis:      Text Interpretation:           Imaging Studies ordered: I ordered imaging studies including  MRI T/L I independently visualized the following imaging with scope of interpretation limited to determining acute life threatening conditions related to emergency care; findings noted above I independently visualized and interpreted imaging. I agree with the radiologist interpretation   Medicines ordered and prescription drug management: Meds ordered this encounter  Medications   gadobutrol (GADAVIST) 1 MMOL/ML injection 7 mL    -I have reviewed the patients home medicines and have made adjustments as needed   Consultations Obtained: na   Cardiac Monitoring: Continuous pulse oximetry interpreted by myself, 99% on RA.    Social Determinants of Health:  Diagnosis or treatment significantly limited by social determinants of health: current smoker, SNF   Reevaluation: After the interventions noted above, I reevaluated the patient and found that they have stayed the same  Co morbidities that complicate the patient evaluation  Past Medical History:  Diagnosis Date   Allergy    Fall 09/21/2018   History of transient ischemic attack (TIA) 11/20/2017   History of transient ischemic attack (TIA) 11/20/2017   Hypertension    Old Cerebral infarction (HCC) 09/21/2018   Remote L. thalamic and R. external capsul & corona radiata infarcts   Stroke [facility])       Dispostion: Disposition decision including need for hospitalization was considered, and patient disposition pending at time of sign out.    Final Clinical Impression(s) / ED Diagnoses Final diagnoses:  Acute flaccid paralysis (HCC)        Sloan Leiter, DO 09/21/23 1737

## 2023-09-21 NOTE — ED Notes (Signed)
PT endorses total loss of sensation beginning roughly 1 inch within the umbilicus and extending to both feet bilaterally, unable to ambulate either lower extremity

## 2023-09-21 NOTE — ED Notes (Signed)
Patient transported to MRI 

## 2023-09-21 NOTE — ED Triage Notes (Signed)
Patient BIB [facility] from [place] for decreased use of his legs. Patient typically able to ambulate and got to bathroom yesterday but could not get back. Today patient nonambulatory and no sensation in his legs, bilaterally. Patient has equal strength in both arms, no unilateral deficits.

## 2023-09-22 DIAGNOSIS — R200 Anesthesia of skin: Secondary | ICD-10-CM | POA: Diagnosis present

## 2023-09-22 DIAGNOSIS — Z515 Encounter for palliative care: Secondary | ICD-10-CM | POA: Diagnosis not present

## 2023-09-22 DIAGNOSIS — N312 Flaccid neuropathic bladder, not elsewhere classified: Secondary | ICD-10-CM | POA: Diagnosis present

## 2023-09-22 DIAGNOSIS — R262 Difficulty in walking, not elsewhere classified: Secondary | ICD-10-CM | POA: Diagnosis present

## 2023-09-22 DIAGNOSIS — G8389 Other specified paralytic syndromes: Secondary | ICD-10-CM | POA: Diagnosis not present

## 2023-09-22 DIAGNOSIS — C7951 Secondary malignant neoplasm of bone: Secondary | ICD-10-CM | POA: Diagnosis present

## 2023-09-22 DIAGNOSIS — G992 Myelopathy in diseases classified elsewhere: Secondary | ICD-10-CM | POA: Diagnosis present

## 2023-09-22 DIAGNOSIS — Z7189 Other specified counseling: Secondary | ICD-10-CM

## 2023-09-22 DIAGNOSIS — F0390 Unspecified dementia without behavioral disturbance: Secondary | ICD-10-CM | POA: Diagnosis not present

## 2023-09-22 DIAGNOSIS — M8458XA Pathological fracture in neoplastic disease, other specified site, initial encounter for fracture: Secondary | ICD-10-CM | POA: Diagnosis present

## 2023-09-22 DIAGNOSIS — N179 Acute kidney failure, unspecified: Secondary | ICD-10-CM | POA: Diagnosis not present

## 2023-09-22 DIAGNOSIS — E785 Hyperlipidemia, unspecified: Secondary | ICD-10-CM | POA: Diagnosis present

## 2023-09-22 DIAGNOSIS — F03A0 Unspecified dementia, mild, without behavioral disturbance, psychotic disturbance, mood disturbance, and anxiety: Secondary | ICD-10-CM | POA: Diagnosis present

## 2023-09-22 DIAGNOSIS — N390 Urinary tract infection, site not specified: Secondary | ICD-10-CM | POA: Diagnosis not present

## 2023-09-22 DIAGNOSIS — F03B4 Unspecified dementia, moderate, with anxiety: Secondary | ICD-10-CM | POA: Diagnosis not present

## 2023-09-22 DIAGNOSIS — Z79899 Other long term (current) drug therapy: Secondary | ICD-10-CM | POA: Diagnosis not present

## 2023-09-22 DIAGNOSIS — M4804 Spinal stenosis, thoracic region: Secondary | ICD-10-CM | POA: Diagnosis present

## 2023-09-22 DIAGNOSIS — L89322 Pressure ulcer of left buttock, stage 2: Secondary | ICD-10-CM | POA: Diagnosis not present

## 2023-09-22 DIAGNOSIS — R066 Hiccough: Secondary | ICD-10-CM | POA: Diagnosis not present

## 2023-09-22 DIAGNOSIS — T380X5A Adverse effect of glucocorticoids and synthetic analogues, initial encounter: Secondary | ICD-10-CM | POA: Diagnosis present

## 2023-09-22 DIAGNOSIS — G8220 Paraplegia, unspecified: Secondary | ICD-10-CM | POA: Diagnosis present

## 2023-09-22 DIAGNOSIS — I10 Essential (primary) hypertension: Secondary | ICD-10-CM | POA: Diagnosis present

## 2023-09-22 DIAGNOSIS — Z8673 Personal history of transient ischemic attack (TIA), and cerebral infarction without residual deficits: Secondary | ICD-10-CM | POA: Diagnosis not present

## 2023-09-22 DIAGNOSIS — L89312 Pressure ulcer of right buttock, stage 2: Secondary | ICD-10-CM | POA: Diagnosis not present

## 2023-09-22 DIAGNOSIS — E876 Hypokalemia: Secondary | ICD-10-CM | POA: Diagnosis present

## 2023-09-22 DIAGNOSIS — R310 Gross hematuria: Secondary | ICD-10-CM | POA: Diagnosis not present

## 2023-09-22 DIAGNOSIS — L89892 Pressure ulcer of other site, stage 2: Secondary | ICD-10-CM | POA: Diagnosis not present

## 2023-09-22 DIAGNOSIS — F17210 Nicotine dependence, cigarettes, uncomplicated: Secondary | ICD-10-CM | POA: Diagnosis present

## 2023-09-22 DIAGNOSIS — Z66 Do not resuscitate: Secondary | ICD-10-CM | POA: Diagnosis not present

## 2023-09-22 DIAGNOSIS — Z823 Family history of stroke: Secondary | ICD-10-CM | POA: Diagnosis not present

## 2023-09-22 LAB — CBC
HCT: 44.5 % (ref 39.0–52.0)
Hemoglobin: 15.7 g/dL (ref 13.0–17.0)
MCH: 30 pg (ref 26.0–34.0)
MCHC: 35.3 g/dL (ref 30.0–36.0)
MCV: 85.1 fL (ref 80.0–100.0)
Platelets: 193 10*3/uL (ref 150–400)
RBC: 5.23 MIL/uL (ref 4.22–5.81)
RDW: 14.1 % (ref 11.5–15.5)
WBC: 12 10*3/uL — ABNORMAL HIGH (ref 4.0–10.5)
nRBC: 0 % (ref 0.0–0.2)

## 2023-09-22 LAB — BASIC METABOLIC PANEL
Anion gap: 10 (ref 5–15)
BUN: 20 mg/dL (ref 8–23)
CO2: 27 mmol/L (ref 22–32)
Calcium: 9 mg/dL (ref 8.9–10.3)
Chloride: 97 mmol/L — ABNORMAL LOW (ref 98–111)
Creatinine, Ser: 1.22 mg/dL (ref 0.61–1.24)
GFR, Estimated: >60 mL/min (ref >60)
Glucose, Bld: 103 mg/dL — ABNORMAL HIGH (ref 70–99)
Potassium: 3.5 mmol/L (ref 3.5–5.1)
Sodium: 134 mmol/L — ABNORMAL LOW (ref 135–145)

## 2023-09-22 LAB — MAGNESIUM: Magnesium: 2 mg/dL (ref 1.7–2.4)

## 2023-09-22 LAB — MRSA NEXT GEN BY PCR, NASAL: MRSA by PCR Next Gen: NOT DETECTED

## 2023-09-22 MED ORDER — LACTULOSE ENEMA
300.0000 mL | ORAL | Status: DC
  Administered 2023-09-23 – 2023-10-07 (×7): 300 mL via RECTAL
  Filled 2023-09-22 (×10): qty 300

## 2023-09-22 NOTE — Progress Notes (Addendum)
Pt has no Telemetry order. MD Montez Morita was notified if we needed a telemetry for the pt due to his primary concern of paralysis. Telemetry was applied until we have definite plan from the MD.  No telemetry needed per Dr. Montez Morita.  Admission RN was not able to complete Admission question due to pt's minimal confusion, no family at bedside.

## 2023-09-22 NOTE — ED Notes (Signed)
Patient left the floor in stable condition, AO, with his belongings and staff.

## 2023-09-22 NOTE — Progress Notes (Signed)
Progress Note   Patient: Austin Weaver QIH:[id number] DOB: 14-Mar-1946 DOA: 09/21/2023     0 DOS: the patient was seen and examined on 09/22/2023   Brief hospital course: Austin Weaver is a 77 y.o. male with dementia, HTN, stroke who is a long-term nursing home patient at [place]. At baseline, patient is able to ambulate.  The day prior to admission he notes that his legs were weak and gave out. In the ED was retaining urine and had diminished rectal tone.   MRI lumbar spine showed 1. Large contrast-enhancing lesion involving the T8 vertebral body worrisome for malignancy -lymphoma versus metastasis. There is epidural spread of tumor resulting in severe spinal canal stenosis with compression of the spinal cord at the T8 level. 2. Additional smaller contrast-enhancing lesions in the T10, T12, and S1 vertebral bodies, concerning for additional sites of metastatic disease.  EDP spoke with neurosurgery on-call Dr. Danielle Dess.  Per neurosurgery, decompression is unlikely to be successful in this patient with his degree of varices and other comorbidities.  Steroids unlikely to be helpful.  Patient did not have any pain.   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  Steroids unlikely to be helpful. -Foley catheter for neurogenic bladder -Every other day enema -Pending palliative discussion with legal guardian will reach out to oncology  -Palliative following  Hypokalemia Resolved  Hypertension Continue home amlodipine and hydrochlorothiazide  Hyperlipidemia Continue home statin  Leukocytosis  Mild, no signs or symptoms of infection.  -Continue to monitor     Subjective: Patient awake and alert. He was not aware of any of the results from the tests that were run. He has some lower back pain but otherwise no complaints. Denies shortness of breath or chest pain.     Physical Exam: Vitals:   09/22/23 0400 09/22/23 0430 09/22/23 0818 09/22/23 0945  BP: 106/69  (!) 154/86   Pulse: 79  (!) 39 84  Resp: 13  16 20   Temp:  98 F (36.7 C) 98.4 F (36.9 C)   TempSrc:      SpO2: 100%  91% 100%   Constitutional: Well-developed, well-nourished, and in no distress.   Cardiovascular: Normal rate, regular rhythm. No lower extremity edema  Pulmonary: Non labored breathing on room air, no wheezing or rales  Abdominal: Soft. Normal bowel sounds. Non distended and non tender Musculoskeletal: Normal range of motion. TTP of lower back at T8 bilateral back  Neurological: Alert and oriented to person, place. 0/5 strength of BLE  Skin: Skin is warm and dry.   Data Reviewed: I have reviewed labs and images    Latest Ref Rng & Units 09/22/2023    3:36 AM 09/21/2023   11:32 AM 05/19/2023    3:59 AM  BMP  Glucose 70 - 99 mg/dL 875  643  89   BUN 8 - 23 mg/dL 20  16  13    Creatinine 0.61 - 1.24 mg/dL 3.29  5.18  8.41   Sodium 135 - 145 mmol/L 134  136  134   Potassium 3.5 - 5.1 mmol/L 3.5  3.3  3.4   Chloride 98 - 111 mmol/L 97  96  101   CO2 22 - 32 mmol/L 27  27  20  Calcium 8.9 - 10.3 mg/dL 9.0  9.3  8.1       Latest Ref Rng & Units 09/22/2023    3:36 AM 09/21/2023   11:32 AM 05/19/2023    3:59 AM  CBC  WBC 4.0 - 10.5 K/uL 12.0  13.4  7.9   Hemoglobin 13.0 - 17.0 g/dL 18.8  41.6  60.6   Hematocrit 39.0 - 52.0 % 44.5  43.0  40.2   Platelets 150 - 400 K/uL 193  209  179      Disposition: Status is: Inpatient Remains inpatient appropriate because: pending   Planned Discharge Destination:  Pending OT/PT eval, GOC   Time spent: 35 minutes  Author: Marolyn Haller, MD 09/22/2023 7:47 PM  For on call review [email or web address].

## 2023-09-22 NOTE — Consult Note (Signed)
Palliative Care Consult Note                                  Date: 09/22/2023   Patient Name: Austin Weaver  DOB: 1945-11-26  MRN: [id number]  Age / Sex: 77 y.o., male  PCP: Mort Sawyers, FNP Referring Physician: Marolyn Haller, MD  Reason for Consultation: Establishing goals of care  HPI/Patient Profile: 77 y.o. male  with past medical history of  dementia, HTN, stroke who is a long-term nursing home patient at [facility]. At baseline, patient is able to ambulate.  Presented to the emergency department by EMS for leg weakness.  He was admitted on 09/21/2023 with acute paraplegia due to large spinal malignancy, and others.   Palliative medicine was consulted for GOC conversations.  Of note the patient has a legal guardian.  Past Medical History:  Diagnosis Date   Allergy    Fall 09/21/2018   History of transient ischemic attack (TIA) 11/20/2017   History of transient ischemic attack (TIA) 11/20/2017   Hypertension    Old Cerebral infarction (HCC) 09/21/2018   Remote L. thalamic and R. external capsul & corona radiata infarcts   Stroke [facility])     Subjective:   This NP Wynne Dust reviewed medical records, received report from team, assessed the patient and then meet at the patient's bedside to discuss diagnosis, prognosis, GOC, EOL wishes disposition and options.  I met with the patient at the bedside, no family or guardian was present.   We meet to discuss diagnosis prognosis, GOC, EOL wishes, disposition and options. Concept of Palliative Care was introduced as specialized medical care for people and their families living with serious illness.  If focuses on providing relief from the symptoms and stress of a serious illness.  The goal is to improve quality of life for both the patient and the family. Values and goals of care important to patient and family were attempted to be elicited.  Created space and opportunity for  patient  and family to explore thoughts and feelings regarding current medical situation   Natural trajectory and current clinical status were discussed. Questions and concerns addressed. Patient  encouraged to call with questions or concerns.    Patient/Family Understanding of Illness: The patient states his legs went out.  When I asked what the doctors of told him about why his legs are weak and gave out he states that he has not talked to a doctor and he is not sure why.  He is awake, alert, oriented x 2 (person and place) but not time.  I do not feel he has capacity to understand his current clinical situation and make competent decisions.  Regardless he also has a guardian.  I asked his permission in order to help provide an element of control and he gives me permission to call his guardian to discuss his care.  Life Review: Deferred  Patient Values: Deferred  Goals: Deferred  Today's Discussion: In addition to discussions described above, I had a discussion with the patient on other topics.  He states that he is not hurting currently.  Intermittently his low back does hurt.  He knows he lives in a nursing home.  He does not remember being told about why his legs gave out.  He denies nausea and vomiting.  His speech is a bit delayed and stuttering, although he is able to meaningfully communicate.  However, I cannot definitively  say whether he has capacity to truly understand he is likely cancer diagnosis with apparent limited options.  I attempted to call the patient's guardian at the contact information and the facesheet.  However, I was unable to reach the guardian.  I provided emotional and general support through therapeutic listening, empathy, sharing of stories, and other techniques. I answered all questions and addressed all concerns to the best of my ability.  Review of Systems  Respiratory:  Negative for shortness of breath.   Cardiovascular:  Negative for chest pain.   Gastrointestinal:  Negative for abdominal pain, nausea and vomiting.  Musculoskeletal:  Positive for back pain (Lower back).    Objective:   Primary Diagnoses: Present on Admission:  Acute paraplegia [facility])   Physical Exam Vitals and nursing note reviewed.  Constitutional:      General: He is not in acute distress.    Appearance: He is ill-appearing.  HENT:     Head: Normocephalic and atraumatic.  Cardiovascular:     Rate and Rhythm: Normal rate.  Pulmonary:     Effort: Pulmonary effort is normal. No respiratory distress.  Abdominal:     General: Abdomen is flat. Bowel sounds are normal. There is no distension.     Palpations: Abdomen is soft.  Skin:    General: Skin is warm and dry.  Neurological:     Mental Status: He is alert.     Comments: Slow in response, stuttering/mumbled speech  Psychiatric:        Mood and Affect: Mood normal.        Behavior: Behavior normal.     Vital Signs:  BP (!) 154/86   Pulse 84   Temp 98.4 F (36.9 C)   Resp 20   SpO2 100%   Palliative Assessment/Data: 40%    Advanced Care Planning:   Existing Vynca/ACP Documentation: Advance directive signed 04/01/2022  Primary Decision Maker: LEGAL GUARDIAN  Code Status/Advance Care Planning: Full code  A discussion was had today regarding advanced directives. Concepts specific to code status, artifical feeding and hydration, continued IV antibiotics and rehospitalization was had.  The difference between a aggressive medical intervention path and a palliative comfort care path for this patient at this time was had.   Decisions/Changes to ACP: None today  Assessment & Plan:   Impression: 77 year old male with acute presentation chronic comorbidities as described above.  The patient has a legal guardian and, regardless, I am not sure he can understand his current clinical situation.  He was told about the tumor by the emergency room provider, however he does not remember this.  He  notes some lower back pain, consistent with his large lumbar tumor, but no other pain, nausea, vomiting.  I have attempted to call the patient's guardian, as has the hospital provider, however without success.  At this time he will remain a full code and full scope.  When I am able to reach the guardian we will discuss CODE STATUS and other goals of care in the setting of his newly diagnosed illness.  Overall prognosis poor.  SUMMARY OF RECOMMENDATIONS   Remain full code Full scope of care Continue attempts to contact guardian Further GOC conversations with guardian Palliative medicine will continue to follow  Symptom Management:  Per primary team PMT is available to assist as needed  Prognosis:  Unable to determine  Discharge Planning:  To Be Determined   Discussed with: Patient, medical team, nursing team    Thank you for allowing [place]  to participate in the care of Tj Vegter PMT will continue to support holistically.  Time Total: 40 min  Detailed review of medical records (labs, imaging, vital signs), medically appropriate exam, discussed with treatment team, counseling and education to patient, family, & staff, documenting clinical information, medication management, coordination of care  Signed by: Wynne Dust, NP Palliative Medicine Team  Team Phone # [phone] (Nights/Weekends)  09/22/2023, 11:11 AM

## 2023-09-22 NOTE — ED Notes (Signed)
ED TO INPATIENT HANDOFF REPORT  ED Nurse Name and Phone #: Charmaine Downs  S Name/Age/Gender Austin Weaver 77 y.o. male Room/Bed: 012C/012C  Code Status   Code Status: Full Code  Home/SNF/Other Skilled nursing facility Patient oriented to: self, place, and time Is this baseline? Yes   Triage Complete: Triage complete  Chief Complaint Acute paraplegia [facility]) [G82.20]  Triage Note Patient BIB [facility] from [facility] for decreased use of his legs. Patient typically able to ambulate and got to bathroom yesterday but could not get back. Today patient nonambulatory and no sensation in his legs, bilaterally. Patient has equal strength in both arms, no unilateral deficits.   Allergies No Known Allergies  Level of Care/Admitting Diagnosis ED Disposition     ED Disposition  Admit   Condition  --   Comment  Hospital Area: [facility] [[id number]]  Level of Care: Med-Surg [16]  May place patient in observation at [facility] or [facility] if equivalent level of care is available:: Yes  Covid Evaluation: Asymptomatic - no recent exposure (last 10 days) testing not required  Diagnosis: Acute paraplegia [facility]) [[id number]]  Admitting Physician: Lorin Glass [[id number]]  Attending Physician: Lorin Glass [[id number]]          B Medical/Surgery History Past Medical History:  Diagnosis Date   Allergy    Fall 09/21/2018   History of transient ischemic attack (TIA) 11/20/2017   History of transient ischemic attack (TIA) 11/20/2017   Hypertension    Old Cerebral infarction (HCC) 09/21/2018   Remote L. thalamic and R. external capsul & corona radiata infarcts   Stroke [facility])    Past Surgical History:  Procedure Laterality Date   laporotomy       A IV Location/Drains/Wounds Patient Lines/Drains/Airways Status     Active Line/Drains/Airways     Name Placement date Placement time Site Days   Peripheral IV 09/21/23 22 G Right;Lateral Wrist 09/21/23  1636  Wrist  1    External Urinary Catheter 09/21/23  1817  --  1            Intake/Output Last 24 hours No intake or output data in the 24 hours ending 09/22/23 0959  Labs/Imaging Results for orders placed or performed during the hospital encounter of 09/21/23 (from the past 48 hours)  CBC with Differential     Status: Abnormal   Collection Time: 09/21/23 11:32 AM  Result Value Ref Range   WBC 13.4 (H) 4.0 - 10.5 K/uL   RBC 5.06 4.22 - 5.81 MIL/uL   Hemoglobin 15.1 13.0 - 17.0 g/dL   HCT 18.2 99.3 - 71.6 %   MCV 85.0 80.0 - 100.0 fL   MCH 29.8 26.0 - 34.0 pg   MCHC 35.1 30.0 - 36.0 g/dL   RDW 96.7 89.3 - 81.0 %   Platelets 209 150 - 400 K/uL   nRBC 0.0 0.0 - 0.2 %   Neutrophils Relative % 80 %   Neutro Abs 10.8 (H) 1.7 - 7.7 K/uL   Lymphocytes Relative 11 %   Lymphs Abs 1.4 0.7 - 4.0 K/uL   Monocytes Relative 8 %   Monocytes Absolute 1.0 0.1 - 1.0 K/uL   Eosinophils Relative 0 %   Eosinophils Absolute 0.1 0.0 - 0.5 K/uL   Basophils Relative 0 %   Basophils Absolute 0.0 0.0 - 0.1 K/uL   Immature Granulocytes 1 %   Abs Immature Granulocytes 0.07 0.00 - 0.07 K/uL    Comment: Performed at [facility]  Lab, [address], [place], [place] [address]  Comprehensive metabolic panel     Status: Abnormal   Collection Time: 09/21/23 11:32 AM  Result Value Ref Range   Sodium 136 135 - 145 mmol/L   Potassium 3.3 (L) 3.5 - 5.1 mmol/L   Chloride 96 (L) 98 - 111 mmol/L   CO2 27 22 - 32 mmol/L   Glucose, Bld 109 (H) 70 - 99 mg/dL    Comment: Glucose reference range applies only to samples taken after fasting for at least 8 hours.   BUN 16 8 - 23 mg/dL   Creatinine, Ser 6.04 0.61 - 1.24 mg/dL   Calcium 9.3 8.9 - 54.0 mg/dL   Total Protein 6.8 6.5 - 8.1 g/dL   Albumin 3.3 (L) 3.5 - 5.0 g/dL   AST 22 15 - 41 U/L   ALT 15 0 - 44 U/L   Alkaline Phosphatase 76 38 - 126 U/L   Total Bilirubin 1.1 <1.2 mg/dL   GFR, Estimated >98 >11 mL/min    Comment: (NOTE) Calculated using the CKD-EPI Creatinine  Equation (2021)    Anion gap 13 5 - 15    Comment: Performed at [facility] Lab, [address] [place], [place] [address]  Magnesium     Status: None   Collection Time: 09/21/23 11:32 AM  Result Value Ref Range   Magnesium 2.0 1.7 - 2.4 mg/dL    Comment: Performed at [facility] Lab, [address] [place], [place] [address]  Basic metabolic panel     Status: Abnormal   Collection Time: 09/22/23  3:36 AM  Result Value Ref Range   Sodium 134 (L) 135 - 145 mmol/L   Potassium 3.5 3.5 - 5.1 mmol/L   Chloride 97 (L) 98 - 111 mmol/L   CO2 27 22 - 32 mmol/L   Glucose, Bld 103 (H) 70 - 99 mg/dL    Comment: Glucose reference range applies only to samples taken after fasting for at least 8 hours.   BUN 20 8 - 23 mg/dL   Creatinine, Ser 1.30 0.61 - 1.24 mg/dL   Calcium 9.0 8.9 - 86.5 mg/dL   GFR, Estimated >78 >46 mL/min    Comment: (NOTE) Calculated using the CKD-EPI Creatinine Equation (2021)    Anion gap 10 5 - 15    Comment: Performed at [facility] Lab, [address] [place], [place] [address]  CBC     Status: Abnormal   Collection Time: 09/22/23  3:36 AM  Result Value Ref Range   WBC 12.0 (H) 4.0 - 10.5 K/uL   RBC 5.23 4.22 - 5.81 MIL/uL   Hemoglobin 15.7 13.0 - 17.0 g/dL   HCT 28.4 13.2 - 44.0 %   MCV 85.1 80.0 - 100.0 fL   MCH 30.0 26.0 - 34.0 pg   MCHC 35.3 30.0 - 36.0 g/dL   RDW 10.2 72.5 - 36.6 %   Platelets 193 150 - 400 K/uL   nRBC 0.0 0.0 - 0.2 %    Comment: Performed at [facility] Lab, [address], [place], [place] [address]  Magnesium     Status: None   Collection Time: 09/22/23  3:36 AM  Result Value Ref Range   Magnesium 2.0 1.7 - 2.4 mg/dL    Comment: Performed at [facility] Lab, [address] [place], [place] [address]   MR THORACIC SPINE W WO CONTRAST Result Date: 09/21/2023 CLINICAL DATA:  Mid-back pain, neuro deficit Trunk numbness  or tingling no sensation below t10; Myelopathy, acute, lumbar spine no sensation below t10 EXAM:  MRI THORACIC AND LUMBAR SPINE WITHOUT AND WITH CONTRAST TECHNIQUE: Multiplanar and multiecho pulse sequences of the thoracic and lumbar spine were obtained without and with intravenous contrast. CONTRAST:  7mL GADAVIST GADOBUTROL 1 MMOL/ML IV SOLN COMPARISON:  None Available. FINDINGS: MRI THORACIC SPINE FINDINGS Alignment:  Physiologic. Vertebrae: There is a large contrast-enhancing lesion involving the anterior and posterior aspect of the T8 vertebral body as well as the posterior elements. There is epidural spread of tumor resulting in severe spinal canal stenosis of the T8 level with compression of the spinal cord. There is an additional separate contrast-enhancing lesion in the T10 vertebral body (series 20, image 10) in the T12 vertebral body (series 18, image 6). Cord: No cord signal abnormality. There is severe spinal canal stenosis with mass effect on the spinal cord at the T8 level (series 8, image 7) Paraspinal and other soft tissues: Negative. Disc levels: Severe spinal canal stenosis with flattening of the spinal cord at the T8 level. There is epidural spread of tumor in the ventral and dorsal epidural space at the T8 level, extending superiorly to the T6 level (series 20, image 14) and inferiorly to involve the T9 level (series 20, image 12). MRI LUMBAR SPINE FINDINGS Segmentation:  Standard. Alignment:  Physiologic. Vertebrae: No fracture, evidence of discitis. There is a small contrast-enhancing lesion in the S1 vertebral body (series 18, image 8). There are chronic superior endplate compression deformities at L1, L2, and L3. Conus medullaris: Extends to the L1-L2 disc space level and appears normal. Paraspinal and other soft tissues: Bilateral T2 hyperintense renal lesions are favored to represent simple renal cysts requiring no further imaging workup Disc levels: T12-L1: Mild bilateral facet degenerative change. No spinal canal or neural foraminal narrowing. L1-L2: Mild bilateral facet degenerative  change. Minimal disc bulge. Mild spinal canal narrowing. No neural foraminal narrowing. L2-L3: Mild bilateral facet degenerative change. Small disc bulge. Mild spinal canal narrowing. Mild bilateral neural foraminal narrowing. L3-L4: Moderate bilateral facet degenerative change. Circumferential disc bulge. Mild-to-moderate spinal canal narrowing. Mild-to-moderate bilateral neural foraminal narrowing. L4-L5: Mild bilateral facet degenerative change. Circumferential disc bulge there is narrowing of the right lateral recess. Moderate right and mild left neural foraminal narrowing. Mild overall spinal canal narrowing. L5-S1: Severe disc space loss. Mild bilateral facet degenerative change. No spinal canal narrowing. Mild-to-moderate bilateral neural foraminal narrowing. IMPRESSION: 1. Large contrast-enhancing lesion involving the T8 vertebral body is worrisome for malignancy. Differential considerations include lymphoma or a metastatic lesion. There is epidural spread of tumor resulting in severe spinal canal stenosis with compression of the spinal cord at the T8 level. 2. Additional smaller contrast-enhancing lesions in the T10, T12, and S1 vertebral bodies, concerning for additional sites of metastatic disease. Findings were discussed with Dr. Doran Durand on 09/21/23 at 5:33 PM. Electronically Signed   By: Lorenza Cambridge M.D.   On: 09/21/2023 17:37   MR Lumbar Spine W Wo Contrast Result Date: 09/21/2023 CLINICAL DATA:  Mid-back pain, neuro deficit Trunk numbness or tingling no sensation below t10; Myelopathy, acute, lumbar spine no sensation below t10 EXAM: MRI THORACIC AND LUMBAR SPINE WITHOUT AND WITH CONTRAST TECHNIQUE: Multiplanar and multiecho pulse sequences of the thoracic and lumbar spine were obtained without and with intravenous contrast. CONTRAST:  7mL GADAVIST GADOBUTROL 1 MMOL/ML IV SOLN COMPARISON:  None Available. FINDINGS: MRI THORACIC SPINE FINDINGS Alignment:  Physiologic. Vertebrae: There is a  large contrast-enhancing lesion involving the  anterior and posterior aspect of the T8 vertebral body as well as the posterior elements. There is epidural spread of tumor resulting in severe spinal canal stenosis of the T8 level with compression of the spinal cord. There is an additional separate contrast-enhancing lesion in the T10 vertebral body (series 20, image 10) in the T12 vertebral body (series 18, image 6). Cord: No cord signal abnormality. There is severe spinal canal stenosis with mass effect on the spinal cord at the T8 level (series 8, image 7) Paraspinal and other soft tissues: Negative. Disc levels: Severe spinal canal stenosis with flattening of the spinal cord at the T8 level. There is epidural spread of tumor in the ventral and dorsal epidural space at the T8 level, extending superiorly to the T6 level (series 20, image 14) and inferiorly to involve the T9 level (series 20, image 12). MRI LUMBAR SPINE FINDINGS Segmentation:  Standard. Alignment:  Physiologic. Vertebrae: No fracture, evidence of discitis. There is a small contrast-enhancing lesion in the S1 vertebral body (series 18, image 8). There are chronic superior endplate compression deformities at L1, L2, and L3. Conus medullaris: Extends to the L1-L2 disc space level and appears normal. Paraspinal and other soft tissues: Bilateral T2 hyperintense renal lesions are favored to represent simple renal cysts requiring no further imaging workup Disc levels: T12-L1: Mild bilateral facet degenerative change. No spinal canal or neural foraminal narrowing. L1-L2: Mild bilateral facet degenerative change. Minimal disc bulge. Mild spinal canal narrowing. No neural foraminal narrowing. L2-L3: Mild bilateral facet degenerative change. Small disc bulge. Mild spinal canal narrowing. Mild bilateral neural foraminal narrowing. L3-L4: Moderate bilateral facet degenerative change. Circumferential disc bulge. Mild-to-moderate spinal canal narrowing.  Mild-to-moderate bilateral neural foraminal narrowing. L4-L5: Mild bilateral facet degenerative change. Circumferential disc bulge there is narrowing of the right lateral recess. Moderate right and mild left neural foraminal narrowing. Mild overall spinal canal narrowing. L5-S1: Severe disc space loss. Mild bilateral facet degenerative change. No spinal canal narrowing. Mild-to-moderate bilateral neural foraminal narrowing. IMPRESSION: 1. Large contrast-enhancing lesion involving the T8 vertebral body is worrisome for malignancy. Differential considerations include lymphoma or a metastatic lesion. There is epidural spread of tumor resulting in severe spinal canal stenosis with compression of the spinal cord at the T8 level. 2. Additional smaller contrast-enhancing lesions in the T10, T12, and S1 vertebral bodies, concerning for additional sites of metastatic disease. Findings were discussed with Dr. Doran Durand on 09/21/23 at 5:33 PM. Electronically Signed   By: Lorenza Cambridge M.D.   On: 09/21/2023 17:37    Pending Labs Unresulted Labs (From admission, onward)     Start     Ordered   09/21/23 1136  Urinalysis, Routine w reflex microscopic -Urine, Clean Catch  Once,   URGENT       Question:  Specimen Source  Answer:  Urine, Clean Catch   09/21/23 1135            Vitals/Pain Today's Vitals   09/22/23 0400 09/22/23 0430 09/22/23 0818 09/22/23 0945  BP: 106/69  (!) 154/86   Pulse: 79  (!) 39 84  Resp: 13  16 20   Temp:  98 F (36.7 C) 98.4 F (36.9 C)   TempSrc:      SpO2: 100%  91% 100%  PainSc:        Isolation Precautions No active isolations  Medications Medications  acetaminophen (TYLENOL) tablet 650 mg (has no administration in time range)    Or  acetaminophen (TYLENOL) suppository 650 mg (has no administration  in time range)  polyethylene glycol (MIRALAX / GLYCOLAX) packet 17 g (has no administration in time range)  bisacodyl (DULCOLAX) EC tablet 5 mg (has no administration in  time range)  albuterol (PROVENTIL) (2.5 MG/3ML) 0.083% nebulizer solution 2.5 mg (has no administration in time range)  hydrALAZINE (APRESOLINE) injection 10 mg (has no administration in time range)  enoxaparin (LOVENOX) injection 40 mg (40 mg Subcutaneous Given 09/21/23 2140)  oxyCODONE (Oxy IR/ROXICODONE) immediate release tablet 5 mg (has no administration in time range)  morphine (PF) 2 MG/ML injection 1 mg (has no administration in time range)  amLODipine (NORVASC) tablet 10 mg (has no administration in time range)  atorvastatin (LIPITOR) tablet 40 mg (40 mg Oral Given 09/21/23 2140)  hydrochlorothiazide (HYDRODIURIL) tablet 12.5 mg (has no administration in time range)  gadobutrol (GADAVIST) 1 MMOL/ML injection 7 mL (7 mLs Intravenous Contrast Given 09/21/23 1637)  potassium chloride SA (KLOR-CON M) CR tablet 40 mEq (40 mEq Oral Given 09/21/23 1955)    Mobility Weakness, pain, due to tumor on his spine     Focused Assessments Pain, numbness   R Recommendations: See Admitting Provider Note  Report given to:   Additional Notes: AO, has a stutter, able to verbalize needs, was able to walk yesterday, but not this morning,

## 2023-09-23 DIAGNOSIS — Z515 Encounter for palliative care: Secondary | ICD-10-CM | POA: Diagnosis not present

## 2023-09-23 DIAGNOSIS — G8220 Paraplegia, unspecified: Secondary | ICD-10-CM | POA: Diagnosis not present

## 2023-09-23 DIAGNOSIS — G8389 Other specified paralytic syndromes: Secondary | ICD-10-CM | POA: Diagnosis not present

## 2023-09-23 LAB — BASIC METABOLIC PANEL
Anion gap: 14 (ref 5–15)
BUN: 27 mg/dL — ABNORMAL HIGH (ref 8–23)
CO2: 23 mmol/L (ref 22–32)
Calcium: 8.9 mg/dL (ref 8.9–10.3)
Chloride: 97 mmol/L — ABNORMAL LOW (ref 98–111)
Creatinine, Ser: 1.07 mg/dL (ref 0.61–1.24)
GFR, Estimated: >60 mL/min (ref >60)
Glucose, Bld: 91 mg/dL (ref 70–99)
Potassium: 4.5 mmol/L (ref 3.5–5.1)
Sodium: 134 mmol/L — ABNORMAL LOW (ref 135–145)

## 2023-09-23 LAB — CBC
HCT: 44.5 % (ref 39.0–52.0)
Hemoglobin: 15.3 g/dL (ref 13.0–17.0)
MCH: 29.6 pg (ref 26.0–34.0)
MCHC: 34.4 g/dL (ref 30.0–36.0)
MCV: 86.1 fL (ref 80.0–100.0)
Platelets: 197 10*3/uL (ref 150–400)
RBC: 5.17 MIL/uL (ref 4.22–5.81)
RDW: 14.3 % (ref 11.5–15.5)
WBC: 11.3 10*3/uL — ABNORMAL HIGH (ref 4.0–10.5)
nRBC: 0 % (ref 0.0–0.2)

## 2023-09-23 LAB — MAGNESIUM: Magnesium: 2.1 mg/dL (ref 1.7–2.4)

## 2023-09-23 NOTE — Progress Notes (Signed)
  Daily Progress Note   Patient Name: Austin Weaver       Date: 09/23/2023 DOB: 09/04/1946  Age: 77 y.o. MRN#: [id number] Attending Physician: Marolyn Haller, MD Primary Care Physician: Mort Sawyers, FNP Admit Date: 09/21/2023 Length of Stay: 1 day  Reason for Consultation/Follow-up: {Reason for Consult:23484}  HPI/Patient Profile:  ***  Subjective:   Subjective: Chart Reviewed. Updates received. Patient Assessed. Created space and opportunity for patient  and family to explore thoughts and feelings regarding current medical situation.  Today's Discussion: ***  Review of Systems  Objective:   Vital Signs:  BP (!) 150/133   Pulse 75   Temp 98.3 F (36.8 C) (Oral)   Resp 16   SpO2 98%   Physical Exam  Palliative Assessment/Data: ***    Existing Vynca/ACP Documentation: ***  Assessment & Plan:   Impression: Present on Admission:  Acute paraplegia (HCC)  ***  SUMMARY OF RECOMMENDATIONS   ***  Symptom Management:  ***  Code Status: {Palliative Code status:23503}  Prognosis: {Palliative Care Prognosis:23504}  Discharge Planning: {Palliative dispostion:23505}  Discussed with: ***  Thank you for allowing [place] to participate in the care of Matayo Upadhyay PMT will continue to support holistically.  Time Total: ***  Detailed review of medical records (labs, imaging, vital signs), medically appropriate exam, discussed with treatment team, counseling and education to patient, family, & staff, documenting clinical information, medication management, coordination of care  Wynne Dust, NP Palliative Medicine Team  Team Phone # [phone] (Nights/Weekends)  06/04/2021, 8:17 AM

## 2023-09-23 NOTE — Plan of Care (Signed)

## 2023-09-23 NOTE — Progress Notes (Signed)
Progress Note   Patient: Austin Weaver ION:[id number] DOB: 05/08/1946 DOA: 09/21/2023     1 DOS: the patient was seen and examined on 09/23/2023   Brief hospital course: Chibuikem Pall is a 77 y.o. male with dementia, HTN, stroke who is a long-term nursing home patient at [facility]. At baseline, patient is able to ambulate.  The day prior to admission he notes that his legs were weak and gave out. In the ED was retaining urine and had diminished rectal tone.   MRI lumbar spine showed 1. Large contrast-enhancing lesion involving the T8 vertebral body worrisome for malignancy -lymphoma versus metastasis. There is epidural spread of tumor resulting in severe spinal canal stenosis with compression of the spinal cord at the T8 level. 2. Additional smaller contrast-enhancing lesions in the T10, T12, and S1 vertebral bodies, concerning for additional sites of metastatic disease.  EDP spoke with neurosurgery on-call Dr. Danielle Dess.  Per neurosurgery, decompression is unlikely to be successful in this patient with his degree of varices and other comorbidities.  Steroids unlikely to be helpful.  Patient did not have any pain.   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  Steroids unlikely to be helpful. -Foley catheter for neurogenic bladder -Every other day enema -Pending palliative discussion with legal guardian will reach out to oncology  -Palliative following  Hypertension Continue home amlodipine and hydrochlorothiazide  Hyperlipidemia Continue home statin  Leukocytosis  Mild, no signs or symptoms of infection. Improving.  -Continue to monitor     Subjective: Patient awake and alert. He denies any complaints. He does not know who makes medical decisions for him.     Physical Exam: Vitals:   09/22/23 1952 09/23/23 0450 09/23/23 0749 09/23/23 1428  BP:   (!) 150/133 113/74  Pulse: 86 75 75 77  Resp: 17 16    Temp: 99.4 F (37.4 C) 98.2 F (36.8 C) 98.3 F (36.8 C) 98.2 F (36.8 C)  TempSrc: Oral Axillary Oral Oral  SpO2: 100% 99% 98% 97%   Physical Exam  Constitutional: Well-developed, well-nourished, and in no distress.  Cardiovascular: Normal rate, regular rhythm, intact distal pulses. No lower extremity edema  Pulmonary: Non labored breathing on room air, no wheezing or rales  Abdominal: Soft. Normal bowel sounds. Non distended and non tender Musculoskeletal: Normal range of motion.     Neurological: Alert and oriented to person, place, and time. 0/5 Strength of BLE Skin: Skin is warm and dry.    Data Reviewed: I have reviewed labs and images    Latest Ref Rng & Units 09/23/2023    6:11 AM 09/22/2023    3:36 AM 09/21/2023   11:32 AM  BMP  Glucose 70 - 99 mg/dL 91  244  010   BUN 8 - 23 mg/dL 27  20  16    Creatinine 0.61 - 1.24 mg/dL 2.72  5.36  6.44   Sodium 135 - 145 mmol/L 134  134  136   Potassium 3.5 - 5.1 mmol/L 4.5  3.5  3.3   Chloride 98 - 111 mmol/L 97  97  96   CO2 22 - 32 mmol/L 23  27  27    Calcium 8.9 - 10.3 mg/dL 8.9  9.0  9.3  Latest Ref Rng & Units 09/23/2023    6:11 AM 09/22/2023    3:36 AM 09/21/2023   11:32 AM  CBC  WBC 4.0 - 10.5 K/uL 11.3  12.0  13.4   Hemoglobin 13.0 - 17.0 g/dL 56.4  33.2  95.1   Hematocrit 39.0 - 52.0 % 44.5  44.5  43.0   Platelets 150 - 400 K/uL 197  193  209      Disposition: Status is: Inpatient Remains inpatient appropriate because: acute flaccid paralysis, safe discharge  Attempting to reach legal guardian  Palliative following   Planned Discharge Destination:  Pending OT/PT eval, GOC   Time spent: 35 minutes  Author: Marolyn Haller,  MD 09/23/2023 7:26 PM  For on call review [email or web address].

## 2023-09-24 DIAGNOSIS — G8220 Paraplegia, unspecified: Secondary | ICD-10-CM | POA: Diagnosis not present

## 2023-09-24 DIAGNOSIS — Z515 Encounter for palliative care: Secondary | ICD-10-CM | POA: Diagnosis not present

## 2023-09-24 DIAGNOSIS — Z7189 Other specified counseling: Secondary | ICD-10-CM | POA: Diagnosis not present

## 2023-09-24 DIAGNOSIS — G8389 Other specified paralytic syndromes: Secondary | ICD-10-CM | POA: Diagnosis not present

## 2023-09-24 MED ORDER — CHLORHEXIDINE GLUCONATE CLOTH 2 % EX PADS
6.0000 | MEDICATED_PAD | Freq: Every day | CUTANEOUS | Status: DC
  Administered 2023-09-24 – 2023-10-08 (×15): 6 via TOPICAL

## 2023-09-24 NOTE — Evaluation (Signed)
Physical Therapy Evaluation  Patient Details Name: Austin Weaver MRN: [id number] DOB: 1946/03/06 Today's Date: 09/24/2023  History of Present Illness  Pt is a 77 y/o male who presents 09/22/2023 with LE weakness, numbness, and inability to walk. Pt is from [facility] nursing facility where he is a long term resident. MRI revealed T8 vertebral body lesion concerning for malignancy - lymphoma vs metastasis, with additional smaller lesions at T10, T12, S1 vertebral bodies. Neurosurgery consulted, pt is not a surgical candidate. PMH significant for CVA/TIA, HTN, laporotomy.   Clinical Impression  Pt admitted with above diagnosis. Pt currently with functional limitations due to the deficits listed below (see PT Problem List). At the time of PT eval pt significantly limited by LE deficits. Pt reports no sensation from feet to hips, and did not observe any active movement throughout session. Maximove lift utilized for OOB to chair. Recommend post-acute therapy <3 hours/day to maximize functional independence and safety. Pt has experienced a significant decline in function, as he was previously able to ambulate with a RW and participate in ADL's. Acutely, pt will benefit from acute skilled PT to increase their independence and safety with mobility to allow discharge.           If plan is discharge home, recommend the following: Two people to help with walking and/or transfers;Two people to help with bathing/dressing/bathroom;Direct supervision/assist for medications management;Direct supervision/assist for financial management;Assist for transportation;Help with stairs or ramp for entrance;Supervision due to cognitive status   Can travel by private vehicle   No    Equipment Recommendations Wheelchair (measurements PT);Wheelchair cushion (measurements PT)  Recommendations for Other Services       Functional Status Assessment Patient has had a recent decline in their functional status and  demonstrates the ability to make significant improvements in function in a reasonable and predictable amount of time.     Precautions / Restrictions Precautions Precautions: Fall Precaution Comments: back for comfort Restrictions Weight Bearing Restrictions Per Provider Order: No      Mobility  Bed Mobility Overal bed mobility: Needs Assistance Bed Mobility: Rolling, Sidelying to Sit Rolling: Max assist, +2 for physical assistance Sidelying to sit: Max assist, +2 for physical assistance       General bed mobility comments: Pt able to use BUEs to reach for rails during rolling and transitioning to EOB, but overall needing assist for remainder of tasks    Transfers Overall transfer level: Needs assistance Equipment used: Ambulation equipment used Transfers: Bed to chair/wheelchair/BSC               Transfer via Lift Equipment: Maximove  Ambulation/Gait               General Gait Details: Unable at this time  Careers information officer     Tilt Bed    Modified Rankin (Stroke Patients Only)       Balance Overall balance assessment: Needs assistance Sitting-balance support: No upper extremity supported, Feet supported Sitting balance-Leahy Scale: Poor Sitting balance - Comments: brief moments of min guard to min assist, but L lateral and posterior lean statically with visual and verbal cueing able to correct back to midline but fatigues easily Postural control: Posterior lean, Left lateral lean                                   Pertinent Vitals/Pain Pain  Assessment Pain Assessment: Faces Faces Pain Scale: Hurts even more Pain Location: hypersensitive to abdomen, back with bed mobility Pain Descriptors / Indicators: Discomfort, Grimacing, Guarding Pain Intervention(s): Limited activity within patient's tolerance, Monitored during session, Repositioned    Home Living Family/patient expects to be discharged to:: Skilled  nursing facility                   Additional Comments: Lacinda Axon, pt states he has a RW and WC.    Prior Function Prior Level of Function : Needs assist;Patient poor historian/Family not available             Mobility Comments: reports ambulating to bathroom ADLs Comments: completing ADLs with indepedence, set up from staff     Extremity/Trunk Assessment   Upper Extremity Assessment Upper Extremity Assessment: Generalized weakness    Lower Extremity Assessment Lower Extremity Assessment: RLE deficits/detail;LLE deficits/detail RLE Deficits / Details: Pt with no active movement of B LE's noted this session. Reports complete sensation loss from feet to hips - confirmed with light touch testing. Reports decreased sensation to mid hips and pt appears hypersensitive with light touch testing to abdomen.    Cervical / Trunk Assessment Cervical / Trunk Assessment: Other exceptions Cervical / Trunk Exceptions: back pain, MRI with T8 lesion  Communication   Communication Communication: No apparent difficulties (stutters at times, increased time to finish thoughts) Cueing Techniques: Verbal cues;Gestural cues;Tactile cues  Cognition Arousal: Alert Behavior During Therapy: Anxious Overall Cognitive Status: Impaired/Different from baseline Area of Impairment: Orientation, Following commands, Memory, Safety/judgement, Awareness, Problem solving, Attention                 Orientation Level: Disoriented to, Time (place not assessed) Current Attention Level: Sustained Memory: Decreased recall of precautions, Decreased short-term memory Following Commands: Follows one step commands consistently, Follows one step commands with increased time, Follows multi-step commands inconsistently Safety/Judgement: Decreased awareness of deficits, Decreased awareness of safety Awareness: Intellectual Problem Solving: Slow processing, Decreased initiation, Difficulty sequencing, Requires  verbal cues, Requires tactile cues General Comments: He is anxious for mobilization, follows simple commands with increased time but demonstrates decreased problem solving.  He is a poor historian, questionable history.        General Comments General comments (skin integrity, edema, etc.): VSS on RA, BP stable 123/69 supine, 123/76 EOB, 117/76 in recliner    Exercises     Assessment/Plan    PT Assessment Patient needs continued PT services  PT Problem List Decreased strength;Decreased activity tolerance;Decreased balance;Decreased mobility;Decreased knowledge of use of DME;Decreased safety awareness;Decreased cognition;Decreased knowledge of precautions;Decreased range of motion;Decreased coordination;Impaired sensation;Pain       PT Treatment Interventions DME instruction;Gait training;Stair training;Functional mobility training;Therapeutic activities;Therapeutic exercise;Balance training;Patient/family education    PT Goals (Current goals can be found in the Care Plan section)  Acute Rehab PT Goals Patient Stated Goal: Get better PT Goal Formulation: With patient Time For Goal Achievement: 10/08/23 Potential to Achieve Goals: Good    Frequency Min 1X/week     Co-evaluation PT/OT/SLP Co-Evaluation/Treatment: Yes Reason for Co-Treatment: For patient/therapist safety;To address functional/ADL transfers PT goals addressed during session: Mobility/safety with mobility OT goals addressed during session: ADL's and self-care       AM-PAC PT "6 Clicks" Mobility  Outcome Measure Help needed turning from your back to your side while in a flat bed without using bedrails?: Total Help needed moving from lying on your back to sitting on the side of a flat bed without using bedrails?: Total Help  needed moving to and from a bed to a chair (including a wheelchair)?: Total Help needed standing up from a chair using your arms (e.g., wheelchair or bedside chair)?: Total Help needed to walk  in hospital room?: Total Help needed climbing 3-5 steps with a railing? : Total 6 Click Score: 6    End of Session   Activity Tolerance: Patient tolerated treatment well Patient left: in chair;with call bell/phone within reach;with chair alarm set Nurse Communication: Mobility status;Need for lift equipment PT Visit Diagnosis: Muscle weakness (generalized) (M62.81);Other symptoms and signs involving the nervous system (R29.898);Difficulty in walking, not elsewhere classified (R26.2);Pain Pain - part of body:  (back)    Time: 5366-4403 PT Time Calculation (min) (ACUTE ONLY): 34 min   Charges:   PT Evaluation $PT Eval Moderate Complexity: 1 Mod   PT General Charges $$ ACUTE PT VISIT: 1 Visit         Conni Slipper, PT, DPT Acute Rehabilitation Services Secure Chat Preferred Office: [phone]   Marylynn Pearson 09/24/2023, 2:59 PM

## 2023-09-24 NOTE — Plan of Care (Signed)

## 2023-09-24 NOTE — Progress Notes (Signed)
Daily Progress Note   Patient Name: Austin Weaver Weaver       Date: 09/24/2023 DOB: 1946/01/01  Age: 77 y.o. MRN#: [id number] Attending Physician: Cathren Harsh, MD Primary Care Physician: Mort Sawyers, FNP Admit Date: 09/21/2023 Length of Stay: 2 days  Reason for Consultation/Follow-up: Establishing goals of care  HPI/Patient Profile:  77 y.o. male  with past medical history of  dementia, HTN, stroke who is a long-term nursing home patient at [facility]. At baseline, patient is able to ambulate.  Presented to the emergency department by EMS for leg weakness.  He was admitted on 09/21/2023 with acute paraplegia due to large spinal malignancy, and others.    Palliative medicine was consulted for GOC conversations.  Of note the patient has a legal guardian.  Subjective:   Subjective: Chart Reviewed. Updates received. Patient Assessed. Created space and opportunity for patient  and family to explore thoughts and feelings regarding current medical situation.  Today's Discussion: Prior to seeing the patient I was notified that the patient's guardian was at the bedside.  I made attempts to get there before the guardian left, but was unsuccessful.  However, we received a correct name for his guardian and updated contact information.  Outdated information is likely the reason we are unable to reach his legal guardian previously.  I updated the facesheet including the patient's legal guardian and phone number, the program directors name and number, and division directors name and number.    Today saw the patient at the bedside.  He is awake, alert, oriented x 2 (not oriented to time).  He has ongoing short-term memory loss likely related to his chronic dementia.  He denies pain, nausea, vomiting.  Because the patient does not remember and cannot understand his current health situation I elected to not remind him that he has likely cancer.  We did talk about his legal guardian coming to visit today and  we talked about how it can be nice to have people come visit.  I got his permission to call and speak with his legal guardian (not legally necessary but providing him with control).  He agreed.  I called and spoke with the patient's legal guardian Austin Weaver Weaver.  We discussed how Dr. Isidoro Donning with the hospitalist service gave her an update on information and suggested a formal goals of care meeting.  However, Ms. Austin Weaver Weaver does not feel a formal meeting is necessary.  She instead requests a letter with formal medical recommendations to be sent to their office that she can present to her supervisor.  After discussing with Dr. Isidoro Donning for her input, I completed a letter with recommendations, summarized below (full letter available in "letters" tab):  DNR/DNI Biopsy for tissue identification Consult of further specialists (neurosurgery and/or oncology) for recommendations after tissue diagnosis complete; at this point we would make further recommendations based on specialist input  I faxed a letter to DSS and will await a response.  I provided emotional and general support through therapeutic listening, empathy, sharing of stories, therapeutic touch, and other techniques. I answered all questions and addressed all concerns to the best of my ability.  Review of Systems  Constitutional:        Denies pain in general  Respiratory:  Negative for shortness of breath.   Cardiovascular:  Negative for chest pain.  Gastrointestinal:  Negative for abdominal pain, nausea and vomiting.    Objective:   Vital Signs:  BP 108/65 (BP Location: Right Arm)   Pulse (!) 59  Temp 97.6 F (36.4 C) (Axillary)   Resp 16   SpO2 98%   Physical Exam Vitals and nursing note reviewed.  Constitutional:      General: He is not in acute distress.    Appearance: He is ill-appearing.  HENT:     Head: Normocephalic and atraumatic.  Cardiovascular:     Rate and Rhythm: Normal rate.  Pulmonary:     Effort: Pulmonary effort  is normal. No respiratory distress.     Breath sounds: No wheezing or rhonchi.  Abdominal:     General: Abdomen is flat. Bowel sounds are normal. There is no distension.     Palpations: Abdomen is soft.  Skin:    General: Skin is warm and dry.  Neurological:     Mental Status: He is alert. He is disoriented.     Comments: Some stuttering, short-term memory loss consistent with chronic diagnosis  Psychiatric:        Mood and Affect: Mood normal.        Behavior: Behavior normal.     Palliative Assessment/Data: 30%    Existing Vynca/ACP Documentation: Advance directive signed 04/01/2022   Assessment & Plan:   Impression: Present on Admission:  Acute paraplegia [facility])  77 year old male with acute presentation chronic comorbidities as described above. The patient has a legal guardian and, regardless, I am not sure he can understand his current clinical situation. He was told about the tumor by numerous providers, however he does not remember this. He notes some lower back pain, consistent with his large lumbar tumor, but no other pain, nausea, vomiting.  Pain improved in subsequent days.  Multiple unsuccessful attempts to contact legal guardian due to incorrect contact information, which has now been corrected.  Letter of formal recommendations from the medical team provided to DSS to help with decision-making. Overall prognosis poor.   SUMMARY OF RECOMMENDATIONS   Remain full code Continue full scope of care Await DSS response from formal medical recommendations letter sent Ongoing GOC conversations as clinical picture evolves Palliative medicine will continue to follow  Symptom Management:  Per primary team PMT is available to assist as needed  Code Status: Full code  Prognosis: Unable to determine  Discharge Planning: To Be Determined  Discussed with: Patient, medical team, nursing team  Thank you for allowing [place] to participate in the care of Austin Weaver Weaver PMT will  continue to support holistically.  Time Total: 90 min  Detailed review of medical records (labs, imaging, vital signs), medically appropriate exam, discussed with treatment team, counseling and education to patient, family, & staff, documenting clinical information, medication management, coordination of care  Wynne Dust, NP Palliative Medicine Team  Team Phone # [phone] (Nights/Weekends)  06/04/2021, 8:17 AM

## 2023-09-24 NOTE — Evaluation (Addendum)
Occupational Therapy Evaluation Patient Details Name: Austin Weaver MRN: [id number] DOB: 1946-09-09 Today's Date: 09/24/2023   History of Present Illness Pt is a 77 y/o male who presents 09/22/2023 with LE weakness, numbness, and inability to walk. Pt is from [facility] nursing facility where he is a long term resident. MRI revealed T8 vertebral body lesion concerning for malignancy - lymphoma vs metastasis, with additional smaller lesions at T10, T12, S1 vertebral bodies. Neurosurgery consulted, pt is not a surgical candidate. PMH significant for CVA/TIA, HTN, laporotomy.   Clinical Impression   PTA patient reports ambulating to the bathroom using RW, managing ADLs but pt is a long term care resident at [facility] nursing facility and demonstrates some difficulty providing a history so unsure of accuracy. Today, he requires setup to total assist for ADLs, max assist +2 for bed mobility and total assist +2 using maxi move to transfer into recliner. Requires min guard to max assist for sitting EOB statically. He is able to follow simple commands with increased time, disoriented to time.  He is hypersensitive in his trunk to touch, reports back pain with positional changes and rolling in bed.  BP stable throughout session. Based on performance today, believe patient will best benefit from continued OT services acutely and after dc at inpatient setting with <3hrs/day to optimize independence, safety with ADLs and mobility.      If plan is discharge home, recommend the following: Two people to help with walking and/or transfers;Two people to help with bathing/dressing/bathroom    Functional Status Assessment  Patient has had a recent decline in their functional status and demonstrates the ability to make significant improvements in function in a reasonable and predictable amount of time.  Equipment Recommendations  Other (comment) (defer)    Recommendations for Other Services        Precautions / Restrictions Precautions Precautions: Fall Precaution Comments: back for comfort Restrictions Weight Bearing Restrictions Per Provider Order: No      Mobility Bed Mobility Overal bed mobility: Needs Assistance Bed Mobility: Rolling, Sidelying to Sit Rolling: Max assist, +2 for physical assistance Sidelying to sit: Max assist, +2 for physical assistance       General bed mobility comments: pt able to use BUEs to reach for rails during rolling and transitioning to EOB, but overall needing assist for remainder of tasks    Transfers Overall transfer level: Needs assistance   Transfers: Bed to chair/wheelchair/BSC               Transfer via Lift Equipment: Maximove    Balance Overall balance assessment: Needs assistance Sitting-balance support: No upper extremity supported, Feet supported Sitting balance-Leahy Scale: Poor Sitting balance - Comments: brief moments of min guard to min assist, but L lateral and posterior lean statically with visual and verbal cueing able to correct back to midline but fatigues easily Postural control: Posterior lean, Left lateral lean                                 ADL either performed or assessed with clinical judgement   ADL Overall ADL's : Needs assistance/impaired     Grooming: Set up;Sitting           Upper Body Dressing : Maximal assistance;Sitting   Lower Body Dressing: Total assistance;+2 for physical assistance;Sitting/lateral leans   Toilet Transfer: Total assistance;+2 for physical assistance Toilet Transfer Details (indicate cue type and reason): maximove to reclienr  Functional mobility during ADLs: +2 for physical assistance;Maximal assistance       Vision   Vision Assessment?: No apparent visual deficits     Perception         Praxis         Pertinent Vitals/Pain Pain Assessment Pain Assessment: Faces Faces Pain Scale: Hurts even more Pain Location:  hypersensitive to abdomen, back with bed mobility Pain Descriptors / Indicators: Discomfort, Grimacing, Guarding Pain Intervention(s): Limited activity within patient's tolerance, Monitored during session, Repositioned     Extremity/Trunk Assessment Upper Extremity Assessment Upper Extremity Assessment: Generalized weakness;Right hand dominant (R hand tremors at times)   Lower Extremity Assessment Lower Extremity Assessment: Defer to PT evaluation   Cervical / Trunk Assessment Cervical / Trunk Assessment: Other exceptions Cervical / Trunk Exceptions: back pain, MRI with t8 lesion   Communication Communication Communication: No apparent difficulties (stutters at times, increased time to finish thoughts) Cueing Techniques: Verbal cues;Tactile cues   Cognition Arousal: Alert Behavior During Therapy: Anxious Overall Cognitive Status: Impaired/Different from baseline Area of Impairment: Orientation, Following commands, Memory, Safety/judgement, Awareness, Problem solving, Attention                 Orientation Level: Disoriented to, Time (place not assessed) Current Attention Level: Sustained Memory: Decreased recall of precautions, Decreased short-term memory Following Commands: Follows one step commands consistently, Follows one step commands with increased time, Follows multi-step commands inconsistently Safety/Judgement: Decreased awareness of deficits, Decreased awareness of safety Awareness: Intellectual Problem Solving: Slow processing, Decreased initiation, Difficulty sequencing, Requires verbal cues, Requires tactile cues General Comments: He is anxious for mobilization, follows simple commands with increased time but demonstrates decreased problem solving.  He is a poor historian, questionable history.     General Comments  VSS on RA, BP stable 123/69 supine, 123/76 EOB, 117/76 in recliner    Exercises     Shoulder Instructions      Home Living Family/patient  expects to be discharged to:: Skilled nursing facility                                 Additional Comments: Lacinda Axon, pt states he has a RW and WC.      Prior Functioning/Environment Prior Level of Function : Needs assist;Patient poor historian/Family not available             Mobility Comments: reports ambulating to bathroom ADLs Comments: completing ADLs with indepedence        OT Problem List: Decreased strength;Decreased range of motion;Decreased activity tolerance;Impaired balance (sitting and/or standing);Decreased coordination;Decreased cognition;Decreased safety awareness;Decreased knowledge of use of DME or AE;Decreased knowledge of precautions;Pain      OT Treatment/Interventions: Self-care/ADL training;Therapeutic exercise;DME and/or AE instruction;Therapeutic activities;Cognitive remediation/compensation;Patient/family education;Balance training    OT Goals(Current goals can be found in the care plan section) Acute Rehab OT Goals Patient Stated Goal: none stated Time For Goal Achievement: 10/08/23 Potential to Achieve Goals: Good  OT Frequency: Min 1X/week    Co-evaluation PT/OT/SLP Co-Evaluation/Treatment: Yes Reason for Co-Treatment: For patient/therapist safety;To address functional/ADL transfers PT goals addressed during session: Mobility/safety with mobility OT goals addressed during session: ADL's and self-care      AM-PAC OT "6 Clicks" Daily Activity     Outcome Measure Help from another person eating meals?: A Little Help from another person taking care of personal grooming?: A Little Help from another person toileting, which includes using toliet, bedpan, or urinal?: Total Help from another  person bathing (including washing, rinsing, drying)?: A Lot Help from another person to put on and taking off regular upper body clothing?: A Lot Help from another person to put on and taking off regular lower body clothing?: Total 6 Click Score:  12   End of Session Nurse Communication: Mobility status;Precautions;Need for lift equipment  Activity Tolerance: Patient tolerated treatment well Patient left: in chair;with call bell/phone within reach;with chair alarm set  OT Visit Diagnosis: Other abnormalities of gait and mobility (R26.89);Muscle weakness (generalized) (M62.81);Pain;Other symptoms and signs involving cognitive function Pain - part of body:  (back)                Time: 9629-5284 OT Time Calculation (min): 36 min Charges:  OT General Charges $OT Visit: 1 Visit OT Evaluation $OT Eval Moderate Complexity: 1 Mod  Barry Brunner, OT Acute Rehabilitation Services Office [phone]   Chancy Milroy 09/24/2023, 1:46 PM

## 2023-09-24 NOTE — TOC Initial Note (Signed)
Transition of Care [facility]) - Initial/Assessment Note    Patient Details  Name: Austin Weaver MRN: [id number] Date of Birth: 01-04-1946  [name]) CM/SW Contact:    Deatra Robinson, [place] Phone Number: 09/24/2023, 10:57 AM  Clinical Narrative:  Pt admitted from [facility]. Spoke to [place] in [place] admissions who confirmed pt is a LTC resident and is able to return at dc. Pt has a legal guardian through [facility] [place] [phone]. Spoke to guardian who confirmed plan is for pt to return to [place] at [facility]. SW will follow.    Dellie Burns, MSW, LCSW [phone] (coverage)                   Expected Discharge Plan: Skilled Nursing Facility Barriers to Discharge: Continued Medical Work up   Patient Goals and CMS Choice            Expected Discharge Plan and Services     Post Acute Care Choice: Skilled Nursing Facility Living arrangements for the past 2 months: Skilled Nursing Facility                                      Prior Living Arrangements/Services Living arrangements for the past 2 months: Skilled Nursing Facility Lives with:: Facility Resident Patient language and need for interpreter reviewed:: Yes          Care giver support system in place?: Yes (comment)   Criminal Activity/Legal Involvement Pertinent to Current Situation/Hospitalization: No - Comment as needed  Activities of Daily Living      Permission Sought/Granted Permission sought to share information with : Facility Medical sales representative, Guardian Permission granted to share information with : Yes, Verbal Permission Granted              Emotional Assessment       Orientation: : Oriented to Self Alcohol / Substance Use: Not Applicable Psych Involvement: No (comment)  Admission diagnosis:  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] Acute paraplegia (HCC) [G82.20] Patient Active Problem List   Diagnosis Date Noted    Acute paraplegia (HCC) 09/21/2023   Sepsis (HCC) 05/17/2023   Unable to care for self 02/20/2023   Vision changes 02/20/2023   Screening for tuberculosis 02/20/2023   Left carotid bruit 02/20/2023   Decreased pulses in feet 02/20/2023   Pedal edema 02/20/2023   Microalbuminuria 02/20/2023   Onychomycosis 05/28/2022   Nicotine dependence, cigarettes, uncomplicated 10/06/2021   Left-sided weakness 05/01/2021   Lack of access to transportation 05/01/2021   Mixed hyperlipidemia 01/11/2020   Allergies 12/01/2018   Essential hypertension 12/01/2018   Food insecurity 12/01/2018   Alzheimer's dementia with behavioral disturbance (HCC)    Acute metabolic encephalopathy    Vitamin B12 deficiency 09/22/2018   History of stroke 09/22/2018   Old Cerebral infarction (HCC) 09/21/2018   PCP:  Mort Sawyers, FNP Pharmacy:   [facility] [place] [place] - [address] [place] [address] Phone: [phone] Fax: [phone]     Social Drivers of Health (SDOH) Social History: SDOH Screenings   Depression (PHQ2-9): Low Risk  (07/11/2022)  Financial Resource Strain: Medium Risk (12/01/2018)  Tobacco Use: High Risk (09/21/2023)   SDOH Interventions:     Readmission Risk Interventions     No data to display

## 2023-09-24 NOTE — Progress Notes (Signed)
[facility]                                                                              Austin Weaver, is a 77 y.o. male, DOB - [birth year], UVO:[id number] Admit date - 09/21/2023    Outpatient Primary MD for the patient is Mort Sawyers, FNP  LOS - 2  days  Chief Complaint  Patient presents with   Numbness       Brief summary   Patient is a 77 year old male with dementia, HTN, prior stroke, long-term nursing home resident at [facility] facility.  At baseline patient is able to ambulate.  The day prior to admission he noted that his legs were weak and gave out.  In ED was noted to be having urinary retention and diminished rectal tone. MRI lumbar spine showed 1. Large contrast-enhancing lesion involving the T8 vertebral body worrisome for malignancy -lymphoma versus metastasis. There is epidural spread of tumor resulting in severe spinal canal stenosis with compression of the spinal cord at the T8 level. 2. Additional smaller contrast-enhancing lesions in the T10, T12, and S1 vertebral bodies, concerning for additional sites of metastatic disease.  EDP spoke with neurosurgery on-call Dr. Danielle Dess.  Per neurosurgery, decompression is unlikely to be successful in this patient with his degree of varices and other comorbidities.  Steroids unlikely to be helpful.  Patient did not have any pain.   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 Danielle Dess decompression unlikely to be successful given his age and comorbidities.  Steroids unlikely to be helpful. -Foley catheter for neurogenic bladder -I met with the patient's legal guardianship social worker, Cottie Banda at  the bedside today (phone #[phone]), updated all the above.  I also explained that it is unclear at this time if there is a large T8 vertebral body malignancy with severe spinal canal stenosis, epidural spread and lesions at T10, T12, S1 vertebral bodies is from primary spinal tumor versus metastasis of unknown primary.  Recommended IR guided biopsy for further diagnosis.  Based on results, will have oncology consultation for further management versus neurosurgery if it is primary spinal tumor.  Requested palliative medicine to also follow-up for goals of care today.   Hypokalemia -Resolved   Hypertension -Continue home amlodipine and hydrochlorothiazide   Hyperlipidemia -Continue home statin   Leukocytosis  -Mild, no signs or symptoms of infection.  -Continue to monitor     Estimated body mass index is 27.12 kg/m as calculated from the following:   Height as of 05/18/23: 5' 4.5" (1.638 m).   Weight as of 05/18/23: 72.8 kg.  Code Status: Full code DVT Prophylaxis:  enoxaparin (LOVENOX) injection 40 mg Start: 09/21/23 2200   Level of Care: Level of care: Med-Surg Family Communication: Updated patient's legal guardianship social worker at the bedside Disposition Plan:      Remains inpatient appropriate:  Procedures:    Consultants:   Palliative medicine Neurosurgery  Antimicrobials:   Anti-infectives (From admission, onward)    None          Medications  amLODipine  10 mg Oral Daily   atorvastatin  40 mg Oral QHS   enoxaparin (LOVENOX) injection  40 mg Subcutaneous Q24H   hydrochlorothiazide  12.5 mg Oral Daily   lactulose  300 mL Rectal QODAY      Subjective:   Austin Weaver was seen and examined today.  Alert and oriented to self, place and time.  Able to respond to questions.  Severe lower extremity weakness.  No acute fevers or chills.  Patient denies dizziness, chest pain, shortness of breath, abdominal pain, N/V. No acute events overnight.     Objective:   Vitals:   09/23/23 1428 09/23/23 1942 09/24/23 0339 09/24/23 0812  BP: 113/74   108/65  Pulse: 77 68 (!) 59   Resp:      Temp: 98.2 F (36.8 C) 98.4 F (36.9 C) 98.5 F (36.9 C) 97.6 F (36.4 C)  TempSrc: Oral Oral Oral Axillary  SpO2: 97% 100% 97% 98%    Intake/Output Summary (Last 24 hours) at 09/24/2023 1045 Last data filed at 09/24/2023 0819 Gross per 24 hour  Intake 890 ml  Output 1400 ml  Net -510 ml     Wt Readings from Last 3 Encounters:  05/18/23 72.8 kg  02/20/23 73.7 kg  07/11/22 78.2 kg     Exam General: Alert and oriented x 3, NAD Cardiovascular: S1 S2 auscultated,  RRR Respiratory: Clear to auscultation bilaterally, no wheezing, rales or rhonchi Gastrointestinal: Soft, nontender, nondistended, + bowel sounds Ext: no pedal edema bilaterally Neuro: flaccid paralysis of lower extremities bilaterally Psych: Normal affect, has dementia    Data Reviewed:  I have personally reviewed following labs    CBC Lab Results  Component Value Date   WBC 11.3 (H) 09/23/2023   RBC 5.17 09/23/2023   HGB 15.3 09/23/2023   HCT 44.5 09/23/2023   MCV 86.1 09/23/2023   MCH 29.6 09/23/2023   PLT 197 09/23/2023   MCHC 34.4 09/23/2023   RDW 14.3 09/23/2023   LYMPHSABS 1.4 09/21/2023   MONOABS 1.0 09/21/2023   EOSABS 0.1 09/21/2023   BASOSABS 0.0 09/21/2023     Last metabolic panel Lab Results  Component Value Date   NA 134 (L) 09/23/2023   K 4.5 09/23/2023   CL 97 (L) 09/23/2023   CO2 23 09/23/2023   BUN 27 (H) 09/23/2023   CREATININE 1.07 09/23/2023   GLUCOSE 91 09/23/2023   GFRNONAA >60 09/23/2023   GFRAA >60 11/18/2018   CALCIUM 8.9 09/23/2023   PHOS 2.5 09/21/2018   PROT 6.8 09/21/2023   ALBUMIN 3.3 (L) 09/21/2023   BILITOT 1.1 09/21/2023   ALKPHOS 76 09/21/2023   AST 22 09/21/2023   ALT 15 09/21/2023   ANIONGAP 14 09/23/2023    CBG (last 3)  No results for input(s): "GLUCAP" in the last 72 hours.    Coagulation  Profile: No results for input(s): "INR", "PROTIME" in the last 168 hours.   Radiology Studies: I have personally reviewed the imaging studies  No results found.     Thad Ranger M.D. [facility] 09/24/2023, 10:45 AM  Available via Epic secure chat 7am-7pm After 7 pm, please refer to night coverage provider listed on amion.

## 2023-09-24 NOTE — TOC Transition Note (Deleted)
Transition of Care [facility]) - Discharge Note   Patient Details  Name: Brenley Martinsen MRN: [id number] Date of Birth: Sep 11, 1946  Transition of Care [facility]) CM/SW Contact:  Deatra Robinson, [place] Phone Number: 09/24/2023, 10:55 AM   Clinical Narrative: Pt admitted from [facility]. Spoke to [facility] in [place] admissions who confirmed pt is a LTC resident and is able to return at dc. Pt has a legal guardian through [facility] [place] [phone]. Spoke to guardian who confirmed plan is for pt to return to [place] at [facility]. SW will follow.   Dellie Burns, MSW, LCSW [phone] (coverage)      Final next level of care: Skilled Nursing Facility Barriers to Discharge: Continued Medical Work up   Patient Goals and CMS Choice            Discharge Placement                       Discharge Plan and Services Additional resources added to the After Visit Summary for       Post Acute Care Choice: Skilled Nursing Facility                               Social Drivers of Health (SDOH) Interventions SDOH Screenings   Depression [phone]): Low Risk  (07/11/2022)  Financial Resource Strain: Medium Risk (12/01/2018)  Tobacco Use: High Risk (09/21/2023)     Readmission Risk Interventions     No data to display

## 2023-09-25 DIAGNOSIS — Z515 Encounter for palliative care: Secondary | ICD-10-CM | POA: Diagnosis not present

## 2023-09-25 DIAGNOSIS — G8220 Paraplegia, unspecified: Secondary | ICD-10-CM | POA: Diagnosis not present

## 2023-09-25 DIAGNOSIS — F03B4 Unspecified dementia, moderate, with anxiety: Secondary | ICD-10-CM

## 2023-09-25 DIAGNOSIS — G8389 Other specified paralytic syndromes: Secondary | ICD-10-CM | POA: Diagnosis not present

## 2023-09-25 DIAGNOSIS — Z7189 Other specified counseling: Secondary | ICD-10-CM | POA: Diagnosis not present

## 2023-09-25 NOTE — Progress Notes (Signed)
[facility]                                                                              [name], is a 77 y.o. male, DOB - [birth year], GGY:[id number] Admit date - 09/21/2023    Outpatient Primary MD for the patient is Mort Sawyers, FNP  LOS - 3  days  Chief Complaint  Patient presents with   Numbness       Brief summary   Patient is a 77 year old male with dementia, HTN, prior stroke, long-term nursing home resident at [facility] facility.  At baseline patient is able to ambulate.  The day prior to admission he noted that his legs were weak and gave out.  In ED was noted to be having urinary retention and diminished rectal tone. MRI lumbar spine showed 1. Large contrast-enhancing lesion involving the T8 vertebral body worrisome for malignancy -lymphoma versus metastasis. There is epidural spread of tumor resulting in severe spinal canal stenosis with compression of the spinal cord at the T8 level. 2. Additional smaller contrast-enhancing lesions in the T10, T12, and S1 vertebral bodies, concerning for additional sites of metastatic disease.  EDP spoke with neurosurgery on-call Dr. Danielle Dess.  Per neurosurgery, decompression is unlikely to be successful in this patient with his degree of varices and other comorbidities.  Steroids unlikely to be helpful.  Patient did not have any pain.   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 Danielle Dess decompression unlikely to be successful given his age and comorbidities.  Steroids unlikely to be helpful. -Continue Foley catheter for neurogenic bladder -Met with patient's legal guardianship social worker at bedside  yesterday on 12/19, see note  -Awaiting DSS response from the formal medication letter sent yesterday regarding how to proceed further.   -I had explained to the guardian ship social worker that it is unclear at this time if there is a large T8 vertebral body malignancy with severe spinal canal stenosis, epidural spread and lesions at T10, T12, S1 vertebral bodies is from primary spinal tumor versus metastasis of unknown primary.  Recommended IR guided biopsy for further diagnosis.  Based on results, will have oncology consultation for further management versus neurosurgery if it is primary spinal tumor.   -Palliative medicine also following for GOC to pursue further management  Dementia -Patient is alert and oriented to self and place.  No acute complaints, appears close to his baseline   Hypokalemia -Resolved   Hypertension -Continue home amlodipine and hydrochlorothiazide   Hyperlipidemia -Continues statin   Leukocytosis  -Mild, no signs or symptoms of infection.  -Continue to monitor     Estimated body mass index is 27.12 kg/m as calculated from the following:   Height as of 05/18/23: 5' 4.5" (1.638 m).   Weight as of 05/18/23: 72.8 kg.  Code Status: Full code DVT Prophylaxis:  enoxaparin (LOVENOX) injection 40 mg Start:  09/21/23 2200   Level of Care: Level of care: Med-Surg Family Communication: Updated patient's legal guardianship social worker at the bedside on 12/19 Disposition Plan:      Remains inpatient appropriate:      Procedures:    Consultants:   Palliative medicine Neurosurgery  Antimicrobials:   Anti-infectives (From admission, onward)    None          Medications  amLODipine  10 mg Oral Daily   atorvastatin  40 mg Oral QHS   Chlorhexidine Gluconate Cloth  6 each Topical Daily   enoxaparin (LOVENOX) injection  40 mg Subcutaneous Q24H   hydrochlorothiazide  12.5 mg Oral Daily   lactulose  300 mL Rectal QODAY      Subjective:   Austin Weaver was seen and examined today.  Alert and oriented to self and place.  Severe lower extremity weakness, no acute issues overnight.  No fevers or chills, chest pain, shortness of breath, nausea or vomiting.    Objective:   Vitals:   09/24/23 0812 09/24/23 2029 09/25/23 0415 09/25/23 0729  BP: 108/65 105/70 (!) 128/95 122/67  Pulse:   76 72  Resp:  16 18 18   Temp: 97.6 F (36.4 C) 97.8 F (36.6 C) 98.2 F (36.8 C) 97.9 F (36.6 C)  TempSrc: Axillary Oral Oral   SpO2: 98% 98% 100% 100%    Intake/Output Summary (Last 24 hours) at 09/25/2023 0934 Last data filed at 09/24/2023 1703 Gross per 24 hour  Intake --  Output 650 ml  Net -650 ml     Wt Readings from Last 3 Encounters:  05/18/23 72.8 kg  02/20/23 73.7 kg  07/11/22 78.2 kg    Physical Exam General: Alert and oriented x 3, NAD Cardiovascular: S1 S2 clear, RRR.  Respiratory: CTAB, no wheezing Gastrointestinal: Soft, nontender, nondistended, NBS Ext: no pedal edema bilaterally Neuro: flaccid paralysis of LE bilaterally Psych: dementia    Data Reviewed:  I have personally reviewed following labs    CBC Lab Results  Component Value Date   WBC 11.3 (H) 09/23/2023   RBC 5.17 09/23/2023   HGB 15.3 09/23/2023   HCT 44.5 09/23/2023   MCV 86.1 09/23/2023   MCH 29.6 09/23/2023   PLT 197 09/23/2023   MCHC 34.4 09/23/2023   RDW 14.3 09/23/2023   LYMPHSABS 1.4 09/21/2023   MONOABS 1.0 09/21/2023   EOSABS 0.1 09/21/2023   BASOSABS 0.0 09/21/2023     Last metabolic panel Lab Results  Component Value Date   NA 134 (L) 09/23/2023   K 4.5 09/23/2023   CL 97 (L) 09/23/2023   CO2 23 09/23/2023   BUN 27 (H) 09/23/2023   CREATININE 1.07 09/23/2023   GLUCOSE 91 09/23/2023   GFRNONAA >60 09/23/2023   GFRAA >60 11/18/2018   CALCIUM 8.9 09/23/2023   PHOS 2.5 09/21/2018   PROT 6.8 09/21/2023   ALBUMIN 3.3 (L) 09/21/2023   BILITOT 1.1 09/21/2023   ALKPHOS 76 09/21/2023   AST 22 09/21/2023   ALT 15  09/21/2023   ANIONGAP 14 09/23/2023    CBG (last 3)  No results for input(s): "GLUCAP" in the last 72 hours.    Coagulation Profile: No results for input(s): "INR", "PROTIME" in the last 168 hours.   Radiology Studies: I have personally reviewed the imaging studies  No results found.     Thad Ranger M.D. [facility] 09/25/2023, 9:34 AM  Available via Epic secure chat 7am-7pm After 7 pm, please refer to night coverage provider  listed on amion.

## 2023-09-25 NOTE — Care Management Important Message (Signed)
Important Message  Patient Details  Name: Collyn Stalvey MRN: [id number] Date of Birth: 1946/07/17   Important Message Given:  Yes - Medicare IM     Dorena Bodo 09/25/2023, 3:14 PM

## 2023-09-25 NOTE — Progress Notes (Signed)
Daily Progress Note   Patient Name: Austin Weaver       Date: 09/25/2023 DOB: 1945/11/06  Age: 77 y.o. MRN#: [id number] Attending Physician: Cathren Harsh, MD Primary Care Physician: Mort Sawyers, FNP Admit Date: 09/21/2023 Length of Stay: 3 days  Reason for Consultation/Follow-up: Establishing goals of care  HPI/Patient Profile:  77 y.o. male  with past medical history of  dementia, HTN, stroke who is a long-term nursing home patient at [place]. At baseline, patient is able to ambulate.  Presented to the emergency department by EMS for leg weakness.  He was admitted on 09/21/2023 with acute paraplegia due to large spinal malignancy, and others.    Palliative medicine was consulted for GOC conversations.  Of note the patient has a legal guardian.  Subjective:   Subjective: Chart Reviewed. Updates received. Patient Assessed. Created space and opportunity for patient  and family to explore thoughts and feelings regarding current medical situation.  Today's Discussion: Today saw the patient at the bedside.  He is awake, alert, and pleasant.  He is sitting up in the bed and his tray is at the bedside.  I asked him if he is having any pain and he denies pain, nausea, vomiting.  He states he has been eating well.  He is hungry now.  I got him a straw and helped him set up his food tray to assist him and starting to eat.  I told him glad he is feeling well and I would reach out to his legal guardian.  I told him I would follow-up with him tomorrow.  I attempted to contact the patient's guardian, left a voicemail for call back in order to confirm that she received the medical recommendations yesterday and to see if there is anything else they need to proceed.    Review of Systems  Constitutional:        Denies pain in general  Respiratory:  Negative for shortness of breath.   Cardiovascular:  Negative for chest pain.  Gastrointestinal:  Negative for abdominal pain, nausea and  vomiting.    Objective:   Vital Signs:  BP 104/62   Pulse 72   Temp 97.9 F (36.6 C)   Resp 18   SpO2 100%   Physical Exam Vitals and nursing note reviewed.  Constitutional:      General: He is not in acute distress.    Appearance: He is ill-appearing.  HENT:     Head: Normocephalic and atraumatic.  Cardiovascular:     Rate and Rhythm: Normal rate.  Pulmonary:     Effort: Pulmonary effort is normal. No respiratory distress.     Breath sounds: No wheezing or rhonchi.  Abdominal:     General: Abdomen is flat. Bowel sounds are normal. There is no distension.     Palpations: Abdomen is soft.  Skin:    General: Skin is warm and dry.  Neurological:     Mental Status: He is alert.     Comments: Some stuttering, short-term memory loss consistent with chronic diagnosis  Psychiatric:        Mood and Affect: Mood normal.        Behavior: Behavior normal.     Palliative Assessment/Data: 30%    Existing Vynca/ACP Documentation: Advance directive signed 04/01/2022   Assessment & Plan:   Impression: Present on Admission:  Acute paraplegia [facility])  77 year old male with acute presentation chronic comorbidities as described above. The patient has a legal guardian and, regardless, I am not sure  he can understand his current clinical situation. He was told about the tumor by the emergency room provider, however he does not remember this. He notes some lower back pain, consistent with his large lumbar tumor, but no other pain, nausea, vomiting.  We were able to reach out to the guardian and provided official medical recommendations for ongoing care.  We are awaiting response.  Overall prognosis poor.   SUMMARY OF RECOMMENDATIONS   Remain full code Continue full scope of care Await response from legal guardian on further care Further GOC conversations pending evolution of clinical picture Palliative medicine will continue to follow  Symptom Management:  Per primary team PMT is  available to assist as needed  Code Status: Full code  Prognosis: Unable to determine  Discharge Planning: To Be Determined  Discussed with: Patient, medical team, nursing team  Thank you for allowing [place] to participate in the care of Albino Mcclaran PMT will continue to support holistically.  Time Total: 45 min  Detailed review of medical records (labs, imaging, vital signs), medically appropriate exam, discussed with treatment team, counseling and education to patient, family, & staff, documenting clinical information, medication management, coordination of care  Wynne Dust, NP Palliative Medicine Team  Team Phone # [phone] (Nights/Weekends)  06/04/2021, 8:17 AM

## 2023-09-25 NOTE — Plan of Care (Signed)
  Problem: Education: Goal: Knowledge of General Education information will improve Description: Including pain rating scale, medication(s)/side effects and non-pharmacologic comfort measures Outcome: Progressing   Problem: Health Behavior/Discharge Planning: Goal: Ability to manage health-related needs will improve Outcome: Progressing   Problem: Clinical Measurements: Goal: Ability to maintain clinical measurements within normal limits will improve Outcome: Progressing Goal: Will remain free from infection Outcome: Progressing Goal: Diagnostic test results will improve Outcome: Progressing Goal: Respiratory complications will improve Outcome: Progressing   Problem: Activity: Goal: Risk for activity intolerance will decrease Outcome: Progressing   Problem: Nutrition: Goal: Adequate nutrition will be maintained Outcome: Progressing   Problem: Coping: Goal: Level of anxiety will decrease Outcome: Progressing   Problem: Elimination: Goal: Will not experience complications related to bowel motility Outcome: Progressing

## 2023-09-26 DIAGNOSIS — G8220 Paraplegia, unspecified: Secondary | ICD-10-CM | POA: Diagnosis not present

## 2023-09-26 DIAGNOSIS — Z7189 Other specified counseling: Secondary | ICD-10-CM | POA: Diagnosis not present

## 2023-09-26 DIAGNOSIS — Z515 Encounter for palliative care: Secondary | ICD-10-CM | POA: Diagnosis not present

## 2023-09-26 LAB — COMPREHENSIVE METABOLIC PANEL
ALT: 41 U/L (ref 0–44)
AST: 94 U/L — ABNORMAL HIGH (ref 15–41)
Albumin: 3.1 g/dL — ABNORMAL LOW (ref 3.5–5.0)
Alkaline Phosphatase: 71 U/L (ref 38–126)
Anion gap: 12 (ref 5–15)
BUN: 24 mg/dL — ABNORMAL HIGH (ref 8–23)
CO2: 26 mmol/L (ref 22–32)
Calcium: 8.9 mg/dL (ref 8.9–10.3)
Chloride: 97 mmol/L — ABNORMAL LOW (ref 98–111)
Creatinine, Ser: 1.24 mg/dL (ref 0.61–1.24)
GFR, Estimated: >60 mL/min (ref >60)
Glucose, Bld: 121 mg/dL — ABNORMAL HIGH (ref 70–99)
Potassium: 3.8 mmol/L (ref 3.5–5.1)
Sodium: 135 mmol/L (ref 135–145)
Total Bilirubin: 1 mg/dL (ref <1.2)
Total Protein: 7 g/dL (ref 6.5–8.1)

## 2023-09-26 LAB — CBC
HCT: 44.4 % (ref 39.0–52.0)
Hemoglobin: 15.5 g/dL (ref 13.0–17.0)
MCH: 29.8 pg (ref 26.0–34.0)
MCHC: 34.9 g/dL (ref 30.0–36.0)
MCV: 85.4 fL (ref 80.0–100.0)
Platelets: 225 10*3/uL (ref 150–400)
RBC: 5.2 MIL/uL (ref 4.22–5.81)
RDW: 14 % (ref 11.5–15.5)
WBC: 9.5 10*3/uL (ref 4.0–10.5)
nRBC: 0 % (ref 0.0–0.2)

## 2023-09-26 NOTE — Progress Notes (Signed)
Daily Progress Note   Patient Name: Austin Weaver       Date: 09/26/2023 DOB: 1946/05/03  Age: 77 y.o. MRN#: [id number] Attending Physician: Barnetta Chapel, MD Primary Care Physician: Mort Sawyers, FNP Admit Date: 09/21/2023 Length of Stay: 4 days  Reason for Consultation/Follow-up: Establishing goals of care  HPI/Patient Profile:  77 y.o. male  with past medical history of  dementia, HTN, stroke who is a long-term nursing home patient at [place]. At baseline, patient is able to ambulate.  Presented to the emergency department by EMS for leg weakness.  He was admitted on 09/21/2023 with acute paraplegia due to large spinal malignancy, and others.    Palliative medicine was consulted for GOC conversations.  Of note the patient has a legal guardian.  Subjective:   Subjective: Chart Reviewed. Updates received. Patient Assessed. Created space and opportunity for patient  and family to explore thoughts and feelings regarding current medical situation.  Today's Discussion: Today saw the patient at the bedside.  He is awake, alert, and pleasant.  He is sitting up in the bed and his tray is at the bedside.  I asked him if he is having any pain and he denies pain, nausea, vomiting.  I asked if he was hungry and he said yes.  I set up his meal tray and cut up his food for him.  He appeared a bit weak and difficult grasping the fork to eat.  I went to get him a straw and saw his nurse who said that she was on the way to help him eat.  I told her the trays already been set up and he just needs assistance.  I told him glad he is feeling well and that one of my coworkers would be following up next week.  I told the nurse that we are waiting to hear back from legal guardian and DSS about our recommendations to see how they would like to proceed.  Please note yesterday's note indicating attempt to call back to confirm receipt of recommendations and ETA on sign off as well as directions on how to  proceed with his care.  Review of Systems  Constitutional:        Denies pain in general  Respiratory:  Negative for shortness of breath.   Cardiovascular:  Negative for chest pain.  Gastrointestinal:  Negative for abdominal pain, nausea and vomiting.    Objective:   Vital Signs:  BP (!) 140/97   Pulse 71   Temp 98.1 F (36.7 C) (Oral)   Resp 18   SpO2 100%   Physical Exam Vitals and nursing note reviewed.  Constitutional:      General: He is not in acute distress.    Appearance: He is ill-appearing.  HENT:     Head: Normocephalic and atraumatic.  Cardiovascular:     Rate and Rhythm: Normal rate.  Pulmonary:     Effort: Pulmonary effort is normal. No respiratory distress.     Breath sounds: No wheezing or rhonchi.  Abdominal:     General: Abdomen is flat. Bowel sounds are normal. There is no distension.     Palpations: Abdomen is soft.  Skin:    General: Skin is warm and dry.  Neurological:     Mental Status: He is alert.     Comments: Some stuttering, short-term memory loss consistent with chronic diagnosis  Psychiatric:        Mood and Affect: Mood normal.  Behavior: Behavior normal.     Palliative Assessment/Data: 30%    Existing Vynca/ACP Documentation: Advance directive signed 04/01/2022   Assessment & Plan:   Impression: Present on Admission:  Acute paraplegia [facility])  77 year old male with acute presentation chronic comorbidities as described above. The patient has a legal guardian and, regardless, I am not sure he can understand his current clinical situation. He was told about the tumor by the emergency room provider, however he does not remember this. He notes some lower back pain, consistent with his large lumbar tumor, but no other pain, nausea, vomiting.  We were able to reach out to the guardian and provided official medical recommendations for ongoing care.  We are awaiting response.  Overall prognosis poor.   SUMMARY OF RECOMMENDATIONS    Remain full code Continue full scope of care Await response from legal guardian on further care Further GOC conversations pending evolution of clinical picture Palliative medicine will continue to follow next week  Symptom Management:  Per primary team PMT is available to assist as needed  Code Status: Full code  Prognosis: Unable to determine  Discharge Planning: To Be Determined  Discussed with: Patient, medical team, nursing team  Thank you for allowing [place] to participate in the care of Tyvell Prickett PMT will continue to support holistically.  Time Total: 45 min  Detailed review of medical records (labs, imaging, vital signs), medically appropriate exam, discussed with treatment team, counseling and education to patient, family, & staff, documenting clinical information, medication management, coordination of care  Wynne Dust, NP Palliative Medicine Team  Team Phone # [phone] (Nights/Weekends)  06/04/2021, 8:17 AM

## 2023-09-26 NOTE — Plan of Care (Signed)
  Problem: Education: Goal: Knowledge of General Education information will improve Description: Including pain rating scale, medication(s)/side effects and non-pharmacologic comfort measures Outcome: Progressing   Problem: Clinical Measurements: Goal: Ability to maintain clinical measurements within normal limits will improve Outcome: Progressing Goal: Will remain free from infection Outcome: Progressing Goal: Diagnostic test results will improve Outcome: Progressing Goal: Respiratory complications will improve Outcome: Progressing Goal: Cardiovascular complication will be avoided Outcome: Progressing   Problem: Activity: Goal: Risk for activity intolerance will decrease Outcome: Progressing   Problem: Nutrition: Goal: Adequate nutrition will be maintained Outcome: Progressing   Problem: Elimination: Goal: Will not experience complications related to bowel motility Outcome: Progressing Goal: Will not experience complications related to urinary retention Outcome: Progressing   Problem: Pain Management: Goal: General experience of comfort will improve Outcome: Progressing   Problem: Safety: Goal: Ability to remain free from injury will improve Outcome: Progressing

## 2023-09-26 NOTE — Progress Notes (Signed)
[facility]                                                                              Austin Weaver, is a 77 y.o. male, DOB - 14-Mar-1946, EXB:[id number] Admit date - 09/21/2023    Outpatient Primary MD for the patient is Mort Sawyers, FNP  LOS - 4  days  Chief Complaint  Patient presents with   Numbness       Brief summary  As per prior documentation "Patient is a 77 year old male with dementia, HTN, prior stroke, long-term nursing home resident at [facility] facility.  At baseline patient is able to ambulate.  The day prior to admission he noted that his legs were weak and gave out.  In ED was noted to be having urinary retention and diminished rectal tone. MRI lumbar spine showed 1. Large contrast-enhancing lesion involving the T8 vertebral body worrisome for malignancy -lymphoma versus metastasis. There is epidural spread of tumor resulting in severe spinal canal stenosis with compression of the spinal cord at the T8 level. 2. Additional smaller contrast-enhancing lesions in the T10, T12, and S1 vertebral bodies, concerning for additional sites of metastatic disease.  EDP spoke with neurosurgery on-call Dr. Danielle Dess.  Per neurosurgery, decompression is unlikely to be successful in this patient with his degree of varices and other comorbidities.  Steroids unlikely to be helpful.  Patient did not have any pain".   09/26/2023: Patient seen.  No significant history from patient.  No new changes.  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 Danielle Dess decompression unlikely to be successful given his age and comorbidities.  Steroids unlikely to be helpful. -Continue  Foley catheter for neurogenic bladder -Met with patient's legal guardianship social worker at bedside yesterday on 12/19, see note  -Awaiting DSS response from the formal medication letter sent yesterday regarding how to proceed further.   -I had explained to the guardian ship social worker that it is unclear at this time if there is a large T8 vertebral body malignancy with severe spinal canal stenosis, epidural spread and lesions at T10, T12, S1 vertebral bodies is from primary spinal tumor versus metastasis of unknown primary.  Recommended IR guided biopsy for further diagnosis.  Based on results, will have oncology consultation for further management versus neurosurgery if it is primary spinal tumor.   -Palliative medicine also following for GOC to pursue further management  Dementia -Patient is alert and oriented to self and place.  No acute complaints, appears close to his baseline   Hypokalemia -Resolved   Hypertension -Continue home amlodipine and hydrochlorothiazide   Hyperlipidemia -Continues statin   Leukocytosis  -Mild, no signs or symptoms of infection.  -Continue to monitor     Estimated body mass index is 27.12 kg/m as calculated from the following:   Height as of 05/18/23: 5' 4.5" (1.638 m).   Weight as of  05/18/23: 72.8 kg.  Code Status: Full code DVT Prophylaxis:  enoxaparin (LOVENOX) injection 40 mg Start: 09/21/23 2200   Level of Care: Level of care: Med-Surg Family Communication: Updated patient's legal guardianship social worker at the bedside on 12/19 Disposition Plan:      Remains inpatient appropriate:      Procedures:    Consultants:   Palliative medicine Neurosurgery  Antimicrobials:   Anti-infectives (From admission, onward)    None          Medications  amLODipine  10 mg Oral Daily   atorvastatin  40 mg Oral QHS   Chlorhexidine Gluconate Cloth  6 each Topical Daily   enoxaparin (LOVENOX) injection  40 mg Subcutaneous Q24H    hydrochlorothiazide  12.5 mg Oral Daily   lactulose  300 mL Rectal QODAY      Subjective:   No significant history from patient.  Objective:   Vitals:   09/25/23 1414 09/25/23 1915 09/26/23 0517 09/26/23 0708  BP: 99/86 126/74 113/76 (!) 140/97  Pulse: 78 92 80 71  Resp: 16 18 18    Temp: 98 F (36.7 C) 98.4 F (36.9 C) 97.9 F (36.6 C) 98.1 F (36.7 C)  TempSrc:  Oral Oral Oral  SpO2: 100% 99% 97% 100%   No intake or output data in the 24 hours ending 09/26/23 1904    Wt Readings from Last 3 Encounters:  05/18/23 72.8 kg  02/20/23 73.7 kg  07/11/22 78.2 kg    Physical Exam General: Chronically ill looking.  Not in any distress.  Awake and alert.   Cardiovascular: S1 S2   Respiratory: Clear to auscultation. Gastrointestinal: Soft and nontender. Neuro: flaccid paralysis of LE bilaterally  Data Reviewed:  I have personally reviewed following labs    CBC Lab Results  Component Value Date   WBC 9.5 09/26/2023   RBC 5.20 09/26/2023   HGB 15.5 09/26/2023   HCT 44.4 09/26/2023   MCV 85.4 09/26/2023   MCH 29.8 09/26/2023   PLT 225 09/26/2023   MCHC 34.9 09/26/2023   RDW 14.0 09/26/2023   LYMPHSABS 1.4 09/21/2023   MONOABS 1.0 09/21/2023   EOSABS 0.1 09/21/2023   BASOSABS 0.0 09/21/2023     Last metabolic panel Lab Results  Component Value Date   NA 135 09/26/2023   K 3.8 09/26/2023   CL 97 (L) 09/26/2023   CO2 26 09/26/2023   BUN 24 (H) 09/26/2023   CREATININE 1.24 09/26/2023   GLUCOSE 121 (H) 09/26/2023   GFRNONAA >60 09/26/2023   GFRAA >60 11/18/2018   CALCIUM 8.9 09/26/2023   PHOS 2.5 09/21/2018   PROT 7.0 09/26/2023   ALBUMIN 3.1 (L) 09/26/2023   BILITOT 1.0 09/26/2023   ALKPHOS 71 09/26/2023   AST 94 (H) 09/26/2023   ALT 41 09/26/2023   ANIONGAP 12 09/26/2023    CBG (last 3)  No results for input(s): "GLUCAP" in the last 72 hours.    Coagulation Profile: No results for input(s): "INR", "PROTIME" in the last 168  hours.   Radiology Studies: I have personally reviewed the imaging studies  No results found.     Barnetta Chapel M.D. [facility] 09/26/2023, 7:04 PM  Available via Epic secure chat 7am-7pm After 7 pm, please refer to night coverage provider listed on amion.

## 2023-09-27 DIAGNOSIS — G8220 Paraplegia, unspecified: Secondary | ICD-10-CM | POA: Diagnosis not present

## 2023-09-27 NOTE — Progress Notes (Signed)
Noted blood in urine and around insertion site. On call provider notified. Recommended flushing catheter no change noted after flushing.

## 2023-09-27 NOTE — Progress Notes (Signed)
MD was made aware of the pt's foley output. Bloody urine, no blood clot noted. MD has not further order.    09/27/23 1000  Urethral Catheter Valkyrie Guardiola Straight-tip 14 Fr.  Placement Date/Time: 09/23/23 1346   Inserted prior to hospital arrival?: No  Inserted prior to unit arrival?: No  Perineal care performed prior to insertion?: Yes  Person Inserting LDA: Dezaray Shibuya  Person Assisting with Catheter Insertion: Jyl Heinz...  Output (mL) 400 mL (blood in the urine, MD made aware)  Urine Characteristics  Urinary Incontinence No  Urine Color Red  Urine Appearance Cloudy  Hygiene Foley care AND Peri care  Stool Characteristics  Bowel Incontinence Yes (Enema given)  Stool Type Type 4 (Like a smooth, soft sausage or snake)  Has the patient had three Type 7 stools in the last 24 hours? No  Stool Descriptors Brown  Stool Amount Medium  Stool Source Rectum

## 2023-09-27 NOTE — Progress Notes (Signed)
[facility]                                                                              Austin Weaver, is a 77 y.o. male, DOB - [birth year], QQV:[id number] Admit date - 09/21/2023    Outpatient Primary MD for the patient is Mort Sawyers, FNP  LOS - 5  days  Chief Complaint  Patient presents with   Numbness       Brief summary  As per prior documentation "Patient is a 77 year old male with dementia, HTN, prior stroke, long-term nursing home resident at [facility] facility.  At baseline patient is able to ambulate.  The day prior to admission he noted that his legs were weak and gave out.  In ED was noted to be having urinary retention and diminished rectal tone. MRI lumbar spine showed 1. Large contrast-enhancing lesion involving the T8 vertebral body worrisome for malignancy -lymphoma versus metastasis. There is epidural spread of tumor resulting in severe spinal canal stenosis with compression of the spinal cord at the T8 level. 2. Additional smaller contrast-enhancing lesions in the T10, T12, and S1 vertebral bodies, concerning for additional sites of metastatic disease.  EDP spoke with neurosurgery on-call Dr. Danielle Dess.  Per neurosurgery, decompression is unlikely to be successful in this patient with his degree of varices and other comorbidities.  Steroids unlikely to be helpful.  Patient did not have any pain".   09/26/2023: Patient seen.  No significant history from patient.  No new changes. 09/27/2023: Slight blood noted in Foley bag.  Will flush the Foley catheter.  If hematuria persists, will have a low threshold to consult the urology team.  Otherwise, no new changes today.  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 Danielle Dess decompression unlikely to be successful given his age and comorbidities.  Steroids unlikely to be helpful. -Continue Foley catheter for neurogenic bladder -Met with patient's legal guardianship social worker at bedside yesterday on 12/19, see note  -Awaiting DSS response from the formal medication letter sent yesterday regarding how to proceed further.   -I had explained to the guardian ship social worker that it is unclear at this time if there is a large T8 vertebral body malignancy with severe spinal canal stenosis, epidural spread and lesions at T10, T12, S1 vertebral bodies is from primary spinal tumor versus metastasis of unknown primary.  Recommended IR guided biopsy for further diagnosis.  Based on results, will have oncology consultation for further management versus neurosurgery if it is primary spinal tumor.   -Palliative medicine also following for GOC to pursue further management  Dementia -Patient is alert and oriented to self and place.  No acute complaints, appears close to his baseline   Hypokalemia -Resolved   Hypertension -Continue home amlodipine and hydrochlorothiazide   Hyperlipidemia -Continues statin   Leukocytosis  -Mild, no signs or symptoms of infection.  -Continue  to monitor   Hematuria: -See above documentation.    Estimated body mass index is 27.12 kg/m as calculated from the following:   Height as of 05/18/23: 5' 4.5" (1.638 m).   Weight as of 05/18/23: 72.8 kg.  Code Status: Full code DVT Prophylaxis:  enoxaparin (LOVENOX) injection 40 mg Start: 09/21/23 2200   Level of Care: Level of care: Med-Surg Family Communication: Updated patient's legal guardianship social worker at the bedside on 12/19 Disposition Plan:      Remains inpatient appropriate:      Procedures:    Consultants:   Palliative medicine Neurosurgery  Antimicrobials:   Anti-infectives (From  admission, onward)    None          Medications  amLODipine  10 mg Oral Daily   atorvastatin  40 mg Oral QHS   Chlorhexidine Gluconate Cloth  6 each Topical Daily   enoxaparin (LOVENOX) injection  40 mg Subcutaneous Q24H   hydrochlorothiazide  12.5 mg Oral Daily   lactulose  300 mL Rectal QODAY      Subjective:   No significant history from patient.  Objective:   Vitals:   09/26/23 2259 09/27/23 0521 09/27/23 0731 09/27/23 1514  BP: 121/77 111/80 119/68 90/68  Pulse: 100 92 85 88  Resp: 16 18 18 18   Temp: (!) 100.9 F (38.3 C) 97.7 F (36.5 C) 98.6 F (37 C) 98.5 F (36.9 C)  TempSrc: Oral Oral    SpO2: 96% 97% 99% 99%    Intake/Output Summary (Last 24 hours) at 09/27/2023 1831 Last data filed at 09/27/2023 1728 Gross per 24 hour  Intake 990 ml  Output 1200 ml  Net -210 ml      Wt Readings from Last 3 Encounters:  05/18/23 72.8 kg  02/20/23 73.7 kg  07/11/22 78.2 kg    Physical Exam General: Chronically ill looking.  Not in any distress.  Awake and alert.   Cardiovascular: S1 S2   Respiratory: Clear to auscultation. Gastrointestinal: Soft and nontender. Neuro: flaccid paralysis of LE bilaterally  Data Reviewed:  I have personally reviewed following labs    CBC Lab Results  Component Value Date   WBC 9.5 09/26/2023   RBC 5.20 09/26/2023   HGB 15.5 09/26/2023   HCT 44.4 09/26/2023   MCV 85.4 09/26/2023   MCH 29.8 09/26/2023   PLT 225 09/26/2023   MCHC 34.9 09/26/2023   RDW 14.0 09/26/2023   LYMPHSABS 1.4 09/21/2023   MONOABS 1.0 09/21/2023   EOSABS 0.1 09/21/2023   BASOSABS 0.0 09/21/2023     Last metabolic panel Lab Results  Component Value Date   NA 135 09/26/2023   K 3.8 09/26/2023   CL 97 (L) 09/26/2023   CO2 26 09/26/2023   BUN 24 (H) 09/26/2023   CREATININE 1.24 09/26/2023   GLUCOSE 121 (H) 09/26/2023   GFRNONAA >60 09/26/2023   GFRAA >60 11/18/2018   CALCIUM 8.9 09/26/2023   PHOS 2.5 09/21/2018   PROT 7.0  09/26/2023   ALBUMIN 3.1 (L) 09/26/2023   BILITOT 1.0 09/26/2023   ALKPHOS 71 09/26/2023   AST 94 (H) 09/26/2023   ALT 41 09/26/2023   ANIONGAP 12 09/26/2023    CBG (last 3)  No results for input(s): "GLUCAP" in the last 72 hours.    Coagulation Profile: No results for input(s): "INR", "PROTIME" in the last 168 hours.   Radiology Studies: I have personally reviewed the imaging studies  No results found.     Laurine Blazer.D.  [facility] 09/27/2023, 6:31 PM  Available via Epic secure chat 7am-7pm After 7 pm, please refer to night coverage provider listed on amion.

## 2023-09-28 DIAGNOSIS — G8389 Other specified paralytic syndromes: Secondary | ICD-10-CM | POA: Diagnosis not present

## 2023-09-28 DIAGNOSIS — Z515 Encounter for palliative care: Secondary | ICD-10-CM | POA: Diagnosis not present

## 2023-09-28 DIAGNOSIS — F0390 Unspecified dementia without behavioral disturbance: Secondary | ICD-10-CM | POA: Diagnosis not present

## 2023-09-28 DIAGNOSIS — G8220 Paraplegia, unspecified: Secondary | ICD-10-CM | POA: Diagnosis not present

## 2023-09-28 NOTE — Plan of Care (Signed)
  Problem: Education: Goal: Knowledge of General Education information will improve Description: Including pain rating scale, medication(s)/side effects and non-pharmacologic comfort measures Outcome: Progressing   Problem: Clinical Measurements: Goal: Ability to maintain clinical measurements within normal limits will improve Outcome: Progressing Goal: Will remain free from infection Outcome: Progressing   Problem: Activity: Goal: Risk for activity intolerance will decrease Outcome: Progressing   Problem: Nutrition: Goal: Adequate nutrition will be maintained Outcome: Progressing   Problem: Coping: Goal: Level of anxiety will decrease Outcome: Progressing   Problem: Elimination: Goal: Will not experience complications related to bowel motility Outcome: Progressing Goal: Will not experience complications related to urinary retention Outcome: Progressing   Problem: Pain Management: Goal: General experience of comfort will improve Outcome: Progressing

## 2023-09-28 NOTE — Progress Notes (Signed)
PROGRESS NOTE  Austin Weaver  DOB: 1946/10/02  PCP: Mort Sawyers, FNP UVO:[id number]  DOA: 09/21/2023  LOS: 6 days  Hospital Day: 8  Brief narrative: Austin Weaver is a 77 y.o. male with PMH significant for dementia, HTN, stroke who is a long-term nursing home patient at [facility]. At baseline, patient was able to ambulate.   Per report, yesterday he walked to the bathroom and while getting back, he felt that his legs were shaky, weak and were going to give out.  He got back to the bed and since then he was not able to move his legs.  His legs remained weak overnight and today he became completely nonambulatory and.  He also could not feel any sensation in the legs and hence was brought to the ED. Denies any incontinence.   In the ED, patient was afebrile, hemodynamically stable On exam in the ED, patient was noted to have a sensory level at the umbilical level and complete flaccid paralysis below that level He was noted to have urinary retention of more than 600 mL, Foley catheter was inserted. EDP noted diminished rectal tone.   MRI lumbar spine showed 1. Large contrast-enhancing lesion involving the T8 vertebral body worrisome for malignancy -lymphoma versus metastasis. There is epidural spread of tumor resulting in severe spinal canal stenosis with compression of the spinal cord at the T8 level. 2. Additional smaller contrast-enhancing lesions in the T10, T12, and S1 vertebral bodies, concerning for additional sites of metastatic disease.   EDP spoke with neurosurgery on-call Dr. Danielle Dess.  Per neurosurgery, decompression is unlikely to be successful in this patient with his degree of varices and other comorbidities.  Steroids unlikely to be helpful.  Patient did not have any pain.  Admitted to [facility]  Subjective: Patient was seen and examined this morning. Sitting up in recliner.  Not in distress.  Pain partially controlled. Chart reviewed In the last 24 hours, no fever,  hemodynamically stable  Assessment and plan: Acute paraplegia Vertebral body mass -primary malignancy versus metastasis Presented with sudden onset bilateral lower extremity weakness starting yesterday Noted to have flaccid paraplegia, sensory level, urine retention MRI with large T8 vertebral body malignancy with epidural spread causing severe spinal canal stenosis and compression of the spinal cord Per neurosurgery, decompression unlikely to be successful given his age and comorbidities.  Steroids unlikely to be helpful No pain symptoms Foley catheter placed for neurogenic bladder Previous hospitalist discussed with legal guardian.  Palliative care consulted as well. Awaiting response from legal guardian on further course of action.  Hematuria 12/22, patient was noted to have mild bloating Foley bag.  Hematuria did not persist.  Dementia Alert and oriented to self and place   Hypertension PTA meds- .amlodipine 10 mg daily, HCTZ 12.5 mg daily Continue both   Hyperlipidemia Lipitor 40 mg daily aspirin   Mobility: Unable to move  Goals of care   Code Status: Full Code     DVT prophylaxis:  enoxaparin (LOVENOX) injection 40 mg Start: 09/21/23 2200   Antimicrobials: None Fluid: None Consultants: Palliative care Family Communication: None at bedside  Status: Inpatient Level of care:  Med-Surg   Patient is from: Long-term placement at [facility] Needs to continue in-hospital care: Pending response from legal guardian on how to proceed  Diet:  Diet Order             Diet regular Room service appropriate? Yes; Fluid consistency: Thin  Diet effective now  Scheduled Meds:  amLODipine  10 mg Oral Daily   atorvastatin  40 mg Oral QHS   Chlorhexidine Gluconate Cloth  6 each Topical Daily   enoxaparin (LOVENOX) injection  40 mg Subcutaneous Q24H   hydrochlorothiazide  12.5 mg Oral Daily   lactulose  300 mL Rectal QODAY    PRN  meds: acetaminophen **OR** acetaminophen, albuterol, bisacodyl, morphine injection, oxyCODONE, polyethylene glycol   Infusions:    Antimicrobials: Anti-infectives (From admission, onward)    None       Objective: Vitals:   09/28/23 1008 09/28/23 1354  BP: 117/72 118/66  Pulse: 84 83  Resp:  16  Temp:  98.6 F (37 C)  SpO2:  (!) 89%    Intake/Output Summary (Last 24 hours) at 09/28/2023 1427 Last data filed at 09/28/2023 1015 Gross per 24 hour  Intake 1260 ml  Output 750 ml  Net 510 ml   There were no vitals filed for this visit. Weight change:  There is no height or weight on file to calculate BMI.   Physical Exam: General exam: Pleasant, elderly African-American male.  Not in distress.  Partial controlled pain.  Wanted repositioning Skin: No rashes, lesions or ulcers. HEENT: Atraumatic, normocephalic, no obvious bleeding Lungs: Clear to auscultation bilaterally,  CVS: S1, S2, no murmur,   GI/Abd: Soft, nontender, nondistended, bowel sound present,   CNS: Alert, awake, oriented to place Psychiatry: Mood appropriate Extremities: No pedal edema, no calf tenderness,   Data Review: I have personally reviewed the laboratory data and studies available.  F/u labs ordered Unresulted Labs (From admission, onward)     Start     Ordered   09/29/23 0500  Basic metabolic panel  Tomorrow morning,   R        09/28/23 1427   09/29/23 0500  CBC with Differential/Platelet  Tomorrow morning,   R        09/28/23 1427            Admission date and time: 09/21/2023 11:14 AM   Total time spent in review of labs and imaging, patient evaluation, formulation of plan, documentation and communication with family: 45 minutes  Signed, Lorin Glass, MD [facility] 09/28/2023

## 2023-09-28 NOTE — Progress Notes (Signed)
Physical Therapy Treatment  Patient Details Name: Austin Weaver MRN: [id number] DOB: Aug 03, 1946 Today's Date: 09/28/2023   History of Present Illness Pt is a 77 y/o male who presents 09/22/2023 with LE weakness, numbness, and inability to walk. Pt is from [facility] nursing facility where he is a long term resident. MRI revealed T8 vertebral body lesion concerning for malignancy - lymphoma vs metastasis, with additional smaller lesions at T10, T12, S1 vertebral bodies. Neurosurgery consulted, pt is not a surgical candidate. PMH significant for CVA/TIA, HTN, laporotomy.    PT Comments  Maxi Move lift utilized for transition bed>chair today. Pt continues to demonstrate no active movement in LE's. He reports zero sensation in his LE's to light touch or deep pressure. Pain reported in back around R scapula. Noted increased R lateral lean in sitting this date. Repositioned hips and trunk in chair however again with R lateral lean. Pillow utilized behind R trunk for comfort and maintaining truncal alignment. Will continue to follow and progress as able per POC.    If plan is discharge home, recommend the following: Two people to help with walking and/or transfers;Two people to help with bathing/dressing/bathroom;Direct supervision/assist for medications management;Direct supervision/assist for financial management;Assist for transportation;Help with stairs or ramp for entrance;Supervision due to cognitive status   Can travel by private vehicle     No  Equipment Recommendations  Wheelchair (measurements PT);Wheelchair cushion (measurements PT)    Recommendations for Other Services       Precautions / Restrictions Precautions Precautions: Fall Precaution Comments: back for comfort Restrictions Weight Bearing Restrictions Per Provider Order: No     Mobility  Bed Mobility Overal bed mobility: Needs Assistance Bed Mobility: Rolling Rolling: Max assist, +2 for physical assistance          General bed mobility comments: Pt able to use BUEs to reach for rails during rolling activity. Lift pad placed in supine prior to lift to chair. Pt required total assist for LE movement/positioning. No active movement noted.    Transfers Overall transfer level: Needs assistance Equipment used: Ambulation equipment used Transfers: Bed to chair/wheelchair/BSC             General transfer comment: Maximove lift from bed>chair. Pt grimacing with tilting forward into sitting position within sling. When asked, reports no pain however. Transfer via Lift Equipment: Maximove  Ambulation/Gait               General Gait Details: Unable at this time   Comptroller Bed    Modified Rankin (Stroke Patients Only)       Balance Overall balance assessment: Needs assistance Sitting-balance support: No upper extremity supported, Feet supported Sitting balance-Leahy Scale: Poor   Postural control: Right lateral lean                                  Cognition Arousal: Alert Behavior During Therapy: WFL for tasks assessed/performed Overall Cognitive Status: Impaired/Different from baseline Area of Impairment: Orientation, Following commands, Memory, Safety/judgement, Awareness, Problem solving, Attention                 Orientation Level: Disoriented to, Time (place not assessed) Current Attention Level: Sustained Memory: Decreased recall of precautions, Decreased short-term memory Following Commands: Follows one step commands consistently, Follows one step commands with increased time, Follows multi-step commands inconsistently  Safety/Judgement: Decreased awareness of deficits, Decreased awareness of safety Awareness: Intellectual Problem Solving: Slow processing, Decreased initiation, Difficulty sequencing, Requires verbal cues, Requires tactile cues          Exercises      General Comments         Pertinent Vitals/Pain Pain Assessment Pain Assessment: Faces Faces Pain Scale: Hurts a little bit Pain Location: Pt reports R side of back around R scapula. Pain Descriptors / Indicators: Discomfort, Grimacing, Guarding Pain Intervention(s): Limited activity within patient's tolerance, Monitored during session, Repositioned    Home Living                          Prior Function            PT Goals (current goals can now be found in the care plan section) Acute Rehab PT Goals Patient Stated Goal: Get better PT Goal Formulation: With patient Time For Goal Achievement: 10/08/23 Potential to Achieve Goals: Good Progress towards PT goals: Progressing toward goals    Frequency    Min 1X/week      PT Plan      Co-evaluation              AM-PAC PT "6 Clicks" Mobility   Outcome Measure  Help needed turning from your back to your side while in a flat bed without using bedrails?: Total Help needed moving from lying on your back to sitting on the side of a flat bed without using bedrails?: Total Help needed moving to and from a bed to a chair (including a wheelchair)?: Total Help needed standing up from a chair using your arms (e.g., wheelchair or bedside chair)?: Total Help needed to walk in hospital room?: Total Help needed climbing 3-5 steps with a railing? : Total 6 Click Score: 6    End of Session Equipment Utilized During Treatment: Gait belt Activity Tolerance: Patient tolerated treatment well Patient left: in chair;with call bell/phone within reach;with chair alarm set Nurse Communication: Mobility status;Need for lift equipment PT Visit Diagnosis: Muscle weakness (generalized) (M62.81);Other symptoms and signs involving the nervous system (R29.898);Difficulty in walking, not elsewhere classified (R26.2);Pain Pain - part of body:  (back)     Time: 1610-9604 PT Time Calculation (min) (ACUTE ONLY): 22 min  Charges:    $Therapeutic Activity:  8-22 mins PT General Charges $$ ACUTE PT VISIT: 1 Visit                     Conni Slipper, PT, DPT Acute Rehabilitation Services Secure Chat Preferred Office: [phone]    Austin Weaver 09/28/2023, 11:50 AM

## 2023-09-28 NOTE — Progress Notes (Addendum)
Patient ID: Austin Weaver, male   DOB: Jan 23, 1946, 77 y.o.   MRN: [id number]    Progress Note from the Palliative Medicine Team at [facility]   Patient Name: Austin Weaver        Date: 09/28/2023 DOB: [birth year]  Age: 77 y.o. MRN#: [id number] Attending Physician: Lorin Glass, MD Primary Care Physician: Mort Sawyers, FNP Admit Date: 09/21/2023   Reason for Consultation/Follow-up   Establishing Goals of Care   HPI/ Brief Hospital Review  Austin Weaver is a 77 y.o. male with PMH significant for dementia, HTN, stroke who is a long-term nursing home patient at [facility].   According to past HPI reports patient was able to ambulate at baseline.  Prior to this admission history reports he walked to the bathroom and while getting back, he felt that his legs were shaky, weak and were going to give out.   He got back to the bed and since then he was not able to move his legs.     In the ED, patient was afebrile, hemodynamically stable On exam in the ED, patient was noted to have a sensory level at the umbilical level and complete flaccid paralysis below that level He was noted to have urinary retention of more than 600 mL, Foley catheter was inserted. EDP noted diminished rectal tone.   MRI lumbar spine showed 1. Large contrast-enhancing lesion involving the T8 vertebral body worrisome for malignancy -lymphoma versus metastasis. There is epidural spread of tumor resulting in severe spinal canal stenosis with compression of the spinal cord at the T8 level. 2. Additional smaller contrast-enhancing lesions in the T10, T12, and S1 vertebral bodies, concerning for additional sites of metastatic disease.    Per neurosurgery, decompression is unlikely to be successful in this patient with his degree of varices and other comorbidities.  Steroids unlikely to be helpful.  Patient did not have any pain.  Patient has a legal guardian with [facility] DSS  Pending decisions  regarding treatment option decisions, advanced directive decisions and anticipatory care needs.   Subjective  Extensive chart review has been completed prior to meeting with patient/family  including labs, vital signs, imaging, progress/consult notes, orders, medications and available advance directive documents.   This NP assessed patient at the bedside as a follow up for palliative medicine needs and emotional support  Patient does have a documented medical power of attorney in epic naming his son Austin Weaver as H POA.  I attempted to contact however listed phone number is no longer in service.  DSS tells me they have been unable to get in touch with son also  Spoke to Legal Guardian/ Austin Weaver by telephone  I was then given several other numbers of Austin Weaver # [phone], in order to get in touch with the Austin Weaver [phone]        Detailed education offered to all members of DSS that I spoke to today the importance of establishing goals of care.  It will be important to look at overall big picture as decisions are being made for treatment plan moving forward.  DSS workers are focused on decision regarding biopsy and again I tried to reinforce the importance of the overall big picture, value of biopsy as it relates to viable treatment options into the future.  Plan of Care: -Full Code       -Educated DSS to consider DNR/DNI status understanding evidenced based poor outcomes in similar hospitalized patient, as the cause of arrest is likely associated with  advanced chronic illness rather than an easily reversible acute cardio-pulmonary event. -Again DSS tells me they are open to all offered and available medical interventions to prolong life -Strongly recommended follow-up goals of care meeting with all decision making parties, hopefully in person, with members of the  treatment team ensuring clear communication and valuable information.   -PMT  continue to support  holistically   DSS/Austin Weaver did return my call much later in the day.  Decision at this time is to move forward with all offered and available medical interventions to prolong life.  They speak specifically to the idea of a "biopsy" they feel the need for more information before making any other advance care planning decisions.  Education offered today regarding  the importance of continued conversation medical providers regarding overall plan of care and treatment options,  ensuring decisions are within the context of the patients values and GOCs.  Questions and concerns addressed   Discussed with primary team and nursing staff  Time: 65  minutes  Detailed review of medical records ( labs, imaging, vital signs), medically appropriate exam ( MS, skin, cardiac,  resp)   discussed with treatment team, counseling and education to patient, family, staff, documenting clinical information, medication management, coordination of care    Austin Creed NP  Palliative Medicine Team Team Phone # [phone] Pager [phone]

## 2023-09-28 NOTE — Progress Notes (Signed)
Mobility Specialist: Progress Note   09/28/23 1630  Mobility  Activity Transferred from chair to bed  Level of Assistance +2 (takes two people)  Assistive Device MaxiMove  Activity Response Tolerated well  Mobility Referral Yes  Mobility visit 1 Mobility  Mobility Specialist Start Time (ACUTE ONLY) 1536  Mobility Specialist Stop Time (ACUTE ONLY) 1552  Mobility Specialist Time Calculation (min) (ACUTE ONLY) 16 min    Pt requested to be moved back to bed - received in chair. TotalA+2 via Maximove. Left in bed with all needs met, call bell in reach.   Maurene Capes Mobility Specialist Please contact via SecureChat or Rehab office at [phone]

## 2023-09-29 DIAGNOSIS — G8220 Paraplegia, unspecified: Secondary | ICD-10-CM | POA: Diagnosis not present

## 2023-09-29 LAB — CBC WITH DIFFERENTIAL/PLATELET
Abs Immature Granulocytes: 0.09 10*3/uL — ABNORMAL HIGH (ref 0.00–0.07)
Basophils Absolute: 0.1 10*3/uL (ref 0.0–0.1)
Basophils Relative: 0 %
Eosinophils Absolute: 0.1 10*3/uL (ref 0.0–0.5)
Eosinophils Relative: 1 %
HCT: 42.8 % (ref 39.0–52.0)
Hemoglobin: 14.9 g/dL (ref 13.0–17.0)
Immature Granulocytes: 1 %
Lymphocytes Relative: 13 %
Lymphs Abs: 1.5 10*3/uL (ref 0.7–4.0)
MCH: 29.9 pg (ref 26.0–34.0)
MCHC: 34.8 g/dL (ref 30.0–36.0)
MCV: 85.9 fL (ref 80.0–100.0)
Monocytes Absolute: 1.2 10*3/uL — ABNORMAL HIGH (ref 0.1–1.0)
Monocytes Relative: 10 %
Neutro Abs: 9 10*3/uL — ABNORMAL HIGH (ref 1.7–7.7)
Neutrophils Relative %: 75 %
Platelets: 199 10*3/uL (ref 150–400)
RBC: 4.98 MIL/uL (ref 4.22–5.81)
RDW: 14.3 % (ref 11.5–15.5)
WBC: 11.9 10*3/uL — ABNORMAL HIGH (ref 4.0–10.5)
nRBC: 0 % (ref 0.0–0.2)

## 2023-09-29 LAB — BASIC METABOLIC PANEL
Anion gap: 12 (ref 5–15)
BUN: 49 mg/dL — ABNORMAL HIGH (ref 8–23)
CO2: 27 mmol/L (ref 22–32)
Calcium: 8.9 mg/dL (ref 8.9–10.3)
Chloride: 92 mmol/L — ABNORMAL LOW (ref 98–111)
Creatinine, Ser: 1.35 mg/dL — ABNORMAL HIGH (ref 0.61–1.24)
GFR, Estimated: 55 mL/min — ABNORMAL LOW (ref >60)
Glucose, Bld: 118 mg/dL — ABNORMAL HIGH (ref 70–99)
Potassium: 4.2 mmol/L (ref 3.5–5.1)
Sodium: 131 mmol/L — ABNORMAL LOW (ref 135–145)

## 2023-09-29 MED ORDER — ACETAMINOPHEN 500 MG PO TABS
1000.0000 mg | ORAL_TABLET | Freq: Three times a day (TID) | ORAL | Status: DC
  Administered 2023-09-29 – 2023-10-08 (×25): 1000 mg via ORAL
  Filled 2023-09-29 (×25): qty 2

## 2023-09-29 NOTE — Progress Notes (Addendum)
PROGRESS NOTE  Austin Weaver  DOB: 08-30-1946  PCP: Mort Sawyers, FNP ZOX:[id number]  DOA: 09/21/2023  LOS: 7 days  Hospital Day: 9  Brief narrative: Austin Weaver is a 77 y.o. male with PMH significant for dementia, HTN, stroke who is a long-term nursing home patient at [facility]. At baseline, patient was able to ambulate.   Per report, yesterday he walked to the bathroom and while getting back, he felt that his legs were shaky, weak and were going to give out.  He got back to the bed and since then he was not able to move his legs.  His legs remained weak overnight and today he became completely nonambulatory and.  He also could not feel any sensation in the legs and hence was brought to the ED. Denies any incontinence.   In the ED, patient was afebrile, hemodynamically stable On exam in the ED, patient was noted to have a sensory level at the umbilical level and complete flaccid paralysis below that level He was noted to have urinary retention of more than 600 mL, Foley catheter was inserted. EDP noted diminished rectal tone.   MRI lumbar spine showed 1. Large contrast-enhancing lesion involving the T8 vertebral body worrisome for malignancy -lymphoma versus metastasis. There is epidural spread of tumor resulting in severe spinal canal stenosis with compression of the spinal cord at the T8 level. 2. Additional smaller contrast-enhancing lesions in the T10, T12, and S1 vertebral bodies, concerning for additional sites of metastatic disease.   EDP spoke with neurosurgery on-call Dr. Danielle Dess.  Per neurosurgery, decompression is unlikely to be successful in this patient with his degree of varices and other comorbidities.  Steroids unlikely to be helpful.  Patient did not have any pain.  Admitted to [facility]  Subjective: Patient was seen and examined this morning. Lying on bed.  Opens eyes on command.  Able to make few words but not oriented to place or person or time.  Not restless  or agitated. Remains paraplegic.  Assessment and plan: Acute paraplegia Vertebral body mass -primary malignancy versus metastasis Presented with sudden onset bilateral lower extremity weakness starting yesterday Noted to have flaccid paraplegia, sensory level, urine retention MRI with large T8 vertebral body malignancy with epidural spread causing severe spinal canal stenosis and compression of the spinal cord Per neurosurgery, decompression unlikely to be successful given his age and comorbidities.  Steroids unlikely to be helpful No pain symptoms Foley catheter was placed in the ED for neurogenic bladder Previous hospitalist discussed with legal guardian.  Palliative care consulted as well. Discussed with palliative care NP [facility] this morning.  She had received a call from DSS.  And apparently wanted to go ahead with biopsy.  Patient is a frail elderly with dementia, currently not oriented to time, place or person. Neurosurgery has already told that he is not a candidate for any surgical procedure.  I do not think it is medically appropriate at this time to subject him to surgical biopsy.  Even if the biopsy is done, with the result in hand, he is not a candidate for any chemo or radiation or surgery.  In my medical opinion, he is appropriate for palliative and hospice care. I called and left a message twice to the legal guardian Candace Cruise as well as her supervisor Ms. Jay Schlichter.  Awaiting callback.  Hematuria 12/22, patient was noted to have mild bloating Foley bag.  Hematuria did not persist.  Dementia Alert and oriented to self and place  Hypertension PTA meds- amlodipine 10 mg daily, HCTZ 12.5 mg daily Continue both   Hyperlipidemia Lipitor 40 mg daily aspirin   Mobility: Unable to move  Goals of care   Code Status: Full Code     DVT prophylaxis:  enoxaparin (LOVENOX) injection 40 mg Start: 09/21/23 2200   Antimicrobials: None Fluid: None Consultants:  Palliative care Family Communication: None at bedside  Status: Inpatient Level of care:  Med-Surg   Patient is from: Long-term placement at [facility] Needs to continue in-hospital care: Pending response from legal guardian on how to proceed  Diet:  Diet Order             Diet regular Room service appropriate? Yes; Fluid consistency: Thin  Diet effective now                   Scheduled Meds:  acetaminophen  1,000 mg Oral TID   amLODipine  10 mg Oral Daily   atorvastatin  40 mg Oral QHS   Chlorhexidine Gluconate Cloth  6 each Topical Daily   enoxaparin (LOVENOX) injection  40 mg Subcutaneous Q24H   hydrochlorothiazide  12.5 mg Oral Daily   lactulose  300 mL Rectal QODAY    PRN meds: albuterol, bisacodyl, morphine injection, oxyCODONE, polyethylene glycol   Infusions:    Antimicrobials: Anti-infectives (From admission, onward)    None       Objective: Vitals:   09/29/23 0424 09/29/23 0726  BP: 108/71 95/75  Pulse: 81 87  Resp: 18 16  Temp: (!) 97.5 F (36.4 C) 98.4 F (36.9 C)  SpO2: 98% 100%    Intake/Output Summary (Last 24 hours) at 09/29/2023 1306 Last data filed at 09/29/2023 0554 Gross per 24 hour  Intake --  Output 875 ml  Net -875 ml   There were no vitals filed for this visit. Weight change:  There is no height or weight on file to calculate BMI.   Physical Exam: General exam: Pleasant, elderly African-American male.  Not in distress.  Pain controlled.   Skin: No rashes, lesions or ulcers. HEENT: Atraumatic, normocephalic, no obvious bleeding Lungs: Clear to auscultation bilaterally,  CVS: S1, S2, no murmur,   GI/Abd: Soft, nontender, nondistended, bowel sound present,   CNS: Alert, awake, not to place person or time.  Remains paraplegic. Psychiatry: Mood appropriate Extremities: No pedal edema, no calf tenderness,   Data Review: I have personally reviewed the laboratory data and studies available.  F/u labs ordered Unresulted  Labs (From admission, onward)    None      Total time spent in review of labs and imaging, patient evaluation, formulation of plan, documentation and communication with family: 45 minutes  Signed, Lorin Glass, MD [facility] 09/29/2023

## 2023-09-29 NOTE — Plan of Care (Signed)

## 2023-09-30 DIAGNOSIS — G8220 Paraplegia, unspecified: Secondary | ICD-10-CM | POA: Diagnosis not present

## 2023-09-30 NOTE — Progress Notes (Signed)
PROGRESS NOTE  Austin Weaver  DOB: [birth year]  PCP: Mort Sawyers, FNP WUJ:[id number]  DOA: 09/21/2023  LOS: 8 days  Hospital Day: 10  Brief narrative: Austin Weaver is a 77 y.o. male with PMH significant for dementia, HTN, stroke who is a long-term nursing home patient at [facility]. At baseline, patient was able to ambulate.   Per report, yesterday he walked to the bathroom and while getting back, he felt that his legs were shaky, weak and were going to give out.  He got back to the bed and since then he was not able to move his legs.  His legs remained weak overnight and today he became completely nonambulatory and.  He also could not feel any sensation in the legs and hence was brought to the ED. Denies any incontinence.   In the ED, patient was afebrile, hemodynamically stable On exam in the ED, patient was noted to have a sensory level at the umbilical level and complete flaccid paralysis below that level He was noted to have urinary retention of more than 600 mL, Foley catheter was inserted. EDP noted diminished rectal tone.   MRI lumbar spine showed 1. Large contrast-enhancing lesion involving the T8 vertebral body worrisome for malignancy -lymphoma versus metastasis. There is epidural spread of tumor resulting in severe spinal canal stenosis with compression of the spinal cord at the T8 level. 2. Additional smaller contrast-enhancing lesions in the T10, T12, and S1 vertebral bodies, concerning for additional sites of metastatic disease.   EDP spoke with neurosurgery on-call Dr. Danielle Dess.  Per neurosurgery, decompression is unlikely to be successful in this patient with his degree of varices and other comorbidities.  Steroids unlikely to be helpful.  Patient did not have any pain.  Admitted to [facility]  Subjective: Patient was seen and examined this morning. Lying on bed.  Not in distress.  Opens eyes on command.  Knows he is at [facility] but took him a while to answer that.   Not oriented to time or person.  Remains paraplegic.  Denies any pain.  Assessment and plan: Acute paraplegia Vertebral body mass -primary malignancy versus metastasis Presented with sudden onset bilateral lower extremity weakness starting yesterday Noted to have flaccid paraplegia, sensory level, urine retention MRI with large T8 vertebral body malignancy with epidural spread causing severe spinal canal stenosis and compression of the spinal cord Per neurosurgery, decompression unlikely to be successful given his age and comorbidities.  Steroids unlikely to be helpful No pain symptoms Foley catheter was placed in the ED for neurogenic bladder Previous hospitalist discussed with legal guardian.  Palliative care consulted as well. 12/24, discussed with palliative care NP [facility]. She had received a call from DSS.  And apparently DSS representative wanted to go ahead with biopsy.  Patient is a frail elderly with dementia, currently not oriented to time, place or person. Neurosurgery has already told that he is not a candidate for any surgical procedure.  I do not think it is medically appropriate at this time to subject him to surgical biopsy.  Even if the biopsy is done, with the result in hand, he is not a candidate for any chemo or radiation or surgery.  In my medical opinion, he is appropriate for palliative and hospice care. On 12/24, I called and left a message twice to the legal guardian Candace Cruise as well as her supervisor Ms. Jay Schlichter.  I left my cell phone number. Awaiting callback.  Hematuria 12/22, patient was noted to have mild  bloating Foley bag.  Hematuria did not persist.  Dementia Alert and oriented to self and place   Hypertension PTA meds- amlodipine 10 mg daily, HCTZ 12.5 mg daily Continue both   Hyperlipidemia Lipitor 40 mg daily aspirin   Mobility: Unable to move  Goals of care   Code Status: Full Code     DVT prophylaxis:  enoxaparin (LOVENOX)  injection 40 mg Start: 09/21/23 2200   Antimicrobials: None Fluid: None Consultants: Palliative care Family Communication: None at bedside  Status: Inpatient Level of care:  Med-Surg   Patient is from: Long-term placement at [facility] Needs to continue in-hospital care: Pending response from legal guardian on how to proceed  Diet:  Diet Order             Diet regular Room service appropriate? Yes; Fluid consistency: Thin  Diet effective now                   Scheduled Meds:  acetaminophen  1,000 mg Oral TID   amLODipine  10 mg Oral Daily   atorvastatin  40 mg Oral QHS   Chlorhexidine Gluconate Cloth  6 each Topical Daily   enoxaparin (LOVENOX) injection  40 mg Subcutaneous Q24H   hydrochlorothiazide  12.5 mg Oral Daily   lactulose  300 mL Rectal QODAY    PRN meds: albuterol, bisacodyl, morphine injection, oxyCODONE, polyethylene glycol   Infusions:    Antimicrobials: Anti-infectives (From admission, onward)    None       Objective: Vitals:   09/30/23 0459 09/30/23 0716  BP: 128/76 117/75  Pulse: 81 87  Resp: 14 17  Temp: 98 F (36.7 C) 98 F (36.7 C)  SpO2: 100% 99%    Intake/Output Summary (Last 24 hours) at 09/30/2023 1146 Last data filed at 09/30/2023 0900 Gross per 24 hour  Intake 120 ml  Output --  Net 120 ml   There were no vitals filed for this visit. Weight change:  There is no height or weight on file to calculate BMI.   Physical Exam: General exam: Pleasant, elderly African-American male.  Not in distress.  Pain controlled.   Skin: No rashes, lesions or ulcers. HEENT: Atraumatic, normocephalic, no obvious bleeding Lungs: Clear to auscultation bilaterally,  CVS: S1, S2, no murmur,   GI/Abd: Soft, nontender, nondistended, bowel sound present,   CNS: Alert, awake, oriented to place only.  Remains paraplegic  psychiatry: Mood appropriate Extremities: No pedal edema, no calf tenderness,   Data Review: I have personally reviewed  the laboratory data and studies available.  F/u labs ordered Unresulted Labs (From admission, onward)    None      Total time spent in review of labs and imaging, patient evaluation, formulation of plan, documentation and communication with family: 25 minutes  Signed, Lorin Glass, MD [facility] 09/30/2023

## 2023-09-30 NOTE — Plan of Care (Signed)

## 2023-09-30 NOTE — Plan of Care (Signed)
  Problem: Education: Goal: Knowledge of General Education information will improve Description: Including pain rating scale, medication(s)/side effects and non-pharmacologic comfort measures Outcome: Progressing   Problem: Clinical Measurements: Goal: Ability to maintain clinical measurements within normal limits will improve Outcome: Progressing Goal: Will remain free from infection Outcome: Progressing Goal: Diagnostic test results will improve Outcome: Progressing Goal: Respiratory complications will improve Outcome: Progressing   Problem: Activity: Goal: Risk for activity intolerance will decrease Outcome: Progressing   Problem: Nutrition: Goal: Adequate nutrition will be maintained Outcome: Progressing   Problem: Coping: Goal: Level of anxiety will decrease Outcome: Progressing

## 2023-10-01 DIAGNOSIS — G8220 Paraplegia, unspecified: Secondary | ICD-10-CM | POA: Diagnosis not present

## 2023-10-01 LAB — URINALYSIS, ROUTINE W REFLEX MICROSCOPIC

## 2023-10-01 LAB — URINALYSIS, MICROSCOPIC (REFLEX)
RBC / HPF: >50 RBC/hpf (ref 0–5)
WBC, UA: >50 WBC/hpf (ref 0–5)

## 2023-10-01 NOTE — Progress Notes (Addendum)
Per MD request, MD progress note faxed to [facility] [employer] legal guardian fax #[phone]. Daleen Squibb, MSW, LCSW 12/26/20241:27 PM   1330: confirmation received that fax went through.

## 2023-10-01 NOTE — Progress Notes (Signed)
Pt urine appears malodorous, tea-colored, cloudy, and has a mucous or pus-like consistency. Foley catheter cannot be exchanged, and the foreskin is non-retractable, though this is not a new issue.UA collected, sent to lab. Dahal MD notified.

## 2023-10-01 NOTE — Plan of Care (Signed)

## 2023-10-01 NOTE — Progress Notes (Signed)
I called again today and discussed with patient's legal guardian Ms. Austin Weaver. I mentioned to her about patient's comorbidities, frail clinical condition and my medical opinion that he would not benefit from biopsy as he is not going to be a candidate for any surgery or given his frailty.  I strongly believe the patient should be at least DNR/DNI.  He may even be appropriate for hospice.  Ms Austin Weaver states that she was not made aware that patient is unlikely to be a candidate for cancer care.  Hence her department department had given consent for biopsy earlier in the week. She wanted me to fax my progress note to the fax number: [phone] for her supervisors to review and consider a DNR/DNI. I have discussed this with case management.

## 2023-10-01 NOTE — Progress Notes (Addendum)
PROGRESS NOTE  Austin Weaver  DOB: 1946-01-04  PCP: Mort Sawyers, FNP VZD:[id number]  DOA: 09/21/2023  LOS: 9 days  Hospital Day: 11  Brief narrative: Austin Weaver is a 77 y.o. male with PMH significant for dementia, HTN, stroke who is a long-term nursing home patient at [facility]. At baseline, patient was able to ambulate.   Per report, yesterday he walked to the bathroom and while getting back, he felt that his legs were shaky, weak and were going to give out.  He got back to the bed and since then he was not able to move his legs.  His legs remained weak overnight and today he became completely nonambulatory and.  He also could not feel any sensation in the legs and hence was brought to the ED. Denies any incontinence.   In the ED, patient was afebrile, hemodynamically stable On exam in the ED, patient was noted to have a sensory level at the umbilical level and complete flaccid paralysis below that level He was noted to have urinary retention of more than 600 mL, Foley catheter was inserted. EDP noted diminished rectal tone.   MRI lumbar spine showed 1. Large contrast-enhancing lesion involving the T8 vertebral body worrisome for malignancy -lymphoma versus metastasis. There is epidural spread of tumor resulting in severe spinal canal stenosis with compression of the spinal cord at the T8 level. 2. Additional smaller contrast-enhancing lesions in the T10, T12, and S1 vertebral bodies, concerning for additional sites of metastatic disease.   EDP spoke with neurosurgery on-call Dr. Danielle Dess.  Per neurosurgery, decompression is unlikely to be successful in this patient with his degree of varices and other comorbidities.  Steroids unlikely to be helpful.  Patient did not have any pain.  Admitted to [facility]  Subjective: Patient was seen and examined this morning. Lying in bed.  Knows he is in the hospital.  Slow to respond.  Unable to tell me any medical details.  Assessment and  plan: Acute paraplegia Vertebral body mass -primary malignancy versus metastasis Presented with sudden onset bilateral lower extremity weakness starting yesterday Noted to have flaccid paraplegia, sensory level, urine retention MRI with large T8 vertebral body malignancy with epidural spread causing severe spinal canal stenosis and compression of the spinal cord Per neurosurgery, decompression unlikely to be successful given his age and comorbidities.  Steroids unlikely to be helpful No pain symptoms Foley catheter was placed in the ED for neurogenic bladder Previous hospitalist discussed with legal guardian.  Palliative care consulted as well. 12/24, discussed with palliative care NP [facility]. She had received a call from DSS.  And apparently DSS representative wanted to go ahead with biopsy.  Patient is a frail elderly with dementia, currently in delirium with inconsistent orientation. He has not been able to move his lower extremities at all since presentation.  Neurosurgery was called from the ED.  They stated that he is not a candidate for any surgical procedure. I do not think it is medically appropriate at this time to subject him to surgical biopsy.  Even if the biopsy is done, with the result in hand, he is not a candidate for any chemo or radiation or surgery.  In my medical opinion, he is appropriate for at least DNR/DNI.  He may also qualify for hospice care. I do not think biopsy at this time will add any meaningful benefit to the patient. I have discussed this with patient's legal guardian Ms. Woodard today.    Foul-smelling urine  Urine is cloudy  and foul-smelling today.  Foley catheter in place since urinary retention in ED. RN, unable to change Foley catheter today.  I have advised her to flush the catheter often to prevent obstruction.  Send urinalysis.  No fever or leukocytosis.  Dementia Alert and oriented to self and place   Hypertension PTA meds- amlodipine 10 mg daily,  HCTZ 12.5 mg daily Continue both   Hyperlipidemia Lipitor 40 mg daily aspirin   Mobility: Unable to move  Goals of care   Code Status: Full Code     DVT prophylaxis:  enoxaparin (LOVENOX) injection 40 mg Start: 09/21/23 2200   Antimicrobials: None Fluid: None Consultants: Palliative care Family Communication: None at bedside  Status: Inpatient Level of care:  Med-Surg   Patient is from: Long-term placement at [facility] Needs to continue in-hospital care: Pending response from legal guardian on how to proceed  Diet:  Diet Order             Diet regular Room service appropriate? Yes; Fluid consistency: Thin  Diet effective now                   Scheduled Meds:  acetaminophen  1,000 mg Oral TID   amLODipine  10 mg Oral Daily   atorvastatin  40 mg Oral QHS   Chlorhexidine Gluconate Cloth  6 each Topical Daily   enoxaparin (LOVENOX) injection  40 mg Subcutaneous Q24H   hydrochlorothiazide  12.5 mg Oral Daily   lactulose  300 mL Rectal QODAY    PRN meds: albuterol, bisacodyl, morphine injection, oxyCODONE, polyethylene glycol   Infusions:    Antimicrobials: Anti-infectives (From admission, onward)    None       Objective: Vitals:   10/01/23 0442 10/01/23 0719  BP: 117/62 107/66  Pulse: 94 90  Resp: 17 17  Temp: 98 F (36.7 C) 98 F (36.7 C)  SpO2: 100% 99%    Intake/Output Summary (Last 24 hours) at 10/01/2023 1322 Last data filed at 09/30/2023 1700 Gross per 24 hour  Intake 120 ml  Output --  Net 120 ml   There were no vitals filed for this visit. Weight change:  There is no height or weight on file to calculate BMI.   Physical Exam: General exam: Pleasant, elderly African-American male.  Not in distress.  Pain controlled.   Skin: No rashes, lesions or ulcers. HEENT: Atraumatic, normocephalic, no obvious bleeding Lungs: Clear to auscultation bilaterally,  CVS: S1, S2, no murmur,   GI/Abd: Soft, nontender, nondistended, bowel  sound present,   CNS: Alert, awake, oriented to place only.  Remains paraplegic  psychiatry: Mood appropriate Extremities: No pedal edema, no calf tenderness,   Data Review: I have personally reviewed the laboratory data and studies available.  F/u labs ordered Unresulted Labs (From admission, onward)     Start     Ordered   10/02/23 0500  Basic metabolic panel  Tomorrow morning,   R       Question:  Specimen collection method  Answer:  Lab=Lab collect   10/01/23 0820   10/02/23 0500  CBC with Differential/Platelet  Tomorrow morning,   R       Question:  Specimen collection method  Answer:  Lab=Lab collect   10/01/23 0820           Total time spent in review of labs and imaging, patient evaluation, formulation of plan, documentation and communication with family: 45 minutes  Signed, Lorin Glass, MD [facility] 10/01/2023

## 2023-10-01 NOTE — [place] FL2 (Signed)
[place] MEDICAID FL2 LEVEL OF CARE FORM     IDENTIFICATION  Patient Name: Austin Weaver Birthdate: 06-09-1946 Sex: male Admission Date (Current Location): 09/21/2023  [place] and [place] Number:  Haynes Bast [id number] Q Facility and Address:  [facility], [address], [place], [place] [address]      Provider Number: [id number]  Attending Physician Name and Address:  Lorin Glass, MD  Relative Name and Phone Number:  Candace Cruise Legal Guardian   [phone]    Current Level of Care: Hospital Recommended Level of Care: Skilled Nursing Facility Prior Approval Number:    Date Approved/Denied:   PASRR Number: [id number]  Discharge Plan: SNF    Current Diagnoses: Patient Active Problem List   Diagnosis Date Noted   Acute paraplegia (HCC) 09/21/2023   Sepsis (HCC) 05/17/2023   Unable to care for self 02/20/2023   Vision changes 02/20/2023   Screening for tuberculosis 02/20/2023   Left carotid bruit 02/20/2023   Decreased pulses in feet 02/20/2023   Pedal edema 02/20/2023   Microalbuminuria 02/20/2023   Onychomycosis 05/28/2022   Nicotine dependence, cigarettes, uncomplicated 10/06/2021   Left-sided weakness 05/01/2021   Lack of access to transportation 05/01/2021   Mixed hyperlipidemia 01/11/2020   Allergies 12/01/2018   Essential hypertension 12/01/2018   Food insecurity 12/01/2018   Alzheimer's dementia with behavioral disturbance (HCC)    Acute metabolic encephalopathy    Vitamin B12 deficiency 09/22/2018   History of stroke 09/22/2018   Old Cerebral infarction (HCC) 09/21/2018    Orientation RESPIRATION BLADDER Height & Weight     Self, Place  Normal Incontinent, Indwelling catheter Weight:   Height:     BEHAVIORAL SYMPTOMS/MOOD NEUROLOGICAL BOWEL NUTRITION STATUS      Incontinent Diet (see discharge summary)  AMBULATORY STATUS COMMUNICATION OF NEEDS Skin   Total Care Verbally Other (Comment) (left thigh pressure  injury; left groin irritant dermatitis)                       Personal Care Assistance Level of Assistance  Total care Bathing Assistance: Maximum assistance   Dressing Assistance: Maximum assistance Total Care Assistance: Maximum assistance   Functional Limitations Info  Sight, Hearing, Speech Sight Info: Impaired Hearing Info: Impaired Speech Info: Adequate    SPECIAL CARE FACTORS FREQUENCY  PT (By licensed PT), OT (By licensed OT)     PT Frequency: eval and treat OT Frequency: eval and treat            Contractures Contractures Info: Not present    Additional Factors Info  Code Status, Allergies Code Status Info: full Allergies Info: NKA           Current Medications (10/01/2023):  This is the current hospital active medication list Current Facility-Administered Medications  Medication Dose Route Frequency Provider Last Rate Last Admin   acetaminophen (TYLENOL) tablet 1,000 mg  1,000 mg Oral TID Lorin Glass, MD   1,000 mg at 10/01/23 0807   albuterol (PROVENTIL) (2.5 MG/3ML) 0.083% nebulizer solution 2.5 mg  2.5 mg Nebulization Q6H PRN Dahal, Melina Schools, MD       amLODipine (NORVASC) tablet 10 mg  10 mg Oral Daily Dahal, Binaya, MD   10 mg at 10/01/23 0807   atorvastatin (LIPITOR) tablet 40 mg  40 mg Oral QHS Dahal, Melina Schools, MD   40 mg at 09/30/23 2127   bisacodyl (DULCOLAX) EC tablet 5 mg  5 mg Oral Daily PRN Lorin Glass, MD  Chlorhexidine Gluconate Cloth 2 % PADS 6 each  6 each Topical Daily Rai, Ripudeep K, MD   6 each at 10/01/23 0808   enoxaparin (LOVENOX) injection 40 mg  40 mg Subcutaneous Q24H Dahal, Melina Schools, MD   40 mg at 09/30/23 2127   hydrochlorothiazide (HYDRODIURIL) tablet 12.5 mg  12.5 mg Oral Daily Dahal, Binaya, MD   12.5 mg at 10/01/23 0807   lactulose (CHRONULAC) enema 200 gm  300 mL Rectal Junious Silk, Rosita Fire, MD   300 mL at 10/01/23 1507   morphine (PF) 2 MG/ML injection 1 mg  1 mg Intravenous Q4H PRN Dahal, Binaya, MD        oxyCODONE (Oxy IR/ROXICODONE) immediate release tablet 5 mg  5 mg Oral Q4H PRN Dahal, Binaya, MD       polyethylene glycol (MIRALAX / GLYCOLAX) packet 17 g  17 g Oral Daily PRN Lorin Glass, MD         Discharge Medications: Please see discharge summary for a list of discharge medications.  Relevant Imaging Results:  Relevant Lab Results:   Additional Information SS#: [id number]  Lorri Frederick, LCSW

## 2023-10-01 NOTE — Progress Notes (Signed)
Physical Therapy Treatment Patient Details Name: Austin Weaver MRN: [id number] DOB: Feb 15, 1946 Today's Date: 10/01/2023   History of Present Illness Pt is a 77 y/o male who presents 09/22/2023 with LE weakness, numbness, and inability to walk. Pt is from [facility] nursing facility where he is a long term resident. MRI revealed T8 vertebral body lesion concerning for malignancy - lymphoma vs metastasis, with additional smaller lesions at T10, T12, S1 vertebral bodies. Neurosurgery consulted, pt is not a surgical candidate. PMH significant for CVA/TIA, HTN, laporotomy.    PT Comments  Pt tolerating mobility well, not showing signs of pain with mvmt. Continues to have no active mvmt of LE's and decreased trunk control in sitting. Needed tot A +2 to roll in bed and perform SL to sit to EOB. Min A needed to maintain sitting due to posterior pelvic tilt and posterior lean. Maximove used to transfer pt bed to chair. Pt tolerated cervical ROM exercises while in sling of maximove. Patient will benefit from continued inpatient follow up therapy, <3 hours/day. PT will continue to follow.     If plan is discharge home, recommend the following: Two people to help with walking and/or transfers;Two people to help with bathing/dressing/bathroom;Direct supervision/assist for medications management;Direct supervision/assist for financial management;Assist for transportation;Help with stairs or ramp for entrance;Supervision due to cognitive status   Can travel by private vehicle     No  Equipment Recommendations  Wheelchair (measurements PT);Wheelchair cushion (measurements PT)    Recommendations for Other Services       Precautions / Restrictions Precautions Precautions: Fall Precaution Comments: back for comfort Restrictions Weight Bearing Restrictions Per Provider Order: No     Mobility  Bed Mobility Overal bed mobility: Needs Assistance Bed Mobility: Rolling, Sidelying to Sit Rolling: Max  assist, +2 for physical assistance Sidelying to sit: Max assist, +2 for physical assistance       General bed mobility comments: Pt able to use BUEs to reach for rails during rolling activity. Lift pad placed and used to assist with SL to sit. Max A +2 needed for LE"s off EOB andf elevation of trunk    Transfers Overall transfer level: Needs assistance Equipment used: Ambulation equipment used Transfers: Bed to chair/wheelchair/BSC             General transfer comment: Maximove lift from bed>chair. Pt appeared comfortable throughout transfer Transfer via Lift Equipment: Maximove  Ambulation/Gait               General Gait Details: Unable at this time, no mvmt or sensation BLE's   Stairs             Wheelchair Mobility     Tilt Bed    Modified Rankin (Stroke Patients Only)       Balance Overall balance assessment: Needs assistance Sitting-balance support: No upper extremity supported, Feet supported Sitting balance-Leahy Scale: Poor Sitting balance - Comments: min A needed in sititng due to LOB in all directions. Posterior pelviv tilt and FHP                                    Cognition Arousal: Alert Behavior During Therapy: WFL for tasks assessed/performed Overall Cognitive Status: Impaired/Different from baseline Area of Impairment: Orientation, Following commands, Memory, Safety/judgement, Awareness, Problem solving, Attention                 Orientation Level: Disoriented to, Time, Situation Current Attention  Level: Sustained Memory: Decreased recall of precautions, Decreased short-term memory Following Commands: Follows one step commands consistently, Follows one step commands with increased time, Follows multi-step commands inconsistently Safety/Judgement: Decreased awareness of deficits, Decreased awareness of safety Awareness: Intellectual Problem Solving: Slow processing, Decreased initiation, Difficulty sequencing,  Requires verbal cues, Requires tactile cues General Comments: follows simple commands with increased time but demonstrates decreased problem solving.        Exercises Other Exercises Other Exercises: shoulder shrugs x10 Other Exercises: lateral cervical flexion x10 Other Exercises: cervical rotation x10    General Comments General comments (skin integrity, edema, etc.): VSS on RA      Pertinent Vitals/Pain Pain Assessment Pain Assessment: Faces Faces Pain Scale: No hurt    Home Living                          Prior Function            PT Goals (current goals can now be found in the care plan section) Acute Rehab PT Goals Patient Stated Goal: Get better PT Goal Formulation: With patient Time For Goal Achievement: 10/08/23 Potential to Achieve Goals: Good Progress towards PT goals: Progressing toward goals    Frequency    Min 1X/week      PT Plan      Co-evaluation              AM-PAC PT "6 Clicks" Mobility   Outcome Measure  Help needed turning from your back to your side while in a flat bed without using bedrails?: Total Help needed moving from lying on your back to sitting on the side of a flat bed without using bedrails?: Total Help needed moving to and from a bed to a chair (including a wheelchair)?: Total Help needed standing up from a chair using your arms (e.g., wheelchair or bedside chair)?: Total Help needed to walk in hospital room?: Total Help needed climbing 3-5 steps with a railing? : Total 6 Click Score: 6    End of Session   Activity Tolerance: Patient tolerated treatment well Patient left: in chair;with call bell/phone within reach;with chair alarm set Nurse Communication: Mobility status;Need for lift equipment PT Visit Diagnosis: Muscle weakness (generalized) (M62.81);Other symptoms and signs involving the nervous system (R29.898);Difficulty in walking, not elsewhere classified (R26.2);Pain     Time: 1610-9604 PT  Time Calculation (min) (ACUTE ONLY): 21 min  Charges:    $Therapeutic Activity: 8-22 mins PT General Charges $$ ACUTE PT VISIT: 1 Visit                     Lyanne Co, PT  Acute Rehab Services Secure chat preferred Office [phone]    Lawana Chambers Sahiti Joswick 10/01/2023, 12:28 PM

## 2023-10-02 DIAGNOSIS — G8220 Paraplegia, unspecified: Secondary | ICD-10-CM | POA: Diagnosis not present

## 2023-10-02 LAB — CBC WITH DIFFERENTIAL/PLATELET
Abs Immature Granulocytes: 0.34 10*3/uL — ABNORMAL HIGH (ref 0.00–0.07)
Basophils Absolute: 0.1 10*3/uL (ref 0.0–0.1)
Basophils Relative: 0 %
Eosinophils Absolute: 0.1 10*3/uL (ref 0.0–0.5)
Eosinophils Relative: 1 %
HCT: 43.2 % (ref 39.0–52.0)
Hemoglobin: 15.1 g/dL (ref 13.0–17.0)
Immature Granulocytes: 3 %
Lymphocytes Relative: 12 %
Lymphs Abs: 1.6 10*3/uL (ref 0.7–4.0)
MCH: 30 pg (ref 26.0–34.0)
MCHC: 35 g/dL (ref 30.0–36.0)
MCV: 85.7 fL (ref 80.0–100.0)
Monocytes Absolute: 1.3 10*3/uL — ABNORMAL HIGH (ref 0.1–1.0)
Monocytes Relative: 10 %
Neutro Abs: 9.6 10*3/uL — ABNORMAL HIGH (ref 1.7–7.7)
Neutrophils Relative %: 74 %
Platelets: 291 10*3/uL (ref 150–400)
RBC: 5.04 MIL/uL (ref 4.22–5.81)
RDW: 14.5 % (ref 11.5–15.5)
WBC: 12.9 10*3/uL — ABNORMAL HIGH (ref 4.0–10.5)
nRBC: 0 % (ref 0.0–0.2)

## 2023-10-02 LAB — BASIC METABOLIC PANEL
Anion gap: 12 (ref 5–15)
BUN: 77 mg/dL — ABNORMAL HIGH (ref 8–23)
CO2: 27 mmol/L (ref 22–32)
Calcium: 8.9 mg/dL (ref 8.9–10.3)
Chloride: 93 mmol/L — ABNORMAL LOW (ref 98–111)
Creatinine, Ser: 1.73 mg/dL — ABNORMAL HIGH (ref 0.61–1.24)
GFR, Estimated: 41 mL/min — ABNORMAL LOW (ref >60)
Glucose, Bld: 121 mg/dL — ABNORMAL HIGH (ref 70–99)
Potassium: 3.8 mmol/L (ref 3.5–5.1)
Sodium: 132 mmol/L — ABNORMAL LOW (ref 135–145)

## 2023-10-02 MED ORDER — CHLORPROMAZINE HCL 10 MG PO TABS
10.0000 mg | ORAL_TABLET | Freq: Every day | ORAL | Status: DC | PRN
  Administered 2023-10-02: 10 mg via ORAL
  Filled 2023-10-02 (×3): qty 1

## 2023-10-02 MED ORDER — SODIUM CHLORIDE 0.9 % IV SOLN: INTRAVENOUS | Status: DC

## 2023-10-02 MED ORDER — SODIUM CHLORIDE 0.9 % IV SOLN
1.0000 g | INTRAVENOUS | Status: DC
  Administered 2023-10-02 – 2023-10-06 (×5): 1 g via INTRAVENOUS
  Filled 2023-10-02 (×5): qty 10

## 2023-10-02 NOTE — Progress Notes (Signed)
PROGRESS NOTE  Austin Weaver  DOB: 1946-04-01  PCP: Mort Sawyers, FNP DGU:[id number]  DOA: 09/21/2023  LOS: 10 days  Hospital Day: 12  Brief narrative: Austin Weaver is a 77 y.o. male with PMH significant for dementia, HTN, stroke who is a long-term nursing home patient at [facility]. At baseline, patient was able to ambulate.   Per report, yesterday he walked to the bathroom and while getting back, he felt that his legs were shaky, weak and were going to give out.  He got back to the bed and since then he was not able to move his legs.  His legs remained weak overnight and today he became completely nonambulatory and.  He also could not feel any sensation in the legs and hence was brought to the ED. Denies any incontinence.   In the ED, patient was afebrile, hemodynamically stable On exam in the ED, patient was noted to have a sensory level at the umbilical level and complete flaccid paralysis below that level He was noted to have urinary retention of more than 600 mL, Foley catheter was inserted. EDP noted diminished rectal tone.   MRI lumbar spine showed 1. Large contrast-enhancing lesion involving the T8 vertebral body worrisome for malignancy -lymphoma versus metastasis. There is epidural spread of tumor resulting in severe spinal canal stenosis with compression of the spinal cord at the T8 level. 2. Additional smaller contrast-enhancing lesions in the T10, T12, and S1 vertebral bodies, concerning for additional sites of metastatic disease.   EDP spoke with neurosurgery on-call Dr. Danielle Dess.  Per neurosurgery, decompression is unlikely to be successful in this patient with his degree of varices and other comorbidities.  Steroids unlikely to be helpful.  Patient did not have any pain.  Admitted to [facility]  Subjective: Patient was seen and examined this morning. Lying on bed.  Alert, awake, knows he is in the hospital.  Unable to recall any other information.  Not sure why he is  in the hospital Foley catheter with blood-tinged urine  Assessment and plan: Acute paraplegia Vertebral body mass -primary malignancy versus metastasis Presented with sudden onset bilateral lower extremity weakness starting yesterday Noted to have flaccid paraplegia, sensory level, urine retention MRI with large T8 vertebral body malignancy with epidural spread causing severe spinal canal stenosis and compression of the spinal cord Per neurosurgery, decompression unlikely to be successful given his age and comorbidities.  Steroids unlikely to be helpful No pain symptoms Foley catheter was placed in the ED for neurogenic bladder Previous hospitalist discussed with legal guardian.  Palliative care consulted as well. 12/24, discussed with palliative care NP [facility]. She had received a call from DSS.  And apparently DSS representative wanted to go ahead with biopsy.  Patient is a frail elderly with dementia, currently in delirium with inconsistent orientation. He has not been able to move his lower extremities at all since presentation.  Neurosurgery was called from the ED.  They stated that he is not a candidate for any surgical procedure. I do not think it is medically appropriate at this time to subject him to surgical biopsy.  Even if the biopsy is done, with the result in hand, he is not a candidate for any chemo or radiation or surgery.  In my medical opinion, he is appropriate for at least DNR/DNI.  He may also qualify for hospice care. I do not think biopsy at this time will add any meaningful benefit to the patient. 12/26, I called and had this discussion with patient's  legal guardian Ms. Woodard.  She said she will escalate this issue in her department and get back to [place] in 1 to 2 days. Social worker made aware.  UTI/hematuria Acute urinary retention Has intermittent off-and-on hematuria since admission.  Foley catheter has been placed for urinary retention since admission. 12/26, urine  was noted to be cloudy and foul-smelling.  Urinalysis showed many bacteria, gross hematuria No fever but WBC count trending up. 12/26, started on IV Rocephin. Recent Labs  Lab 09/26/23 0515 09/29/23 0523 10/02/23 0539  WBC 9.5 11.9* 12.9*   AKI Creatinine normal at baseline.  Gradually worsening, 1.73 today. Continue IV hydration.  Blood pressure soft.   Hold amlodipine and HCTZ today. Continue to monitor Recent Labs    05/17/23 1908 05/17/23 1912 05/18/23 0415 05/19/23 0359 09/21/23 1132 09/22/23 0336 09/23/23 0611 09/26/23 0515 09/29/23 0739 10/02/23 0539  BUN 13 15 10 13 16 20  27* 24* 49* 77*  CREATININE 1.05 0.90 0.93 0.97 1.06 1.22 1.07 1.24 1.35* 1.73*   Hypertension Currently receiving prior meds amlodipine 10 mg daily, HCTZ 12.5 mg daily Given postop blood pressure and AKI, hold these meds today.   Hyperlipidemia Lipitor 40 mg daily aspirin   Dementia Alert and oriented to self and place only.   Pressure sores Per wound care Pressure Injury 09/25/23 Thigh Anterior;Left Stage 2 -  Partial thickness loss of dermis presenting as a shallow open injury with a red, pink wound bed without slough. open, reddened (Active)  09/25/23 1533  Location: Thigh  Location Orientation: Anterior;Left  Staging: Stage 2 -  Partial thickness loss of dermis presenting as a shallow open injury with a red, pink wound bed without slough.  Wound Description (Comments): open, reddened  Present on Admission: No     Pressure Injury 09/25/23 Thigh Anterior;Left Stage 2 -  Partial thickness loss of dermis presenting as a shallow open injury with a red, pink wound bed without slough. open, reddened (Active)  09/25/23 1534  Location: Thigh  Location Orientation: Anterior;Left  Staging: Stage 2 -  Partial thickness loss of dermis presenting as a shallow open injury with a red, pink wound bed without slough.  Wound Description (Comments): open, reddened  Present on Admission: No      Pressure Injury 09/27/23 Buttocks Mid Stage 2 -  Partial thickness loss of dermis presenting as a shallow open injury with a red, pink wound bed without slough. (Active)  09/27/23 1101  Location: Buttocks  Location Orientation: Mid  Staging: Stage 2 -  Partial thickness loss of dermis presenting as a shallow open injury with a red, pink wound bed without slough.  Wound Description (Comments):   Present on Admission:        Mobility: Unable to move  Goals of care   Code Status: Full Code     DVT prophylaxis:  enoxaparin (LOVENOX) injection 40 mg Start: 09/21/23 2200   Antimicrobials: None Fluid: None Consultants: Palliative care Family Communication: None at bedside  Status: Inpatient Level of care:  Med-Surg   Patient is from: Long-term placement at [facility] Needs to continue in-hospital care: Pending response from legal guardian on how to proceed  Diet:  Diet Order             Diet regular Room service appropriate? Yes; Fluid consistency: Thin  Diet effective now                   Scheduled Meds:  acetaminophen  1,000 mg Oral TID  atorvastatin  40 mg Oral QHS   Chlorhexidine Gluconate Cloth  6 each Topical Daily   enoxaparin (LOVENOX) injection  40 mg Subcutaneous Q24H   lactulose  300 mL Rectal QODAY    PRN meds: albuterol, bisacodyl, morphine injection, oxyCODONE, polyethylene glycol   Infusions:   sodium chloride 75 mL/hr at 10/02/23 1048   cefTRIAXone (ROCEPHIN)  IV 1 g (10/02/23 1051)    Antimicrobials: Anti-infectives (From admission, onward)    Start     Dose/Rate Route Frequency Ordered Stop   10/02/23 1000  cefTRIAXone (ROCEPHIN) 1 g in sodium chloride 0.9 % 100 mL IVPB        1 g 200 mL/hr over 30 Minutes Intravenous Every 24 hours 10/02/23 0912         Objective: Vitals:   10/02/23 0415 10/02/23 0758  BP: 117/63 101/77  Pulse: 60 73  Resp: 16 17  Temp: 97.7 F (36.5 C) 97.7 F (36.5 C)  SpO2: 100% 99%     Intake/Output Summary (Last 24 hours) at 10/02/2023 1117 Last data filed at 10/02/2023 0418 Gross per 24 hour  Intake 240 ml  Output 1150 ml  Net -910 ml   There were no vitals filed for this visit. Weight change:  There is no height or weight on file to calculate BMI.   Physical Exam: General exam: Pleasant, elderly African-American male.  Not in distress.  Pain controlled.  Foley catheter with hematuria Skin: No rashes, lesions or ulcers. HEENT: Atraumatic, normocephalic, no obvious bleeding Lungs: Clear to auscultation bilaterally CVS: S1, S2, no murmur,   GI/Abd: Soft, nontender, nondistended, bowel sound present,   CNS: Alert, awake, oriented to place only.  Remains paraplegic  psychiatry: Mood appropriate Extremities: No pedal edema, no calf tenderness,   Data Review: I have personally reviewed the laboratory data and studies available.  F/u labs ordered Unresulted Labs (From admission, onward)    None      Total time spent in review of labs and imaging, patient evaluation, formulation of plan, documentation and communication with family: 45 minutes  Signed, Lorin Glass, MD [facility] 10/02/2023

## 2023-10-02 NOTE — TOC Progression Note (Signed)
Transition of Care [facility]) - Progression Note    Patient Details  Name: Austin Weaver MRN: [id number] Date of Birth: 1946-08-21  [facility]) CM/SW Contact  Lorri Frederick, LCSW Phone Number: 10/02/2023, 2:32 PM  Clinical Narrative:   CSW spoke with DSS CSW/legal guardian Candace Cruise, confirmed that fax was sent yesterday with MD progress note.  Wynelle Bourgeois is not working today and also not working next week [employer].  Covering CSW for her is [name], [phone].  The medical decision will need to be made by DSS director Jay Schlichter, [phone].  Mrs Candise Che is Dala Dock, [phone].  Dontay will alert [name] to get the faxed MD progress note to Jay Schlichter, who will review.    Expected Discharge Plan: Skilled Nursing Facility Barriers to Discharge: Continued Medical Work up  Expected Discharge Plan and Services     Post Acute Care Choice: Skilled Nursing Facility Living arrangements for the past 2 months: Skilled Nursing Facility                                       Social Determinants of Health (SDOH) Interventions SDOH Screenings   Food Insecurity: Patient Unable To Answer (09/24/2023)  Housing: Patient Unable To Answer (09/24/2023)  Transportation Needs: Patient Unable To Answer (09/24/2023)  Utilities: Not At Risk (09/24/2023)  Depression (PHQ2-9): Low Risk  (07/11/2022)  Financial Resource Strain: Medium Risk (12/01/2018)  Tobacco Use: High Risk (09/21/2023)    Readmission Risk Interventions     No data to display

## 2023-10-02 NOTE — Plan of Care (Signed)

## 2023-10-02 NOTE — Consult Note (Addendum)
WOC Nurse Consult Note: Reason for Consult: Requested to assess a buttock injury. Wound type: Skin tear on the left side.  Measurement: 4x3cm Wound bed: 100% red, partial skin roller over. Drainage (amount, consistency, odor) low amount, serosanguinous. Periwound: intact, partial unattached viable skin, please don`t remove. Keep above the wound bed. Dressing procedure/placement/frequency: Apply foam dressing, change 3Q or PRN soiling.  WOC team will not plan to follow further.  Please reconsult if further assistance is needed. Thank-you,  Denyse Amass BSN, RN, [employer], WOC  (Pager: [phone])

## 2023-10-03 DIAGNOSIS — G8220 Paraplegia, unspecified: Secondary | ICD-10-CM | POA: Diagnosis not present

## 2023-10-03 MED ORDER — CHLORPROMAZINE HCL 10 MG PO TABS
10.0000 mg | ORAL_TABLET | Freq: Every day | ORAL | Status: AC
  Administered 2023-10-03 – 2023-10-05 (×3): 10 mg via ORAL
  Filled 2023-10-03 (×3): qty 1

## 2023-10-03 NOTE — Progress Notes (Signed)
PROGRESS NOTE  Austin Weaver  DOB: 08-20-1946  PCP: Mort Sawyers, FNP WUJ:[id number]  DOA: 09/21/2023  LOS: 11 days  Hospital Day: 13  Brief narrative: Austin Weaver is a 77 y.o. male with PMH significant for dementia, HTN, stroke who is a long-term nursing home patient at [place]. At baseline, patient was able to ambulate.   Per report, yesterday he walked to the bathroom and while getting back, he felt that his legs were shaky, weak and were going to give out.  He got back to the bed and since then he was not able to move his legs.  His legs remained weak overnight and today he became completely nonambulatory and.  He also could not feel any sensation in the legs and hence was brought to the ED. Denies any incontinence.   In the ED, patient was afebrile, hemodynamically stable On exam in the ED, patient was noted to have a sensory level at the umbilical level and complete flaccid paralysis below that level He was noted to have urinary retention of more than 600 mL, Foley catheter was inserted. EDP noted diminished rectal tone.   MRI lumbar spine showed 1. Large contrast-enhancing lesion involving the T8 vertebral body worrisome for malignancy -lymphoma versus metastasis. There is epidural spread of tumor resulting in severe spinal canal stenosis with compression of the spinal cord at the T8 level. 2. Additional smaller contrast-enhancing lesions in the T10, T12, and S1 vertebral bodies, concerning for additional sites of metastatic disease.   EDP spoke with neurosurgery on-call Dr. Danielle Dess.  Per neurosurgery, decompression is unlikely to be successful in this patient with his degree of varices and other comorbidities.  Steroids unlikely to be helpful.  Patient did not have any pain.  Admitted to [facility]  Subjective: Patient was seen and examined this morning. Propped up in bed.  Strawberry pink urine in Urobag.  Has hiccups for last 2 days.  Assessment and plan: Acute  paraplegia Vertebral body mass -primary malignancy versus metastasis Presented with sudden onset bilateral lower extremity weakness starting yesterday Noted to have flaccid paraplegia, sensory level, urine retention MRI with large T8 vertebral body malignancy with epidural spread causing severe spinal canal stenosis and compression of the spinal cord Per neurosurgery, decompression unlikely to be successful given his age and comorbidities.  Steroids unlikely to be helpful No pain symptoms Foley catheter was placed in the ED for neurogenic bladder Previous hospitalist discussed with legal guardian.  Palliative care consulted as well. 12/24, discussed with palliative care NP [facility]. She had received a call from DSS.  And apparently DSS representative wanted to go ahead with biopsy.  Patient is a frail elderly with dementia, currently in delirium with inconsistent orientation. He has not been able to move his lower extremities at all since presentation.  Neurosurgery was called from the ED.  They stated that he is not a candidate for any surgical procedure. I do not think it is medically appropriate at this time to subject him to surgical biopsy.  Even if the biopsy is done, with the result in hand, he is not a candidate for any chemo or radiation or surgery.  In my medical opinion, he is appropriate for at least DNR/DNI.  He may also qualify for hospice care. I do not think biopsy at this time will add any meaningful benefit to the patient. 12/26, I called and had this discussion with patient's legal guardian Ms. Woodard.  She said she will escalate this issue in her department and  get back to [place] in 1 to 2 days. Social worker made aware.  UTI/hematuria Acute urinary retention Had intermittent off-and-on hematuria since admission.  Foley catheter has been placed for urinary retention since admission. 12/26, urine was noted to be cloudy and foul-smelling.  Urinalysis showed many bacteria, gross  hematuria No fever but WBC count trending up. 12/26, started on IV Rocephin. Strawberry pink urine in Urobag Recent Labs  Lab 09/29/23 0523 10/02/23 0539  WBC 11.9* 12.9*   AKI Creatinine normal at baseline.  Gradually worsening, 1.73 today. Continue IV hydration.  Blood pressure soft.   Hold amlodipine and HCTZ today. Continue to monitor Recent Labs    05/17/23 1908 05/17/23 1912 05/18/23 0415 05/19/23 0359 09/21/23 1132 09/22/23 0336 09/23/23 0611 09/26/23 0515 09/29/23 0739 10/02/23 0539  BUN 13 15 10 13 16 20  27* 24* 49* 77*  CREATININE 1.05 0.90 0.93 0.97 1.06 1.22 1.07 1.24 1.35* 1.73*   Hypertension Currently receiving prior meds amlodipine 10 mg daily, HCTZ 12.5 mg daily Given postop blood pressure and AKI, hold these meds today.   Hyperlipidemia Lipitor 40 mg daily aspirin   Dementia Alert and oriented to self and place only.  Hiccups Was started on Thorazine as needed yesterday.  Switch to scheduled today.   Pressure sores Per wound care Pressure Injury 09/25/23 Thigh Anterior;Left Stage 2 -  Partial thickness loss of dermis presenting as a shallow open injury with a red, pink wound bed without slough. open, reddened (Active)  09/25/23 1533  Location: Thigh  Location Orientation: Anterior;Left  Staging: Stage 2 -  Partial thickness loss of dermis presenting as a shallow open injury with a red, pink wound bed without slough.  Wound Description (Comments): open, reddened  Present on Admission: No     Pressure Injury 09/25/23 Thigh Anterior;Left Stage 2 -  Partial thickness loss of dermis presenting as a shallow open injury with a red, pink wound bed without slough. open, reddened (Active)  09/25/23 1534  Location: Thigh  Location Orientation: Anterior;Left  Staging: Stage 2 -  Partial thickness loss of dermis presenting as a shallow open injury with a red, pink wound bed without slough.  Wound Description (Comments): open, reddened  Present on  Admission: No     Pressure Injury 09/27/23 Buttocks Mid Stage 2 -  Partial thickness loss of dermis presenting as a shallow open injury with a red, pink wound bed without slough. (Active)  09/27/23 1101  Location: Buttocks  Location Orientation: Mid  Staging: Stage 2 -  Partial thickness loss of dermis presenting as a shallow open injury with a red, pink wound bed without slough.  Wound Description (Comments):   Present on Admission:        Mobility: Unable to move  Goals of care   Code Status: Full Code     DVT prophylaxis:  enoxaparin (LOVENOX) injection 40 mg Start: 09/21/23 2200   Antimicrobials: None Fluid: None Consultants: Palliative care Family Communication: None at bedside  Status: Inpatient Level of care:  Med-Surg   Patient is from: Long-term placement at [facility] Needs to continue in-hospital care: Pending response from legal guardian on how to proceed  Diet:  Diet Order             Diet regular Room service appropriate? Yes; Fluid consistency: Thin  Diet effective now                   Scheduled Meds:  acetaminophen  1,000 mg Oral TID  atorvastatin  40 mg Oral QHS   Chlorhexidine Gluconate Cloth  6 each Topical Daily   chlorproMAZINE  10 mg Oral Daily   enoxaparin (LOVENOX) injection  40 mg Subcutaneous Q24H   lactulose  300 mL Rectal QODAY    PRN meds: albuterol, bisacodyl, morphine injection, oxyCODONE, polyethylene glycol   Infusions:   sodium chloride 75 mL/hr at 10/03/23 0043   cefTRIAXone (ROCEPHIN)  IV 1 g (10/03/23 1109)    Antimicrobials: Anti-infectives (From admission, onward)    Start     Dose/Rate Route Frequency Ordered Stop   10/02/23 1000  cefTRIAXone (ROCEPHIN) 1 g in sodium chloride 0.9 % 100 mL IVPB        1 g 200 mL/hr over 30 Minutes Intravenous Every 24 hours 10/02/23 0912         Objective: Vitals:   10/03/23 0334 10/03/23 0844  BP: 112/63 119/82  Pulse: 77 66  Resp: 19 18  Temp: 98.1 F (36.7 C)  98.1 F (36.7 C)  SpO2: 100% 98%    Intake/Output Summary (Last 24 hours) at 10/03/2023 1143 Last data filed at 10/03/2023 0335 Gross per 24 hour  Intake 676.36 ml  Output 1250 ml  Net -573.64 ml   There were no vitals filed for this visit. Weight change:  There is no height or weight on file to calculate BMI.   Physical Exam: General exam: Pleasant, elderly African-American male.  Not in distress.  Pain controlled.  Foley catheter with improving hematuria Skin: No rashes, lesions or ulcers. HEENT: Atraumatic, normocephalic, no obvious bleeding Lungs: Clear to auscultation bilaterally CVS: S1, S2, no murmur,   GI/Abd: Soft, nontender, nondistended, bowel sound present,   CNS: Alert, awake, oriented to place only.  Remains paraplegic  psychiatry: Mood appropriate Extremities: No pedal edema, no calf tenderness,   Data Review: I have personally reviewed the laboratory data and studies available.  F/u labs ordered Unresulted Labs (From admission, onward)     Start     Ordered   10/04/23 0500  Basic metabolic panel  Tomorrow morning,   R       Question:  Specimen collection method  Answer:  Lab=Lab collect   10/03/23 0752   10/04/23 0500  CBC with Differential/Platelet  Tomorrow morning,   R       Question:  Specimen collection method  Answer:  Lab=Lab collect   10/03/23 0752           Total time spent in review of labs and imaging, patient evaluation, formulation of plan, documentation and communication with family: 45 minutes  Signed, Lorin Glass, MD [facility] 10/03/2023

## 2023-10-04 DIAGNOSIS — G8220 Paraplegia, unspecified: Secondary | ICD-10-CM | POA: Diagnosis not present

## 2023-10-04 LAB — CBC WITH DIFFERENTIAL/PLATELET
Abs Immature Granulocytes: 0.58 10*3/uL — ABNORMAL HIGH (ref 0.00–0.07)
Basophils Absolute: 0.1 10*3/uL (ref 0.0–0.1)
Basophils Relative: 1 %
Eosinophils Absolute: 0.2 10*3/uL (ref 0.0–0.5)
Eosinophils Relative: 2 %
HCT: 40.6 % (ref 39.0–52.0)
Hemoglobin: 14 g/dL (ref 13.0–17.0)
Immature Granulocytes: 6 %
Lymphocytes Relative: 21 %
Lymphs Abs: 2.1 10*3/uL (ref 0.7–4.0)
MCH: 30 pg (ref 26.0–34.0)
MCHC: 34.5 g/dL (ref 30.0–36.0)
MCV: 87.1 fL (ref 80.0–100.0)
Monocytes Absolute: 1 10*3/uL (ref 0.1–1.0)
Monocytes Relative: 10 %
Neutro Abs: 5.9 10*3/uL (ref 1.7–7.7)
Neutrophils Relative %: 60 %
Platelets: 286 10*3/uL (ref 150–400)
RBC: 4.66 MIL/uL (ref 4.22–5.81)
RDW: 14.6 % (ref 11.5–15.5)
WBC: 9.8 10*3/uL (ref 4.0–10.5)
nRBC: 0 % (ref 0.0–0.2)

## 2023-10-04 LAB — BASIC METABOLIC PANEL
Anion gap: 9 (ref 5–15)
BUN: 39 mg/dL — ABNORMAL HIGH (ref 8–23)
CO2: 22 mmol/L (ref 22–32)
Calcium: 8.4 mg/dL — ABNORMAL LOW (ref 8.9–10.3)
Chloride: 106 mmol/L (ref 98–111)
Creatinine, Ser: 1.11 mg/dL (ref 0.61–1.24)
GFR, Estimated: >60 mL/min (ref >60)
Glucose, Bld: 126 mg/dL — ABNORMAL HIGH (ref 70–99)
Potassium: 4.1 mmol/L (ref 3.5–5.1)
Sodium: 137 mmol/L (ref 135–145)

## 2023-10-04 NOTE — Progress Notes (Signed)
PROGRESS NOTE  Austin Weaver  DOB: 02-Nov-1945  PCP: Mort Sawyers, FNP JYN:[id number]  DOA: 09/21/2023  LOS: 12 days  Hospital Day: 14  Brief narrative: Austin Weaver is a 77 y.o. male with PMH significant for dementia, HTN, stroke who is a long-term nursing home patient at [facility]. At baseline, patient was able to ambulate.   Per report, yesterday he walked to the bathroom and while getting back, he felt that his legs were shaky, weak and were going to give out.  He got back to the bed and since then he was not able to move his legs.  His legs remained weak overnight and today he became completely nonambulatory and.  He also could not feel any sensation in the legs and hence was brought to the ED. Denies any incontinence.   In the ED, patient was afebrile, hemodynamically stable On exam in the ED, patient was noted to have a sensory level at the umbilical level and complete flaccid paralysis below that level He was noted to have urinary retention of more than 600 mL, Foley catheter was inserted. EDP noted diminished rectal tone.   MRI lumbar spine showed 1. Large contrast-enhancing lesion involving the T8 vertebral body worrisome for malignancy -lymphoma versus metastasis. There is epidural spread of tumor resulting in severe spinal canal stenosis with compression of the spinal cord at the T8 level. 2. Additional smaller contrast-enhancing lesions in the T10, T12, and S1 vertebral bodies, concerning for additional sites of metastatic disease.   EDP spoke with neurosurgery on-call Dr. Danielle Dess.  Per neurosurgery, decompression is unlikely to be successful in this patient with his degree of varices and other comorbidities.  Steroids unlikely to be helpful.  Patient did not have any pain.  Admitted to [facility]  His hospitalization has been prolonged because of uncertainty on the next step of care. When palliative discussed with the legal guardian and DSS supervisor, they wanted to  pursue biopsy and further cancer care.  In my opinion, it is not medically appropriate.  Because patient is demented, cannot recall why he is here in the hospital.  He is not a candidate for any surgery, chemo or radiation.  I started the conversation again with DSS and asked him to consider DNR/DNI.  SW has faxed requested notes.  We have not heard back from them however.  Subjective: Patient was seen and examined this morning. Propped up in bed.  Urine bag with clear urine  Assessment and plan: Acute paraplegia Vertebral body mass -primary malignancy versus metastasis Presented with sudden onset bilateral lower extremity weakness starting yesterday Noted to have flaccid paraplegia, sensory level, urine retention MRI with large T8 vertebral body malignancy with epidural spread causing severe spinal canal stenosis and compression of the spinal cord Per neurosurgery, decompression unlikely to be successful given his age and comorbidities.  Steroids unlikely to be helpful No pain symptoms Foley catheter was placed in the ED for neurogenic bladder Previous hospitalist discussed with legal guardian.  Palliative care consulted as well. 12/24, discussed with palliative care NP [facility]. She had received a call from DSS.  And apparently DSS representative wanted to go ahead with biopsy.  Patient is a frail elderly with dementia, currently in delirium with inconsistent orientation. He has not been able to move his lower extremities at all since presentation.  Neurosurgery was called from the ED.  They stated that he is not a candidate for any surgical procedure. I do not think it is medically appropriate at this time  to subject him to surgical biopsy.  Even if the biopsy is done, with the result in hand, he is not a candidate for any chemo or radiation or surgery.  In my medical opinion, he is appropriate for at least DNR/DNI.  He may also qualify for hospice care. I do not think biopsy at this time will  add any meaningful benefit to the patient. 12/26, I called and had this discussion with patient's legal guardian Ms. Woodard.  She said she will escalate this issue in her department and get back to [place] in 1 to 2 days. Social worker made aware.  UTI/hematuria Acute urinary retention Had intermittent off-and-on hematuria since admission.  Foley catheter has been placed for urinary retention since admission. 12/26, urine was noted to be cloudy and foul-smelling.  Urinalysis showed many bacteria, gross hematuria No fever but WBC count trending up. 12/26, started on IV Rocephin. Urobag with clear urine. Recent Labs  Lab 09/29/23 0523 10/02/23 0539 10/04/23 0445  WBC 11.9* 12.9* 9.8   AKI Creatinine normal at baseline.  Gradually worsening, 1.73 today. Continue IV hydration.  Blood pressure soft.   Amlodipine and HCTZ are on hold Continue to monitor Recent Labs    05/17/23 1912 05/18/23 0415 05/19/23 0359 09/21/23 1132 09/22/23 0336 09/23/23 0611 09/26/23 0515 09/29/23 0739 10/02/23 0539 10/04/23 0445  BUN 15 10 13 16 20  27* 24* 49* 77* 39*  CREATININE 0.90 0.93 0.97 1.06 1.22 1.07 1.24 1.35* 1.73* 1.11   Hypertension Currently receiving prior meds amlodipine 10 mg daily, HCTZ 12.5 mg daily Given postop blood pressure and AKI, hold these meds today.   Hyperlipidemia Lipitor 40 mg daily aspirin   Dementia Alert and oriented to self and place only.  Hiccups Was started on Thorazine as needed yesterday.  Switch to scheduled today.   Pressure sores Per wound care Pressure Injury 09/25/23 Thigh Anterior;Left Stage 2 -  Partial thickness loss of dermis presenting as a shallow open injury with a red, pink wound bed without slough. open, reddened (Active)  09/25/23 1533  Location: Thigh  Location Orientation: Anterior;Left  Staging: Stage 2 -  Partial thickness loss of dermis presenting as a shallow open injury with a red, pink wound bed without slough.  Wound Description  (Comments): open, reddened  Present on Admission: No     Pressure Injury 09/25/23 Thigh Anterior;Left Stage 2 -  Partial thickness loss of dermis presenting as a shallow open injury with a red, pink wound bed without slough. open, reddened (Active)  09/25/23 1534  Location: Thigh  Location Orientation: Anterior;Left  Staging: Stage 2 -  Partial thickness loss of dermis presenting as a shallow open injury with a red, pink wound bed without slough.  Wound Description (Comments): open, reddened  Present on Admission: No     Pressure Injury 09/27/23 Buttocks Mid Stage 2 -  Partial thickness loss of dermis presenting as a shallow open injury with a red, pink wound bed without slough. (Active)  09/27/23 1101  Location: Buttocks  Location Orientation: Mid  Staging: Stage 2 -  Partial thickness loss of dermis presenting as a shallow open injury with a red, pink wound bed without slough.  Wound Description (Comments):   Present on Admission:        Mobility: Unable to move  Goals of care   Code Status: Full Code     DVT prophylaxis:  enoxaparin (LOVENOX) injection 40 mg Start: 09/21/23 2200   Antimicrobials: None Fluid: None Consultants: Palliative care Family  Communication: None at bedside  Status: Inpatient Level of care:  Med-Surg   Patient is from: Long-term placement at [facility] Needs to continue in-hospital care: Pending response from legal guardian on how to proceed  Diet:  Diet Order             Diet regular Room service appropriate? Yes; Fluid consistency: Thin  Diet effective now                   Scheduled Meds:  acetaminophen  1,000 mg Oral TID   atorvastatin  40 mg Oral QHS   Chlorhexidine Gluconate Cloth  6 each Topical Daily   chlorproMAZINE  10 mg Oral Daily   enoxaparin (LOVENOX) injection  40 mg Subcutaneous Q24H   lactulose  300 mL Rectal QODAY    PRN meds: albuterol, bisacodyl, morphine injection, oxyCODONE, polyethylene glycol    Infusions:   sodium chloride 75 mL/hr at 10/04/23 0341   cefTRIAXone (ROCEPHIN)  IV 1 g (10/04/23 0843)    Antimicrobials: Anti-infectives (From admission, onward)    Start     Dose/Rate Route Frequency Ordered Stop   10/02/23 1000  cefTRIAXone (ROCEPHIN) 1 g in sodium chloride 0.9 % 100 mL IVPB        1 g 200 mL/hr over 30 Minutes Intravenous Every 24 hours 10/02/23 0912         Objective: Vitals:   10/04/23 0503 10/04/23 0811  BP: 119/66 (!) 138/91  Pulse: 73   Resp: 20 19  Temp: 97.8 F (36.6 C) 98.1 F (36.7 C)  SpO2: 100% 99%    Intake/Output Summary (Last 24 hours) at 10/04/2023 1157 Last data filed at 10/04/2023 0900 Gross per 24 hour  Intake 960 ml  Output 1750 ml  Net -790 ml   There were no vitals filed for this visit. Weight change:  There is no height or weight on file to calculate BMI.   Physical Exam: General exam: Pleasant, elderly African-American male.  Not in distress.  Pain controlled.  Foley catheter with clear urine Skin: No rashes, lesions or ulcers. HEENT: Atraumatic, normocephalic, no obvious bleeding Lungs: Clear to auscultation bilaterally CVS: S1, S2, no murmur,   GI/Abd: Soft, nontender, nondistended, bowel sound present,   CNS: Alert, awake, oriented to place only.  Remains paraplegic  psychiatry: Mood appropriate Extremities: No pedal edema, no calf tenderness,   Data Review: I have personally reviewed the laboratory data and studies available.  F/u labs ordered Unresulted Labs (From admission, onward)    None      Total time spent in review of labs and imaging, patient evaluation, formulation of plan, documentation and communication with family: 25 minutes  Signed, Lorin Glass, MD [facility] 10/04/2023

## 2023-10-05 ENCOUNTER — Encounter (HOSPITAL_COMMUNITY): Payer: Self-pay | Admitting: Internal Medicine

## 2023-10-05 DIAGNOSIS — G8220 Paraplegia, unspecified: Secondary | ICD-10-CM | POA: Diagnosis not present

## 2023-10-05 LAB — PROTIME-INR
INR: 1.1 (ref 0.8–1.2)
Prothrombin Time: 14.7 s (ref 11.4–15.2)

## 2023-10-05 MED ORDER — DEXAMETHASONE SODIUM PHOSPHATE 4 MG/ML IJ SOLN: 4.0000 mg | Freq: Four times a day (QID) | INTRAMUSCULAR | Status: DC

## 2023-10-05 MED ORDER — DEXAMETHASONE SODIUM PHOSPHATE 4 MG/ML IJ SOLN
4.0000 mg | Freq: Four times a day (QID) | INTRAMUSCULAR | Status: DC
  Administered 2023-10-05 – 2023-10-06 (×4): 4 mg via INTRAVENOUS
  Filled 2023-10-05 (×4): qty 1

## 2023-10-05 MED ORDER — DEXAMETHASONE SODIUM PHOSPHATE 10 MG/ML IJ SOLN
10.0000 mg | Freq: Once | INTRAMUSCULAR | Status: AC
  Administered 2023-10-05: 10 mg via INTRAVENOUS
  Filled 2023-10-05: qty 1

## 2023-10-05 NOTE — Progress Notes (Signed)
Interventional Radiology Brief Note:  Patient T8 bone lesion.  IR consulted for bone lesion biopsy at the request of Dr. Pola Corn.  Case reviewed and approved by Dr. Tommie Sams.  Patient is a ward of DSS care.  He has an assigned legal guardian.  Have reached out to Jay Schlichter (per Candace Cruise direction) at [phone] for procedure consent.  Awaiting return call.   Loyce Dys, MS RD PA-C

## 2023-10-05 NOTE — Progress Notes (Signed)
PROGRESS NOTE  Austin Weaver HYQ:[id number] DOB: October 25, 1945 DOA: 09/21/2023 PCP: Mort Sawyers, FNP   LOS: 13 days   Brief Narrative / Interim history: 77 year old with mild dementia, HTN, prior CVA was a long-term SNF patient did [facility], but at baseline able to ambulate with a walker, the day prior to admission felt weak, legs were about to give out and was unable to walk.  He was brought to the ER and an MRI showed a large contrast-enhancing lesion involving the T8 vertebral body worrisome for malignancy -lymphoma versus metastasis. There is epidural spread of tumor resulting in severe spinal canal stenosis with compression of the spinal cord at the T8 level, and additional smaller contrast-enhancing lesions in the T10, T12, and S1 vertebral bodies, concerning for additional sites of metastatic disease.  Subjective / 24h Interval events: He is doing well this morning, eating breakfast, knows that he is in the hospital because of his legs  Assesement and Plan: Principal problem Acute paraplegia, vertebral body mass -this is primary malignancy versus metastasis.  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 Case was discussed on admission with neurosurgery on admission, decompression unlikely to be successful given age and comorbidities.  Steroids also unlikely to be helpful -Palliative care consulted, appreciate input.  I will discuss with oncology today  Active problems UTI, acute urinary retention, hematuria-Foley catheter is in place.  Started on ceftriaxone 12/26, no culture sent.  Plan for 2 additional days  AKI-creatinine normalized with fluids  Hypertension-blood pressure acceptable, hold home agents due to AKI  Hyperlipidemia-Lipitor  Dementia-alert to self, place, knows why he is here.  He does not know the year.  He  tells me that prior to being here he would walk with a walker and sometimes in the wheelchair, and was able to socialize and had friends in his nursing home.  Hiccups-on Thorazine   Scheduled Meds:  acetaminophen  1,000 mg Oral TID   atorvastatin  40 mg Oral QHS   Chlorhexidine Gluconate Cloth  6 each Topical Daily   enoxaparin (LOVENOX) injection  40 mg Subcutaneous Q24H   lactulose  300 mL Rectal QODAY   Continuous Infusions:  sodium chloride 75 mL/hr at 10/04/23 0341   cefTRIAXone (ROCEPHIN)  IV 1 g (10/05/23 0804)   PRN Meds:.albuterol, bisacodyl, morphine injection, oxyCODONE, polyethylene glycol  Current Outpatient Medications  Medication Instructions   acetaminophen (TYLENOL) 650 mg, Oral, Every 6 hours PRN   amLODipine (NORVASC) 10 mg, Oral, Daily   aspirin EC 81 mg, Oral, Daily, Swallow whole.   atorvastatin (LIPITOR) 40 mg, Oral, Daily   fluticasone (FLONASE) 50 MCG/ACT nasal spray 2 sprays, Each Nare, Daily   hydrochlorothiazide (HYDRODIURIL) 12.5 mg, Oral, Daily   Vitamin D (Ergocalciferol) (DRISDOL) 50,000 Units, Oral, Every 7 days, Every Tuesday    Diet Orders (From admission, onward)     Start     Ordered   09/21/23 1855  Diet regular Room service appropriate? Yes; Fluid consistency: Thin  Diet effective now       Question Answer Comment  Room service appropriate? Yes   Fluid consistency: Thin      09/21/23 1855            DVT prophylaxis: enoxaparin (LOVENOX) injection 40 mg Start: 09/21/23 2200   Lab Results  Component Value Date   PLT 286 10/04/2023  Code Status: Full Code  Family Communication: no family at bedside   Status is: Inpatient Remains inpatient appropriate because: severity of illness  Level of care: Med-Surg  Consultants:  Palliative  Objective: Vitals:   10/04/23 1319 10/04/23 2143 10/05/23 0506 10/05/23 0727  BP: (!) 145/76 135/74 136/88 (!) 146/77  Pulse: 76 68  74  Resp: 17 16 16 16   Temp: 98.4 F (36.9 C)  98.5 F (36.9 C) 97.8 F (36.6 C)   TempSrc: Oral Oral Oral   SpO2: 100% 98% 100% 100%    Intake/Output Summary (Last 24 hours) at 10/05/2023 1047 Last data filed at 10/05/2023 0807 Gross per 24 hour  Intake 480 ml  Output 1250 ml  Net -770 ml   Wt Readings from Last 3 Encounters:  05/18/23 72.8 kg  02/20/23 73.7 kg  07/11/22 78.2 kg    Examination:  Constitutional: NAD Eyes: no scleral icterus ENMT: Mucous membranes are moist.  Neck: normal, supple Respiratory: clear to auscultation bilaterally, no wheezing, no crackles. Cardiovascular: Regular rate and rhythm, no murmurs / rubs / gallops. No LE edema.  Abdomen: non distended, no tenderness. Bowel sounds positive.  Musculoskeletal: no clubbing / cyanosis.   Data Reviewed: I have independently reviewed following labs and imaging studies   CBC Recent Labs  Lab 09/29/23 0523 10/02/23 0539 10/04/23 0445  WBC 11.9* 12.9* 9.8  HGB 14.9 15.1 14.0  HCT 42.8 43.2 40.6  PLT 199 291 286  MCV 85.9 85.7 87.1  MCH 29.9 30.0 30.0  MCHC 34.8 35.0 34.5  RDW 14.3 14.5 14.6  LYMPHSABS 1.5 1.6 2.1  MONOABS 1.2* 1.3* 1.0  EOSABS 0.1 0.1 0.2  BASOSABS 0.1 0.1 0.1    Recent Labs  Lab 09/29/23 0739 10/02/23 0539 10/04/23 0445  NA 131* 132* 137  K 4.2 3.8 4.1  CL 92* 93* 106  CO2 27 27 22   GLUCOSE 118* 121* 126*  BUN 49* 77* 39*  CREATININE 1.35* 1.73* 1.11  CALCIUM 8.9 8.9 8.4*    ------------------------------------------------------------------------------------------------------------------ No results for input(s): "CHOL", "HDL", "LDLCALC", "TRIG", "CHOLHDL", "LDLDIRECT" in the last 72 hours.  Lab Results  Component Value Date   HGBA1C 5.3 02/20/2023   ------------------------------------------------------------------------------------------------------------------ No results for input(s): "TSH", "T4TOTAL", "T3FREE", "THYROIDAB" in the last 72 hours.  Invalid input(s): "FREET3"  Cardiac Enzymes No results  for input(s): "CKMB", "TROPONINI", "MYOGLOBIN" in the last 168 hours.  Invalid input(s): "CK" ------------------------------------------------------------------------------------------------------------------ No results found for: "BNP"  CBG: No results for input(s): "GLUCAP" in the last 168 hours.  No results found for this or any previous visit (from the past 240 hours).   Radiology Studies: No results found.   Pamella Pert, MD, PhD [facility]  Between 7 am - 7 pm I am available, please contact me via Amion (for emergencies) or Securechat (non urgent messages)  Between 7 pm - 7 am I am not available, please contact night coverage MD/APP via Amion

## 2023-10-06 ENCOUNTER — Inpatient Hospital Stay (HOSPITAL_COMMUNITY): Payer: Medicare HMO

## 2023-10-06 DIAGNOSIS — G8220 Paraplegia, unspecified: Secondary | ICD-10-CM | POA: Diagnosis not present

## 2023-10-06 HISTORY — PX: IR FLUORO GUIDED NEEDLE PLC ASPIRATION/INJECTION LOC: IMG2395

## 2023-10-06 MED ORDER — SODIUM CHLORIDE 0.9 % IV SOLN: 12.5000 mg | Freq: Once | INTRAVENOUS | Status: DC | PRN

## 2023-10-06 MED ORDER — FENTANYL CITRATE (PF) 100 MCG/2ML IJ SOLN
INTRAMUSCULAR | Status: AC
  Filled 2023-10-06: qty 2

## 2023-10-06 MED ORDER — LIDOCAINE HCL (PF) 1 % IJ SOLN
INTRAMUSCULAR | Status: AC
  Filled 2023-10-06: qty 30

## 2023-10-06 MED ORDER — DEXAMETHASONE 4 MG PO TABS
4.0000 mg | ORAL_TABLET | Freq: Four times a day (QID) | ORAL | Status: AC
  Administered 2023-10-06 – 2023-10-07 (×2): 4 mg via ORAL
  Filled 2023-10-06 (×2): qty 1

## 2023-10-06 MED ORDER — MIDAZOLAM HCL 2 MG/2ML IJ SOLN
INTRAMUSCULAR | Status: AC
Start: 2023-10-06 — End: ?
  Filled 2023-10-06: qty 2

## 2023-10-06 MED ORDER — BUPIVACAINE HCL (PF) 0.5 % IJ SOLN
30.0000 mL | Freq: Once | INTRAMUSCULAR | Status: AC
  Administered 2023-10-06: 10 mL

## 2023-10-06 MED ORDER — MIDAZOLAM HCL 2 MG/2ML IJ SOLN
INTRAMUSCULAR | Status: AC | PRN
  Administered 2023-10-06: .5 mg via INTRAVENOUS

## 2023-10-06 MED ORDER — CHLORPROMAZINE HCL 10 MG PO TABS
10.0000 mg | ORAL_TABLET | Freq: Once | ORAL | Status: AC
  Administered 2023-10-06: 10 mg via ORAL
  Filled 2023-10-06: qty 1

## 2023-10-06 MED ORDER — BUPIVACAINE HCL (PF) 0.5 % IJ SOLN
INTRAMUSCULAR | Status: AC
  Filled 2023-10-06: qty 30

## 2023-10-06 MED ORDER — LIDOCAINE HCL (PF) 1 % IJ SOLN
30.0000 mL | Freq: Once | INTRAMUSCULAR | Status: AC
  Administered 2023-10-06: 10 mL

## 2023-10-06 MED ORDER — FENTANYL CITRATE (PF) 100 MCG/2ML IJ SOLN
INTRAMUSCULAR | Status: AC | PRN
  Administered 2023-10-06: 50 ug via INTRAVENOUS
  Administered 2023-10-06: 25 ug via INTRAVENOUS

## 2023-10-06 MED ORDER — LIDOCAINE VISCOUS HCL 2 % MT SOLN
15.0000 mL | Freq: Once | OROMUCOSAL | Status: DC
  Filled 2023-10-06: qty 15

## 2023-10-06 NOTE — Progress Notes (Signed)
     Referral previously received for Austin Weaver for goals of care discussion. Noted most recent palliative in-person assessment dated 10/05/2023 at which time it was recommended to follow from a distance/chart check for biopsy results and recommendations from oncology.  Chart reviewed for Recent provider notes, nurse notes, vitals, labs, and imaging and updates received from RN.   At this time patient appears stable. He had IR biopsy today. Further discussion with Rad Onc recommends 48 hour course of steroids and if no improvement, likely no benefit from radiation treatment. As of today, no improvement. Plan to monitor through tomorrow. Oncology feels bx could be of benefit for some treatment and regaining ambulation may be possible, though felt the cancer is likely not curable. Next steps pending pathology.  No plan for in person follow-up at this time. We will shadow the chart for biopsy results and recommendations and follow-up at that time for further GOC. Please contact the palliative medicine provider on service for any new/urgent needs that require our assistance with this patient.  Thank you for your referral and allowing [facility] to assist in [facility] care.   Camellia Kays, NP Palliative Medicine Team Phone: [phone]  NO CHARGE

## 2023-10-06 NOTE — Plan of Care (Signed)

## 2023-10-06 NOTE — Plan of Care (Signed)

## 2023-10-06 NOTE — Progress Notes (Signed)
 PROGRESS NOTE  Austin Weaver FMW:[id number] DOB: 25-Oct-1945 DOA: 09/21/2023 PCP: Corwin Antu, FNP   LOS: 14 days   Brief Narrative / Interim history: 77 year old with mild dementia, HTN, prior CVA was a long-term SNF patient did [facility], but at baseline able to ambulate with a walker, the day prior to admission felt weak, legs were about to give out and was unable to walk.  He was brought to the ER and an MRI showed a large contrast-enhancing lesion involving the T8 vertebral body worrisome for malignancy -lymphoma versus metastasis. There is epidural spread of tumor resulting in severe spinal canal stenosis with compression of the spinal cord at the T8 level, and additional smaller contrast-enhancing lesions in the T10, T12, and S1 vertebral bodies, concerning for additional sites of metastatic disease.  Subjective / 24h Interval events: No complaints, denies any pain, doing well.  Appropriate breakfast.  Assesement and Plan: Principal problem Acute paraplegia, vertebral body mass -this is primary malignancy versus metastasis.  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 Case was discussed on admission with neurosurgery on admission, decompression unlikely to be successful given age and comorbidities.  -I have discussed case with oncology, Dr. Loretha as well as radiation oncology, Dr. Shannon.  Biopsy may be of benefit, and there are options for this to be potentially reversible.  While underlying illness is not curable, attempting to restore patient's ambulation is not unreasonable.  Radiation oncology suggested a trial of Decadron  which was started 12/30, and if there is no improvement within 1 to 2 days, there is no role for radiation therapy.  I do not see any improvement this morning -Palliative care consulted, appreciate input.   If he does not respond to steroids and radiation is unlikely to be of benefit.  Biopsy was done this morning, will defer to oncology if additional treatment should be pursued  Active problems UTI, acute urinary retention, hematuria-Foley catheter is in place.  Started on ceftriaxone  12/26, no culture sent.  Status post completed course on 12/21  AKI-creatinine normalized with fluids  Hypertension-blood pressure acceptable, hold home agents due to AKI  Hyperlipidemia-Lipitor   Dementia-alert to self, place, knows why he is here.  He does not know the year.  He tells me that prior to being here he would walk with a walker and sometimes in the wheelchair, and was able to socialize and had friends in his nursing home.  Hiccups-on Thorazine    Scheduled Meds:  acetaminophen   1,000 mg Oral TID   atorvastatin   40 mg Oral QHS   Chlorhexidine  Gluconate Cloth  6 each Topical Daily   dexamethasone  (DECADRON ) injection  4 mg Intravenous Q6H   enoxaparin  (LOVENOX ) injection  40 mg Subcutaneous Q24H   lactulose   300 mL Rectal QODAY   Continuous Infusions:  cefTRIAXone  (ROCEPHIN )  IV 1 g (10/06/23 1025)   PRN Meds:.albuterol , bisacodyl , morphine  injection, oxyCODONE , polyethylene glycol  Current Outpatient Medications  Medication Instructions   acetaminophen  (TYLENOL ) 650 mg, Oral, Every 6 hours PRN   amLODipine  (NORVASC ) 10 mg, Oral, Daily   aspirin  EC 81 mg, Oral, Daily, Swallow whole.   atorvastatin  (LIPITOR ) 40 mg, Oral, Daily   fluticasone  (FLONASE ) 50 MCG/ACT nasal spray 2 sprays, Each Nare, Daily   hydrochlorothiazide  (HYDRODIURIL ) 12.5 mg, Oral, Daily   Vitamin D (Ergocalciferol) (DRISDOL) 50,000 Units, Oral, Every 7 days, Every Tuesday  Diet Orders (From admission, onward)     Start     Ordered   10/06/23 1013  Diet regular Room service appropriate? Yes; Fluid consistency: Thin  Diet effective now       Question Answer Comment  Room service appropriate? Yes   Fluid  consistency: Thin      10/06/23 1012            DVT prophylaxis: enoxaparin  (LOVENOX ) injection 40 mg Start: 09/21/23 2200   Lab Results  Component Value Date   PLT 286 10/04/2023      Code Status: Full Code  Family Communication: no family at bedside   Status is: Inpatient Remains inpatient appropriate because: severity of illness  Level of care: Med-Surg  Consultants:  Palliative  Objective: Vitals:   10/06/23 0915 10/06/23 0920 10/06/23 0925 10/06/23 0942  BP: (!) 146/77 (!) 141/73 (!) 141/76 (!) 140/81  Pulse: 67 (!) 58 63 62  Resp: 18 15 12 16   Temp:      TempSrc:      SpO2: 100% 100% 100% 100%    Intake/Output Summary (Last 24 hours) at 10/06/2023 1058 Last data filed at 10/05/2023 1800 Gross per 24 hour  Intake --  Output 200 ml  Net -200 ml   Wt Readings from Last 3 Encounters:  05/18/23 72.8 kg  02/20/23 73.7 kg  07/11/22 78.2 kg    Examination:  Constitutional: NAD Eyes: lids and conjunctivae normal, no scleral icterus ENMT: mmm Neck: normal, supple Respiratory: clear to auscultation bilaterally, no wheezing, no crackles. Normal respiratory effort.  Cardiovascular: Regular rate and rhythm, no murmurs / rubs / gallops. No LE edema. Abdomen: soft, no distention, no tenderness. Bowel sounds positive.   Data Reviewed: I have independently reviewed following labs and imaging studies   CBC Recent Labs  Lab 10/02/23 0539 10/04/23 0445  WBC 12.9* 9.8  HGB 15.1 14.0  HCT 43.2 40.6  PLT 291 286  MCV 85.7 87.1  MCH 30.0 30.0  MCHC 35.0 34.5  RDW 14.5 14.6  LYMPHSABS 1.6 2.1  MONOABS 1.3* 1.0  EOSABS 0.1 0.2  BASOSABS 0.1 0.1    Recent Labs  Lab 10/02/23 0539 10/04/23 0445 10/05/23 1512  NA 132* 137  --   K 3.8 4.1  --   CL 93* 106  --   CO2 27 22  --   GLUCOSE 121* 126*  --   BUN 77* 39*  --   CREATININE 1.73* 1.11  --   CALCIUM  8.9 8.4*  --   INR  --   --  1.1     ------------------------------------------------------------------------------------------------------------------ No results for input(s): CHOL, HDL, LDLCALC, TRIG, CHOLHDL, LDLDIRECT in the last 72 hours.  Lab Results  Component Value Date   HGBA1C 5.3 02/20/2023   ------------------------------------------------------------------------------------------------------------------ No results for input(s): TSH, T4TOTAL, T3FREE, THYROIDAB in the last 72 hours.  Invalid input(s): FREET3  Cardiac Enzymes No results for input(s): CKMB, TROPONINI, MYOGLOBIN in the last 168 hours.  Invalid input(s): CK ------------------------------------------------------------------------------------------------------------------ No results found for: BNP  CBG: No results for input(s): GLUCAP in the last 168 hours.  No results found for this or any previous visit (from the past 240 hours).   Radiology Studies: No results found.   Nilda Fendt, MD, PhD [facility]  Between 7 am - 7 pm I am available, please contact me via Amion (for emergencies) or Securechat (non urgent messages)  Between 7 pm - 7 am I am not available, please contact night coverage MD/APP  via Amion

## 2023-10-06 NOTE — Procedures (Signed)
 INTERVENTIONAL NEURORADIOLOGY BRIEF POSTPROCEDURE NOTE  FLUOROSCOPY GUIDED T8 CORE BONE BIOPSY  Attending physician: Curtis everitt Nile Lizzie, MD  Diagnosis: Pathologic fracture T8  Access site: Percutaneous right  transpedicular.  Anesthesia: IR sedation: Moderate sedation.  Medication used: 0.5 mg Versed  IV; 75 mcg Fentanyl  IV.  Complications: None.  Estimated blood loss: Negligible.  Specimen: 2 core biopsy samples.  Findings: T 8 fracture with compromise of corresponding pedicles. Right transpedicular approach used for core biopsy. 2 core samples obtained.  The patient tolerated the procedure well without incident or complication and is in stable condition.   PLAN:

## 2023-10-07 DIAGNOSIS — G8220 Paraplegia, unspecified: Secondary | ICD-10-CM | POA: Diagnosis not present

## 2023-10-07 LAB — COMPREHENSIVE METABOLIC PANEL
ALT: 26 U/L (ref 0–44)
AST: 34 U/L (ref 15–41)
Albumin: 2.4 g/dL — ABNORMAL LOW (ref 3.5–5.0)
Alkaline Phosphatase: 71 U/L (ref 38–126)
Anion gap: 8 (ref 5–15)
BUN: 19 mg/dL (ref 8–23)
CO2: 22 mmol/L (ref 22–32)
Calcium: 8.7 mg/dL — ABNORMAL LOW (ref 8.9–10.3)
Chloride: 103 mmol/L (ref 98–111)
Creatinine, Ser: 0.81 mg/dL (ref 0.61–1.24)
GFR, Estimated: >60 mL/min (ref >60)
Glucose, Bld: 136 mg/dL — ABNORMAL HIGH (ref 70–99)
Potassium: 5.4 mmol/L — ABNORMAL HIGH (ref 3.5–5.1)
Sodium: 133 mmol/L — ABNORMAL LOW (ref 135–145)
Total Bilirubin: 0.5 mg/dL (ref 0.0–1.2)
Total Protein: 6.4 g/dL — ABNORMAL LOW (ref 6.5–8.1)

## 2023-10-07 LAB — CBC
HCT: 40.9 % (ref 39.0–52.0)
Hemoglobin: 13.9 g/dL (ref 13.0–17.0)
MCH: 29.7 pg (ref 26.0–34.0)
MCHC: 34 g/dL (ref 30.0–36.0)
MCV: 87.4 fL (ref 80.0–100.0)
Platelets: 269 10*3/uL (ref 150–400)
RBC: 4.68 MIL/uL (ref 4.22–5.81)
RDW: 14.6 % (ref 11.5–15.5)
WBC: 26.7 10*3/uL — ABNORMAL HIGH (ref 4.0–10.5)
nRBC: 0 % (ref 0.0–0.2)

## 2023-10-07 LAB — PHOSPHORUS: Phosphorus: 2.8 mg/dL (ref 2.5–4.6)

## 2023-10-07 LAB — URIC ACID: Uric Acid, Serum: 5.6 mg/dL (ref 3.7–8.6)

## 2023-10-07 LAB — LACTATE DEHYDROGENASE: LDH: 191 U/L (ref 98–192)

## 2023-10-07 LAB — MAGNESIUM: Magnesium: 2.1 mg/dL (ref 1.7–2.4)

## 2023-10-07 MED ORDER — SODIUM ZIRCONIUM CYCLOSILICATE 10 G PO PACK
10.0000 g | PACK | Freq: Once | ORAL | Status: AC
  Administered 2023-10-07: 10 g via ORAL
  Filled 2023-10-07: qty 1

## 2023-10-07 NOTE — Plan of Care (Signed)
°  Problem: Nutrition: Goal: Adequate nutrition will be maintained Outcome: Progressing   Problem: Coping: Goal: Level of anxiety will decrease Outcome: Progressing   Problem: Elimination: Goal: Will not experience complications related to bowel motility Outcome: Progressing   Problem: Pain Management: Goal: General experience of comfort will improve Outcome: Progressing

## 2023-10-07 NOTE — TOC Progression Note (Addendum)
 Transition of Care [facility]) - Progression Note    Patient Details  Name: Austin Weaver MRN: [id number] Date of Birth: 1946/08/29  Transition of Care [facility]) CM/SW Contact  Bridget Cordella Simmonds, LCSW Phone Number: 10/07/2023, 10:47 AM  Clinical Narrative:   Physician DNR form faxed to legal guardian/DSS.  CSW LM with [facility] about potential return to SNF tomorrow.   1320: message from [place].  Can receive pt back to [facility] tomorrow.  Expected Discharge Plan: Skilled Nursing Facility Barriers to Discharge: Continued Medical Work up  Expected Discharge Plan and Services     Post Acute Care Choice: Skilled Nursing Facility Living arrangements for the past 2 months: Skilled Nursing Facility                                       Social Determinants of Health (SDOH) Interventions SDOH Screenings   Food Insecurity: Patient Unable To Answer (09/24/2023)  Housing: Patient Unable To Answer (09/24/2023)  Transportation Needs: Patient Unable To Answer (09/24/2023)  Utilities: Not At Risk (09/24/2023)  Depression (PHQ2-9): Low Risk  (07/11/2022)  Financial Resource Strain: Medium Risk (12/01/2018)  Social Connections: Unknown (10/06/2023)  Tobacco Use: High Risk (10/05/2023)    Readmission Risk Interventions     No data to display

## 2023-10-07 NOTE — Progress Notes (Signed)
 Physical Therapy Treatment Patient Details Name: Austin Weaver MRN: [id number] DOB: April 23, 1946 Today's Date: 10/07/2023   History of Present Illness Pt is a 78 y/o male who presents 09/22/2023 with LE weakness, numbness, and inability to walk. Pt is from [facility] nursing facility where he is a long term resident. MRI revealed T8 vertebral body lesion concerning for malignancy - lymphoma vs metastasis, with additional smaller lesions at T10, T12, S1 vertebral bodies. Neurosurgery consulted, pt is not a surgical candidate. Palliative care consulted. If he does not respond to steroids, radiation is unlikely to be of benefit.  Biopsy performed 12/31. Oncology consulted by MD to assist with tx planning. PMH significant for CVA/TIA, HTN, laparotomy.    PT Comments  Pt received in supine, agreeable to therapy session and with good participation as able. Pt reports sensation to pressure on bil thighs but no sensation distal to his knees, which seems to be an improvement from previous PT sessions. Possible trace muscle activation to bil hips with AAROM in bed mobility, but no activation observed to bil quads or ankle musculature. Pt needing up to +2 totalA to perform log roll transfer to/from EOB and pt unable to provide enough boost with BUE to lift his hips off mattress when attempting to perform lateral scoot. PTA followed up with Unit Secretary and pt's air bed arriving to unit at end of session, RN notified pt will need totalA of multiple staff members for lateral scoot in supine to new air bed. Pt continues to benefit from PT services to progress toward functional mobility goals, pt due for PT goal update next session.    If plan is discharge home, recommend the following: Two people to help with walking and/or transfers;Two people to help with bathing/dressing/bathroom;Direct supervision/assist for medications management;Direct supervision/assist for financial management;Assist for transportation;Help  with stairs or ramp for entrance;Supervision due to cognitive status   Can travel by private vehicle     No  Equipment Recommendations  Wheelchair (measurements PT);Wheelchair cushion (measurements PT)    Recommendations for Other Services       Precautions / Restrictions Precautions Precautions: Fall;Other (comment) Precaution Comments: paraplegia Restrictions Weight Bearing Restrictions Per Provider Order: No     Mobility  Bed Mobility Overal bed mobility: Needs Assistance Bed Mobility: Rolling, Sidelying to Sit, Sit to Sidelying Rolling: Max assist, +2 for safety/equipment, Used rails Sidelying to sit: Total assist, +2 for physical assistance, HOB elevated     Sit to sidelying: Max assist, +2 for physical assistance, Used rails General bed mobility comments: cues for log rolling to L EOB, pt has trouble letting go of L bed rail with RUE when cued to elevate his trunk and needs totalA +2 with hand over hand assist to remove tight RUE grip from rail.    Transfers Overall transfer level: Needs assistance                 General transfer comment: defer OOB to chair as pt c/o fatigue and pain after sitting EOB ~4 mins.    Ambulation/Gait                   Stairs             Wheelchair Mobility     Tilt Bed    Modified Rankin (Stroke Patients Only)       Balance Overall balance assessment: Needs assistance Sitting-balance support: No upper extremity supported, Feet supported, Single extremity supported Sitting balance-Leahy Scale: Poor Sitting balance - Comments:  max to totalA to maintain upright posture at EOB ~4 mins, pt leaning toward his L sitting EOB. Postural control: Left lateral lean     Standing balance comment: NT                            Cognition Arousal: Alert Behavior During Therapy: WFL for tasks assessed/performed Overall Cognitive Status: Impaired/Different from baseline Area of Impairment:  Safety/judgement                 Orientation Level: Disoriented to, Time, Situation Current Attention Level: Sustained Memory: Decreased recall of precautions, Decreased short-term memory Following Commands: Follows one step commands with increased time Safety/Judgement: Decreased awareness of safety, Decreased awareness of deficits Awareness: Intellectual Problem Solving: Slow processing, Decreased initiation, Difficulty sequencing, Requires verbal cues, Requires tactile cues General Comments: multimodal cues and slow processing. Pt oriented to self, location, somewhat to situation and not fully to time, pt thought it was NYE although it was New Years Day. Pt cooperative as able.        Exercises Other Exercises Other Exercises: seated BLE PROM: ankle pumps and LAQ x5 reps ea Other Exercises: supine BLE AA/PROM: hip abduction (possible trace activation) x5 reps ea Other Exercises: cervical rotation in bed chair posture x5 reps in range as pain allows    General Comments General comments (skin integrity, edema, etc.): BP 148/78 and HR WFL sitting EOB, BP drop with return to supine to 119/65 HR 87 bpm. Pt able to indicate when PTA touching both anterior thighs but denies any sensation to light or deep pressure on his legs distal to knees. Pt very senistive to pressure on his trunk/abdomen.      Pertinent Vitals/Pain Pain Assessment Pain Assessment: Faces Pain Score: 4  Faces Pain Scale: Hurts even more (appears more severe than pt indicates) Pain Location: back when HOB was flattened for boosting pt and with log rolling to sit up/return to supine when trunk lifted/lowered Pain Descriptors / Indicators: Grimacing, Guarding, Discomfort, Moaning Pain Intervention(s): Limited activity within patient's tolerance, Monitored during session, Repositioned    Home Living                          Prior Function            PT Goals (current goals can now be found in the  care plan section) Acute Rehab PT Goals Patient Stated Goal: To get better and be able to use my legs PT Goal Formulation: With patient Time For Goal Achievement: 10/08/23 Progress towards PT goals: Progressing toward goals (slowly)    Frequency    Min 1X/week      PT Plan      Co-evaluation              AM-PAC PT 6 Clicks Mobility   Outcome Measure  Help needed turning from your back to your side while in a flat bed without using bedrails?: Total Help needed moving from lying on your back to sitting on the side of a flat bed without using bedrails?: Total Help needed moving to and from a bed to a chair (including a wheelchair)?: Total Help needed standing up from a chair using your arms (e.g., wheelchair or bedside chair)?: Total Help needed to walk in hospital room?: Total Help needed climbing 3-5 steps with a railing? : Total 6 Click Score: 6    End of Session Equipment Utilized  During Treatment:  (bed pads) Activity Tolerance: Patient limited by fatigue;Patient limited by pain Patient left: in bed;with call bell/phone within reach;with bed alarm set;Other (comment);with SCD's reapplied (pillow to offload his L hip/lower back, pt heels floated) Nurse Communication: Mobility status;Need for lift equipment;Other (comment) (air bed arriving at end of session, pt will need totalA to slide across bed to new air bed) PT Visit Diagnosis: Muscle weakness (generalized) (M62.81);Other symptoms and signs involving the nervous system (R29.898);Difficulty in walking, not elsewhere classified (R26.2);Pain Pain - part of body:  (back)     Time: 8287-8264 PT Time Calculation (min) (ACUTE ONLY): 23 min  Charges:    $Therapeutic Exercise: 8-22 mins $Therapeutic Activity: 8-22 mins PT General Charges $$ ACUTE PT VISIT: 1 Visit                     Danetta Prom P., PTA Acute Rehabilitation Services Secure Chat Preferred 9a-5:30pm Office: [phone]    Connell HERO [facility] 10/07/2023,  6:04 PM

## 2023-10-07 NOTE — Plan of Care (Signed)
   Problem: Activity: Goal: Risk for activity intolerance will decrease Outcome: Progressing   Problem: Nutrition: Goal: Adequate nutrition will be maintained Outcome: Progressing   Problem: Safety: Goal: Ability to remain free from injury will improve Outcome: Progressing

## 2023-10-07 NOTE — Patient Instructions (Signed)
 Austin Weaver

## 2023-10-07 NOTE — Progress Notes (Signed)
 PROGRESS NOTE  Austin Weaver FMW:[id number] DOB: Mar 16, 1946 DOA: 09/21/2023 PCP: Corwin Antu, FNP   LOS: 15 days   Brief Narrative / Interim history: 78 year old with mild dementia, HTN, prior CVA was a long-term SNF patient did [facility], but at baseline able to ambulate with a walker, the day prior to admission felt weak, legs were about to give out and was unable to walk.  He was brought to the ER and an MRI showed a large contrast-enhancing lesion involving the T8 vertebral body worrisome for malignancy -lymphoma versus metastasis. There is epidural spread of tumor resulting in severe spinal canal stenosis with compression of the spinal cord at the T8 level, and additional smaller contrast-enhancing lesions in the T10, T12, and S1 vertebral bodies, concerning for additional sites of metastatic disease.  Subjective / 24h Interval events: He has no complaints, tells me his legs are not improved, does not feel them and cannot move them  Assesement and Plan: Principal problem Acute paraplegia, vertebral body mass -this is primary malignancy versus metastasis.  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 Case was discussed on admission with neurosurgery on admission, decompression unlikely to be successful given age and comorbidities.  -I have discussed case with oncology, Dr. Loretha as well as radiation oncology, Dr. Shannon.  Biopsy may be of benefit, and there are options for this to be potentially reversible.  While underlying illness is not curable, attempting to restore patient's ambulation is not unreasonable.  Radiation oncology suggested a trial of Decadron  which was started 12/30, and if there is no improvement within 1 to 2 days, there is no role for radiation therapy.  Unfortunately last steroid dose was this morning 1/1, patient  remains without any improvement.  Have discussed again with the oncology team but at this point since this appears to be a reversible will plan to return to SNF.  Biopsy was done on 12/31 and it is still pending, and this can be followed up as an outpatient  Active problems UTI, acute urinary retention, hematuria-Foley catheter is in place.  Started on ceftriaxone  12/26, no culture sent.  Status post completed course on 12/31  AKI-creatinine normalized with fluids  Hypertension-blood pressure acceptable, hold home agents due to AKI  Hyperlipidemia-Lipitor   Dementia-alert to self, place, knows why he is here.  He does not know the year.  He tells me that prior to being here he would walk with a walker and sometimes in the wheelchair, and was able to socialize and had friends in his nursing home.  Leukocytosis-due to steroids  Hiccups-on Thorazine    Scheduled Meds:  acetaminophen   1,000 mg Oral TID   atorvastatin   40 mg Oral QHS   Chlorhexidine  Gluconate Cloth  6 each Topical Daily   enoxaparin  (LOVENOX ) injection  40 mg Subcutaneous Q24H   lactulose   300 mL Rectal QODAY   sodium zirconium cyclosilicate   10 g Oral Once   Continuous Infusions:   PRN Meds:.albuterol , bisacodyl , morphine  injection, oxyCODONE , polyethylene glycol  Current Outpatient Medications  Medication Instructions   acetaminophen  (TYLENOL ) 650 mg, Oral, Every 6 hours PRN   amLODipine  (NORVASC ) 10 mg, Oral, Daily   aspirin  EC 81 mg, Oral, Daily, Swallow whole.   atorvastatin  (LIPITOR ) 40 mg, Oral, Daily   fluticasone  (FLONASE ) 50 MCG/ACT nasal spray 2 sprays, Each Nare, Daily   hydrochlorothiazide  (HYDRODIURIL ) 12.5 mg, Oral, Daily  Vitamin D (Ergocalciferol) (DRISDOL) 50,000 Units, Oral, Every 7 days, Every Tuesday    Diet Orders (From admission, onward)     Start     Ordered   10/06/23 1013  Diet regular Room service appropriate? Yes; Fluid consistency: Thin  Diet effective now       Question Answer  Comment  Room service appropriate? Yes   Fluid consistency: Thin      10/06/23 1012            DVT prophylaxis: enoxaparin  (LOVENOX ) injection 40 mg Start: 09/21/23 2200   Lab Results  Component Value Date   PLT 269 10/07/2023      Code Status: Full Code  Family Communication: no family at bedside   Status is: Inpatient Remains inpatient appropriate because: severity of illness  Level of care: Med-Surg  Consultants:  Palliative  Objective: Vitals:   10/06/23 1513 10/06/23 2056 10/07/23 0414 10/07/23 0823  BP: 127/63 136/64 139/67 123/72  Pulse: 65 77 72 68  Resp: 18 18 18 18   Temp: 98.1 F (36.7 C) 98.4 F (36.9 C) 97.8 F (36.6 C) 97.9 F (36.6 C)  TempSrc: Oral Oral Oral Oral  SpO2: 99% 100% 100% 100%    Intake/Output Summary (Last 24 hours) at 10/07/2023 1000 Last data filed at 10/07/2023 0732 Gross per 24 hour  Intake 957 ml  Output 1700 ml  Net -743 ml   Wt Readings from Last 3 Encounters:  05/18/23 72.8 kg  02/20/23 73.7 kg  07/11/22 78.2 kg    Examination:  Constitutional: NAD Eyes: lids and conjunctivae normal, no scleral icterus ENMT: mmm Neck: normal, supple Respiratory: clear to auscultation bilaterally, no wheezing, no crackles. Normal respiratory effort.  Cardiovascular: Regular rate and rhythm, no murmurs / rubs / gallops. No LE edema. Abdomen: soft, no distention, no tenderness. Bowel sounds positive.    Data Reviewed: I have independently reviewed following labs and imaging studies   CBC Recent Labs  Lab 10/02/23 0539 10/04/23 0445 10/07/23 0747  WBC 12.9* 9.8 26.7*  HGB 15.1 14.0 13.9  HCT 43.2 40.6 40.9  PLT 291 286 269  MCV 85.7 87.1 87.4  MCH 30.0 30.0 29.7  MCHC 35.0 34.5 34.0  RDW 14.5 14.6 14.6  LYMPHSABS 1.6 2.1  --   MONOABS 1.3* 1.0  --   EOSABS 0.1 0.2  --   BASOSABS 0.1 0.1  --     Recent Labs  Lab 10/02/23 0539 10/04/23 0445 10/05/23 1512 10/07/23 0747  NA 132* 137  --  133*  K 3.8 4.1  --   5.4*  CL 93* 106  --  103  CO2 27 22  --  22  GLUCOSE 121* 126*  --  136*  BUN 77* 39*  --  19  CREATININE 1.73* 1.11  --  0.81  CALCIUM  8.9 8.4*  --  8.7*  AST  --   --   --  34  ALT  --   --   --  26  ALKPHOS  --   --   --  71  BILITOT  --   --   --  0.5  ALBUMIN  --   --   --  2.4*  MG  --   --   --  2.1  INR  --   --  1.1  --     ------------------------------------------------------------------------------------------------------------------ No results for input(s): CHOL, HDL, LDLCALC, TRIG, CHOLHDL, LDLDIRECT in the last 72 hours.  Lab Results  Component Value  Date   HGBA1C 5.3 02/20/2023   ------------------------------------------------------------------------------------------------------------------ No results for input(s): TSH, T4TOTAL, T3FREE, THYROIDAB in the last 72 hours.  Invalid input(s): FREET3  Cardiac Enzymes No results for input(s): CKMB, TROPONINI, MYOGLOBIN in the last 168 hours.  Invalid input(s): CK ------------------------------------------------------------------------------------------------------------------ No results found for: BNP  CBG: No results for input(s): GLUCAP in the last 168 hours.  No results found for this or any previous visit (from the past 240 hours).   Radiology Studies: No results found.   Nilda Fendt, MD, PhD [facility]  Between 7 am - 7 pm I am available, please contact me via Amion (for emergencies) or Securechat (non urgent messages)  Between 7 pm - 7 am I am not available, please contact night coverage MD/APP via Amion

## 2023-10-07 NOTE — Progress Notes (Signed)
 Daily Progress Note   Patient Name: Austin Weaver       Date: 10/07/2023 DOB: 1946/06/18  Age: 78 y.o. MRN#: [id number] Attending Physician: Trixie Nilda HERO, MD Primary Care Physician: Corwin Antu, FNP Admit Date: 09/21/2023 Length of Stay: 15 days  Reason for Consultation/Follow-up: Establishing goals of care  HPI/Patient Profile:  78 y.o. male  with past medical history of  dementia, HTN, stroke who is a long-term nursing home patient at [facility]. At baseline, patient is able to ambulate.  Presented to the emergency department by EMS for leg weakness.  He was admitted on 09/21/2023 with acute paraplegia due to large spinal malignancy, and others.    Palliative medicine was consulted for GOC conversations.  Of note the patient has a legal guardian.  Subjective:   Subjective: Chart Reviewed. Updates received. Patient Assessed. Created space and opportunity for patient  and family to explore thoughts and feelings regarding current medical situation.  Today's Discussion: Today saw the patient at the bedside, no family, guardian, or staff present.  He is awake, alert, and pleasant.  He is sitting up in the bed and he greets me warmly as I enter.  His lunch tray is at the bedside table.  I offered to help set it up for him but he states he usually eats around 230 or 3:00.  He states the nurse will be coming to help him at that time.  I asked him if he is having any pain and he denies pain, nausea, vomiting.  He states that overall he feels better today.  Before entering the room I did review the chart extensively.  Read hospice notes, check for labs.  Pathology is still pending.  Further goals of care will be possible after pathology comes back and oncology is able to weigh in.  I provided emotional and general support through therapeutic listening, empathy, sharing of stories, therapeutic touch, and other techniques. I answered all questions and addressed all concerns to the best of my  ability.  Review of Systems  Constitutional:        Denies pain in general  Respiratory:  Negative for shortness of breath.   Cardiovascular:  Negative for chest pain.  Gastrointestinal:  Negative for abdominal pain, nausea and vomiting.    Objective:   Vital Signs:  BP 123/72 (BP Location: Left Arm)   Pulse 68   Temp 97.9 F (36.6 C) (Oral)   Resp 18   SpO2 100%   Physical Exam Vitals and nursing note reviewed.  Constitutional:      General: He is not in acute distress.    Appearance: He is ill-appearing.  HENT:     Head: Normocephalic and atraumatic.  Cardiovascular:     Rate and Rhythm: Normal rate.  Pulmonary:     Effort: Pulmonary effort is normal. No respiratory distress.     Breath sounds: No wheezing or rhonchi.  Abdominal:     General: Abdomen is flat. Bowel sounds are normal. There is no distension.     Palpations: Abdomen is soft.  Skin:    General: Skin is warm and dry.  Neurological:     Mental Status: He is alert.     Comments: Some stuttering, short-term memory loss consistent with chronic diagnosis  Psychiatric:        Mood and Affect: Mood normal.        Behavior: Behavior normal.     Palliative Assessment/Data: 30%    Existing Vynca/ACP Documentation: Advance directive signed 04/01/2022  Assessment & Plan:   Impression: Present on Admission: . Acute paraplegia [facility])  78 year old male with acute presentation chronic comorbidities as described above. The patient has a legal guardian and, regardless, I am not sure he can understand his current clinical situation. He was told about the tumor by the emergency room provider, however he does not remember this. He notes some lower back pain, consistent with his large lumbar tumor, but no other pain, nausea, vomiting.  We were able to reach out to the guardian and provided official medical recommendations for ongoing care and subsequently biopsy was completed.  We are awaiting pathology results and  oncology opinion for possible options that we can discussed with legal guardian/DSS.  Overall prognosis poor.   SUMMARY OF RECOMMENDATIONS   Remain full code Continue full scope of care Awaiting pathology results and oncology recommendation/treatment options  Palliative medicine will continue to follow peripherally for next steps and needs Will continue attempts to have GOC conversations with guardian when treatment options are known Please notify us  of any significant clinical change or urgent palliative needs in the meantime  Symptom Management:  Per primary team PMT is available to assist as needed  Code Status: Full code  Prognosis: Unable to determine  Discharge Planning: To Be Determined  Discussed with: Patient, medical team, nursing team  Thank you for allowing us  to participate in the care of Lelend Heinecke PMT will continue to support holistically.  Time Total: 25 min  Detailed review of medical records (labs, imaging, vital signs), medically appropriate exam, discussed with treatment team, counseling and education to patient, family, & staff, documenting clinical information, medication management, coordination of care  Camellia Kays, NP Palliative Medicine Team  Team Phone # [phone] (Nights/Weekends)  06/04/2021, 8:17 AM

## 2023-10-07 NOTE — Progress Notes (Signed)
 Occupational Therapy Treatment Patient Details Name: Austin Weaver MRN: [id number] DOB: 1946/01/13 Today's Date: 10/07/2023   History of present illness Pt is a 78 y/o male who presents 09/22/2023 with LE weakness, numbness, and inability to walk. Pt is from [facility] nursing facility where he is a long term resident. MRI revealed T8 vertebral body lesion concerning for malignancy - lymphoma vs metastasis, with additional smaller lesions at T10, T12, S1 vertebral bodies. Neurosurgery consulted, pt is not a surgical candidate. PMH significant for CVA/TIA, HTN, laporotomy.   OT comments  Pt in bed upon therapy arrival. With encouragement, pt participated in OT treatment session. Focused session on BUE exercises, proper bed positioning assist, pt education, and reviewing therapy goals and overall progress in therapy. Total assist provided for proper positioning assist with use of pillows. Pt tends to favor left side and requested pillow to provide lateral support. Bilateral heels elevated off bed. Recommend PRAFO boots and pressure relief air mattress due to lack of sensation and strength from the waist down which makes pt a high risk for pressure sores. Discussed recommendation with PTA. Patient will benefit from continued inpatient follow up therapy, <3 hours/day. OT will continue to follow patient acutely.        If plan is discharge home, recommend the following:  Two people to help with walking and/or transfers;Two people to help with bathing/dressing/bathroom   Equipment Recommendations  Other (comment) (defer to next level of care)       Precautions / Restrictions Precautions Precautions: Fall;Other (comment) Precaution Comments: paraplegia Restrictions Weight Bearing Restrictions Per Provider Order: No       Mobility Bed Mobility  General bed mobility comments: Bed placed in trendelenburg, pt grasped headboard with both hands and assisted with boosting up towards HOB. OT  assisted with using bed pad. Pt declined sitting on EOB.       Balance Overall balance assessment: Needs assistance     Sitting balance - Comments: Decreased core strength noted while in bed with HOB elevated 20*+. Pt demonstrates preference towards left side leaning. Requires external support for positioning and lateral/trunk support. Postural control: Left lateral lean      ADL either performed or assessed with clinical judgement    Extremity/Trunk Assessment Upper Extremity Assessment Upper Extremity Assessment: Right hand dominant;RUE deficits/detail;LUE deficits/detail RUE Deficits / Details: 3/5 throughout. Weak gross grasp although able to grab headboard and assist with boosting in bed. RUE Coordination: decreased fine motor;decreased gross motor LUE Deficits / Details: 3/5 throughout. Weak gross grasp although able to grab headboard and assist with boosting in bed. LUE Coordination: decreased fine motor;decreased gross motor             Cognition Arousal: Alert Behavior During Therapy: WFL for tasks assessed/performed Overall Cognitive Status: Impaired/Different from baseline Area of Impairment: Safety/judgement        Safety/Judgement: Decreased awareness of deficits              Exercises General Exercises - Upper Extremity Shoulder Horizontal ABduction: AROM, Both, 5 reps, Supine Shoulder Horizontal ADduction: AROM, Both, 5 reps, Supine Shoulder Exercises Shoulder Flexion: AAROM, AROM, Left, Right, Both, 5 reps, Supine Other Exercises Other Exercises: supine, BUE, forward punch, 5X each arm, 1 set.       General Comments VSS on RA    Pertinent Vitals/ Pain       Pain Assessment Pain Assessment: Faces Faces Pain Scale: Hurts even more Pain Location: back when HOB was flattened for boosting pt. Pain Descriptors /  Indicators: Grimacing Pain Intervention(s): Repositioned, Monitored during session         Frequency  Min 1X/week         Progress Toward Goals  OT Goals(current goals can now be found in the care plan section)  Progress towards OT goals: Progressing toward goals  Acute Rehab OT Goals Time For Goal Achievement: 10/21/23 Potential to Achieve Goals: Fair  Plan         AM-PAC OT 6 Clicks Daily Activity     Outcome Measure   Help from another person eating meals?: A Little (requires assist for positioning in bed and set-up of tray) Help from another person taking care of personal grooming?: A Little Help from another person toileting, which includes using toliet, bedpan, or urinal?: Total Help from another person bathing (including washing, rinsing, drying)?: A Lot Help from another person to put on and taking off regular upper body clothing?: A Lot Help from another person to put on and taking off regular lower body clothing?: Total 6 Click Score: 12    End of Session    OT Visit Diagnosis: Other abnormalities of gait and mobility (R26.89);Muscle weakness (generalized) (M62.81);Pain;Other symptoms and signs involving cognitive function Pain - part of body:  (back)   Activity Tolerance Patient tolerated treatment well   Patient Left in bed;with call bell/phone within reach;with bed alarm set           Time: 1321-1406 OT Time Calculation (min): 45 min  Charges: OT General Charges $OT Visit: 1 Visit OT Treatments $Therapeutic Activity: 8-22 mins $Therapeutic Exercise: 23-37 mins  Leita Howell, OTR/L,CBIS  Supplemental OT - [facility] and [facility] Secure Chat Preferred    Mazal Ebey, Leita BIRCH 10/07/2023, 2:21 PM

## 2023-10-08 DIAGNOSIS — G8220 Paraplegia, unspecified: Secondary | ICD-10-CM | POA: Diagnosis not present

## 2023-10-08 MED ORDER — BISACODYL 5 MG PO TBEC: 5.0000 mg | DELAYED_RELEASE_TABLET | Freq: Every day | ORAL | Status: DC | PRN

## 2023-10-08 NOTE — Progress Notes (Signed)
 [facility] Liaison Note  Notified by Cathlyn Ferry, LCSW of request for [facility] Palliative services at discharge.  Referral received and will follow at [facility].  Please call with any questions or concerns.  Thank you, Randine Nail, BSN, [facility] [phone]

## 2023-10-08 NOTE — TOC Transition Note (Signed)
 Transition of Care [facility]) - Discharge Note   Patient Details  Name: Austin Weaver MRN: [id number] Date of Birth: [birth year]  Transition of [facility]) CM/SW Contact:  Bridget Cordella Simmonds, LCSW Phone Number: 10/08/2023, 11:08 AM   Clinical Narrative:   Pt discharging to [place], room 104.  RN call report to [phone].    Final next level of care: Skilled Nursing Facility Barriers to Discharge: Barriers Resolved   Patient Goals and CMS Choice            Discharge Placement              Patient chooses bed at: [facility] Patient to be transferred to facility by: ptar Name of family member notified: [facility] DSS/Heather Vinie, social worker/legal guardian Patient and family notified of of transfer: 10/08/23  Discharge Plan and Services Additional resources added to the After Visit Summary for       Post Acute Care Choice: Skilled Nursing Facility                               Social Drivers of Health (SDOH) Interventions SDOH Screenings   Food Insecurity: Patient Unable To Answer (09/24/2023)  Housing: Patient Unable To Answer (09/24/2023)  Transportation Needs: Patient Unable To Answer (09/24/2023)  Utilities: Not At Risk (09/24/2023)  Depression (PHQ2-9): Low Risk  (07/11/2022)  Financial Resource Strain: Medium Risk (12/01/2018)  Social Connections: Unknown (10/06/2023)  Tobacco Use: High Risk (10/05/2023)     Readmission Risk Interventions     No data to display

## 2023-10-08 NOTE — Discharge Summary (Signed)
 Physician Discharge Summary  Austin Weaver FMW:[id number] DOB: 02/29/1946 DOA: 09/21/2023  PCP: Corwin Antu, FNP  Admit date: 09/21/2023 Discharge date: 10/08/2023  Admitted From: long term SNF Disposition:  long term SNF  Recommendations for Outpatient Follow-up:  Follow up with PCP in 1-2 weeks Please obtain BMP/CBC in one week Please follow up on the following pending results: Spinal mass biopsy Continue Foley on discharge Needs palliative care follow-up at SNF  Home Health: None Equipment/Devices: None  Discharge Condition: Stable CODE STATUS: Full code Diet Orders (From admission, onward)     Start     Ordered   10/06/23 1013  Diet regular Room service appropriate? Yes; Fluid consistency: Thin  Diet effective now       Question Answer Comment  Room service appropriate? Yes   Fluid consistency: Thin      10/06/23 1012            Brief Narrative / Interim history: 78 year old with mild dementia, HTN, prior CVA was a long-term SNF patient did [facility], but at baseline able to ambulate with a walker, the day prior to admission felt weak, legs were about to give out and was unable to walk.  He was brought to the ER and an MRI showed a large contrast-enhancing lesion involving the T8 vertebral body worrisome for malignancy -lymphoma versus metastasis. There is epidural spread of tumor resulting in severe spinal canal stenosis with compression of the spinal cord at the T8 level, and additional smaller contrast-enhancing lesions in the T10, T12, and S1 vertebral bodies, concerning for additional sites of metastatic disease.  Hospital Course / Discharge diagnoses: Principal problem Acute paraplegia, vertebral body mass -this is primary malignancy versus metastasis.  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 Case  was discussed on admission with neurosurgery, decompression unlikely to be successful given age and comorbidities. I have discussed case with oncology, Dr. Loretha as well as radiation oncology, Dr. Shannon.  Biopsy may be of benefit, and there are options for this to be potentially reversible, and patient underwent biopsy on 12/31, results pending at the time of discharge.  Radiation oncology suggested a trial of Decadron  which was started 12/30, and if there is no improvement within 1 to 2 days, there is no role for radiation therapy.  Unfortunately after receiving high-dose steroids, patient had no motor improvement, remains flaccid.  Discussed again with radiation oncology, this is felt to be a reversible.  Active problems UTI, acute urinary retention, hematuria-Foley catheter is in place.  He has completed a course of antibiotics while hospitalized  AKI-creatinine normalized with fluids Hypertension-blood pressure acceptable, hold home agents due to AKI Hyperlipidemia-Lipitor  Dementia-alert to self, place, knows why he is here.  He does not know the year.  He tells me that prior to being here he would walk with a walker and sometimes in the wheelchair, and was able to socialize and had friends in his nursing home. Leukocytosis-due to steroids Hiccups-on Thorazine  Goals of care-myself along with Dr. Marvine with palliative medicine have recommended patient be DNR given incurable illness as above.  Paperwork was submitted to DSS, this will need to be followed up as an outpatient.  Recommend ongoing palliative care discussions, biopsy results are still pending and oncology aware, but I suspect he will need to get established with hospice soon.  Sepsis ruled out   Discharge  Instructions   Allergies as of 10/08/2023   No Known Allergies      Medication List     TAKE these medications    acetaminophen  325 MG tablet Commonly known as: TYLENOL  Take 650 mg by mouth every 6 (six) hours as needed  for mild pain (pain score 1-3) or moderate pain (pain score 4-6).   amLODipine  10 MG tablet Commonly known as: NORVASC  Take 1 tablet (10 mg total) by mouth daily.   aspirin  EC 81 MG tablet Take 81 mg by mouth daily. Swallow whole.   atorvastatin  40 MG tablet Commonly known as: LIPITOR  Take 1 tablet (40 mg total) by mouth daily. What changed: when to take this   bisacodyl  5 MG EC tablet Commonly known as: DULCOLAX Take 1 tablet (5 mg total) by mouth daily as needed for moderate constipation.   fluticasone  50 MCG/ACT nasal spray Commonly known as: FLONASE  Place 2 sprays into both nostrils daily for 6 days.   hydrochlorothiazide  12.5 MG tablet Commonly known as: HYDRODIURIL  Take 12.5 mg by mouth daily.   Vitamin D (Ergocalciferol) 1.25 MG (50000 UNIT) Caps capsule Commonly known as: DRISDOL Take 50,000 Units by mouth every 7 (seven) days. Every Tuesday        Contact information for after-discharge care     Destination     [facility] .   Service: Skilled Nursing Contact information: [address] [place]  [phone]                     Consultations: Palliative IR  Procedures/Studies:  IR Fluoro Guide Ndl Plmt / BX Result Date: 10/06/2023 INDICATION: Pathologic fracture. EXAM: FLUOROSCOPY GUIDED THORACIC-8 VERTEBRAL BODY CORE BONE BIOPSY MEDICATIONS: None. ANESTHESIA/SEDATION: Moderate (conscious) sedation was employed during this procedure. A total of Versed  0.5 mg and Fentanyl  75 mcg was administered intravenously by the radiology nurse. Total intra-service moderate Sedation Time: 19 minutes. The patient's level of consciousness and vital signs were monitored continuously by radiology nursing throughout the procedure under my direct supervision. Fluoroscopy dose: 195 mGy air kerma. COMPLICATIONS: None immediate. PROCEDURE: Informed written consent was obtained from the patient after a thorough discussion of the  procedural risks, benefits and alternatives. All questions were addressed. Maximal Sterile Barrier Technique was utilized including caps, mask, sterile gowns, sterile gloves, sterile drape, hand hygiene and skin antiseptic. A timeout was performed prior to the initiation of the procedure. The patient was placed in prone position on the angiography table. The thoracic spine region was prepped and draped in a sterile fashion. Under fluoroscopy, the T8 vertebral body was delineated and the skin area was marked. The skin was infiltrated with a 1% Lidocaine  approximately 2 cm lateral to the spinous process projection on the right. Using a 22-gauge spinal needle, the soft issue and the peripedicular space and periosteum were infiltrated with Bupivacaine  0.5%. A skin incision was made at the access site. Subsequently, an 11-gauge Kyphon trocar was inserted under fluoroscopic guidance until contact with the pedicle was obtained. The trocar was inserted under light hammer tapping into the pedicle until the posterior boundaries of the vertebral body was reached. The diamond mandrill was removed and 2 core bone biopsy samples were obtained. The trocar was later removed. The access site was cleaned and covered with a sterile bandage. IMPRESSION: Successful T8 fluoroscopy guided core bone biopsy. Samples obtained were sent for tissue exam. Electronically Signed   By: Curtis everitt Nile Lizzie M.D.   On: 10/06/2023 12:42  MR THORACIC SPINE W WO CONTRAST Result Date: 09/21/2023 CLINICAL DATA:  Mid-back pain, neuro deficit Trunk numbness or tingling no sensation below t10; Myelopathy, acute, lumbar spine no sensation below t10 EXAM: MRI THORACIC AND LUMBAR SPINE WITHOUT AND WITH CONTRAST TECHNIQUE: Multiplanar and multiecho pulse sequences of the thoracic and lumbar spine were obtained without and with intravenous contrast. CONTRAST:  7mL GADAVIST  GADOBUTROL  1 MMOL/ML IV SOLN COMPARISON:  None Available. FINDINGS: MRI  THORACIC SPINE FINDINGS Alignment:  Physiologic. Vertebrae: There is a large contrast-enhancing lesion involving the anterior and posterior aspect of the T8 vertebral body as well as the posterior elements. There is epidural spread of tumor resulting in severe spinal canal stenosis of the T8 level with compression of the spinal cord. There is an additional separate contrast-enhancing lesion in the T10 vertebral body (series 20, image 10) in the T12 vertebral body (series 18, image 6). Cord: No cord signal abnormality. There is severe spinal canal stenosis with mass effect on the spinal cord at the T8 level (series 8, image 7) Paraspinal and other soft tissues: Negative. Disc levels: Severe spinal canal stenosis with flattening of the spinal cord at the T8 level. There is epidural spread of tumor in the ventral and dorsal epidural space at the T8 level, extending superiorly to the T6 level (series 20, image 14) and inferiorly to involve the T9 level (series 20, image 12). MRI LUMBAR SPINE FINDINGS Segmentation:  Standard. Alignment:  Physiologic. Vertebrae: No fracture, evidence of discitis. There is a small contrast-enhancing lesion in the S1 vertebral body (series 18, image 8). There are chronic superior endplate compression deformities at L1, L2, and L3. Conus medullaris: Extends to the L1-L2 disc space level and appears normal. Paraspinal and other soft tissues: Bilateral T2 hyperintense renal lesions are favored to represent simple renal cysts requiring no further imaging workup Disc levels: T12-L1: Mild bilateral facet degenerative change. No spinal canal or neural foraminal narrowing. L1-L2: Mild bilateral facet degenerative change. Minimal disc bulge. Mild spinal canal narrowing. No neural foraminal narrowing. L2-L3: Mild bilateral facet degenerative change. Small disc bulge. Mild spinal canal narrowing. Mild bilateral neural foraminal narrowing. L3-L4: Moderate bilateral facet degenerative change.  Circumferential disc bulge. Mild-to-moderate spinal canal narrowing. Mild-to-moderate bilateral neural foraminal narrowing. L4-L5: Mild bilateral facet degenerative change. Circumferential disc bulge there is narrowing of the right lateral recess. Moderate right and mild left neural foraminal narrowing. Mild overall spinal canal narrowing. L5-S1: Severe disc space loss. Mild bilateral facet degenerative change. No spinal canal narrowing. Mild-to-moderate bilateral neural foraminal narrowing. IMPRESSION: 1. Large contrast-enhancing lesion involving the T8 vertebral body is worrisome for malignancy. Differential considerations include lymphoma or a metastatic lesion. There is epidural spread of tumor resulting in severe spinal canal stenosis with compression of the spinal cord at the T8 level. 2. Additional smaller contrast-enhancing lesions in the T10, T12, and S1 vertebral bodies, concerning for additional sites of metastatic disease. Findings were discussed with Dr. Jerral on 09/21/23 at 5:33 PM. Electronically Signed   By: Lyndall Gore M.D.   On: 09/21/2023 17:37   MR Lumbar Spine W Wo Contrast Result Date: 09/21/2023 CLINICAL DATA:  Mid-back pain, neuro deficit Trunk numbness or tingling no sensation below t10; Myelopathy, acute, lumbar spine no sensation below t10 EXAM: MRI THORACIC AND LUMBAR SPINE WITHOUT AND WITH CONTRAST TECHNIQUE: Multiplanar and multiecho pulse sequences of the thoracic and lumbar spine were obtained without and with intravenous contrast. CONTRAST:  7mL GADAVIST  GADOBUTROL  1 MMOL/ML IV SOLN COMPARISON:  None Available.  FINDINGS: MRI THORACIC SPINE FINDINGS Alignment:  Physiologic. Vertebrae: There is a large contrast-enhancing lesion involving the anterior and posterior aspect of the T8 vertebral body as well as the posterior elements. There is epidural spread of tumor resulting in severe spinal canal stenosis of the T8 level with compression of the spinal cord. There is an  additional separate contrast-enhancing lesion in the T10 vertebral body (series 20, image 10) in the T12 vertebral body (series 18, image 6). Cord: No cord signal abnormality. There is severe spinal canal stenosis with mass effect on the spinal cord at the T8 level (series 8, image 7) Paraspinal and other soft tissues: Negative. Disc levels: Severe spinal canal stenosis with flattening of the spinal cord at the T8 level. There is epidural spread of tumor in the ventral and dorsal epidural space at the T8 level, extending superiorly to the T6 level (series 20, image 14) and inferiorly to involve the T9 level (series 20, image 12). MRI LUMBAR SPINE FINDINGS Segmentation:  Standard. Alignment:  Physiologic. Vertebrae: No fracture, evidence of discitis. There is a small contrast-enhancing lesion in the S1 vertebral body (series 18, image 8). There are chronic superior endplate compression deformities at L1, L2, and L3. Conus medullaris: Extends to the L1-L2 disc space level and appears normal. Paraspinal and other soft tissues: Bilateral T2 hyperintense renal lesions are favored to represent simple renal cysts requiring no further imaging workup Disc levels: T12-L1: Mild bilateral facet degenerative change. No spinal canal or neural foraminal narrowing. L1-L2: Mild bilateral facet degenerative change. Minimal disc bulge. Mild spinal canal narrowing. No neural foraminal narrowing. L2-L3: Mild bilateral facet degenerative change. Small disc bulge. Mild spinal canal narrowing. Mild bilateral neural foraminal narrowing. L3-L4: Moderate bilateral facet degenerative change. Circumferential disc bulge. Mild-to-moderate spinal canal narrowing. Mild-to-moderate bilateral neural foraminal narrowing. L4-L5: Mild bilateral facet degenerative change. Circumferential disc bulge there is narrowing of the right lateral recess. Moderate right and mild left neural foraminal narrowing. Mild overall spinal canal narrowing. L5-S1: Severe  disc space loss. Mild bilateral facet degenerative change. No spinal canal narrowing. Mild-to-moderate bilateral neural foraminal narrowing. IMPRESSION: 1. Large contrast-enhancing lesion involving the T8 vertebral body is worrisome for malignancy. Differential considerations include lymphoma or a metastatic lesion. There is epidural spread of tumor resulting in severe spinal canal stenosis with compression of the spinal cord at the T8 level. 2. Additional smaller contrast-enhancing lesions in the T10, T12, and S1 vertebral bodies, concerning for additional sites of metastatic disease. Findings were discussed with Dr. Jerral on 09/21/23 at 5:33 PM. Electronically Signed   By: Lyndall Gore M.D.   On: 09/21/2023 17:37     Subjective: - no chest pain, shortness of breath, no abdominal pain, nausea or vomiting.   Discharge Exam: BP 127/77 (BP Location: Right Arm)   Pulse (!) 113   Temp 98.1 F (36.7 C) (Oral)   Resp 18   SpO2 100%   General: Pt is alert, awake, not in acute distress Cardiovascular: RRR, S1/S2 +, no rubs, no gallops Respiratory: CTA bilaterally, no wheezing, no rhonchi Abdominal: Soft, NT, ND, bowel sounds + Extremities: no edema, no cyanosis    The results of significant diagnostics from this hospitalization (including imaging, microbiology, ancillary and laboratory) are listed below for reference.     Microbiology: No results found for this or any previous visit (from the past 240 hours).   Labs: Basic Metabolic Panel: Recent Labs  Lab 10/02/23 0539 10/04/23 0445 10/07/23 0747 10/07/23 1040  NA 132* 137  133*  --   K 3.8 4.1 5.4*  --   CL 93* 106 103  --   CO2 27 22 22   --   GLUCOSE 121* 126* 136*  --   BUN 77* 39* 19  --   CREATININE 1.73* 1.11 0.81  --   CALCIUM  8.9 8.4* 8.7*  --   MG  --   --  2.1  --   PHOS  --   --   --  2.8   Liver Function Tests: Recent Labs  Lab 10/07/23 0747  AST 34  ALT 26  ALKPHOS 71  BILITOT 0.5  PROT 6.4*   ALBUMIN 2.4*   CBC: Recent Labs  Lab 10/02/23 0539 10/04/23 0445 10/07/23 0747  WBC 12.9* 9.8 26.7*  NEUTROABS 9.6* 5.9  --   HGB 15.1 14.0 13.9  HCT 43.2 40.6 40.9  MCV 85.7 87.1 87.4  PLT 291 286 269   CBG: No results for input(s): GLUCAP in the last 168 hours. Hgb A1c No results for input(s): HGBA1C in the last 72 hours. Lipid Profile No results for input(s): CHOL, HDL, LDLCALC, TRIG, CHOLHDL, LDLDIRECT in the last 72 hours. Thyroid  function studies No results for input(s): TSH, T4TOTAL, T3FREE, THYROIDAB in the last 72 hours.  Invalid input(s): FREET3 Urinalysis    Component Value Date/Time   COLORURINE RED (A) 10/01/2023 1235   APPEARANCEUR TURBID (A) 10/01/2023 1235   LABSPEC  10/01/2023 1235    TEST NOT REPORTED DUE TO COLOR INTERFERENCE OF URINE PIGMENT   PHURINE  10/01/2023 1235    TEST NOT REPORTED DUE TO COLOR INTERFERENCE OF URINE PIGMENT   GLUCOSEU (A) 10/01/2023 1235    TEST NOT REPORTED DUE TO COLOR INTERFERENCE OF URINE PIGMENT   HGBUR (A) 10/01/2023 1235    TEST NOT REPORTED DUE TO COLOR INTERFERENCE OF URINE PIGMENT   BILIRUBINUR (A) 10/01/2023 1235    TEST NOT REPORTED DUE TO COLOR INTERFERENCE OF URINE PIGMENT   KETONESUR (A) 10/01/2023 1235    TEST NOT REPORTED DUE TO COLOR INTERFERENCE OF URINE PIGMENT   PROTEINUR (A) 10/01/2023 1235    TEST NOT REPORTED DUE TO COLOR INTERFERENCE OF URINE PIGMENT   NITRITE (A) 10/01/2023 1235    TEST NOT REPORTED DUE TO COLOR INTERFERENCE OF URINE PIGMENT   LEUKOCYTESUR (A) 10/01/2023 1235    TEST NOT REPORTED DUE TO COLOR INTERFERENCE OF URINE PIGMENT    FURTHER DISCHARGE INSTRUCTIONS:   Get Medicines reviewed and adjusted: Please take all your medications with you for your next visit with your Primary MD   Laboratory/radiological data: Please request your Primary MD to go over all hospital tests and procedure/radiological results at the follow up, please ask your Primary MD to  get all Hospital records sent to his/her office.   In some cases, they will be blood work, cultures and biopsy results pending at the time of your discharge. Please request that your primary care M.D. goes through all the records of your hospital data and follows up on these results.   Also Note the following: If you experience worsening of your admission symptoms, develop shortness of breath, life threatening emergency, suicidal or homicidal thoughts you must seek medical attention immediately by calling 911 or calling your MD immediately  if symptoms less severe.   You must read complete instructions/literature along with all the possible adverse reactions/side effects for all the Medicines you take and that have been prescribed to you. Take any new Medicines after you have  completely understood and accpet all the possible adverse reactions/side effects.    Do not drive when taking Pain medications or sleeping medications (Benzodaizepines)   Do not take more than prescribed Pain, Sleep and Anxiety Medications. It is not advisable to combine anxiety,sleep and pain medications without talking with your primary care practitioner   Special Instructions: If you have smoked or chewed Tobacco  in the last 2 yrs please stop smoking, stop any regular Alcohol  and or any Recreational drug use.   Wear Seat belts while driving.   Please note: You were cared for by a hospitalist during your hospital stay. Once you are discharged, your primary care physician will handle any further medical issues. Please note that NO REFILLS for any discharge medications will be authorized once you are discharged, as it is imperative that you return to your primary care physician (or establish a relationship with a primary care physician if you do not have one) for your post hospital discharge needs so that they can reassess your need for medications and monitor your lab values.  Time coordinating discharge: 35  minutes  SIGNED:  Nilda Fendt, MD, PhD 10/08/2023, 9:54 AM

## 2023-10-08 NOTE — TOC Progression Note (Addendum)
 Transition of Care [facility]) - Progression Note    Patient Details  Name: Austin Weaver MRN: [id number] Date of Birth: 04-30-1946  [facility] CM/SW Contact  Bridget Cordella Simmonds, LCSW Phone Number: 10/08/2023, 8:48 AM  Clinical Narrative:   CSW spoke with DSS/Heather Vinie, discussed DC today.  She is requesting copy of DC summary and DNR form.  SNF auth request submitted in [place], spoke with Kristal/[facility]--they can receive pt with auth pending since he is LTC.  CSW confirmed [facility] Vinie received paperwork.  Discussed outpt palliative and [facility] would like [facility].  Referral made.  Expected Discharge Plan: Skilled Nursing Facility Barriers to Discharge: Continued Medical Work up  Expected Discharge Plan and Services     Post Acute Care Choice: Skilled Nursing Facility Living arrangements for the past 2 months: Skilled Nursing Facility                                       Social Determinants of Health (SDOH) Interventions SDOH Screenings   Food Insecurity: Patient Unable To Answer (09/24/2023)  Housing: Patient Unable To Answer (09/24/2023)  Transportation Needs: Patient Unable To Answer (09/24/2023)  Utilities: Not At Risk (09/24/2023)  Depression (PHQ2-9): Low Risk  (07/11/2022)  Financial Resource Strain: Medium Risk (12/01/2018)  Social Connections: Unknown (10/06/2023)  Tobacco Use: High Risk (10/05/2023)    Readmission Risk Interventions     No data to display

## 2023-10-08 NOTE — Discharge Planning (Signed)
 Discharge packet was given to [facility], patient did not have IV access, patient being transferred to [facility] via [facility]. Legal Guardian made aware and requested to send AVS packet via fax, social worker Cathlyn made aware. 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

## 2023-10-12 LAB — SURGICAL PATHOLOGY

## 2023-10-13 NOTE — Progress Notes (Signed)
 noted

## 2023-12-21 ENCOUNTER — Telehealth: Payer: Self-pay | Admitting: Family

## 2023-12-21 NOTE — Telephone Encounter (Signed)
 Contacted Austin Weaver to schedule their annual wellness visit. Patient declined to schedule AWV at this time. Transferred care to [facility].  [facility] Care Guide [facility] AWV TEAM Direct Dial: [phone]

## 2024-01-26 ENCOUNTER — Encounter (HOSPITAL_COMMUNITY): Payer: Self-pay | Admitting: Internal Medicine

## 2024-01-26 ENCOUNTER — Inpatient Hospital Stay (HOSPITAL_COMMUNITY)

## 2024-01-26 ENCOUNTER — Other Ambulatory Visit: Payer: Self-pay

## 2024-01-26 ENCOUNTER — Emergency Department (HOSPITAL_COMMUNITY)

## 2024-01-26 ENCOUNTER — Inpatient Hospital Stay (HOSPITAL_COMMUNITY)
Admission: EM | Admit: 2024-01-26 | Discharge: 2024-01-30 | DRG: 698 | Disposition: A | Discharge status: Skilled Nursing Facility | Source: Skilled Nursing Facility | Attending: Internal Medicine | Admitting: Internal Medicine

## 2024-01-26 DIAGNOSIS — G309 Alzheimer's disease, unspecified: Secondary | ICD-10-CM | POA: Diagnosis present

## 2024-01-26 DIAGNOSIS — C61 Malignant neoplasm of prostate: Secondary | ICD-10-CM | POA: Diagnosis present

## 2024-01-26 DIAGNOSIS — C7951 Secondary malignant neoplasm of bone: Secondary | ICD-10-CM | POA: Diagnosis present

## 2024-01-26 DIAGNOSIS — Z1152 Encounter for screening for COVID-19: Secondary | ICD-10-CM | POA: Diagnosis not present

## 2024-01-26 DIAGNOSIS — T83511A Infection and inflammatory reaction due to indwelling urethral catheter, initial encounter: Principal | ICD-10-CM

## 2024-01-26 DIAGNOSIS — Z823 Family history of stroke: Secondary | ICD-10-CM | POA: Diagnosis not present

## 2024-01-26 DIAGNOSIS — N179 Acute kidney failure, unspecified: Secondary | ICD-10-CM | POA: Diagnosis present

## 2024-01-26 DIAGNOSIS — F02818 Dementia in other diseases classified elsewhere, unspecified severity, with other behavioral disturbance: Secondary | ICD-10-CM | POA: Diagnosis present

## 2024-01-26 DIAGNOSIS — R7881 Bacteremia: Secondary | ICD-10-CM | POA: Diagnosis not present

## 2024-01-26 DIAGNOSIS — N390 Urinary tract infection, site not specified: Secondary | ICD-10-CM | POA: Diagnosis not present

## 2024-01-26 DIAGNOSIS — Z978 Presence of other specified devices: Secondary | ICD-10-CM

## 2024-01-26 DIAGNOSIS — K920 Hematemesis: Secondary | ICD-10-CM | POA: Diagnosis not present

## 2024-01-26 DIAGNOSIS — A4181 Sepsis due to Enterococcus: Secondary | ICD-10-CM | POA: Diagnosis present

## 2024-01-26 DIAGNOSIS — E876 Hypokalemia: Secondary | ICD-10-CM | POA: Diagnosis present

## 2024-01-26 DIAGNOSIS — R6520 Severe sepsis without septic shock: Secondary | ICD-10-CM

## 2024-01-26 DIAGNOSIS — R369 Urethral discharge, unspecified: Secondary | ICD-10-CM | POA: Diagnosis present

## 2024-01-26 DIAGNOSIS — E8809 Other disorders of plasma-protein metabolism, not elsewhere classified: Secondary | ICD-10-CM | POA: Diagnosis present

## 2024-01-26 DIAGNOSIS — F17210 Nicotine dependence, cigarettes, uncomplicated: Secondary | ICD-10-CM | POA: Diagnosis present

## 2024-01-26 DIAGNOSIS — I69954 Hemiplegia and hemiparesis following unspecified cerebrovascular disease affecting left non-dominant side: Secondary | ICD-10-CM

## 2024-01-26 DIAGNOSIS — B958 Unspecified staphylococcus as the cause of diseases classified elsewhere: Secondary | ICD-10-CM | POA: Diagnosis present

## 2024-01-26 DIAGNOSIS — B952 Enterococcus as the cause of diseases classified elsewhere: Secondary | ICD-10-CM

## 2024-01-26 DIAGNOSIS — I10 Essential (primary) hypertension: Secondary | ICD-10-CM | POA: Diagnosis present

## 2024-01-26 DIAGNOSIS — Z79899 Other long term (current) drug therapy: Secondary | ICD-10-CM

## 2024-01-26 DIAGNOSIS — Z66 Do not resuscitate: Secondary | ICD-10-CM | POA: Diagnosis present

## 2024-01-26 DIAGNOSIS — R066 Hiccough: Secondary | ICD-10-CM | POA: Diagnosis present

## 2024-01-26 DIAGNOSIS — G839 Paralytic syndrome, unspecified: Secondary | ICD-10-CM | POA: Diagnosis present

## 2024-01-26 DIAGNOSIS — Y846 Urinary catheterization as the cause of abnormal reaction of the patient, or of later complication, without mention of misadventure at the time of the procedure: Secondary | ICD-10-CM | POA: Diagnosis present

## 2024-01-26 DIAGNOSIS — L89153 Pressure ulcer of sacral region, stage 3: Secondary | ICD-10-CM | POA: Diagnosis present

## 2024-01-26 DIAGNOSIS — A419 Sepsis, unspecified organism: Secondary | ICD-10-CM | POA: Diagnosis not present

## 2024-01-26 DIAGNOSIS — D649 Anemia, unspecified: Secondary | ICD-10-CM | POA: Diagnosis present

## 2024-01-26 DIAGNOSIS — Z7982 Long term (current) use of aspirin: Secondary | ICD-10-CM

## 2024-01-26 DIAGNOSIS — B957 Other staphylococcus as the cause of diseases classified elsewhere: Secondary | ICD-10-CM | POA: Diagnosis not present

## 2024-01-26 DIAGNOSIS — Z8249 Family history of ischemic heart disease and other diseases of the circulatory system: Secondary | ICD-10-CM

## 2024-01-26 DIAGNOSIS — Z22322 Carrier or suspected carrier of Methicillin resistant Staphylococcus aureus: Secondary | ICD-10-CM | POA: Diagnosis not present

## 2024-01-26 DIAGNOSIS — L8990 Pressure ulcer of unspecified site, unspecified stage: Secondary | ICD-10-CM | POA: Insufficient documentation

## 2024-01-26 DIAGNOSIS — Z515 Encounter for palliative care: Secondary | ICD-10-CM | POA: Diagnosis not present

## 2024-01-26 LAB — CBC WITH DIFFERENTIAL/PLATELET
Abs Immature Granulocytes: 0.14 10*3/uL — ABNORMAL HIGH (ref 0.00–0.07)
Basophils Absolute: 0.1 10*3/uL (ref 0.0–0.1)
Basophils Relative: 0 %
Eosinophils Absolute: 0 10*3/uL (ref 0.0–0.5)
Eosinophils Relative: 0 %
HCT: 34.4 % — ABNORMAL LOW (ref 39.0–52.0)
Hemoglobin: 11 g/dL — ABNORMAL LOW (ref 13.0–17.0)
Immature Granulocytes: 1 %
Lymphocytes Relative: 7 %
Lymphs Abs: 1.2 10*3/uL (ref 0.7–4.0)
MCH: 27.9 pg (ref 26.0–34.0)
MCHC: 32 g/dL (ref 30.0–36.0)
MCV: 87.3 fL (ref 80.0–100.0)
Monocytes Absolute: 1.2 10*3/uL — ABNORMAL HIGH (ref 0.1–1.0)
Monocytes Relative: 7 %
Neutro Abs: 15.3 10*3/uL — ABNORMAL HIGH (ref 1.7–7.7)
Neutrophils Relative %: 85 %
Platelets: 340 10*3/uL (ref 150–400)
RBC: 3.94 MIL/uL — ABNORMAL LOW (ref 4.22–5.81)
RDW: 16.6 % — ABNORMAL HIGH (ref 11.5–15.5)
WBC: 17.9 10*3/uL — ABNORMAL HIGH (ref 4.0–10.5)
nRBC: 0 % (ref 0.0–0.2)

## 2024-01-26 LAB — RESP PANEL BY RT-PCR (RSV, FLU A&B, COVID)  RVPGX2
Influenza A by PCR: NEGATIVE
Influenza B by PCR: NEGATIVE
Resp Syncytial Virus by PCR: NEGATIVE
SARS Coronavirus 2 by RT PCR: NEGATIVE

## 2024-01-26 LAB — CBG MONITORING, ED: Glucose-Capillary: 110 mg/dL — ABNORMAL HIGH (ref 70–99)

## 2024-01-26 LAB — URINALYSIS, W/ REFLEX TO CULTURE (INFECTION SUSPECTED)
Bilirubin Urine: NEGATIVE
Glucose, UA: NEGATIVE mg/dL
Ketones, ur: NEGATIVE mg/dL
Nitrite: NEGATIVE
Protein, ur: 100 mg/dL — AB
Specific Gravity, Urine: 1.015 (ref 1.005–1.030)
WBC, UA: >50 WBC/hpf (ref 0–5)
pH: 6 (ref 5.0–8.0)

## 2024-01-26 LAB — COMPREHENSIVE METABOLIC PANEL WITH GFR
ALT: 10 U/L (ref 0–44)
AST: 15 U/L (ref 15–41)
Albumin: 2.5 g/dL — ABNORMAL LOW (ref 3.5–5.0)
Alkaline Phosphatase: 58 U/L (ref 38–126)
Anion gap: 11 (ref 5–15)
BUN: 27 mg/dL — ABNORMAL HIGH (ref 8–23)
CO2: 22 mmol/L (ref 22–32)
Calcium: 8.7 mg/dL — ABNORMAL LOW (ref 8.9–10.3)
Chloride: 104 mmol/L (ref 98–111)
Creatinine, Ser: 1.42 mg/dL — ABNORMAL HIGH (ref 0.61–1.24)
GFR, Estimated: 51 mL/min — ABNORMAL LOW (ref >60)
Glucose, Bld: 106 mg/dL — ABNORMAL HIGH (ref 70–99)
Potassium: 3.7 mmol/L (ref 3.5–5.1)
Sodium: 137 mmol/L (ref 135–145)
Total Bilirubin: 0.8 mg/dL (ref 0.0–1.2)
Total Protein: 7.6 g/dL (ref 6.5–8.1)

## 2024-01-26 LAB — IRON AND TIBC
Iron: 19 ug/dL — ABNORMAL LOW (ref 45–182)
Saturation Ratios: 12 % — ABNORMAL LOW (ref 17.9–39.5)
TIBC: 165 ug/dL — ABNORMAL LOW (ref 250–450)
UIBC: 146 ug/dL

## 2024-01-26 LAB — I-STAT VENOUS BLOOD GAS, ED
Acid-Base Excess: 0 mmol/L (ref 0.0–2.0)
Bicarbonate: 18.3 mmol/L — ABNORMAL LOW (ref 20.0–28.0)
Calcium, Ion: 0.81 mmol/L — CL (ref 1.15–1.40)
HCT: 36 % — ABNORMAL LOW (ref 39.0–52.0)
Hemoglobin: 12.2 g/dL — ABNORMAL LOW (ref 13.0–17.0)
O2 Saturation: 100 %
Potassium: 4.2 mmol/L (ref 3.5–5.1)
Sodium: 138 mmol/L (ref 135–145)
TCO2: 19 mmol/L — ABNORMAL LOW (ref 22–32)
pCO2, Ven: 16.3 mmHg — CL (ref 44–60)
pH, Ven: 7.657 (ref 7.25–7.43)
pO2, Ven: 193 mmHg — ABNORMAL HIGH (ref 32–45)

## 2024-01-26 LAB — I-STAT CHEM 8, ED
BUN: 31 mg/dL — ABNORMAL HIGH (ref 8–23)
Calcium, Ion: 0.82 mmol/L — CL (ref 1.15–1.40)
Chloride: 110 mmol/L (ref 98–111)
Creatinine, Ser: 1.5 mg/dL — ABNORMAL HIGH (ref 0.61–1.24)
Glucose, Bld: 97 mg/dL (ref 70–99)
HCT: 36 % — ABNORMAL LOW (ref 39.0–52.0)
Hemoglobin: 12.2 g/dL — ABNORMAL LOW (ref 13.0–17.0)
Potassium: 4.1 mmol/L (ref 3.5–5.1)
Sodium: 138 mmol/L (ref 135–145)
TCO2: 22 mmol/L (ref 22–32)

## 2024-01-26 LAB — FOLATE: Folate: 10.2 ng/mL (ref >5.9)

## 2024-01-26 LAB — LACTIC ACID, PLASMA
Lactic Acid, Venous: 1.1 mmol/L (ref 0.5–1.9)
Lactic Acid, Venous: 1.3 mmol/L (ref 0.5–1.9)

## 2024-01-26 LAB — TYPE AND SCREEN
ABO/RH(D): O POS
Antibody Screen: NEGATIVE

## 2024-01-26 LAB — I-STAT CG4 LACTIC ACID, ED: Lactic Acid, Venous: 3.6 mmol/L (ref 0.5–1.9)

## 2024-01-26 LAB — VITAMIN B12: Vitamin B-12: 264 pg/mL (ref 180–914)

## 2024-01-26 LAB — RETICULOCYTES
Immature Retic Fract: 25.6 % — ABNORMAL HIGH (ref 2.3–15.9)
RBC.: 3.72 MIL/uL — ABNORMAL LOW (ref 4.22–5.81)
Retic Count, Absolute: 53.9 10*3/uL (ref 19.0–186.0)
Retic Ct Pct: 1.5 % (ref 0.4–3.1)

## 2024-01-26 LAB — TROPONIN I (HIGH SENSITIVITY)
Troponin I (High Sensitivity): 8 ng/L (ref <18)
Troponin I (High Sensitivity): 6 ng/L (ref <18)

## 2024-01-26 LAB — PROTIME-INR
INR: 1.3 — ABNORMAL HIGH (ref 0.8–1.2)
Prothrombin Time: 16.4 s — ABNORMAL HIGH (ref 11.4–15.2)

## 2024-01-26 LAB — APTT: aPTT: 33 s (ref 24–36)

## 2024-01-26 LAB — FERRITIN: Ferritin: 1053 ng/mL — ABNORMAL HIGH (ref 24–336)

## 2024-01-26 LAB — PROCALCITONIN: Procalcitonin: 0.71 ng/mL

## 2024-01-26 MED ORDER — OXYCODONE HCL 5 MG PO TABS: 5.0000 mg | ORAL_TABLET | ORAL | Status: DC | PRN

## 2024-01-26 MED ORDER — PANTOPRAZOLE SODIUM 40 MG IV SOLR
40.0000 mg | Freq: Two times a day (BID) | INTRAVENOUS | Status: DC
  Administered 2024-01-27 – 2024-01-30 (×6): 40 mg via INTRAVENOUS
  Filled 2024-01-26 (×7): qty 10

## 2024-01-26 MED ORDER — SODIUM CHLORIDE 0.9 % IV SOLN
500.0000 mg | INTRAVENOUS | Status: DC
  Administered 2024-01-26: 500 mg via INTRAVENOUS
  Filled 2024-01-26: qty 5

## 2024-01-26 MED ORDER — CEFTRIAXONE SODIUM 1 G IJ SOLR
1.0000 g | Freq: Once | INTRAMUSCULAR | Status: AC
  Administered 2024-01-26: 1 g via INTRAVENOUS
  Filled 2024-01-26: qty 10

## 2024-01-26 MED ORDER — SODIUM CHLORIDE 0.9 % IV SOLN: 1.0000 g | INTRAVENOUS | Status: DC

## 2024-01-26 MED ORDER — FLUTICASONE PROPIONATE 50 MCG/ACT NA SUSP
2.0000 | Freq: Every day | NASAL | Status: DC
  Administered 2024-01-27 – 2024-01-30 (×4): 2 via NASAL
  Filled 2024-01-26: qty 16

## 2024-01-26 MED ORDER — SENNOSIDES-DOCUSATE SODIUM 8.6-50 MG PO TABS: 1.0000 | ORAL_TABLET | Freq: Every evening | ORAL | Status: DC | PRN

## 2024-01-26 MED ORDER — LEVALBUTEROL HCL 0.63 MG/3ML IN NEBU: 0.6300 mg | INHALATION_SOLUTION | Freq: Four times a day (QID) | RESPIRATORY_TRACT | Status: DC | PRN

## 2024-01-26 MED ORDER — SODIUM CHLORIDE 0.9 % IV BOLUS
500.0000 mL | Freq: Once | INTRAVENOUS | Status: AC
  Administered 2024-01-26: 500 mL via INTRAVENOUS

## 2024-01-26 MED ORDER — PANTOPRAZOLE SODIUM 40 MG IV SOLR: 40.0000 mg | INTRAVENOUS | Status: AC

## 2024-01-26 MED ORDER — ONDANSETRON HCL 4 MG/2ML IJ SOLN: 4.0000 mg | Freq: Four times a day (QID) | INTRAMUSCULAR | Status: DC | PRN

## 2024-01-26 MED ORDER — IBUPROFEN 200 MG PO TABS
400.0000 mg | ORAL_TABLET | Freq: Four times a day (QID) | ORAL | Status: DC | PRN
  Administered 2024-01-30: 400 mg via ORAL
  Filled 2024-01-26: qty 2

## 2024-01-26 MED ORDER — ATORVASTATIN CALCIUM 40 MG PO TABS
40.0000 mg | ORAL_TABLET | Freq: Every day | ORAL | Status: DC
  Administered 2024-01-26 – 2024-01-29 (×4): 40 mg via ORAL
  Filled 2024-01-26 (×4): qty 1

## 2024-01-26 MED ORDER — PANTOPRAZOLE SODIUM 40 MG IV SOLR: 40.0000 mg | Freq: Two times a day (BID) | INTRAVENOUS | Status: DC

## 2024-01-26 MED ORDER — SODIUM CHLORIDE 0.9 % IV SOLN: INTRAVENOUS | Status: DC

## 2024-01-26 MED ORDER — HEPARIN SODIUM (PORCINE) 5000 UNIT/ML IJ SOLN
5000.0000 [IU] | Freq: Three times a day (TID) | INTRAMUSCULAR | Status: DC
  Administered 2024-01-27 – 2024-01-30 (×9): 5000 [IU] via SUBCUTANEOUS
  Filled 2024-01-26 (×10): qty 1

## 2024-01-26 MED ORDER — BISACODYL 10 MG RE SUPP: 10.0000 mg | Freq: Every day | RECTAL | Status: DC | PRN

## 2024-01-26 MED ORDER — SODIUM CHLORIDE 0.9 % IV SOLN: Freq: Once | INTRAVENOUS | Status: DC

## 2024-01-26 MED ORDER — ASPIRIN 81 MG PO TBEC: 81.0000 mg | DELAYED_RELEASE_TABLET | Freq: Every day | ORAL | Status: DC

## 2024-01-26 MED ORDER — FENTANYL CITRATE PF 50 MCG/ML IJ SOSY: 12.5000 ug | PREFILLED_SYRINGE | INTRAMUSCULAR | Status: DC | PRN

## 2024-01-26 MED ORDER — ONDANSETRON HCL 4 MG PO TABS
4.0000 mg | ORAL_TABLET | Freq: Four times a day (QID) | ORAL | Status: DC | PRN
Start: 2024-01-26 — End: 2024-01-30

## 2024-01-26 MED ORDER — SODIUM CHLORIDE 0.9 % IV BOLUS
1000.0000 mL | Freq: Once | INTRAVENOUS | Status: AC
  Administered 2024-01-26: 1000 mL via INTRAVENOUS

## 2024-01-26 MED ORDER — HYDRALAZINE HCL 20 MG/ML IJ SOLN: 10.0000 mg | Freq: Four times a day (QID) | INTRAMUSCULAR | Status: DC | PRN

## 2024-01-26 NOTE — ED Provider Notes (Signed)
 [place] EMERGENCY DEPARTMENT AT [facility] Provider Note   CSN: [id number] Arrival date & time: 01/26/24  1202     History  No chief complaint on file.   Austin Weaver is a 78 y.o. male.  78 year old male with prior medical history as detailed below presents for evaluation.  Patient resides at [facility].  EMS transported him in today for evaluation of purulent discharge from his catheter.  Patient was noted to have purulent drainage from penis.  Catheter was changed this morning.  Drainage from the new catheter was quite purulent.  Patient was sent in for evaluation.  Patient with 1 reported episodes of vomiting prior to arrival.  On arrival to the ED, patient is without specific complaint.  The history is provided by the patient and the EMS personnel.       Home Medications Prior to Admission medications   Medication Sig Start Date End Date Taking? Authorizing Provider  acetaminophen  (TYLENOL ) 325 MG tablet Take 650 mg by mouth every 6 (six) hours as needed for mild pain (pain score 1-3) or moderate pain (pain score 4-6).    [provider]  amLODipine  (NORVASC ) 10 MG tablet Take 1 tablet (10 mg total) by mouth daily. 02/20/23   Felicita Horns, FNP  aspirin  EC 81 MG tablet Take 81 mg by mouth daily. Swallow whole.    [provider]  atorvastatin  (LIPITOR ) 40 MG tablet Take 1 tablet (40 mg total) by mouth daily. Patient taking differently: Take 40 mg by mouth at bedtime. 02/20/23 02/21/24  Felicita Horns, FNP  bisacodyl  (DULCOLAX) 5 MG EC tablet Take 1 tablet (5 mg total) by mouth daily as needed for moderate constipation. 10/08/23   Gherghe, Costin M, MD  fluticasone  (FLONASE ) 50 MCG/ACT nasal spray Place 2 sprays into both nostrils daily for 6 days. 05/21/23 05/27/23  Wynetta Heckle, MD  hydrochlorothiazide  (HYDRODIURIL ) 12.5 MG tablet Take 12.5 mg by mouth daily. 03/30/23   [provider]  Vitamin D, Ergocalciferol, (DRISDOL) 1.25 MG  (50000 UNIT) CAPS capsule Take 50,000 Units by mouth every 7 (seven) days. Every Tuesday    [provider]      Allergies    Patient has no known allergies.    Review of Systems   Review of Systems  Unable to perform ROS: Acuity of condition    Physical Exam Updated Vital Signs There were no vitals taken for this visit. Physical Exam Vitals and nursing note reviewed.  Constitutional:      General: He is not in acute distress.    Appearance: He is well-developed.     Comments: Alert, chronically ill, contracted extremities  HENT:     Head: Normocephalic and atraumatic.  Eyes:     Conjunctiva/sclera: Conjunctivae normal.     Pupils: Pupils are equal, round, and reactive to light.  Cardiovascular:     Rate and Rhythm: Normal rate and regular rhythm.     Heart sounds: Normal heart sounds.  Pulmonary:     Effort: Pulmonary effort is normal. No respiratory distress.     Breath sounds: Normal breath sounds.  Abdominal:     General: There is no distension.     Palpations: Abdomen is soft.     Tenderness: There is no abdominal tenderness.  Genitourinary:    Comments: Foley catheter in place, grossly purulent drainage in catheter and a catheter collection bag Musculoskeletal:        General: No deformity. Normal range of motion.  Cervical back: Normal range of motion and neck supple.  Skin:    General: Skin is warm and dry.  Neurological:     General: No focal deficit present.     Mental Status: He is alert. Mental status is at baseline.     ED Results / Procedures / Treatments   Labs (all labs ordered are listed, but only abnormal results are displayed) Labs Reviewed  CULTURE, BLOOD (ROUTINE X 2)  CULTURE, BLOOD (ROUTINE X 2)  RESP PANEL BY RT-PCR (RSV, FLU A&B, COVID)  [id number]  CBC WITH DIFFERENTIAL/PLATELET  COMPREHENSIVE METABOLIC PANEL WITH GFR  URINALYSIS, W/ REFLEX TO CULTURE (INFECTION SUSPECTED)  I-STAT CHEM 8, ED  I-STAT CG4 LACTIC ACID, ED   I-STAT VENOUS BLOOD GAS, ED  CBG MONITORING, ED  TYPE AND SCREEN  TROPONIN I (HIGH SENSITIVITY)    EKG None  Radiology No results found.  Procedures Procedures    Medications Ordered in ED Medications  cefTRIAXone  (ROCEPHIN ) 1 g in sodium chloride  0.9 % 100 mL IVPB (has no administration in time range)    ED Course/ Medical Decision Making/ A&P                                 Medical Decision Making Amount and/or Complexity of Data Reviewed Labs: ordered. Radiology: ordered.  Risk Decision regarding hospitalization.    Medical Screen Complete  This patient presented to the ED with complaint of UTI.  This complaint involves an extensive number of treatment options. The initial differential diagnosis includes, but is not limited to, infection, metabolic abnormality, etc  This presentation is: Acute, Chronic, Self-Limited, Previously Undiagnosed, Uncertain Prognosis, Complicated, Systemic Symptoms, and Threat to Life/Bodily Function  Patient with UTI - grossly purulent urine present on exam.   Workup suggests concurrent AKI.   Patient will benefit from admission.   Hospitalist service aware of case and need to admit.   Co morbidities that complicated the patient's evaluation  See HPI   Additional history obtained:  External records from outside sources obtained and reviewed including prior ED visits and prior Inpatient records.    Problem List / ED Course:  UTI, AKI   Disposition:  After consideration of the diagnostic results and the patients response to treatment, I feel that the patent would benefit from admission.          Final Clinical Impression(s) / ED Diagnoses Final diagnoses:  Urinary tract infection without hematuria, site unspecified  AKI (acute kidney injury) [facility])    Rx / DC Orders ED Discharge Orders     None         Burnette Carte, MD 01/26/24 [phone]

## 2024-01-26 NOTE — ED Triage Notes (Signed)
 Patient from [facility] via EMS. Pt had one episode of coffee ground emesis this morning. Staff also reports puss/mucous discharge from penis. They changed the catheter this morning.

## 2024-01-26 NOTE — Sepsis Progress Note (Signed)
 Elink monitoring for the code sepsis protocol.

## 2024-01-26 NOTE — Progress Notes (Signed)
 [facility] ED 19 [facility]       This patient is a current hospice patient with [facility], admitted 4.4.25 with a terminal diagnosis of metastatic carcinoma of unknown primary site.    [facility] will continue to follow for any discharge planning needs and to coordinate continuation of hospice care.    Please don't hesitate to call with any Hospice related questions or concerns.    Ardine Beckwith, LPN [facility] Liaison [phone]

## 2024-01-26 NOTE — Sepsis Progress Note (Signed)
 Notified bedside nurse of need to draw repeat lactic acid.

## 2024-01-26 NOTE — H&P (Signed)
 History and Physical    Patient: Austin Weaver WUJ:[id number] DOB: 06/30/1946 DOA: 01/26/2024 DOS: the patient was seen and examined on 01/26/2024 PCP: [place] [facility]  Patient coming from: SNF  Chief Complaint: No chief complaint on file.  HPI: Austin Weaver is a 78 y.o. male with medical history significant for but not limited to history of TIA and CVA, recurrent falls, metastatic carcinoma of unknown primary site but has a T8 vertebral (unknown if primary site or metastasis) body mass with epidural spread causing severe spinal canal stenosis and compression of the spinal cord with resultant flaccid paraplegia and urinary retention, hypertension, hyperlipidemia who is currently on hospice who presents from his nursing facility for a single episode of coffee-ground emesis in the morning as well as mucopurulent discharge from his penis and cloudy urine.  Patient's facility tried to change his catheter but had difficulty if I was able to do so.  Given his coffee-ground emesis and purulent urine they sent him for further evaluation to the ED.  Patient is a poor historian himself and has a history of dementia and feels okay at this time.  The case was discussed with his legal guardian who wants further evaluation and workup and is okay with antibiotics, fluids as well as further investigation including EGD.  Patient was found to have purulent urine and was placed on the PPI drip and [facility] was asked to admit this patient for urinary tract infection and upon evaluation patient was found to be in severe sepsis given his lactic acid level being elevated so sepsis protocol initiated on the patient.  Review of Systems: unable to review all systems due to the inability of the patient to answer questions. Past Medical History:  Diagnosis Date   Allergy    Fall 09/21/2018   History of transient ischemic attack (TIA) 11/20/2017   History of transient ischemic attack (TIA) 11/20/2017   Hypertension    Old  Cerebral infarction (HCC) 09/21/2018   Remote L. thalamic and R. external capsul & corona radiata infarcts   Stroke [facility])    Past Surgical History:  Procedure Laterality Date   IR FLUORO GUIDED NEEDLE PLC ASPIRATION/INJECTION LOC  10/06/2023   laporotomy     Social History:  reports that he has been smoking cigarettes. He has a 25 pack-year smoking history. He has never used smokeless tobacco. He reports that he does not drink alcohol and does not use drugs.  ALLERGIES No Known Allergies  Family History  Problem Relation Age of Onset   Hypertension Mother    Diabetes Mother    Stroke Mother    Stroke Father    Stroke Maternal Grandmother    Stroke Maternal Grandfather    Cancer Maternal Grandfather        unknown type   Stroke Paternal Grandmother    Stroke Paternal Grandfather    Kidney failure Son    Prior to Admission medications   Medication Sig Start Date End Date Taking? Authorizing Provider  acetaminophen  (TYLENOL ) 325 MG tablet Take 650 mg by mouth every 6 (six) hours as needed for mild pain (pain score 1-3) or moderate pain (pain score 4-6).    [provider]  amLODipine  (NORVASC ) 10 MG tablet Take 1 tablet (10 mg total) by mouth daily. 02/20/23   Felicita Horns, FNP  aspirin  EC 81 MG tablet Take 81 mg by mouth daily. Swallow whole.    [provider]  atorvastatin  (LIPITOR ) 40 MG tablet Take 1 tablet (40 mg total) by  mouth daily. Patient taking differently: Take 40 mg by mouth at bedtime. 02/20/23 02/21/24  Felicita Horns, FNP  bisacodyl  (DULCOLAX) 5 MG EC tablet Take 1 tablet (5 mg total) by mouth daily as needed for moderate constipation. 10/08/23   Gherghe, Costin M, MD  fluticasone  (FLONASE ) 50 MCG/ACT nasal spray Place 2 sprays into both nostrils daily for 6 days. 05/21/23 05/27/23  Wynetta Heckle, MD  hydrochlorothiazide  (HYDRODIURIL ) 12.5 MG tablet Take 12.5 mg by mouth daily. 03/30/23   [provider]  Vitamin D, Ergocalciferol, (DRISDOL)  1.25 MG (50000 UNIT) CAPS capsule Take 50,000 Units by mouth every 7 (seven) days. Every Tuesday    [provider]    Physical Exam: Vitals:   01/26/24 1306  BP: 126/67  Pulse: (!) 109  Resp: (!) 27  Temp: 98.5 F (36.9 C)  TempSrc: Oral  SpO2: 100%   Examination: Physical Exam:  Constitutional: Chronically ill-appearing African-American male with paraplegia and contractures Respiratory: Diminished to auscultation bilaterally with some coarse breath sounds, no wheezing, rales, rhonchi or crackles. Normal respiratory effort and patient is not tachypenic. No accessory muscle use.  Unlabored breathing Cardiovascular: Tachycardic rate but regular rhythm, no murmurs / rubs / gallops. S1 and S2 auscultated.  Lower extremity edema noted Abdomen: Soft, non-tender, slightly distended secondary body habitus. Bowel sounds positive.  GU: Deferred.  Foley catheter is draining purulent urine Musculoskeletal: Contractures noted and patient is bent over to his right side Neurologic: CN 2-12 grossly intact with no focal deficits.  Romberg sign not assessed Psychiatric: Is awake and alert but pleasantly confused and demented  Data Reviewed:  Recent Results (from the past 2160 hours)  Resp panel by RT-PCR (RSV, Flu A&B, Covid) Anterior Nasal Swab     Status: None   Collection Time: 01/26/24 12:18 PM   Specimen: Anterior Nasal Swab  Result Value Ref Range   SARS Coronavirus 2 by RT PCR NEGATIVE NEGATIVE   Influenza A by PCR NEGATIVE NEGATIVE   Influenza B by PCR NEGATIVE NEGATIVE    Comment: (NOTE) The Xpert Xpress SARS-CoV-2/FLU/RSV plus assay is intended as an aid in the diagnosis of influenza from Nasopharyngeal swab specimens and should not be used as a sole basis for treatment. Nasal washings and aspirates are unacceptable for Xpert Xpress SARS-CoV-2/FLU/RSV testing.  Fact Sheet for Patients: BloggerCourse.com  Fact Sheet for Healthcare  Providers: SeriousBroker.it  This test is not yet approved or cleared by the [place]  FDA and has been authorized for detection and/or diagnosis of SARS-CoV-2 by FDA under an Emergency Use Authorization (EUA). This EUA will remain in effect (meaning this test can be used) for the duration of the COVID-19 declaration under Section 564(b)(1) of the Act, 21 U.S.C. section 360bbb-3(b)(1), unless the authorization is terminated or revoked.     Resp Syncytial Virus by PCR NEGATIVE NEGATIVE    Comment: (NOTE) Fact Sheet for Patients: BloggerCourse.com  Fact Sheet for Healthcare Providers: SeriousBroker.it  This test is not yet approved or cleared by the [place]  FDA and has been authorized for detection and/or diagnosis of SARS-CoV-2 by FDA under an Emergency Use Authorization (EUA). This EUA will remain in effect (meaning this test can be used) for the duration of the COVID-19 declaration under Section 564(b)(1) of the Act, 21 U.S.C. section 360bbb-3(b)(1), unless the authorization is terminated or revoked.  Performed at [facility], [address], [place], [place] [address]   CBG monitoring, ED     Status: Abnormal   Collection  Time: 01/26/24  1:12 PM  Result Value Ref Range   Glucose-Capillary 110 (H) 70 - 99 mg/dL    Comment: Glucose reference range applies only to samples taken after fasting for at least 8 hours.  I-stat chem 8, ED     Status: Abnormal   Collection Time: 01/26/24  1:16 PM  Result Value Ref Range   Sodium 138 135 - 145 mmol/L   Potassium 4.1 3.5 - 5.1 mmol/L   Chloride 110 98 - 111 mmol/L   BUN 31 (H) 8 - 23 mg/dL   Creatinine, Ser 1.61 (H) 0.61 - 1.24 mg/dL   Glucose, Bld 97 70 - 99 mg/dL    Comment: Glucose reference range applies only to samples taken after fasting for at least 8 hours.   Calcium , Ion 0.82 (LL) 1.15 - 1.40 mmol/L   TCO2 22 22 - 32 mmol/L    Hemoglobin 12.2 (L) 13.0 - 17.0 g/dL   HCT 09.6 (L) 04.5 - 40.9 %   Comment NOTIFIED PHYSICIAN   I-Stat Lactic Acid     Status: Abnormal   Collection Time: 01/26/24  1:16 PM  Result Value Ref Range   Lactic Acid, Venous 3.6 (HH) 0.5 - 1.9 mmol/L   Comment NOTIFIED PHYSICIAN   I-Stat venous blood gas, ED     Status: Abnormal   Collection Time: 01/26/24  1:16 PM  Result Value Ref Range   pH, Ven 7.657 (HH) 7.25 - 7.43   pCO2, Ven 16.3 (LL) 44 - 60 mmHg   pO2, Ven 193 (H) 32 - 45 mmHg   Bicarbonate 18.3 (L) 20.0 - 28.0 mmol/L   TCO2 19 (L) 22 - 32 mmol/L   O2 Saturation 100 %   Acid-Base Excess 0.0 0.0 - 2.0 mmol/L   Sodium 138 135 - 145 mmol/L   Potassium 4.2 3.5 - 5.1 mmol/L   Calcium , Ion 0.81 (LL) 1.15 - 1.40 mmol/L   HCT 36.0 (L) 39.0 - 52.0 %   Hemoglobin 12.2 (L) 13.0 - 17.0 g/dL   Sample type VENOUS    Comment NOTIFIED PHYSICIAN   CBC with Differential     Status: Abnormal   Collection Time: 01/26/24  1:37 PM  Result Value Ref Range   WBC 17.9 (H) 4.0 - 10.5 K/uL   RBC 3.94 (L) 4.22 - 5.81 MIL/uL   Hemoglobin 11.0 (L) 13.0 - 17.0 g/dL   HCT 81.1 (L) 91.4 - 78.2 %   MCV 87.3 80.0 - 100.0 fL   MCH 27.9 26.0 - 34.0 pg   MCHC 32.0 30.0 - 36.0 g/dL   RDW 95.6 (H) 21.3 - 08.6 %   Platelets 340 150 - 400 K/uL   nRBC 0.0 0.0 - 0.2 %   Neutrophils Relative % 85 %   Neutro Abs 15.3 (H) 1.7 - 7.7 K/uL   Lymphocytes Relative 7 %   Lymphs Abs 1.2 0.7 - 4.0 K/uL   Monocytes Relative 7 %   Monocytes Absolute 1.2 (H) 0.1 - 1.0 K/uL   Eosinophils Relative 0 %   Eosinophils Absolute 0.0 0.0 - 0.5 K/uL   Basophils Relative 0 %   Basophils Absolute 0.1 0.0 - 0.1 K/uL   Immature Granulocytes 1 %   Abs Immature Granulocytes 0.14 (H) 0.00 - 0.07 K/uL    Comment: Performed at [facility], [address], [place], [place] [address]  Type and screen [facility]     Status: None   Collection Time: 01/26/24  1:37  PM  Result Value Ref Range   ABO/RH(D) O POS     Antibody Screen NEG    Sample Expiration      01/29/2024,2359 Performed at [facility], [address], [place], [place] [address]   Troponin I (High Sensitivity)     Status: None   Collection Time: 01/26/24  1:37 PM  Result Value Ref Range   Troponin I (High Sensitivity) 8 <18 ng/L    Comment: (NOTE) Elevated high sensitivity troponin I (hsTnI) values and significant  changes across serial measurements may suggest ACS but many other  chronic and acute conditions are known to elevate hsTnI results.  Refer to the "Links" section for chest pain algorithms and additional  guidance. Performed at [facility], [address] [place], [place] [address]   Comprehensive metabolic panel     Status: Abnormal   Collection Time: 01/26/24  1:37 PM  Result Value Ref Range   Sodium 137 135 - 145 mmol/L   Potassium 3.7 3.5 - 5.1 mmol/L   Chloride 104 98 - 111 mmol/L   CO2 22 22 - 32 mmol/L   Glucose, Bld 106 (H) 70 - 99 mg/dL    Comment: Glucose reference range applies only to samples taken after fasting for at least 8 hours.   BUN 27 (H) 8 - 23 mg/dL   Creatinine, Ser 0.98 (H) 0.61 - 1.24 mg/dL   Calcium  8.7 (L) 8.9 - 10.3 mg/dL   Total Protein 7.6 6.5 - 8.1 g/dL   Albumin 2.5 (L) 3.5 - 5.0 g/dL   AST 15 15 - 41 U/L   ALT 10 0 - 44 U/L   Alkaline Phosphatase 58 38 - 126 U/L   Total Bilirubin 0.8 0.0 - 1.2 mg/dL   GFR, Estimated 51 (L) >60 mL/min    Comment: (NOTE) Calculated using the CKD-EPI Creatinine Equation (2021)    Anion gap 11 5 - 15    Comment: Performed at [facility], [address], [place], [place] [address]   EKG: EKG showed sinus tachycardia with a rate of 109 with a QTc of 396.  There is nonspecific T wave abnormalities but no ST elevation or depression on my interpretation  Assessment and Plan: No notes have been filed under this hospital service. Service: Hospitalist  Severe Sepsis 2/2 to Catheter Associated UTI w/ Urinary Retention with ?  Right Lung PNA: Admit to Inpatient Telemetry. LA was 3.6. WBC was 17.9. Tachycardic of 109. Tachypenic with RR of 27. Source of Infection likely Urine (EDP ordered and hasn't sent but states purulent). Given 500 mL bolus by EDP. Will bolus an additional 1 Liter. Obtain STAT Urine and Urine Cx as EDP ordered Abx w IV CTX. C/w mIVF with NS at 100 mL/hr. Trend LA and Check PCT. Check Blood Cx x2. CXR done and showed Mild right basilar atelectasis or infiltrate is noted with associated effusion. C/w Supportive care and repeat CXR in the AM. Given Concern for PNA will C/w IV CTX and add IV Azithromycin    AKI: BUN/Cr Trend: Recent Labs  Lab 01/26/24 1316 01/26/24 1337  BUN 31* 27*  CREATININE 1.50* 1.42*  -Check Renal U/S, UA, Urine Cx and exchange foley -Fluids as above -Avoid Nephrotoxic Medications, Contrast Dyes, Hypotension and Dehydration to Ensure Adequate Renal Perfusion and will need to Renally Adjust Meds -Continue to Monitor and Trend Renal Function carefully and repeat CMP in the AM   Coffee Ground Emesis: Had an episode at  his facility this morning.  Placed on IV PPI.  Discussed the case with his legal guardian Dontay and she has elected for further evaluation to treat the treatable.  GI consulted and he is made n.p.o. at midnight in case they take him for EGD.  Will allow for clear liquid diet before that.  Continue to monitor hemoglobin/hematocrit.  And continue with supportive care and antiemetics.  His aspirin  has not been held.  Metastatic carcinoma of unknown primary site; T8 vertebral body malignancy with epidural spread causing severe spinal canal stenosis and compression of spinal cord with resultant flaccid paraplegia and urinary retention: She is status post high-dose Decadron  trial and unfortunately had no motor improvement.  Radiation oncology evaluated at last hospitalization and felt that this was irreversible.  Patient has since then been on hospice.  Continues to have  contractures and has a indwelling Foley catheter which is the cause of infection as above.  [facility] is notified and following   Essential HTN: Hold Hydrochlorothiazide  12.5 mg po Daily given AKI. Hold Amlodipine  10 mg po daily due to Severe Sepsis. CTM BP per Protocol and if necessary place on IV PRN Antihypertensives. Last BP reading was 126/67  Hyperlipidemia: C/w Atorvastatin  40 mg po qHS   Dementia-alert to self, place, knows why he is here.  He does not know the year.  He tells me that prior to being here he would walk with a walker and sometimes in the wheelchair, and was able to socialize and had friends in his nursing home.  Singultus: was on Thorazine  at last hospitalization but may need to be resumed  Normocytic Anemia: Hgb/hct went from 12.2/36.0 -> 11.0/34.4. Check Anemia Panel. CTM for S/Sx of Bleeding; No overt bleeding noted while hospitalized here but there is report of his coffee-ground emesis this morning. Repeat CBC in the AM and monitor H&H every 6  Hx of TIA/CVA: C/w ASA 81 mg po daily and Atorvastatin  40 mg po Daily  Hypoalbuminemia: Patient's Albumin level is 2.5. CTM and Trend and repeat CMP in the AM  Goals of care: Is DNR and a Hospice Patient. [facility] Notified of Admission. Unfortunately they have no other info about the patient except that he is DNR  Advance Care Planning:   Code Status: Prior DNR  Consults: None  Family Communication: No family at bedside but I did speak to his legal guardian Dontay  Severity of Illness: The appropriate patient status for this patient is INPATIENT. Inpatient status is judged to be reasonable and necessary in order to provide the required intensity of service to ensure the patient's safety. The patient's presenting symptoms, physical exam findings, and initial radiographic and laboratory data in the context of their chronic comorbidities is felt to place them at high risk for further clinical deterioration.  Furthermore, it is not anticipated that the patient will be medically stable for discharge from the hospital within 2 midnights of admission.   * I certify that at the point of admission it is my clinical judgment that the patient will require inpatient hospital care spanning beyond 2 midnights from the point of admission due to high intensity of service, high risk for further deterioration and high frequency of surveillance required.*  Author: Aura Leeds, DO [facility] 01/26/2024 3:15 PM  For on call review [email or web address].

## 2024-01-27 ENCOUNTER — Inpatient Hospital Stay (HOSPITAL_COMMUNITY)

## 2024-01-27 DIAGNOSIS — Z66 Do not resuscitate: Secondary | ICD-10-CM

## 2024-01-27 DIAGNOSIS — Z22322 Carrier or suspected carrier of Methicillin resistant Staphylococcus aureus: Secondary | ICD-10-CM

## 2024-01-27 DIAGNOSIS — B952 Enterococcus as the cause of diseases classified elsewhere: Secondary | ICD-10-CM

## 2024-01-27 DIAGNOSIS — T83511A Infection and inflammatory reaction due to indwelling urethral catheter, initial encounter: Secondary | ICD-10-CM | POA: Diagnosis not present

## 2024-01-27 DIAGNOSIS — R7881 Bacteremia: Secondary | ICD-10-CM

## 2024-01-27 DIAGNOSIS — N390 Urinary tract infection, site not specified: Secondary | ICD-10-CM | POA: Diagnosis not present

## 2024-01-27 DIAGNOSIS — Z515 Encounter for palliative care: Secondary | ICD-10-CM

## 2024-01-27 DIAGNOSIS — C7951 Secondary malignant neoplasm of bone: Secondary | ICD-10-CM

## 2024-01-27 DIAGNOSIS — C61 Malignant neoplasm of prostate: Secondary | ICD-10-CM

## 2024-01-27 DIAGNOSIS — I10 Essential (primary) hypertension: Secondary | ICD-10-CM

## 2024-01-27 DIAGNOSIS — G309 Alzheimer's disease, unspecified: Secondary | ICD-10-CM | POA: Diagnosis not present

## 2024-01-27 DIAGNOSIS — F02818 Dementia in other diseases classified elsewhere, unspecified severity, with other behavioral disturbance: Secondary | ICD-10-CM

## 2024-01-27 DIAGNOSIS — B957 Other staphylococcus as the cause of diseases classified elsewhere: Secondary | ICD-10-CM

## 2024-01-27 LAB — BLOOD CULTURE ID PANEL (REFLEXED) - BCID2
A.calcoaceticus-baumannii: NOT DETECTED
Bacteroides fragilis: NOT DETECTED
Candida albicans: NOT DETECTED
Candida auris: NOT DETECTED
Candida glabrata: NOT DETECTED
Candida krusei: NOT DETECTED
Candida parapsilosis: NOT DETECTED
Candida tropicalis: NOT DETECTED
Cryptococcus neoformans/gattii: NOT DETECTED
Enterobacter cloacae complex: NOT DETECTED
Enterobacterales: NOT DETECTED
Enterococcus Faecium: NOT DETECTED
Enterococcus faecalis: DETECTED — AB
Escherichia coli: NOT DETECTED
Haemophilus influenzae: NOT DETECTED
Klebsiella aerogenes: NOT DETECTED
Klebsiella oxytoca: NOT DETECTED
Klebsiella pneumoniae: NOT DETECTED
Listeria monocytogenes: NOT DETECTED
Methicillin resistance mecA/C: DETECTED — AB
Neisseria meningitidis: NOT DETECTED
Proteus species: NOT DETECTED
Pseudomonas aeruginosa: NOT DETECTED
Salmonella species: NOT DETECTED
Serratia marcescens: NOT DETECTED
Staphylococcus aureus (BCID): NOT DETECTED
Staphylococcus epidermidis: DETECTED — AB
Staphylococcus lugdunensis: DETECTED — AB
Staphylococcus species: DETECTED — AB
Stenotrophomonas maltophilia: NOT DETECTED
Streptococcus agalactiae: NOT DETECTED
Streptococcus pneumoniae: NOT DETECTED
Streptococcus pyogenes: NOT DETECTED
Streptococcus species: NOT DETECTED
Vancomycin resistance: NOT DETECTED

## 2024-01-27 LAB — CBC WITH DIFFERENTIAL/PLATELET
Abs Immature Granulocytes: 0.08 10*3/uL — ABNORMAL HIGH (ref 0.00–0.07)
Basophils Absolute: 0.1 10*3/uL (ref 0.0–0.1)
Basophils Relative: 1 %
Eosinophils Absolute: 0.3 10*3/uL (ref 0.0–0.5)
Eosinophils Relative: 2 %
HCT: 29.2 % — ABNORMAL LOW (ref 39.0–52.0)
Hemoglobin: 9.4 g/dL — ABNORMAL LOW (ref 13.0–17.0)
Immature Granulocytes: 1 %
Lymphocytes Relative: 16 %
Lymphs Abs: 2 10*3/uL (ref 0.7–4.0)
MCH: 28 pg (ref 26.0–34.0)
MCHC: 32.2 g/dL (ref 30.0–36.0)
MCV: 86.9 fL (ref 80.0–100.0)
Monocytes Absolute: 1 10*3/uL (ref 0.1–1.0)
Monocytes Relative: 8 %
Neutro Abs: 9.2 10*3/uL — ABNORMAL HIGH (ref 1.7–7.7)
Neutrophils Relative %: 72 %
Platelets: 310 10*3/uL (ref 150–400)
RBC: 3.36 MIL/uL — ABNORMAL LOW (ref 4.22–5.81)
RDW: 16.8 % — ABNORMAL HIGH (ref 11.5–15.5)
WBC: 12.6 10*3/uL — ABNORMAL HIGH (ref 4.0–10.5)
nRBC: 0 % (ref 0.0–0.2)

## 2024-01-27 LAB — COMPREHENSIVE METABOLIC PANEL WITH GFR
ALT: 8 U/L (ref 0–44)
AST: 13 U/L — ABNORMAL LOW (ref 15–41)
Albumin: 2.2 g/dL — ABNORMAL LOW (ref 3.5–5.0)
Alkaline Phosphatase: 45 U/L (ref 38–126)
Anion gap: 12 (ref 5–15)
BUN: 17 mg/dL (ref 8–23)
CO2: 20 mmol/L — ABNORMAL LOW (ref 22–32)
Calcium: 8.2 mg/dL — ABNORMAL LOW (ref 8.9–10.3)
Chloride: 105 mmol/L (ref 98–111)
Creatinine, Ser: 0.86 mg/dL (ref 0.61–1.24)
GFR, Estimated: >60 mL/min (ref >60)
Glucose, Bld: 90 mg/dL (ref 70–99)
Potassium: 3.1 mmol/L — ABNORMAL LOW (ref 3.5–5.1)
Sodium: 137 mmol/L (ref 135–145)
Total Bilirubin: 0.8 mg/dL (ref 0.0–1.2)
Total Protein: 6.4 g/dL — ABNORMAL LOW (ref 6.5–8.1)

## 2024-01-27 LAB — ABO/RH: ABO/RH(D): O POS

## 2024-01-27 LAB — HEMOGLOBIN AND HEMATOCRIT, BLOOD
HCT: 29.1 % — ABNORMAL LOW (ref 39.0–52.0)
HCT: 29.5 % — ABNORMAL LOW (ref 39.0–52.0)
HCT: 31 % — ABNORMAL LOW (ref 39.0–52.0)
Hemoglobin: 9.4 g/dL — ABNORMAL LOW (ref 13.0–17.0)
Hemoglobin: 9.6 g/dL — ABNORMAL LOW (ref 13.0–17.0)
Hemoglobin: 9.9 g/dL — ABNORMAL LOW (ref 13.0–17.0)

## 2024-01-27 LAB — URINE CULTURE

## 2024-01-27 LAB — TSH: TSH: 0.516 u[IU]/mL (ref 0.350–4.500)

## 2024-01-27 MED ORDER — VANCOMYCIN HCL 1500 MG/300ML IV SOLN
1500.0000 mg | Freq: Once | INTRAVENOUS | Status: AC
  Administered 2024-01-27: 1500 mg via INTRAVENOUS
  Filled 2024-01-27 (×2): qty 300

## 2024-01-27 MED ORDER — VANCOMYCIN HCL IN DEXTROSE 1-5 GM/200ML-% IV SOLN: 1000.0000 mg | INTRAVENOUS | Status: DC

## 2024-01-27 MED ORDER — VANCOMYCIN HCL 1250 MG/250ML IV SOLN: 1250.0000 mg | INTRAVENOUS | Status: DC

## 2024-01-27 MED ORDER — DAPTOMYCIN-SODIUM CHLORIDE 700-0.9 MG/100ML-% IV SOLN
700.0000 mg | Freq: Every day | INTRAVENOUS | Status: DC
  Administered 2024-01-28: 700 mg via INTRAVENOUS
  Filled 2024-01-27 (×2): qty 100

## 2024-01-27 NOTE — Assessment & Plan Note (Addendum)
 01-27-2024 foley changed out in ER. On IV ABX for septicemia with enterococcus and 2 different staph species.  01-28-2024 continue with IV daptomycin  per ID consult.  01-29-2024 ID changed abx over to po Zyvox .  01-30-2024 DC to SNF to complete 10 total days of abx.  Completed 1 day of IV daptomycin . Has had 2 days of in-house Zyvox . Will DC to SNF with 7 days of Zyvox . Hold remeron while on Zyvox . Can restart Remeron on Feb 13, 2024

## 2024-01-27 NOTE — TOC Initial Note (Signed)
 Transition of Care [facility]) - Initial/Assessment Note    Patient Details  Name: Austin Weaver MRN: [id number] Date of Birth: [birth year]  Transition of Care [facility]) CM/SW Contact:    Mamye Bolds A [place], LCSW Phone Number: 01/27/2024, 4:30 PM  Clinical Narrative:                  CSW met with pt at bedside, but pt has legal guardian and was unable to hold conversation.   CSW spoke with pt's Legal Guardian, Dontay. She stated that pt is from [facility], long term care pt and plan to return at discharge.   CSW reached out to facility and confirmed placement at [facility].   Pt is [facility] pt, will notify of discharge when date is established.    TOC will continue to follow.  Expected Discharge Plan: Skilled Nursing Facility Barriers to Discharge: Continued Medical Work up   Patient Goals and CMS Choice            Expected Discharge Plan and Services       Living arrangements for the past 2 months: Skilled Nursing Facility                                      Prior Living Arrangements/Services Living arrangements for the past 2 months: Skilled Nursing Facility Lives with:: Facility Resident          Need for Family Participation in Patient Care: Yes (Comment) Care giver support system in place?: Yes (comment) (Dontay Woodard, legal guardian)      Activities of Daily Living   ADL Screening (condition at time of admission) Independently performs ADLs?: Yes (appropriate for developmental age) Is the patient deaf or have difficulty hearing?: No Does the patient have difficulty seeing, even when wearing glasses/contacts?: No Does the patient have difficulty concentrating, remembering, or making decisions?: No  Permission Sought/Granted                  Emotional Assessment Appearance:: Appears stated age Attitude/Demeanor/Rapport: Unable to Assess Affect (typically observed): Unable to Assess Orientation: : Oriented to Self Alcohol /  Substance Use: Not Applicable Psych Involvement: No (comment)  Admission diagnosis:  UTI (urinary tract infection) [N39.0] AKI (acute kidney injury) (HCC) [N17.9] Urinary tract infection without hematuria, site unspecified [N39.0] Patient Active Problem List   Diagnosis Date Noted   Bacteremia due to Enterococcus 01/27/2024   Bacteremia due to Gram-positive bacteria 01/27/2024   Hospice care patient 01/27/2024   Prostate cancer metastatic to bone (HCC) 01/27/2024   DNR (do not resuscitate)/DNI(Do Not intubate) 01/27/2024   UTI (urinary tract infection) due to urinary indwelling Foley catheter (HCC) 01/26/2024   Vision changes 02/20/2023   Left carotid bruit 02/20/2023   Decreased pulses in feet 02/20/2023   Pedal edema 02/20/2023   Microalbuminuria 02/20/2023   Onychomycosis 05/28/2022   Nicotine dependence, cigarettes, uncomplicated 10/06/2021   Hemiplegia of left nondominant side as late effect of cerebrovascular disease (HCC) 05/01/2021   Lack of access to transportation 05/01/2021   Mixed hyperlipidemia 01/11/2020   Allergies 12/01/2018   Essential hypertension 12/01/2018   Food insecurity 12/01/2018   Alzheimer's dementia with behavioral disturbance (HCC)    Vitamin B12 deficiency 09/22/2018   History of stroke 09/22/2018   PCP:  [place] Pharmacy:   [facility] [place] [place] - [address] [place] [address] Phone: [phone] Fax: [phone]  Social Drivers of Health (SDOH) Social History: SDOH Screenings   Food Insecurity: No Food Insecurity (01/26/2024)  Housing: Low Risk  (01/26/2024)  Transportation Needs: No Transportation Needs (01/26/2024)  Utilities: Not At Risk (01/26/2024)  Depression (PHQ2-9): Low Risk  (07/11/2022)  Financial Resource Strain: Medium Risk (12/01/2018)  Social Connections: Unknown (01/26/2024)  Tobacco Use: High Risk (01/26/2024)   SDOH Interventions:     Readmission Risk  Interventions     No data to display

## 2024-01-27 NOTE — Assessment & Plan Note (Addendum)
 01-27-2024 chronic.  01-28-2024 stable.  01-29-2024 stable.  01-30-2024 stable.

## 2024-01-27 NOTE — Progress Notes (Signed)
 PHARMACY - PHYSICIAN COMMUNICATION CRITICAL VALUE ALERT - BLOOD CULTURE IDENTIFICATION (BCID)  Austin Weaver is an 78 y.o. male who presented to [facility] from SNF on 01/26/2024 with a chief complaint of urosepsis 2/2 chronic urinary catheter  Assessment:  Blood culture growing 3 different gram positive cocci, methicillin resistance detected   Name of physician (or Provider) Contacted:  Dr. Farrel Hones  Current antibiotics:  Rocephin   Changes to prescribed antibiotics recommended:  Change to Vancomycin    Results for orders placed or performed during the hospital encounter of 01/26/24  Blood Culture ID Panel (Reflexed) (Collected: 01/26/2024  1:37 PM)  Result Value Ref Range   Enterococcus faecalis DETECTED (A) NOT DETECTED   Enterococcus Faecium NOT DETECTED NOT DETECTED   Listeria monocytogenes NOT DETECTED NOT DETECTED   Staphylococcus species DETECTED (A) NOT DETECTED   Staphylococcus aureus (BCID) NOT DETECTED NOT DETECTED   Staphylococcus epidermidis DETECTED (A) NOT DETECTED   Staphylococcus lugdunensis DETECTED (A) NOT DETECTED   Streptococcus species NOT DETECTED NOT DETECTED   Streptococcus agalactiae NOT DETECTED NOT DETECTED   Streptococcus pneumoniae NOT DETECTED NOT DETECTED   Streptococcus pyogenes NOT DETECTED NOT DETECTED   A.calcoaceticus-baumannii NOT DETECTED NOT DETECTED   Bacteroides fragilis NOT DETECTED NOT DETECTED   Enterobacterales NOT DETECTED NOT DETECTED   Enterobacter cloacae complex NOT DETECTED NOT DETECTED   Escherichia coli NOT DETECTED NOT DETECTED   Klebsiella aerogenes NOT DETECTED NOT DETECTED   Klebsiella oxytoca NOT DETECTED NOT DETECTED   Klebsiella pneumoniae NOT DETECTED NOT DETECTED   Proteus species NOT DETECTED NOT DETECTED   Salmonella species NOT DETECTED NOT DETECTED   Serratia marcescens NOT DETECTED NOT DETECTED   Haemophilus influenzae NOT DETECTED NOT DETECTED   Neisseria meningitidis NOT DETECTED NOT DETECTED   Pseudomonas  aeruginosa NOT DETECTED NOT DETECTED   Stenotrophomonas maltophilia NOT DETECTED NOT DETECTED   Candida albicans NOT DETECTED NOT DETECTED   Candida auris NOT DETECTED NOT DETECTED   Candida glabrata NOT DETECTED NOT DETECTED   Candida krusei NOT DETECTED NOT DETECTED   Candida parapsilosis NOT DETECTED NOT DETECTED   Candida tropicalis NOT DETECTED NOT DETECTED   Cryptococcus neoformans/gattii NOT DETECTED NOT DETECTED   Methicillin resistance mecA/C DETECTED (A) NOT DETECTED   Vancomycin  resistance NOT DETECTED NOT DETECTED    Carlota Chestnut 01/27/2024  7:28 AM

## 2024-01-27 NOTE — Progress Notes (Signed)
 Pharmacy Antibiotic Note  Jud Fanguy is a 78 y.o. male with metastatic carcinoma and, paraplegia admitted on 01/26/2024 from SNF with urosepsis and bacteremia .  Pharmacy has been consulted for Vancomycin   dosing.  Plan: Vancomycin  1500 mg IV now, then Vancomycin  1000 mg IV q48h  Height: 5\' 6"  (167.6 cm) Weight: 68 kg (150 lb) IBW/kg (Calculated) : 63.8  Temp (24hrs), Avg:99.1 F (37.3 C), Min:98.5 F (36.9 C), Max:99.4 F (37.4 C)  Recent Labs  Lab 01/26/24 1316 01/26/24 1337 01/26/24 1514 01/26/24 1958 01/27/24 0619  WBC  --  17.9*  --   --  12.6*  CREATININE 1.50* 1.42*  --   --   --   LATICACIDVEN 3.6*  --  1.1 1.3  --     Estimated Creatinine Clearance: 39.9 mL/min (A) (by C-G formula based on SCr of 1.42 mg/dL (H)).    No Known Allergies   Carlota Chestnut 01/27/2024 7:34 AM

## 2024-01-27 NOTE — Consult Note (Signed)
 [facility]    Date of Admission:  01/26/2024     Total days of antibiotics 1                Reason for Consult: Polymicrobial Bacteremia    Referring Provider: autoconsultation  Primary Care Provider: [place]   Assessment: Austin Weaver is a 78 y.o. male admitted from SN;   Polymicrobial Bacteremia -  Enterococcus Faecalis, Staph Epidermidis, Staph Lugdunensis with MecA Gene -  One site drawn from right thumb has returned positive for GPCs with BCID reflecting the above. Growing from both aerobic/anaerobic bottles. May be r/t GU/catheter site breakdown vs venipuncture contamination.  No replaced joints, cardiac devices or hardware in body.  DENOVA score conservatively 2 which is not high risk for endocarditis. Will order TTE for completeness but I don't expect we will need TEE here.  Will use daptomycin  to cover bacteremia. On exam  he has some mucoid discharge from urethra noted. Urine cultures with multiple species, large WBC on U/A.  -daptomycin  for treatment  -TTE  -Repeat blood cultures in AM   Hospice / Terminal Metastatic Cancer -  T8 vertebra of unknown primary site with epidural spread resulting in spinal cord compression, paraplegia and contractures.  - [facility] Following     Plan: -daptomycin  for treatment  -TTE  -Repeat blood cultures in AM   Principal Problem:   UTI (urinary tract infection) Active Problems:   Bacteremia due to Enterococcus   Bacteremia due to Gram-positive bacteria    atorvastatin   40 mg Oral QHS   fluticasone   2 spray Each Nare Daily   heparin   5,000 Units Subcutaneous Q8H   pantoprazole  (PROTONIX ) IV  40 mg Intravenous Q12H    HPI: Austin Weaver is a 78 y.o. male admitted from [facility] for evaluation of coffee ground emesis x 1 and mucopurulent drainage from penis that persisted after catheter was replaced 4/22. Legal guardian requested evaluation   Review from  chart completed as patient is unable to really help facilitate history.   PMHx:  TIA and CVA, recurrent falls, metastatic carcinoma of unknown primary site but has a T8 vertebral (unknown if primary site or metastasis) body mass with epidural spread causing severe spinal canal stenosis and compression of the spinal cord with resultant flaccid paraplegia and urinary retention, hypertension, hyperlipidemia   Resident at [facility] and currently on hospice. He was sent to the ER for evaluation of purulent urethral discharge and coffee ground emesis. Mild leukocytosis, lactate elevated and triggered sepsis protocol. Blood cultures were collected and have detected E Faecalis, Staph Lugdunensis and staph epidermidis with mecA gene detected.   He has received a dose of ceftriaxone , azithromycin  and vancomycin  for concern over UTI    Review of Systems: ROS  Past Medical History:  Diagnosis Date   Allergy    Fall 09/21/2018   History of transient ischemic attack (TIA) 11/20/2017   History of transient ischemic attack (TIA) 11/20/2017   Hypertension    Old Cerebral infarction (HCC) 09/21/2018   Remote L. thalamic and R. external capsul & corona radiata infarcts   Stroke [facility])     Social History   Tobacco Use   Smoking status: Every Day    Current packs/day: 0.50    Average packs/day: 0.5 packs/day for 50.0 years (25.0 ttl pk-yrs)    Types: Cigarettes   Smokeless tobacco: Never  Vaping Use   Vaping status: Never Used  Substance Use Topics   Alcohol use: No   Drug use: Never    Family History  Problem Relation Age of Onset   Hypertension Mother    Diabetes Mother    Stroke Mother    Stroke Father    Stroke Maternal Grandmother    Stroke Maternal Grandfather    Cancer Maternal Grandfather        unknown type   Stroke Paternal Grandmother    Stroke Paternal Grandfather    Kidney failure Son    No Known Allergies  OBJECTIVE: Blood pressure 119/67, pulse 73, temperature  98.7 F (37.1 C), temperature source Oral, resp. rate 17, height 5\' 6"  (1.676 m), weight 68 kg, SpO2 100%.  Physical Exam Constitutional:      Appearance: He is ill-appearing.  Cardiovascular:     Rate and Rhythm: Normal rate and regular rhythm.     Heart sounds: No murmur heard. Abdominal:     General: Bowel sounds are normal.  Genitourinary:    Comments: Mucopurulent drainage from urethral meatus in uncircumcised male  Musculoskeletal:     Cervical back: Normal range of motion.     Comments: Edema bilateral thighs/knees. Contracted legs. Boots on feet in place for prophylaxis.   Skin:    General: Skin is warm and dry.     Findings: No rash.  Neurological:     Mental Status: He is alert. He is disoriented.     Lab Results Lab Results  Component Value Date   WBC 12.6 (H) 01/27/2024   HGB 9.4 (L) 01/27/2024   HCT 29.2 (L) 01/27/2024   MCV 86.9 01/27/2024   PLT 310 01/27/2024    Lab Results  Component Value Date   CREATININE 0.86 01/27/2024   BUN 17 01/27/2024   NA 137 01/27/2024   K 3.1 (L) 01/27/2024   CL 105 01/27/2024   CO2 20 (L) 01/27/2024    Lab Results  Component Value Date   ALT 8 01/27/2024   AST 13 (L) 01/27/2024   ALKPHOS 45 01/27/2024   BILITOT 0.8 01/27/2024     Microbiology: Recent Results (from the past 240 hours)  Resp panel by RT-PCR (RSV, Flu A&B, Covid) Anterior Nasal Swab     Status: None   Collection Time: 01/26/24 12:18 PM   Specimen: Anterior Nasal Swab  Result Value Ref Range Status   SARS Coronavirus 2 by RT PCR NEGATIVE NEGATIVE Final   Influenza A by PCR NEGATIVE NEGATIVE Final   Influenza B by PCR NEGATIVE NEGATIVE Final    Comment: (NOTE) The Xpert Xpress SARS-CoV-2/FLU/RSV plus assay is intended as an aid in the diagnosis of influenza from Nasopharyngeal swab specimens and should not be used as a sole basis for treatment. Nasal washings and aspirates are unacceptable for Xpert Xpress SARS-CoV-2/FLU/RSV testing.  Fact Sheet  for Patients: BloggerCourse.com  Fact Sheet for Healthcare Providers: SeriousBroker.it  This test is not yet approved or cleared by the [place]  FDA and has been authorized for detection and/or diagnosis of SARS-CoV-2 by FDA under an Emergency Use Authorization (EUA). This EUA will remain in effect (meaning this test can be used) for the duration of the COVID-19 declaration under Section 564(b)(1) of the Act, 21 U.S.C. section 360bbb-3(b)(1), unless the authorization is terminated or revoked.     Resp Syncytial Virus by PCR NEGATIVE NEGATIVE Final    Comment: (NOTE) Fact Sheet for Patients: BloggerCourse.com  Fact Sheet for Healthcare Providers: SeriousBroker.it  This test is not yet approved or cleared  by the [place]  FDA and has been authorized for detection and/or diagnosis of SARS-CoV-2 by FDA under an Emergency Use Authorization (EUA). This EUA will remain in effect (meaning this test can be used) for the duration of the COVID-19 declaration under Section 564(b)(1) of the Act, 21 U.S.C. section 360bbb-3(b)(1), unless the authorization is terminated or revoked.  Performed at [facility], [address] [place], [place] [address]   Culture, blood (routine x 2)     Status: None (Preliminary result)   Collection Time: 01/26/24  1:37 PM   Specimen: BLOOD RIGHT HAND  Result Value Ref Range Status   Specimen Description BLOOD RIGHT HAND  Final   Special Requests   Final    BOTTLES DRAWN AEROBIC AND ANAEROBIC Blood Culture results may not be optimal due to an inadequate volume of blood received in culture bottles   Culture   Final    NO GROWTH < 24 HOURS Performed at [facility], [address], [place], [place] [address]    Report Status PENDING  Incomplete  Culture, blood (routine x 2)     Status: None (Preliminary result)   Collection Time: 01/26/24   1:37 PM   Specimen: BLOOD  Result Value Ref Range Status   Specimen Description BLOOD THUMB  Final   Special Requests   Final    BOTTLES DRAWN AEROBIC AND ANAEROBIC Blood Culture results may not be optimal due to an inadequate volume of blood received in culture bottles   Culture  Setup Time   Final    GRAM POSITIVE COCCI IN BOTH AEROBIC AND ANAEROBIC BOTTLES CRITICAL RESULT CALLED TO, READ BACK BY AND VERIFIED WITH: PHARMD G ABBOTT 01/27/2024 @ 0725 BY AB Performed at [facility], [address] [place], [place] [address]    Culture GRAM POSITIVE COCCI  Final   Report Status PENDING  Incomplete  Blood Culture ID Panel (Reflexed)     Status: Abnormal   Collection Time: 01/26/24  1:37 PM  Result Value Ref Range Status   Enterococcus faecalis DETECTED (A) NOT DETECTED Final    Comment: CRITICAL RESULT CALLED TO, READ BACK BY AND VERIFIED WITH: PHARMD G ABBOTT 01/27/2024 @ 0725 BY AB    Enterococcus Faecium NOT DETECTED NOT DETECTED Final   Listeria monocytogenes NOT DETECTED NOT DETECTED Final   Staphylococcus species DETECTED (A) NOT DETECTED Final    Comment: CRITICAL RESULT CALLED TO, READ BACK BY AND VERIFIED WITH: PHARMD G ABBOTT 01/27/2024 @ 0725 BY AB    Staphylococcus aureus (BCID) NOT DETECTED NOT DETECTED Final   Staphylococcus epidermidis DETECTED (A) NOT DETECTED Final    Comment: Methicillin (oxacillin) resistant coagulase negative staphylococcus. Possible blood culture contaminant (unless isolated from more than one blood culture draw or clinical case suggests pathogenicity). No antibiotic treatment is indicated for blood  culture contaminants. CRITICAL RESULT CALLED TO, READ BACK BY AND VERIFIED WITH: PHARMD G ABBOTT 01/27/2024 @ 0725 BY AB    Staphylococcus lugdunensis DETECTED (A) NOT DETECTED Final    Comment: Methicillin (oxacillin) resistant coagulase negative staphylococcus. Possible blood culture contaminant (unless isolated from more than one blood  culture draw or clinical case suggests pathogenicity). No antibiotic treatment is indicated for blood  culture contaminants. CRITICAL RESULT CALLED TO, READ BACK BY AND VERIFIED WITH: PHARMD G ABBOTT 01/27/2024 @ 0725 BY AB    Streptococcus species NOT DETECTED NOT DETECTED Final   Streptococcus agalactiae NOT DETECTED NOT DETECTED Final   Streptococcus pneumoniae  NOT DETECTED NOT DETECTED Final   Streptococcus pyogenes NOT DETECTED NOT DETECTED Final   A.calcoaceticus-baumannii NOT DETECTED NOT DETECTED Final   Bacteroides fragilis NOT DETECTED NOT DETECTED Final   Enterobacterales NOT DETECTED NOT DETECTED Final   Enterobacter cloacae complex NOT DETECTED NOT DETECTED Final   Escherichia coli NOT DETECTED NOT DETECTED Final   Klebsiella aerogenes NOT DETECTED NOT DETECTED Final   Klebsiella oxytoca NOT DETECTED NOT DETECTED Final   Klebsiella pneumoniae NOT DETECTED NOT DETECTED Final   Proteus species NOT DETECTED NOT DETECTED Final   Salmonella species NOT DETECTED NOT DETECTED Final   Serratia marcescens NOT DETECTED NOT DETECTED Final   Haemophilus influenzae NOT DETECTED NOT DETECTED Final   Neisseria meningitidis NOT DETECTED NOT DETECTED Final   Pseudomonas aeruginosa NOT DETECTED NOT DETECTED Final   Stenotrophomonas maltophilia NOT DETECTED NOT DETECTED Final   Candida albicans NOT DETECTED NOT DETECTED Final   Candida auris NOT DETECTED NOT DETECTED Final   Candida glabrata NOT DETECTED NOT DETECTED Final   Candida krusei NOT DETECTED NOT DETECTED Final   Candida parapsilosis NOT DETECTED NOT DETECTED Final   Candida tropicalis NOT DETECTED NOT DETECTED Final   Cryptococcus neoformans/gattii NOT DETECTED NOT DETECTED Final   Methicillin resistance mecA/C DETECTED (A) NOT DETECTED Final    Comment: CRITICAL RESULT CALLED TO, READ BACK BY AND VERIFIED WITH: PHARMD G ABBOTT 01/27/2024 @ 0725 BY AB    Vancomycin  resistance NOT DETECTED NOT DETECTED Final    Comment:  Performed at [facility], [address], [place], [place] [address]  Remove and replace urinary cath (placed > 5 days) then obtain urine culture from new indwelling urinary catheter.     Status: Abnormal   Collection Time: 01/26/24  3:14 PM   Specimen: Urine, Catheterized  Result Value Ref Range Status   Specimen Description URINE, CATHETERIZED  Final   Special Requests   Final    NONE Performed at [facility], [address], [place], [place] [address]    Culture MULTIPLE SPECIES PRESENT, SUGGEST RECOLLECTION (A)  Final   Report Status 01/27/2024 FINAL  Final    Gibson Kurtz, MSN, NP-C [facility] Pager: [phone]  01/27/2024 2:25 PM

## 2024-01-27 NOTE — Assessment & Plan Note (Addendum)
 01-27-2024 stable. Hold hydrochlorothiazide  for now. BP stable without HTN meds.  01-28-2024 stable. Restart low dose norvasc .  01-29-2024 stable. BP improved with restarting norvasc .  Still holding hydrochlorothiazide .  01-30-2024 stable.BP stable on norvasc  alone. Stop hydrochlorothiazide  at discharge.

## 2024-01-27 NOTE — Assessment & Plan Note (Addendum)
 01-27-2024 pt is a hospice client.  01-28-2024 stable.  01-29-2024 stable.  01-30-2024 stable. Continue DNR.

## 2024-01-27 NOTE — Progress Notes (Addendum)
 PROGRESS NOTE    Findlay Dagher  ZOX:[id number] DOB: 07/20/1946 DOA: 01/26/2024 PCP: [place] [facility]  Subjective: Pt seen and examined. No further vomiting of blood. I have cancel GI consultation.  Blood cx positive for enterococcus and staph epidermidis, staph lugdunensis.  On IV daptomycin  for now.  Pt is awake. No complaints.  Pt is in hospice for his history of metastatic prostate cancer to the bone.   Hospital Course: HPI: Jakaleb Payer is a 78 y.o. male with medical history significant for but not limited to history of TIA and CVA, recurrent falls, metastatic carcinoma of unknown primary site but has a T8 vertebral (unknown if primary site or metastasis) body mass with epidural spread causing severe spinal canal stenosis and compression of the spinal cord with resultant flaccid paraplegia and urinary retention, hypertension, hyperlipidemia who is currently on hospice who presents from his nursing facility for a single episode of coffee-ground emesis in the morning as well as mucopurulent discharge from his penis and cloudy urine.  Patient's facility tried to change his catheter but had difficulty if I was able to do so.  Given his coffee-ground emesis and purulent urine they sent him for further evaluation to the ED.  Patient is a poor historian himself and has a history of dementia and feels okay at this time.  The case was discussed with his legal guardian who wants further evaluation and workup and is okay with antibiotics, fluids as well as further investigation including EGD.  Patient was found to have purulent urine and was placed on the PPI drip and [facility] was asked to admit this patient for urinary tract infection and upon evaluation patient was found to be in severe sepsis given his lactic acid level being elevated so sepsis protocol initiated on the patient.   Significant Events: Admitted 01/26/2024 for UTI due to indwelling foley catheter   Significant Labs: VBG ph 7.65, PCO2  15 Lactic acid 3.6 WBC 17.9, HgB 11, Plt 340 Na 137, K 3.7, BUN 27, Scr 1.42, glu 106 Iron 19, TIB 165, %sat 12 Blood cx 01-26-2024 staph epi, staph lugdunensis, enterococcus faecalis  Significant Imaging Studies: CXR Mild right basilar opacity as noted above  Renal U/S Limited study. The bladder and right kidney are not imaged at this time. Left kidney has some cysts as seen previously but is also incompletely evaluated. Repeat attempt can be performed when the patient is more clinically able.  Antibiotic Therapy: Anti-infectives (From admission, onward)    Start     Dose/Rate Route Frequency Ordered Stop   01/29/24 1000  vancomycin  (VANCOCIN ) IVPB 1000 mg/200 mL premix        1,000 mg 200 mL/hr over 60 Minutes Intravenous Every 48 hours 01/27/24 0741     01/27/24 1200  cefTRIAXone  (ROCEPHIN ) 1 g in sodium chloride  0.9 % 100 mL IVPB  Status:  Discontinued        1 g 200 mL/hr over 30 Minutes Intravenous Every 24 hours 01/26/24 1547 01/27/24 0741   01/27/24 0830  vancomycin  (VANCOREADY) IVPB 1500 mg/300 mL        1,500 mg 150 mL/hr over 120 Minutes Intravenous  Once 01/27/24 0741     01/26/24 1600  azithromycin  (ZITHROMAX ) 500 mg in sodium chloride  0.9 % 250 mL IVPB  Status:  Discontinued        500 mg 250 mL/hr over 60 Minutes Intravenous Every 24 hours 01/26/24 1547 01/27/24 0741   01/26/24 1230  cefTRIAXone  (ROCEPHIN ) 1 g in sodium chloride   0.9 % 100 mL IVPB        1 g 200 mL/hr over 30 Minutes Intravenous  Once 01/26/24 1217 01/26/24 1450       Procedures:   Consultants: ID    Assessment and Plan: * UTI (urinary tract infection) due to urinary indwelling Foley catheter (HCC) 01-27-2024 foley changed out in ER. On IV ABX for septicemia with enterococcus and 2 different staph species.  Bacteremia due to Enterococcus 01-27-2024 foley changed out in ER. On IV ABX for septicemia with enterococcus and 2 different staph species.  Bacteremia due to Gram-positive  bacteria 01-27-2024 foley changed out in ER. On IV ABX for septicemia with enterococcus and 2 different staph species. ID has ordered surface echo.  Prostate cancer metastatic to bone (HCC) 01-27-2024 chronic.  Hospice care patient 01-27-2024 pt is a hospice client.  Essential hypertension 01-27-2024 stable. Hold hydrochlorothiazide  for now. BP stable without HTN meds.  Alzheimer's dementia with behavioral disturbance (HCC) 01-27-2024 chronic.  DNR (do not resuscitate)/DNI(Do Not intubate)   DVT prophylaxis: heparin  injection 5,000 Units Start: 01/26/24 2200 SCDs Start: 01/26/24 1859    Code Status: Limited: Do not attempt resuscitation (DNR) -DNR-LIMITED -Do Not Intubate/DNI  Family Communication: no family at bedside Disposition Plan: return to SNF Reason for continuing need for hospitalization: remains on IV ABX. Awaiting echo.  Objective: Vitals:   01/26/24 2355 01/27/24 0426 01/27/24 0810 01/27/24 1209  BP: 115/67 120/69 118/65 119/67  Pulse: 93 77 72 73  Resp: 18 16 17 17   Temp: 99.2 F (37.3 C) 98.9 F (37.2 C) 98.6 F (37 C) 98.7 F (37.1 C)  TempSrc: Oral Oral Oral Oral  SpO2: 99% 100%    Weight:      Height:        Intake/Output Summary (Last 24 hours) at 01/27/2024 1503 Last data filed at 01/27/2024 0300 Gross per 24 hour  Intake 2101.73 ml  Output 800 ml  Net 1301.73 ml   Filed Weights   01/26/24 1927  Weight: 68 kg    Examination:  Physical Exam Vitals and nursing note reviewed.  Constitutional:      General: He is not in acute distress.    Appearance: He is not toxic-appearing or diaphoretic.     Comments: Chronically ill appearing  HENT:     Head: Normocephalic and atraumatic.  Eyes:     General: No scleral icterus. Cardiovascular:     Rate and Rhythm: Normal rate and regular rhythm.  Pulmonary:     Effort: Pulmonary effort is normal.     Breath sounds: Normal breath sounds.  Abdominal:     General: Bowel sounds are normal.      Palpations: Abdomen is soft.  Genitourinary:    Comments: +foley Skin:    General: Skin is warm and dry.     Capillary Refill: Capillary refill takes less than 2 seconds.  Neurological:     Comments: Awake and alert. Not oriented to place or time.     Data Reviewed: I have personally reviewed following labs and imaging studies  CBC: Recent Labs  Lab 01/26/24 1316 01/26/24 1337 01/26/24 2347 01/27/24 0619 01/27/24 1314  WBC  --  17.9*  --  12.6*  --   NEUTROABS  --  15.3*  --  9.2*  --   HGB 12.2*  12.2* 11.0* 9.6* 9.4* 9.4*  HCT 36.0*  36.0* 34.4* 29.5* 29.2* 29.1*  MCV  --  87.3  --  86.9  --   PLT  --  340  --  310  --    Basic Metabolic Panel: Recent Labs  Lab 01/26/24 1316 01/26/24 1337 01/27/24 0619  NA 138  138 137 137  K 4.1  4.2 3.7 3.1*  CL 110 104 105  CO2  --  22 20*  GLUCOSE 97 106* 90  BUN 31* 27* 17  CREATININE 1.50* 1.42* 0.86  CALCIUM   --  8.7* 8.2*   GFR: Estimated Creatinine Clearance: 65.9 mL/min (by C-G formula based on SCr of 0.86 mg/dL). Liver Function Tests: Recent Labs  Lab 01/26/24 1337 01/27/24 0619  AST 15 13*  ALT 10 8  ALKPHOS 58 45  BILITOT 0.8 0.8  PROT 7.6 6.4*  ALBUMIN 2.5* 2.2*   Coagulation Profile: Recent Labs  Lab 01/26/24 1958  INR 1.3*   CBG: Recent Labs  Lab 01/26/24 1312  GLUCAP 110*   Thyroid  Function Tests: Recent Labs    01/27/24 0619  TSH 0.516   Anemia Panel: Recent Labs    01/26/24 1417 01/26/24 1600  VITAMINB12  --  264  FOLATE  --  10.2  FERRITIN 1,053*  --   TIBC 165*  --   IRON 19*  --   RETICCTPCT  --  1.5   Sepsis Labs: Recent Labs  Lab 01/26/24 1316 01/26/24 1417 01/26/24 1514 01/26/24 1958  PROCALCITON  --  0.71  --   --   LATICACIDVEN 3.6*  --  1.1 1.3    Recent Results (from the past 240 hours)  Resp panel by RT-PCR (RSV, Flu A&B, Covid) Anterior Nasal Swab     Status: None   Collection Time: 01/26/24 12:18 PM   Specimen: Anterior Nasal Swab  Result Value  Ref Range Status   SARS Coronavirus 2 by RT PCR NEGATIVE NEGATIVE Final   Influenza A by PCR NEGATIVE NEGATIVE Final   Influenza B by PCR NEGATIVE NEGATIVE Final    Comment: (NOTE) The Xpert Xpress SARS-CoV-2/FLU/RSV plus assay is intended as an aid in the diagnosis of influenza from Nasopharyngeal swab specimens and should not be used as a sole basis for treatment. Nasal washings and aspirates are unacceptable for Xpert Xpress SARS-CoV-2/FLU/RSV testing.  Fact Sheet for Patients: BloggerCourse.com  Fact Sheet for Healthcare Providers: SeriousBroker.it  This test is not yet approved or cleared by the [place]  FDA and has been authorized for detection and/or diagnosis of SARS-CoV-2 by FDA under an Emergency Use Authorization (EUA). This EUA will remain in effect (meaning this test can be used) for the duration of the COVID-19 declaration under Section 564(b)(1) of the Act, 21 U.S.C. section 360bbb-3(b)(1), unless the authorization is terminated or revoked.     Resp Syncytial Virus by PCR NEGATIVE NEGATIVE Final    Comment: (NOTE) Fact Sheet for Patients: BloggerCourse.com  Fact Sheet for Healthcare Providers: SeriousBroker.it  This test is not yet approved or cleared by the [place]  FDA and has been authorized for detection and/or diagnosis of SARS-CoV-2 by FDA under an Emergency Use Authorization (EUA). This EUA will remain in effect (meaning this test can be used) for the duration of the COVID-19 declaration under Section 564(b)(1) of the Act, 21 U.S.C. section 360bbb-3(b)(1), unless the authorization is terminated or revoked.  Performed at [facility], [address] [place], [place] [address]   Culture, blood (routine x 2)     Status: None (Preliminary result)   Collection Time: 01/26/24  1:37 PM   Specimen: BLOOD RIGHT HAND  Result Value Ref Range  Status  Specimen Description BLOOD RIGHT HAND  Final   Special Requests   Final    BOTTLES DRAWN AEROBIC AND ANAEROBIC Blood Culture results may not be optimal due to an inadequate volume of blood received in culture bottles   Culture   Final    NO GROWTH < 24 HOURS Performed at [facility], [address] [place], [place] [address]    Report Status PENDING  Incomplete  Culture, blood (routine x 2)     Status: None (Preliminary result)   Collection Time: 01/26/24  1:37 PM   Specimen: BLOOD  Result Value Ref Range Status   Specimen Description BLOOD THUMB  Final   Special Requests   Final    BOTTLES DRAWN AEROBIC AND ANAEROBIC Blood Culture results may not be optimal due to an inadequate volume of blood received in culture bottles   Culture  Setup Time   Final    GRAM POSITIVE COCCI IN BOTH AEROBIC AND ANAEROBIC BOTTLES CRITICAL RESULT CALLED TO, READ BACK BY AND VERIFIED WITH: PHARMD G ABBOTT 01/27/2024 @ 0725 BY AB Performed at [facility], [address], [place], [place] [address]    Culture GRAM POSITIVE COCCI  Final   Report Status PENDING  Incomplete  Blood Culture ID Panel (Reflexed)     Status: Abnormal   Collection Time: 01/26/24  1:37 PM  Result Value Ref Range Status   Enterococcus faecalis DETECTED (A) NOT DETECTED Final    Comment: CRITICAL RESULT CALLED TO, READ BACK BY AND VERIFIED WITH: PHARMD G ABBOTT 01/27/2024 @ 0725 BY AB    Enterococcus Faecium NOT DETECTED NOT DETECTED Final   Listeria monocytogenes NOT DETECTED NOT DETECTED Final   Staphylococcus species DETECTED (A) NOT DETECTED Final    Comment: CRITICAL RESULT CALLED TO, READ BACK BY AND VERIFIED WITH: PHARMD G ABBOTT 01/27/2024 @ 0725 BY AB    Staphylococcus aureus (BCID) NOT DETECTED NOT DETECTED Final   Staphylococcus epidermidis DETECTED (A) NOT DETECTED Final    Comment: Methicillin (oxacillin) resistant coagulase negative staphylococcus. Possible blood culture contaminant (unless  isolated from more than one blood culture draw or clinical case suggests pathogenicity). No antibiotic treatment is indicated for blood  culture contaminants. CRITICAL RESULT CALLED TO, READ BACK BY AND VERIFIED WITH: PHARMD G ABBOTT 01/27/2024 @ 0725 BY AB    Staphylococcus lugdunensis DETECTED (A) NOT DETECTED Final    Comment: Methicillin (oxacillin) resistant coagulase negative staphylococcus. Possible blood culture contaminant (unless isolated from more than one blood culture draw or clinical case suggests pathogenicity). No antibiotic treatment is indicated for blood  culture contaminants. CRITICAL RESULT CALLED TO, READ BACK BY AND VERIFIED WITH: PHARMD G ABBOTT 01/27/2024 @ 0725 BY AB    Streptococcus species NOT DETECTED NOT DETECTED Final   Streptococcus agalactiae NOT DETECTED NOT DETECTED Final   Streptococcus pneumoniae NOT DETECTED NOT DETECTED Final   Streptococcus pyogenes NOT DETECTED NOT DETECTED Final   A.calcoaceticus-baumannii NOT DETECTED NOT DETECTED Final   Bacteroides fragilis NOT DETECTED NOT DETECTED Final   Enterobacterales NOT DETECTED NOT DETECTED Final   Enterobacter cloacae complex NOT DETECTED NOT DETECTED Final   Escherichia coli NOT DETECTED NOT DETECTED Final   Klebsiella aerogenes NOT DETECTED NOT DETECTED Final   Klebsiella oxytoca NOT DETECTED NOT DETECTED Final   Klebsiella pneumoniae NOT DETECTED NOT DETECTED Final   Proteus species NOT DETECTED NOT DETECTED Final   Salmonella species NOT DETECTED NOT DETECTED Final   Serratia marcescens NOT DETECTED NOT DETECTED  Final   Haemophilus influenzae NOT DETECTED NOT DETECTED Final   Neisseria meningitidis NOT DETECTED NOT DETECTED Final   Pseudomonas aeruginosa NOT DETECTED NOT DETECTED Final   Stenotrophomonas maltophilia NOT DETECTED NOT DETECTED Final   Candida albicans NOT DETECTED NOT DETECTED Final   Candida auris NOT DETECTED NOT DETECTED Final   Candida glabrata NOT DETECTED NOT DETECTED Final    Candida krusei NOT DETECTED NOT DETECTED Final   Candida parapsilosis NOT DETECTED NOT DETECTED Final   Candida tropicalis NOT DETECTED NOT DETECTED Final   Cryptococcus neoformans/gattii NOT DETECTED NOT DETECTED Final   Methicillin resistance mecA/C DETECTED (A) NOT DETECTED Final    Comment: CRITICAL RESULT CALLED TO, READ BACK BY AND VERIFIED WITH: PHARMD G ABBOTT 01/27/2024 @ 0725 BY AB    Vancomycin  resistance NOT DETECTED NOT DETECTED Final    Comment: Performed at [facility], [address] [place], [place] [address]  Remove and replace urinary cath (placed > 5 days) then obtain urine culture from new indwelling urinary catheter.     Status: Abnormal   Collection Time: 01/26/24  3:14 PM   Specimen: Urine, Catheterized  Result Value Ref Range Status   Specimen Description URINE, CATHETERIZED  Final   Special Requests   Final    NONE Performed at [facility], [address], [place], [place] [address]    Culture MULTIPLE SPECIES PRESENT, SUGGEST RECOLLECTION (A)  Final   Report Status 01/27/2024 FINAL  Final     Radiology Studies: X-ray chest PA and lateral Result Date: 01/27/2024 CLINICAL DATA:  [id number] CAP (community acquired pneumonia) [id number] EXAM: CHEST - 2 VIEW COMPARISON:  January 26, 2024 FINDINGS: Small to moderate volume right pleural effusion with right basilar atelectasis. No pneumothorax. Mild cardiomegaly. Tortuous aorta with aortic atherosclerosis. No acute fracture or destructive lesions. Multilevel thoracic osteophytosis. IMPRESSION: Small to moderate volume right pleural effusion with right basilar atelectasis. Electronically Signed   By: Rance Burrows M.D.   On: 01/27/2024 09:08   US  RENAL Result Date: 01/26/2024 CLINICAL DATA:  Acute kidney injury. EXAM: RENAL / URINARY TRACT ULTRASOUND COMPLETE COMPARISON:  Ultrasound 09/22/2018 FINDINGS: Study is limited as the patient had difficulty tolerating the procedure due to discomfort and body  contraction. Poor sonographic window. The right kidney is not imaged. Left kidney grossly measures 9.3 x 6.3 x 6.1 cm. Volume 184 cc. Cystic areas are identified along the kidney measuring up to 6.6 cm and is smaller measuring 2.2 cm. The larger focus appears relatively simple in the smaller has some thin septations. These were seen previously. Bladder is not imaged. IMPRESSION: Limited study. The bladder and right kidney are not imaged at this time. Left kidney has some cysts as seen previously but is also incompletely evaluated. Repeat attempt can be performed when the patient is more clinically able. Electronically Signed   By: Adrianna Horde M.D.   On: 01/26/2024 17:00   DG Chest Port 1 View Result Date: 01/26/2024 CLINICAL DATA:  Weakness. EXAM: PORTABLE CHEST 1 VIEW COMPARISON:  May 18, 2023. FINDINGS: Stable cardiomegaly. Left lung is clear. Mild right basilar atelectasis or infiltrate is noted with associated effusion. Bony thorax is unremarkable. IMPRESSION: Mild right basilar opacity as noted above. Electronically Signed   By: Rosalene Colon M.D.   On: 01/26/2024 15:24    Scheduled Meds:  atorvastatin   40 mg Oral QHS   fluticasone   2 spray Each Nare Daily   heparin   5,000 Units Subcutaneous Q8H  pantoprazole  (PROTONIX ) IV  40 mg Intravenous Q12H   Continuous Infusions:  sodium chloride  100 mL/hr at 01/27/24 0646   [START ON 01/28/2024] DAPTOmycin        LOS: 1 day   Time spent: 50 minutes  Unk Garb, DO  [facility]  01/27/2024, 3:03 PM

## 2024-01-27 NOTE — Progress Notes (Signed)
 [name] [id number]-[facility] Patient Visit  [name] is a current hospice patient, with hospice diagnosis of 2ndary malignant neoplasm of unspecified site. He was admitted 01/26/2024 with diagnosis of UTI. [facility] was notified of transfer to the hospital by the ED provider. Per Dr. Tessie Fila, hospice physician, this is a related hospital admission.   Visited with patient in hospital. He is easily aroused and alert but disoriented. Reoriented and discussed plan of care with him. He currently denies unmanaged symptom complaint. Attempted to contact patient's guardian, Austin Weaver. Voicemail left requesting a return call.   Patient is GIP appropriate for evaluation of and treatment of infection including IV fluids and antibiotics.  Vital Signs: 98.6/72/17   118/65    O2 100% on RA  I&O: 2101/800  Abnormal labs: ABG PH 7.657, pCO2 16.3, pO2 193, BUN 27, Cre 1.42, iron 19, tibc 162, lactic acid 3.6, 1.1, K+3.1, Ca 8.2, alb 2.2, t.pro 6.4, WBC 17.9, 12.6, HGB 12.2, 9.4, UA positive for leukocytes, protein, culture pending, BC pending but positive so far for gram pos cocci.  Diagnostics:  Portable chest xray, FINDINGS: Stable cardiomegaly. Left lung is clear. Mild right basilar atelectasis or infiltrate is noted with associated effusion. Bony thorax is unremarkable.   Renal US , IMPRESSION: Limited study. The bladder and right kidney are not imaged at this time.   Left kidney has some cysts as seen previously but is also incompletely evaluated.   Repeat attempt can be performed when the patient is more clinically able.  Chest xray, IMPRESSION: Small to moderate volume right pleural effusion with right basilar atelectasis.  IV/PRN Meds: NS IV bolus total of 1500ml, NS at 100ml/hr, protonix  40mg  IV Q12H, Zithromax  500mg  IV x 1, Rocephin  1G IV x 1, Vancomycin  1500mg  IV x 1.    Assessment and Plan from note 4.22.25 Severe Sepsis 2/2 to Catheter  Associated UTI w/ Urinary Retention with ? Right Lung PNA: Admit to Inpatient Telemetry. LA was 3.6. WBC was 17.9. Tachycardic of 109. Tachypenic with RR of 27. Source of Infection likely Urine (EDP ordered and hasn't sent but states purulent). Given 500 mL bolus by EDP. Will bolus an additional 1 Liter. Obtain STAT Urine and Urine Cx as EDP ordered Abx w IV CTX. C/w mIVF with NS at 100 mL/hr. Trend LA and Check PCT. Check Blood Cx x2. CXR done and showed Mild right basilar atelectasis or infiltrate is noted with associated effusion. C/w Supportive care and repeat CXR in the AM. Given Concern for PNA will C/w IV CTX and add IV Azithromycin     AKI: BUN/Cr Trend: Last Labs    -Check Renal U/S, UA, Urine Cx and exchange foley -Fluids as above -Avoid Nephrotoxic Medications, Contrast Dyes, Hypotension and Dehydration to Ensure Adequate Renal Perfusion and will need to Renally Adjust Meds -Continue to Monitor and Trend Renal Function carefully and repeat CMP in the AM    Coffee Ground Emesis: Had an episode at his facility this morning.  Placed on IV PPI.  Discussed the case with his legal guardian Austin Weaver and she has elected for further evaluation to treat the treatable.  GI consulted and he is made n.p.o. at midnight in case they take him for EGD.  Will allow for clear liquid diet before that.  Continue to monitor hemoglobin/hematocrit.  And continue with supportive care and antiemetics.  His aspirin  has not been held.   Metastatic carcinoma of unknown primary site; T8 vertebral body malignancy with epidural spread causing severe  spinal canal stenosis and compression of spinal cord with resultant flaccid paraplegia and urinary retention: She is status post high-dose Decadron  trial and unfortunately had no motor improvement.  Radiation oncology evaluated at last hospitalization and felt that this was irreversible.  Patient has since then been on hospice.  Continues to have contractures and has a indwelling  Foley catheter which is the cause of infection as above.  [facility] is notified and following   Essential HTN: Hold Hydrochlorothiazide  12.5 mg po Daily given AKI. Hold Amlodipine  10 mg po daily due to Severe Sepsis. CTM BP per Protocol and if necessary place on IV PRN Antihypertensives. Last BP reading was 126/67    Dementia-alert to self, place, knows why he is here.  He does not know the year.  He tells me that prior to being here he would walk with a walker and sometimes in the wheelchair, and was able to socialize and had friends in his nursing home.    Normocytic Anemia: Hgb/hct went from 12.2/36.0 -> 11.0/34.4. Check Anemia Panel. CTM for S/Sx of Bleeding; No overt bleeding noted while hospitalized here but there is report of his coffee-ground emesis this morning. Repeat CBC in the AM and monitor H&H every 6    Discharge Planning: Ongoing  Family Contact: Attempted to contact Austin Weaver, guardian  by phone  IDT: Updated  Goals of Care: DNR  Madelene Schanz BSN, RN, [facility] hospital liaison [phone]

## 2024-01-27 NOTE — Subjective & Objective (Addendum)
 Pt seen and examined. Tolerating zyvox . Ready for DC back to SNF

## 2024-01-27 NOTE — Hospital Course (Addendum)
 HPI: Austin Weaver is a 78 y.o. male with medical history significant for but not limited to history of TIA and CVA, recurrent falls, metastatic carcinoma of unknown primary site but has a T8 vertebral (unknown if primary site or metastasis) body mass with epidural spread causing severe spinal canal stenosis and compression of the spinal cord with resultant flaccid paraplegia and urinary retention, hypertension, hyperlipidemia who is currently on hospice who presents from his nursing facility for a single episode of coffee-ground emesis in the morning as well as mucopurulent discharge from his penis and cloudy urine.  Patient's facility tried to change his catheter but had difficulty if I was able to do so.  Given his coffee-ground emesis and purulent urine they sent him for further evaluation to the ED.  Patient is a poor historian himself and has a history of dementia and feels okay at this time.  The case was discussed with his legal guardian who wants further evaluation and workup and is okay with antibiotics, fluids as well as further investigation including EGD.  Patient was found to have purulent urine and was placed on the PPI drip and [facility] was asked to admit this patient for urinary tract infection and upon evaluation patient was found to be in severe sepsis given his lactic acid level being elevated so sepsis protocol initiated on the patient.   Significant Events: Admitted 01/26/2024 for UTI due to indwelling foley catheter   Significant Labs: VBG ph 7.65, PCO2 15 Lactic acid 3.6 WBC 17.9, HgB 11, Plt 340 Na 137, K 3.7, BUN 27, Scr 1.42, glu 106 Iron 19, TIB 165, %sat 12 Blood cx 01-26-2024 staph epi, staph lugdunensis, enterococcus faecalis  Significant Imaging Studies: CXR Mild right basilar opacity as noted above  Renal U/S Limited study. The bladder and right kidney are not imaged at this time. Left kidney has some cysts as seen previously but is also incompletely evaluated. Repeat  attempt can be performed when the patient is more clinically able. Echo negative for vegetations. LVEF 65%  Antibiotic Therapy: Anti-infectives (From admission, onward)    Start     Dose/Rate Route Frequency Ordered Stop   01/29/24 1000  vancomycin  (VANCOCIN ) IVPB 1000 mg/200 mL premix        1,000 mg 200 mL/hr over 60 Minutes Intravenous Every 48 hours 01/27/24 0741     01/27/24 1200  cefTRIAXone  (ROCEPHIN ) 1 g in sodium chloride  0.9 % 100 mL IVPB  Status:  Discontinued        1 g 200 mL/hr over 30 Minutes Intravenous Every 24 hours 01/26/24 1547 01/27/24 0741   01/27/24 0830  vancomycin  (VANCOREADY) IVPB 1500 mg/300 mL        1,500 mg 150 mL/hr over 120 Minutes Intravenous  Once 01/27/24 0741     01/26/24 1600  azithromycin  (ZITHROMAX ) 500 mg in sodium chloride  0.9 % 250 mL IVPB  Status:  Discontinued        500 mg 250 mL/hr over 60 Minutes Intravenous Every 24 hours 01/26/24 1547 01/27/24 0741   01/26/24 1230  cefTRIAXone  (ROCEPHIN ) 1 g in sodium chloride  0.9 % 100 mL IVPB        1 g 200 mL/hr over 30 Minutes Intravenous  Once 01/26/24 1217 01/26/24 1450       Procedures:   Consultants: ID

## 2024-01-27 NOTE — Assessment & Plan Note (Addendum)
 01-27-2024 foley changed out in ER. On IV ABX for septicemia with enterococcus and 2 different staph species. ID has ordered surface echo.  01-28-2024 continue with IV daptomycin  per ID consult. Awaiting echo.  01-29-2024 ID changed abx over to po Zyvox . Awaiting echo results today.  Repeat blood cx 01-28-2024 growing GPC from anaerobic bottle only.  01-30-2024 DC to SNF to complete 10 total days of abx.  Completed 1 day of IV daptomycin . Has had 2 days of in-house Zyvox . Will DC to SNF with 7 days of Zyvox . Hold remeron while on Zyvox . Can restart Remeron on Feb 13, 2024

## 2024-01-27 NOTE — Plan of Care (Signed)

## 2024-01-27 NOTE — Progress Notes (Addendum)
 Pharmacy Antibiotic Note  Kacen Mellinger is a 78 y.o. male admitted on 01/26/2024 with urosepsis and bacteremia . Pharmacy has been consulted for daptomycin  dosing.  Pt's Scr on admission was 1.5>1.42. Scr has now improved to 0.86, BUN improved from 31 to 17, has had some UOP. Pt's Scr baseline ~1.0-1.1 per chart history.   However, pt has paraplegia so Scr may not be best estimate of CrCl, making vanc kinetics difficult. Will switch to daptomycin .   Plan: - Switch vanc > daptomycin  700 mg (10 mg/kg) IV q24h  - Will continue to monitor renal function, cultures, LOT   Height: 5\' 6"  (167.6 cm) Weight: 68 kg (150 lb) IBW/kg (Calculated) : 63.8  Temp (24hrs), Avg:99 F (37.2 C), Min:98.5 F (36.9 C), Max:99.4 F (37.4 C)  Recent Labs  Lab 01/26/24 1316 01/26/24 1337 01/26/24 1514 01/26/24 1958 01/27/24 0619  WBC  --  17.9*  --   --  12.6*  CREATININE 1.50* 1.42*  --   --  0.86  LATICACIDVEN 3.6*  --  1.1 1.3  --     Estimated Creatinine Clearance: 65.9 mL/min (by C-G formula based on SCr of 0.86 mg/dL).    No Known Allergies  Antimicrobials this admission: Ceftriaxone  1g x1 4/22 Azithromycin  500 mg x1 4/22 Vancomycin  1500 mg load x1 4/23  Microbiology results: 4/22 BCID: E. faecalis, S. epi, S. lugdunensis, mecA/C detected 4/22 BCx: GPC 2/4 4/22 UCx: pending   Thank you for allowing pharmacy to be a part of this patient's care.  Jahmia Berrett 01/27/2024 12:51 PM

## 2024-01-28 DIAGNOSIS — T83511A Infection and inflammatory reaction due to indwelling urethral catheter, initial encounter: Secondary | ICD-10-CM | POA: Diagnosis not present

## 2024-01-28 DIAGNOSIS — R7881 Bacteremia: Secondary | ICD-10-CM | POA: Diagnosis not present

## 2024-01-28 DIAGNOSIS — N390 Urinary tract infection, site not specified: Secondary | ICD-10-CM | POA: Diagnosis not present

## 2024-01-28 DIAGNOSIS — G309 Alzheimer's disease, unspecified: Secondary | ICD-10-CM | POA: Diagnosis not present

## 2024-01-28 DIAGNOSIS — L8990 Pressure ulcer of unspecified site, unspecified stage: Secondary | ICD-10-CM | POA: Insufficient documentation

## 2024-01-28 LAB — HEMOGLOBIN AND HEMATOCRIT, BLOOD
HCT: 29.8 % — ABNORMAL LOW (ref 39.0–52.0)
HCT: 30.9 % — ABNORMAL LOW (ref 39.0–52.0)
Hemoglobin: 9.5 g/dL — ABNORMAL LOW (ref 13.0–17.0)
Hemoglobin: 9.9 g/dL — ABNORMAL LOW (ref 13.0–17.0)

## 2024-01-28 LAB — CK: Total CK: 96 U/L (ref 49–397)

## 2024-01-28 MED ORDER — CHLORHEXIDINE GLUCONATE CLOTH 2 % EX PADS
6.0000 | MEDICATED_PAD | Freq: Every day | CUTANEOUS | Status: DC
  Administered 2024-01-29 – 2024-01-30 (×2): 6 via TOPICAL

## 2024-01-28 MED ORDER — AMLODIPINE BESYLATE 5 MG PO TABS
5.0000 mg | ORAL_TABLET | Freq: Every day | ORAL | Status: DC
  Administered 2024-01-28 – 2024-01-30 (×3): 5 mg via ORAL
  Filled 2024-01-28 (×3): qty 1

## 2024-01-28 NOTE — Progress Notes (Signed)
   Original echo was ordered to be performed at [facility] despite pt being admitted to [facility]. I have re-written order for echo to be performed at [facility].  Unk Garb, DO [facility]

## 2024-01-28 NOTE — Progress Notes (Addendum)
 PROGRESS NOTE    Austin Weaver  ZOX:[id number] DOB: 1945/12/17 DOA: 01/26/2024 PCP: [place] [facility]  Subjective: Pt seen and examined. No further vomiting. HgB stable. Surface echo pending  Pt is awake and alert. No complaints.   Hospital Course: HPI: Austin Weaver is a 78 y.o. male with medical history significant for but not limited to history of TIA and CVA, recurrent falls, metastatic carcinoma of unknown primary site but has a T8 vertebral (unknown if primary site or metastasis) body mass with epidural spread causing severe spinal canal stenosis and compression of the spinal cord with resultant flaccid paraplegia and urinary retention, hypertension, hyperlipidemia who is currently on hospice who presents from his nursing facility for a single episode of coffee-ground emesis in the morning as well as mucopurulent discharge from his penis and cloudy urine.  Patient's facility tried to change his catheter but had difficulty if I was able to do so.  Given his coffee-ground emesis and purulent urine they sent him for further evaluation to the ED.  Patient is a poor historian himself and has a history of dementia and feels okay at this time.  The case was discussed with his legal guardian who wants further evaluation and workup and is okay with antibiotics, fluids as well as further investigation including EGD.  Patient was found to have purulent urine and was placed on the PPI drip and [facility] was asked to admit this patient for urinary tract infection and upon evaluation patient was found to be in severe sepsis given his lactic acid level being elevated so sepsis protocol initiated on the patient.   Significant Events: Admitted 01/26/2024 for UTI due to indwelling foley catheter   Significant Labs: VBG ph 7.65, PCO2 15 Lactic acid 3.6 WBC 17.9, HgB 11, Plt 340 Na 137, K 3.7, BUN 27, Scr 1.42, glu 106 Iron 19, TIB 165, %sat 12 Blood cx 01-26-2024 staph epi, staph lugdunensis, enterococcus  faecalis  Significant Imaging Studies: CXR Mild right basilar opacity as noted above  Renal U/S Limited study. The bladder and right kidney are not imaged at this time. Left kidney has some cysts as seen previously but is also incompletely evaluated. Repeat attempt can be performed when the patient is more clinically able.  Antibiotic Therapy: Anti-infectives (From admission, onward)    Start     Dose/Rate Route Frequency Ordered Stop   01/29/24 1000  vancomycin  (VANCOCIN ) IVPB 1000 mg/200 mL premix        1,000 mg 200 mL/hr over 60 Minutes Intravenous Every 48 hours 01/27/24 0741     01/27/24 1200  cefTRIAXone  (ROCEPHIN ) 1 g in sodium chloride  0.9 % 100 mL IVPB  Status:  Discontinued        1 g 200 mL/hr over 30 Minutes Intravenous Every 24 hours 01/26/24 1547 01/27/24 0741   01/27/24 0830  vancomycin  (VANCOREADY) IVPB 1500 mg/300 mL        1,500 mg 150 mL/hr over 120 Minutes Intravenous  Once 01/27/24 0741     01/26/24 1600  azithromycin  (ZITHROMAX ) 500 mg in sodium chloride  0.9 % 250 mL IVPB  Status:  Discontinued        500 mg 250 mL/hr over 60 Minutes Intravenous Every 24 hours 01/26/24 1547 01/27/24 0741   01/26/24 1230  cefTRIAXone  (ROCEPHIN ) 1 g in sodium chloride  0.9 % 100 mL IVPB        1 g 200 mL/hr over 30 Minutes Intravenous  Once 01/26/24 1217 01/26/24 1450  Procedures:   Consultants: ID    Assessment and Plan: * UTI (urinary tract infection) due to urinary indwelling Foley catheter (HCC) 01-27-2024 foley changed out in ER. On IV ABX for septicemia with enterococcus and 2 different staph species.  01-28-2024 continue with IV daptomycin  per ID consult  Bacteremia due to Enterococcus 01-27-2024 foley changed out in ER. On IV ABX for septicemia with enterococcus and 2 different staph species.  01-28-2024 continue with IV daptomycin  per ID consult.  Bacteremia due to Staphylococcus 01-27-2024 foley changed out in ER. On IV ABX for septicemia with  enterococcus and 2 different staph species. ID has ordered surface echo.  01-28-2024 continue with IV daptomycin  per ID consult. Awaiting echo.  Prostate cancer metastatic to bone (HCC) 01-27-2024 chronic.  01-28-2024 stable.  Hospice care patient 01-27-2024 pt is a hospice client.  01-28-2024 stable.  Essential hypertension 01-27-2024 stable. Hold hydrochlorothiazide  for now. BP stable without HTN meds.  01-28-2024 stable. Restart low dose norvasc .  Alzheimer's dementia with behavioral disturbance (HCC) 01-27-2024 chronic.  01-28-2024 stable.  Pressure injury    WOC Nurse Consult Note: Reason for Consult: Requested to assess a PI stage 2 comes from the facility. Pt ts restrict to bed, not able to move by himself. Wound type: Pressure injury stage 3. Pressure Injury POA: Yes Measurement: 12 x 6 cm x 0.1 cm. sacrum Wound bed: 100% red. Drainage (amount, consistency, odor) Moderate amount, no odor, serous. The dressing was saturated when I visited the pt at 1030. Periwound: intact. Dressing procedure/placement/frequency: Apply silver alginate on the wound bed, cover with sacral foam dressing, change every 3 days or PRN soiling or if the dressing is saturated.    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  DVT prophylaxis: heparin  injection 5,000 Units Start: 01/26/24 2200 SCDs Start: 01/26/24 1859    Code Status: Limited: Do not attempt resuscitation (DNR) -DNR-LIMITED -Do Not Intubate/DNI  Family Communication: no family at bedside Disposition Plan: return to SNF Reason for continuing need for hospitalization: remains on IV ABX. Awaiting echo. Awaiting further ID recommendations.  Objective: Vitals:   01/27/24 2357 01/28/24 0413 01/28/24 0801 01/28/24 1229  BP: 130/68 (!) 144/77 130/70 131/69  Pulse: 76 74 75 76  Resp: 16 18 18 18   Temp: 98.6 F (37  C) 98.7 F (37.1 C) 98.3 F (36.8 C) 98.5 F (36.9 C)  TempSrc: Oral Oral Oral Oral  SpO2: 97% 98% 100% 100%  Weight:      Height:        Intake/Output Summary (Last 24 hours) at 01/28/2024 1325 Last data filed at 01/28/2024 0918 Gross per 24 hour  Intake 820.05 ml  Output 2250 ml  Net -1429.95 ml   Filed Weights   01/26/24 1927  Weight: 68 kg    Examination:  Physical Exam Vitals and nursing note reviewed.  Constitutional:      General: He is not in acute distress.    Appearance: He is not toxic-appearing or diaphoretic.  HENT:     Head: Normocephalic and atraumatic.     Nose: Nose normal.  Cardiovascular:     Rate and Rhythm: Normal rate and regular rhythm.  Pulmonary:     Effort: Pulmonary effort is normal. No respiratory distress.     Breath sounds: Normal breath sounds. No wheezing.  Abdominal:     General: Bowel sounds are normal. There is no distension.  Palpations: Abdomen is soft.  Genitourinary:    Comments: +foley Skin:    General: Skin is warm and dry.     Capillary Refill: Capillary refill takes less than 2 seconds.  Neurological:     Mental Status: He is alert.     Comments: Awake and alert. Not oriented to time. Knows that he is not at [facility].    Data Reviewed: I have personally reviewed following labs and imaging studies  CBC: Recent Labs  Lab 01/26/24 1337 01/26/24 2347 01/27/24 0619 01/27/24 1314 01/27/24 1753 01/27/24 2327 01/28/24 0647  WBC 17.9*  --  12.6*  --   --   --   --   NEUTROABS 15.3*  --  9.2*  --   --   --   --   HGB 11.0*   < > 9.4* 9.4* 9.9* 9.5* 9.9*  HCT 34.4*   < > 29.2* 29.1* 31.0* 29.8* 30.9*  MCV 87.3  --  86.9  --   --   --   --   PLT 340  --  310  --   --   --   --    < > = values in this interval not displayed.   Basic Metabolic Panel: Recent Labs  Lab 01/26/24 1316 01/26/24 1337 01/27/24 0619  NA 138  138 137 137  K 4.1  4.2 3.7 3.1*  CL 110 104 105  CO2  --  22 20*  GLUCOSE 97 106* 90  BUN  31* 27* 17  CREATININE 1.50* 1.42* 0.86  CALCIUM   --  8.7* 8.2*   GFR: Estimated Creatinine Clearance: 65.9 mL/min (by C-G formula based on SCr of 0.86 mg/dL). Liver Function Tests: Recent Labs  Lab 01/26/24 1337 01/27/24 0619  AST 15 13*  ALT 10 8  ALKPHOS 58 45  BILITOT 0.8 0.8  PROT 7.6 6.4*  ALBUMIN 2.5* 2.2*   Coagulation Profile: Recent Labs  Lab 01/26/24 1958  INR 1.3*   Cardiac Enzymes: Recent Labs  Lab 01/28/24 0647  CKTOTAL 96   CBG: Recent Labs  Lab 01/26/24 1312  GLUCAP 110*   Thyroid  Function Tests: Recent Labs    01/27/24 0619  TSH 0.516   Anemia Panel: Recent Labs    01/26/24 1417 01/26/24 1600  VITAMINB12  --  264  FOLATE  --  10.2  FERRITIN 1,053*  --   TIBC 165*  --   IRON 19*  --   RETICCTPCT  --  1.5   Sepsis Labs: Recent Labs  Lab 01/26/24 1316 01/26/24 1417 01/26/24 1514 01/26/24 1958  PROCALCITON  --  0.71  --   --   LATICACIDVEN 3.6*  --  1.1 1.3    Recent Results (from the past 240 hours)  Resp panel by RT-PCR (RSV, Flu A&B, Covid) Anterior Nasal Swab     Status: None   Collection Time: 01/26/24 12:18 PM   Specimen: Anterior Nasal Swab  Result Value Ref Range Status   SARS Coronavirus 2 by RT PCR NEGATIVE NEGATIVE Final   Influenza A by PCR NEGATIVE NEGATIVE Final   Influenza B by PCR NEGATIVE NEGATIVE Final    Comment: (NOTE) The Xpert Xpress SARS-CoV-2/FLU/RSV plus assay is intended as an aid in the diagnosis of influenza from Nasopharyngeal swab specimens and should not be used as a sole basis for treatment. Nasal washings and aspirates are unacceptable for Xpert Xpress SARS-CoV-2/FLU/RSV testing.  Fact Sheet for Patients: BloggerCourse.com  Fact Sheet for Healthcare Providers: SeriousBroker.it  This test is not yet approved or cleared by the [place]  FDA and has been authorized for detection and/or diagnosis of SARS-CoV-2 by FDA under an Emergency  Use Authorization (EUA). This EUA will remain in effect (meaning this test can be used) for the duration of the COVID-19 declaration under Section 564(b)(1) of the Act, 21 U.S.C. section 360bbb-3(b)(1), unless the authorization is terminated or revoked.     Resp Syncytial Virus by PCR NEGATIVE NEGATIVE Final    Comment: (NOTE) Fact Sheet for Patients: BloggerCourse.com  Fact Sheet for Healthcare Providers: SeriousBroker.it  This test is not yet approved or cleared by the [place]  FDA and has been authorized for detection and/or diagnosis of SARS-CoV-2 by FDA under an Emergency Use Authorization (EUA). This EUA will remain in effect (meaning this test can be used) for the duration of the COVID-19 declaration under Section 564(b)(1) of the Act, 21 U.S.C. section 360bbb-3(b)(1), unless the authorization is terminated or revoked.  Performed at [facility], [address], [place], [place] [address]   Culture, blood (routine x 2)     Status: None (Preliminary result)   Collection Time: 01/26/24  1:37 PM   Specimen: BLOOD RIGHT HAND  Result Value Ref Range Status   Specimen Description BLOOD RIGHT HAND  Final   Special Requests   Final    BOTTLES DRAWN AEROBIC AND ANAEROBIC Blood Culture results may not be optimal due to an inadequate volume of blood received in culture bottles   Culture   Final    NO GROWTH 2 DAYS Performed at [facility], [address], [place], [place] [address]    Report Status PENDING  Incomplete  Culture, blood (routine x 2)     Status: None (Preliminary result)   Collection Time: 01/26/24  1:37 PM   Specimen: BLOOD  Result Value Ref Range Status   Specimen Description BLOOD THUMB  Final   Special Requests   Final    BOTTLES DRAWN AEROBIC AND ANAEROBIC Blood Culture results may not be optimal due to an inadequate volume of blood received in culture bottles   Culture  Setup Time   Final     GRAM POSITIVE COCCI IN BOTH AEROBIC AND ANAEROBIC BOTTLES CRITICAL RESULT CALLED TO, READ BACK BY AND VERIFIED WITH: PHARMD G ABBOTT 01/27/2024 @ 0725 BY AB Performed at [facility], [address], [place], [place] [address]    Culture GRAM POSITIVE COCCI  Final   Report Status PENDING  Incomplete  Blood Culture ID Panel (Reflexed)     Status: Abnormal   Collection Time: 01/26/24  1:37 PM  Result Value Ref Range Status   Enterococcus faecalis DETECTED (A) NOT DETECTED Final    Comment: CRITICAL RESULT CALLED TO, READ BACK BY AND VERIFIED WITH: PHARMD G ABBOTT 01/27/2024 @ 0725 BY AB    Enterococcus Faecium NOT DETECTED NOT DETECTED Final   Listeria monocytogenes NOT DETECTED NOT DETECTED Final   Staphylococcus species DETECTED (A) NOT DETECTED Final    Comment: CRITICAL RESULT CALLED TO, READ BACK BY AND VERIFIED WITH: PHARMD G ABBOTT 01/27/2024 @ 0725 BY AB    Staphylococcus aureus (BCID) NOT DETECTED NOT DETECTED Final   Staphylococcus epidermidis DETECTED (A) NOT DETECTED Final    Comment: Methicillin (oxacillin) resistant coagulase negative staphylococcus. Possible blood culture contaminant (unless isolated from more than one blood culture draw or clinical case suggests pathogenicity). No antibiotic treatment is indicated for blood  culture contaminants. CRITICAL RESULT CALLED  TO, READ BACK BY AND VERIFIED WITH: PHARMD G ABBOTT 01/27/2024 @ 0725 BY AB    Staphylococcus lugdunensis DETECTED (A) NOT DETECTED Final    Comment: Methicillin (oxacillin) resistant coagulase negative staphylococcus. Possible blood culture contaminant (unless isolated from more than one blood culture draw or clinical case suggests pathogenicity). No antibiotic treatment is indicated for blood  culture contaminants. CRITICAL RESULT CALLED TO, READ BACK BY AND VERIFIED WITH: PHARMD G ABBOTT 01/27/2024 @ 0725 BY AB    Streptococcus species NOT DETECTED NOT DETECTED Final   Streptococcus agalactiae  NOT DETECTED NOT DETECTED Final   Streptococcus pneumoniae NOT DETECTED NOT DETECTED Final   Streptococcus pyogenes NOT DETECTED NOT DETECTED Final   A.calcoaceticus-baumannii NOT DETECTED NOT DETECTED Final   Bacteroides fragilis NOT DETECTED NOT DETECTED Final   Enterobacterales NOT DETECTED NOT DETECTED Final   Enterobacter cloacae complex NOT DETECTED NOT DETECTED Final   Escherichia coli NOT DETECTED NOT DETECTED Final   Klebsiella aerogenes NOT DETECTED NOT DETECTED Final   Klebsiella oxytoca NOT DETECTED NOT DETECTED Final   Klebsiella pneumoniae NOT DETECTED NOT DETECTED Final   Proteus species NOT DETECTED NOT DETECTED Final   Salmonella species NOT DETECTED NOT DETECTED Final   Serratia marcescens NOT DETECTED NOT DETECTED Final   Haemophilus influenzae NOT DETECTED NOT DETECTED Final   Neisseria meningitidis NOT DETECTED NOT DETECTED Final   Pseudomonas aeruginosa NOT DETECTED NOT DETECTED Final   Stenotrophomonas maltophilia NOT DETECTED NOT DETECTED Final   Candida albicans NOT DETECTED NOT DETECTED Final   Candida auris NOT DETECTED NOT DETECTED Final   Candida glabrata NOT DETECTED NOT DETECTED Final   Candida krusei NOT DETECTED NOT DETECTED Final   Candida parapsilosis NOT DETECTED NOT DETECTED Final   Candida tropicalis NOT DETECTED NOT DETECTED Final   Cryptococcus neoformans/gattii NOT DETECTED NOT DETECTED Final   Methicillin resistance mecA/C DETECTED (A) NOT DETECTED Final    Comment: CRITICAL RESULT CALLED TO, READ BACK BY AND VERIFIED WITH: PHARMD G ABBOTT 01/27/2024 @ 0725 BY AB    Vancomycin  resistance NOT DETECTED NOT DETECTED Final    Comment: Performed at [facility], [address], [place], [place] [address]  Remove and replace urinary cath (placed > 5 days) then obtain urine culture from new indwelling urinary catheter.     Status: Abnormal   Collection Time: 01/26/24  3:14 PM   Specimen: Urine, Catheterized  Result Value Ref Range Status    Specimen Description URINE, CATHETERIZED  Final   Special Requests   Final    NONE Performed at [facility], [address] [place], [place] [address]    Culture MULTIPLE SPECIES PRESENT, SUGGEST RECOLLECTION (A)  Final   Report Status 01/27/2024 FINAL  Final     Radiology Studies: X-ray chest PA and lateral Result Date: 01/27/2024 CLINICAL DATA:  [id number] CAP (community acquired pneumonia) [id number] EXAM: CHEST - 2 VIEW COMPARISON:  January 26, 2024 FINDINGS: Small to moderate volume right pleural effusion with right basilar atelectasis. No pneumothorax. Mild cardiomegaly. Tortuous aorta with aortic atherosclerosis. No acute fracture or destructive lesions. Multilevel thoracic osteophytosis. IMPRESSION: Small to moderate volume right pleural effusion with right basilar atelectasis. Electronically Signed   By: Rance Burrows M.D.   On: 01/27/2024 09:08   US  RENAL Result Date: 01/26/2024 CLINICAL DATA:  Acute kidney injury. EXAM: RENAL / URINARY TRACT ULTRASOUND COMPLETE COMPARISON:  Ultrasound 09/22/2018 FINDINGS: Study is limited as the patient had difficulty tolerating the procedure due to discomfort  and body contraction. Poor sonographic window. The right kidney is not imaged. Left kidney grossly measures 9.3 x 6.3 x 6.1 cm. Volume 184 cc. Cystic areas are identified along the kidney measuring up to 6.6 cm and is smaller measuring 2.2 cm. The larger focus appears relatively simple in the smaller has some thin septations. These were seen previously. Bladder is not imaged. IMPRESSION: Limited study. The bladder and right kidney are not imaged at this time. Left kidney has some cysts as seen previously but is also incompletely evaluated. Repeat attempt can be performed when the patient is more clinically able. Electronically Signed   By: Adrianna Horde M.D.   On: 01/26/2024 17:00   DG Chest Port 1 View Result Date: 01/26/2024 CLINICAL DATA:  Weakness. EXAM: PORTABLE CHEST 1 VIEW COMPARISON:  May 18, 2023. FINDINGS: Stable cardiomegaly. Left lung is clear. Mild right basilar atelectasis or infiltrate is noted with associated effusion. Bony thorax is unremarkable. IMPRESSION: Mild right basilar opacity as noted above. Electronically Signed   By: Rosalene Colon M.D.   On: 01/26/2024 15:24    Scheduled Meds:  amLODipine   5 mg Oral Daily   atorvastatin   40 mg Oral QHS   fluticasone   2 spray Each Nare Daily   heparin   5,000 Units Subcutaneous Q8H   pantoprazole  (PROTONIX ) IV  40 mg Intravenous Q12H   Continuous Infusions:  DAPTOmycin  700 mg (01/28/24 1231)     LOS: 2 days   Time spent: 45 minutes  Unk Garb, DO  [facility]  01/28/2024, 1:25 PM

## 2024-01-28 NOTE — Progress Notes (Signed)
 [name] 2W39-[facility] Patient Visit   [name] is a current hospice patient, with hospice diagnosis of 2ndary malignant neoplasm of unspecified site. He was admitted 01/26/2024 with diagnosis of UTI. [facility] was notified of transfer to the hospital by the ED provider. Per Dr. Tessie Fila, hospice physician, this is a related hospital admission.    Visited with patient in hospital. He is resting quietly and is more alert than yesterday. Denies complaint. Message sent to guardian, Dontay.    Patient is GIP appropriate for evaluation of and treatment of infection including IV antibiotics.   Vital Signs: 98.3/75/18 130/70   O2 100% on RA   I&O: 820/1450   Abnormal labs: HGB 9.9   Diagnostics: none new   IV/PRN Meds: NS 100 (discontinued today), protonix  40mg  IV every 12 hours, Daptomycin  700mg  IV x 1.    Assessment and Plan from note 4.23.25      UTI (urinary tract infection) due to urinary indwelling Foley catheter (HCC) 01-27-2024 foley changed out in ER. On IV ABX for septicemia with enterococcus and 2 different staph species.   Bacteremia due to Enterococcus 01-27-2024 foley changed out in ER. On IV ABX for septicemia with enterococcus and 2 different staph species.   Bacteremia due to Gram-positive bacteria 01-27-2024 foley changed out in ER. On IV ABX for septicemia with enterococcus and 2 different staph species. ID has ordered surface echo.     Alzheimer's dementia with behavioral disturbance (HCC) 01-27-2024 chronic.   Discharge Planning: Ongoing   Family Contact: Message sent to [facility], guardian   IDT: Updated   Goals of Care: DNR   Madelene Schanz BSN, RN, [facility] hospital liaison [phone]

## 2024-01-28 NOTE — Assessment & Plan Note (Addendum)
 01-28-2024 stable.  01-30-2024 stable.

## 2024-01-28 NOTE — Consult Note (Signed)
 WOC Nurse Consult Note: Reason for Consult: Requested to assess a PI stage 2 comes from the facility. Pt ts restrict to bed, not able to move by himself. Wound type: Pressure injury stage 3. Pressure Injury POA: Yes Measurement: 12 x 6 cm x 0.1 cm Wound bed: 100% red. Drainage (amount, consistency, odor) Moderate amount, no odor, serous. The dressing was saturated when I visited the pt at 1030. Periwound: intact. Dressing procedure/placement/frequency: Apply silver alginate on the wound bed, cover with sacral foam dressing, change every 3 days or PRN soiling or if the dressing is saturated.  WOC team will not plan to follow further.  Please reconsult if further assistance is needed. Thank-you,  Rachel Budds BSN, RN, [employer], WOC  (Pager: [phone])

## 2024-01-28 NOTE — Plan of Care (Signed)
  Problem: Education: Goal: Knowledge of General Education information will improve Description: Including pain rating scale, medication(s)/side effects and non-pharmacologic comfort measures Outcome: Progressing   Problem: Health Behavior/Discharge Planning: Goal: Ability to manage health-related needs will improve Outcome: Progressing   Problem: Clinical Measurements: Goal: Ability to maintain clinical measurements within normal limits will improve Outcome: Progressing Goal: Will remain free from infection Outcome: Progressing Goal: Diagnostic test results will improve Outcome: Progressing Goal: Respiratory complications will improve Outcome: Progressing Goal: Cardiovascular complication will be avoided Outcome: Progressing   Problem: Activity: Goal: Risk for activity intolerance will decrease Outcome: Progressing   Problem: Nutrition: Goal: Adequate nutrition will be maintained Outcome: Progressing   Problem: Coping: Goal: Level of anxiety will decrease Outcome: Progressing   Problem: Elimination: Goal: Will not experience complications related to bowel motility Outcome: Progressing Goal: Will not experience complications related to urinary retention Outcome: Progressing   Problem: Safety: Goal: Ability to remain free from injury will improve Outcome: Progressing   Problem: Skin Integrity: Goal: Risk for impaired skin integrity will decrease Outcome: Progressing   Problem: Pain Managment: Goal: General experience of comfort will improve and/or be controlled Outcome: Progressing   Problem: Respiratory: Goal: Ability to maintain adequate ventilation will improve Outcome: Progressing

## 2024-01-28 NOTE — Plan of Care (Signed)

## 2024-01-28 NOTE — Assessment & Plan Note (Signed)
    WOC Nurse Consult Note: Reason for Consult: Requested to assess a PI stage 2 comes from the facility. Pt ts restrict to bed, not able to move by himself. Wound type: Pressure injury stage 3. Pressure Injury POA: Yes Measurement: 12 x 6 cm x 0.1 cm. sacrum Wound bed: 100% red. Drainage (amount, consistency, odor) Moderate amount, no odor, serous. The dressing was saturated when I visited the pt at 1030. Periwound: intact. Dressing procedure/placement/frequency: Apply silver alginate on the wound bed, cover with sacral foam dressing, change every 3 days or PRN soiling or if the dressing is saturated.

## 2024-01-29 ENCOUNTER — Other Ambulatory Visit (HOSPITAL_COMMUNITY): Payer: Self-pay

## 2024-01-29 ENCOUNTER — Inpatient Hospital Stay (HOSPITAL_COMMUNITY)

## 2024-01-29 DIAGNOSIS — B958 Unspecified staphylococcus as the cause of diseases classified elsewhere: Secondary | ICD-10-CM

## 2024-01-29 DIAGNOSIS — R7881 Bacteremia: Secondary | ICD-10-CM | POA: Diagnosis not present

## 2024-01-29 DIAGNOSIS — N390 Urinary tract infection, site not specified: Secondary | ICD-10-CM | POA: Diagnosis not present

## 2024-01-29 DIAGNOSIS — T83511A Infection and inflammatory reaction due to indwelling urethral catheter, initial encounter: Secondary | ICD-10-CM | POA: Diagnosis not present

## 2024-01-29 DIAGNOSIS — G309 Alzheimer's disease, unspecified: Secondary | ICD-10-CM | POA: Diagnosis not present

## 2024-01-29 LAB — ECHOCARDIOGRAM COMPLETE
AR max vel: 2.75 cm2
AV Area VTI: 3.14 cm2
AV Area mean vel: 2.77 cm2
AV Mean grad: 1 mmHg
AV Peak grad: 2.7 mmHg
Ao pk vel: 0.82 m/s
Area-P 1/2: 2.87 cm2
Calc EF: 65.7 %
Height: 66 in
MV VTI: 3.04 cm2
S' Lateral: 2.7 cm
Single Plane A2C EF: 53.8 %
Single Plane A4C EF: 73.6 %
Weight: 2400 [oz_av]

## 2024-01-29 LAB — CBC WITH DIFFERENTIAL/PLATELET
Abs Immature Granulocytes: 0.1 10*3/uL — ABNORMAL HIGH (ref 0.00–0.07)
Basophils Absolute: 0 10*3/uL (ref 0.0–0.1)
Basophils Relative: 0 %
Eosinophils Absolute: 0.2 10*3/uL (ref 0.0–0.5)
Eosinophils Relative: 2 %
HCT: 33 % — ABNORMAL LOW (ref 39.0–52.0)
Hemoglobin: 10.7 g/dL — ABNORMAL LOW (ref 13.0–17.0)
Immature Granulocytes: 1 %
Lymphocytes Relative: 15 %
Lymphs Abs: 1.6 10*3/uL (ref 0.7–4.0)
MCH: 28.2 pg (ref 26.0–34.0)
MCHC: 32.4 g/dL (ref 30.0–36.0)
MCV: 87.1 fL (ref 80.0–100.0)
Monocytes Absolute: 1 10*3/uL (ref 0.1–1.0)
Monocytes Relative: 10 %
Neutro Abs: 7.3 10*3/uL (ref 1.7–7.7)
Neutrophils Relative %: 72 %
Platelets: 327 10*3/uL (ref 150–400)
RBC: 3.79 MIL/uL — ABNORMAL LOW (ref 4.22–5.81)
RDW: 16.3 % — ABNORMAL HIGH (ref 11.5–15.5)
WBC: 10.3 10*3/uL (ref 4.0–10.5)
nRBC: 0 % (ref 0.0–0.2)

## 2024-01-29 MED ORDER — LINEZOLID 600 MG PO TABS
600.0000 mg | ORAL_TABLET | Freq: Two times a day (BID) | ORAL | Status: DC
  Administered 2024-01-29 – 2024-01-30 (×3): 600 mg via ORAL
  Filled 2024-01-29 (×3): qty 1

## 2024-01-29 NOTE — Progress Notes (Signed)
  Echocardiogram 2D Echocardiogram has been performed.  Obie Silos L Brallan Denio RDCS 01/29/2024, 8:38 AM

## 2024-01-29 NOTE — Progress Notes (Signed)
 ID PROGRESS NOTE   - switch to linezolid  oral - will follow on repeat gpc, suspect CoNS - can d/c dapto  Silas Sedam B. Levern Reader MD MPH [facility] [phone]

## 2024-01-29 NOTE — Plan of Care (Signed)

## 2024-01-29 NOTE — Progress Notes (Signed)
 Austin Weaver 2W39-[facility] Patient Visit   [name] is a current hospice patient, with hospice diagnosis of 2ndary malignant neoplasm of unspecified site. He was admitted 01/26/2024 with diagnosis of UTI. [facility] was notified of transfer to the hospital by the ED provider. Per Dr. Tessie Fila, hospice physician, this is a related hospital admission.    Visited with patient in hospital. He is without complaint at this time. He verbalizes that he is ready to go back to his facility soon. Plan of care updated with guardian by phone.   Patient is GIP appropriate for evaluation of repeat blood cultures and echo result.    Vital Signs: 98.3/84/18   130/76   O2 98% on RA   I&O: 100/1650   Abnormal labs: HGB 10.7, repeat BC pending   Diagnostics: echo: 1. Left ventricular ejection fraction, by estimation, is 65 to 70%. The  left ventricle has normal function. The left ventricle has no regional  wall motion abnormalities. There is mild concentric left ventricular  hypertrophy. Left ventricular diastolic  parameters are consistent with Grade I diastolic dysfunction (impaired  relaxation).   2. Right ventricular systolic function is normal. The right ventricular  size is normal.   3. The mitral valve is normal in structure. Trivial mitral valve  regurgitation. No evidence of mitral stenosis.   4. The aortic valve is tricuspid. There is mild calcification of the  aortic valve. Aortic valve regurgitation is not visualized. Aortic valve  sclerosis/calcification is present, without any evidence of aortic  stenosis.   5. The inferior vena cava is normal in size with greater than 50%  respiratory variability, suggesting right atrial pressure of 3 mmHg.    IV/PRN Meds: protonix  40mg  IV every 12 hours     Assessment and Plan from note 4.25.25     UTI (urinary tract infection) due to urinary indwelling Foley catheter (HCC) 01-27-2024 foley changed  out in ER. On IV ABX for septicemia with enterococcus and 2 different staph species.   01-28-2024 continue with IV daptomycin  per ID consult   01-29-2024 ID changed abx over to po Zyvox .   Bacteremia due to Enterococcus 01-27-2024 foley changed out in ER. On IV ABX for septicemia with enterococcus and 2 different staph species.   01-28-2024 continue with IV daptomycin  per ID consult.   01-29-2024 ID changed abx over to po Zyvox .   Bacteremia due to Staphylococcus 01-27-2024 foley changed out in ER. On IV ABX for septicemia with enterococcus and 2 different staph species. ID has ordered surface echo.   01-28-2024 continue with IV daptomycin  per ID consult. Awaiting echo.   01-29-2024 ID changed abx over to po Zyvox . Awaiting echo results today.  Repeat blood cx 01-28-2024 growing GPC from anaerobic bottle only.   Prostate cancer metastatic to bone (HCC) 01-27-2024 chronic.   01-28-2024 stable.   01-29-2024 stable.    Alzheimer's dementia with behavioral disturbance (HCC) 01-27-2024 chronic.   01-28-2024 stable.   01-29-2024 stable.   Discharge Planning: Ongoing   Family Contact: Communicated with guardian, Dontay by phone.    IDT: Updated   Goals of Care: DNR   Madelene Schanz BSN, RN, [facility] hospital liaison [phone]

## 2024-01-29 NOTE — Progress Notes (Signed)
 PROGRESS NOTE    Charle Weaver  ZOX:[id number] DOB: [birth year] DOA: 01/26/2024 PCP: [place] [facility]  Subjective: Pt seen and examined. Pt is awake and alert. No complaints. Just finished with surface echo. Repeat blood cx growing GPC from anaerobic bottle only.  ID has changed pt over to PO Zyvox .   Hospital Course: HPI: Austin Weaver is a 78 y.o. male with medical history significant for but not limited to history of TIA and CVA, recurrent falls, metastatic carcinoma of unknown primary site but has a T8 vertebral (unknown if primary site or metastasis) body mass with epidural spread causing severe spinal canal stenosis and compression of the spinal cord with resultant flaccid paraplegia and urinary retention, hypertension, hyperlipidemia who is currently on hospice who presents from his nursing facility for a single episode of coffee-ground emesis in the morning as well as mucopurulent discharge from his penis and cloudy urine.  Patient's facility tried to change his catheter but had difficulty if I was able to do so.  Given his coffee-ground emesis and purulent urine they sent him for further evaluation to the ED.  Patient is a poor historian himself and has a history of dementia and feels okay at this time.  The case was discussed with his legal guardian who wants further evaluation and workup and is okay with antibiotics, fluids as well as further investigation including EGD.  Patient was found to have purulent urine and was placed on the PPI drip and [facility] was asked to admit this patient for urinary tract infection and upon evaluation patient was found to be in severe sepsis given his lactic acid level being elevated so sepsis protocol initiated on the patient.   Significant Events: Admitted 01/26/2024 for UTI due to indwelling foley catheter   Significant Labs: VBG ph 7.65, PCO2 15 Lactic acid 3.6 WBC 17.9, HgB 11, Plt 340 Na 137, K 3.7, BUN 27, Scr 1.42, glu 106 Iron 19, TIB 165,  %sat 12 Blood cx 01-26-2024 staph epi, staph lugdunensis, enterococcus faecalis  Significant Imaging Studies: CXR Mild right basilar opacity as noted above  Renal U/S Limited study. The bladder and right kidney are not imaged at this time. Left kidney has some cysts as seen previously but is also incompletely evaluated. Repeat attempt can be performed when the patient is more clinically able.  Antibiotic Therapy: Anti-infectives (From admission, onward)    Start     Dose/Rate Route Frequency Ordered Stop   01/29/24 1000  vancomycin  (VANCOCIN ) IVPB 1000 mg/200 mL premix        1,000 mg 200 mL/hr over 60 Minutes Intravenous Every 48 hours 01/27/24 0741     01/27/24 1200  cefTRIAXone  (ROCEPHIN ) 1 g in sodium chloride  0.9 % 100 mL IVPB  Status:  Discontinued        1 g 200 mL/hr over 30 Minutes Intravenous Every 24 hours 01/26/24 1547 01/27/24 0741   01/27/24 0830  vancomycin  (VANCOREADY) IVPB 1500 mg/300 mL        1,500 mg 150 mL/hr over 120 Minutes Intravenous  Once 01/27/24 0741     01/26/24 1600  azithromycin  (ZITHROMAX ) 500 mg in sodium chloride  0.9 % 250 mL IVPB  Status:  Discontinued        500 mg 250 mL/hr over 60 Minutes Intravenous Every 24 hours 01/26/24 1547 01/27/24 0741   01/26/24 1230  cefTRIAXone  (ROCEPHIN ) 1 g in sodium chloride  0.9 % 100 mL IVPB        1 g 200 mL/hr over 30  Minutes Intravenous  Once 01/26/24 1217 01/26/24 1450       Procedures:   Consultants: ID    Assessment and Plan: * UTI (urinary tract infection) due to urinary indwelling Foley catheter (HCC) 01-27-2024 foley changed out in ER. On IV ABX for septicemia with enterococcus and 2 different staph species.  01-28-2024 continue with IV daptomycin  per ID consult  01-29-2024 ID changed abx over to po Zyvox .  Bacteremia due to Enterococcus 01-27-2024 foley changed out in ER. On IV ABX for septicemia with enterococcus and 2 different staph species.  01-28-2024 continue with IV daptomycin  per ID  consult.  01-29-2024 ID changed abx over to po Zyvox .  Bacteremia due to Staphylococcus 01-27-2024 foley changed out in ER. On IV ABX for septicemia with enterococcus and 2 different staph species. ID has ordered surface echo.  01-28-2024 continue with IV daptomycin  per ID consult. Awaiting echo.  01-29-2024 ID changed abx over to po Zyvox . Awaiting echo results today.  Repeat blood cx 01-28-2024 growing GPC from anaerobic bottle only.  Prostate cancer metastatic to bone (HCC) 01-27-2024 chronic.  01-28-2024 stable.  01-29-2024 stable.  Hospice care patient 01-27-2024 pt is a hospice client.  01-28-2024 stable.  01-29-2024 stable.  Essential hypertension 01-27-2024 stable. Hold hydrochlorothiazide  for now. BP stable without HTN meds.  01-28-2024 stable. Restart low dose norvasc .  01-29-2024 stable. BP improved with restarting norvasc .  Still holding hydrochlorothiazide .  Alzheimer's dementia with behavioral disturbance (HCC) 01-27-2024 chronic.  01-28-2024 stable.  01-29-2024 stable.  Pressure injury    WOC Nurse Consult Note: Reason for Consult: Requested to assess a PI stage 2 comes from the facility. Pt ts restrict to bed, not able to move by himself. Wound type: Pressure injury stage 3. Pressure Injury POA: Yes Measurement: 12 x 6 cm x 0.1 cm. sacrum Wound bed: 100% red. Drainage (amount, consistency, odor) Moderate amount, no odor, serous. The dressing was saturated when I visited the pt at 1030. Periwound: intact. Dressing procedure/placement/frequency: Apply silver alginate on the wound bed, cover with sacral foam dressing, change every 3 days or PRN soiling or if the dressing is saturated.    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   DVT prophylaxis: heparin  injection 5,000 Units Start: 01/26/24 2200 SCDs Start: 01/26/24 1859    Code  Status: Limited: Do not attempt resuscitation (DNR) -DNR-LIMITED -Do Not Intubate/DNI  Family Communication: no family at bedside Disposition Plan: return to SNF Reason for continuing need for hospitalization: awaiting echo results and blood cx results.  Objective: Vitals:   01/28/24 1630 01/28/24 2028 01/29/24 0508 01/29/24 0835  BP: 134/87 138/70 134/67 130/76  Pulse: 79 96 82 84  Resp: 18 18 18    Temp: 99.3 F (37.4 C) 98.4 F (36.9 C) 98.6 F (37 C) 98.3 F (36.8 C)  TempSrc: Oral Oral Oral   SpO2: 98% 100% 98% 98%  Weight:      Height:        Intake/Output Summary (Last 24 hours) at 01/29/2024 1015 Last data filed at 01/28/2024 2208 Gross per 24 hour  Intake 100 ml  Output 850 ml  Net -750 ml   Filed Weights   01/26/24 1927  Weight: 68 kg    Examination:  Physical Exam Vitals and nursing note reviewed.  Constitutional:      General: He is not in acute distress.    Appearance: He is not toxic-appearing or diaphoretic.  HENT:     Head: Normocephalic and atraumatic.     Nose: Nose normal.  Cardiovascular:     Rate and Rhythm: Normal rate and regular rhythm.  Abdominal:     General: Abdomen is flat. Bowel sounds are normal.     Palpations: Abdomen is soft.  Genitourinary:    Comments: +foley Skin:    General: Skin is warm and dry.     Capillary Refill: Capillary refill takes less than 2 seconds.  Neurological:     Mental Status: He is alert.     Comments: Not oriented to place or time. Is awake and alert.     Data Reviewed: I have personally reviewed following labs and imaging studies  CBC: Recent Labs  Lab 01/26/24 1337 01/26/24 2347 01/27/24 0619 01/27/24 1314 01/27/24 1753 01/27/24 2327 01/28/24 0647  WBC 17.9*  --  12.6*  --   --   --   --   NEUTROABS 15.3*  --  9.2*  --   --   --   --   HGB 11.0*   < > 9.4* 9.4* 9.9* 9.5* 9.9*  HCT 34.4*   < > 29.2* 29.1* 31.0* 29.8* 30.9*  MCV 87.3  --  86.9  --   --   --   --   PLT 340  --  310  --    --   --   --    < > = values in this interval not displayed.   Basic Metabolic Panel: Recent Labs  Lab 01/26/24 1316 01/26/24 1337 01/27/24 0619  NA 138  138 137 137  K 4.1  4.2 3.7 3.1*  CL 110 104 105  CO2  --  22 20*  GLUCOSE 97 106* 90  BUN 31* 27* 17  CREATININE 1.50* 1.42* 0.86  CALCIUM   --  8.7* 8.2*   GFR: Estimated Creatinine Clearance: 65.9 mL/min (by C-G formula based on SCr of 0.86 mg/dL). Liver Function Tests: Recent Labs  Lab 01/26/24 1337 01/27/24 0619  AST 15 13*  ALT 10 8  ALKPHOS 58 45  BILITOT 0.8 0.8  PROT 7.6 6.4*  ALBUMIN 2.5* 2.2*   Coagulation Profile: Recent Labs  Lab 01/26/24 1958  INR 1.3*   Cardiac Enzymes: Recent Labs  Lab 01/28/24 0647  CKTOTAL 96   CBG: Recent Labs  Lab 01/26/24 1312  GLUCAP 110*   Thyroid  Function Tests: Recent Labs    01/27/24 0619  TSH 0.516   Anemia Panel: Recent Labs    01/26/24 1417 01/26/24 1600  VITAMINB12  --  264  FOLATE  --  10.2  FERRITIN 1,053*  --   TIBC 165*  --   IRON 19*  --   RETICCTPCT  --  1.5   Sepsis Labs: Recent Labs  Lab 01/26/24 1316 01/26/24 1417 01/26/24 1514 01/26/24 1958  PROCALCITON  --  0.71  --   --   LATICACIDVEN 3.6*  --  1.1 1.3    Recent Results (from the past 240 hours)  Resp panel by RT-PCR (RSV, Flu A&B, Covid) Anterior Nasal Swab     Status: None   Collection Time: 01/26/24 12:18 PM   Specimen: Anterior Nasal Swab  Result Value Ref Range Status   SARS Coronavirus 2 by RT PCR NEGATIVE NEGATIVE Final   Influenza A by PCR NEGATIVE NEGATIVE Final   Influenza B by PCR NEGATIVE NEGATIVE Final    Comment: (NOTE) The Xpert Xpress SARS-CoV-2/FLU/RSV plus assay is intended as an aid  in the diagnosis of influenza from Nasopharyngeal swab specimens and should not be used as a sole basis for treatment. Nasal washings and aspirates are unacceptable for Xpert Xpress SARS-CoV-2/FLU/RSV testing.  Fact Sheet for  Patients: BloggerCourse.com  Fact Sheet for Healthcare Providers: SeriousBroker.it  This test is not yet approved or cleared by the [place]  FDA and has been authorized for detection and/or diagnosis of SARS-CoV-2 by FDA under an Emergency Use Authorization (EUA). This EUA will remain in effect (meaning this test can be used) for the duration of the COVID-19 declaration under Section 564(b)(1) of the Act, 21 U.S.C. section 360bbb-3(b)(1), unless the authorization is terminated or revoked.     Resp Syncytial Virus by PCR NEGATIVE NEGATIVE Final    Comment: (NOTE) Fact Sheet for Patients: BloggerCourse.com  Fact Sheet for Healthcare Providers: SeriousBroker.it  This test is not yet approved or cleared by the [place]  FDA and has been authorized for detection and/or diagnosis of SARS-CoV-2 by FDA under an Emergency Use Authorization (EUA). This EUA will remain in effect (meaning this test can be used) for the duration of the COVID-19 declaration under Section 564(b)(1) of the Act, 21 U.S.C. section 360bbb-3(b)(1), unless the authorization is terminated or revoked.  Performed at [facility], [address], [place], [place] [address]   Culture, blood (routine x 2)     Status: None (Preliminary result)   Collection Time: 01/26/24  1:37 PM   Specimen: BLOOD RIGHT HAND  Result Value Ref Range Status   Specimen Description BLOOD RIGHT HAND  Final   Special Requests   Final    BOTTLES DRAWN AEROBIC AND ANAEROBIC Blood Culture results may not be optimal due to an inadequate volume of blood received in culture bottles   Culture   Final    NO GROWTH 3 DAYS Performed at [facility], [address] [place], [place] [address]    Report Status PENDING  Incomplete  Culture, blood (routine x 2)     Status: Abnormal (Preliminary result)   Collection Time: 01/26/24   1:37 PM   Specimen: BLOOD  Result Value Ref Range Status   Specimen Description BLOOD THUMB  Final   Special Requests   Final    BOTTLES DRAWN AEROBIC AND ANAEROBIC Blood Culture results may not be optimal due to an inadequate volume of blood received in culture bottles   Culture  Setup Time   Final    GRAM POSITIVE COCCI IN BOTH AEROBIC AND ANAEROBIC BOTTLES CRITICAL RESULT CALLED TO, READ BACK BY AND VERIFIED WITH: PHARMD G ABBOTT 01/27/2024 @ 0725 BY AB Performed at [facility], [address], [place], [place] [address]    Culture (A)  Final    ENTEROCOCCUS FAECALIS SUSCEPTIBILITIES TO FOLLOW CULTURE REINCUBATED FOR BETTER GROWTH STAPHYLOCOCCUS EPIDERMIDIS STAPHYLOCOCCUS HAEMOLYTICUS    Report Status PENDING  Incomplete  Blood Culture ID Panel (Reflexed)     Status: Abnormal   Collection Time: 01/26/24  1:37 PM  Result Value Ref Range Status   Enterococcus faecalis DETECTED (A) NOT DETECTED Final    Comment: CRITICAL RESULT CALLED TO, READ BACK BY AND VERIFIED WITH: PHARMD G ABBOTT 01/27/2024 @ 0725 BY AB    Enterococcus Faecium NOT DETECTED NOT DETECTED Final   Listeria monocytogenes NOT DETECTED NOT DETECTED Final   Staphylococcus species DETECTED (A) NOT DETECTED Final    Comment: CRITICAL RESULT CALLED TO, READ BACK BY AND VERIFIED WITH: PHARMD G ABBOTT 01/27/2024 @ 0725 BY AB  Staphylococcus aureus (BCID) NOT DETECTED NOT DETECTED Final   Staphylococcus epidermidis DETECTED (A) NOT DETECTED Final    Comment: Methicillin (oxacillin) resistant coagulase negative staphylococcus. Possible blood culture contaminant (unless isolated from more than one blood culture draw or clinical case suggests pathogenicity). No antibiotic treatment is indicated for blood  culture contaminants. CRITICAL RESULT CALLED TO, READ BACK BY AND VERIFIED WITH: PHARMD G ABBOTT 01/27/2024 @ 0725 BY AB    Staphylococcus lugdunensis DETECTED (A) NOT DETECTED Final    Comment: Methicillin  (oxacillin) resistant coagulase negative staphylococcus. Possible blood culture contaminant (unless isolated from more than one blood culture draw or clinical case suggests pathogenicity). No antibiotic treatment is indicated for blood  culture contaminants. CRITICAL RESULT CALLED TO, READ BACK BY AND VERIFIED WITH: PHARMD G ABBOTT 01/27/2024 @ 0725 BY AB    Streptococcus species NOT DETECTED NOT DETECTED Final   Streptococcus agalactiae NOT DETECTED NOT DETECTED Final   Streptococcus pneumoniae NOT DETECTED NOT DETECTED Final   Streptococcus pyogenes NOT DETECTED NOT DETECTED Final   A.calcoaceticus-baumannii NOT DETECTED NOT DETECTED Final   Bacteroides fragilis NOT DETECTED NOT DETECTED Final   Enterobacterales NOT DETECTED NOT DETECTED Final   Enterobacter cloacae complex NOT DETECTED NOT DETECTED Final   Escherichia coli NOT DETECTED NOT DETECTED Final   Klebsiella aerogenes NOT DETECTED NOT DETECTED Final   Klebsiella oxytoca NOT DETECTED NOT DETECTED Final   Klebsiella pneumoniae NOT DETECTED NOT DETECTED Final   Proteus species NOT DETECTED NOT DETECTED Final   Salmonella species NOT DETECTED NOT DETECTED Final   Serratia marcescens NOT DETECTED NOT DETECTED Final   Haemophilus influenzae NOT DETECTED NOT DETECTED Final   Neisseria meningitidis NOT DETECTED NOT DETECTED Final   Pseudomonas aeruginosa NOT DETECTED NOT DETECTED Final   Stenotrophomonas maltophilia NOT DETECTED NOT DETECTED Final   Candida albicans NOT DETECTED NOT DETECTED Final   Candida auris NOT DETECTED NOT DETECTED Final   Candida glabrata NOT DETECTED NOT DETECTED Final   Candida krusei NOT DETECTED NOT DETECTED Final   Candida parapsilosis NOT DETECTED NOT DETECTED Final   Candida tropicalis NOT DETECTED NOT DETECTED Final   Cryptococcus neoformans/gattii NOT DETECTED NOT DETECTED Final   Methicillin resistance mecA/C DETECTED (A) NOT DETECTED Final    Comment: CRITICAL RESULT CALLED TO, READ BACK BY AND  VERIFIED WITH: PHARMD G ABBOTT 01/27/2024 @ 0725 BY AB    Vancomycin  resistance NOT DETECTED NOT DETECTED Final    Comment: Performed at [facility], [address], [place], [place] [address]  Remove and replace urinary cath (placed > 5 days) then obtain urine culture from new indwelling urinary catheter.     Status: Abnormal   Collection Time: 01/26/24  3:14 PM   Specimen: Urine, Catheterized  Result Value Ref Range Status   Specimen Description URINE, CATHETERIZED  Final   Special Requests   Final    NONE Performed at [facility], [address] [place], [place] [address]    Culture MULTIPLE SPECIES PRESENT, SUGGEST RECOLLECTION (A)  Final   Report Status 01/27/2024 FINAL  Final  Culture, blood (Routine X 2) w Reflex to ID Panel     Status: None (Preliminary result)   Collection Time: 01/28/24  6:47 AM   Specimen: BLOOD RIGHT HAND  Result Value Ref Range Status   Specimen Description BLOOD RIGHT HAND  Final   Special Requests   Final    BOTTLES DRAWN AEROBIC AND ANAEROBIC Blood Culture adequate volume   Culture  Final    NO GROWTH 1 DAY Performed at [facility], [address], [place], [place] [address]    Report Status PENDING  Incomplete  Culture, blood (Routine X 2) w Reflex to ID Panel     Status: None (Preliminary result)   Collection Time: 01/28/24  6:54 AM   Specimen: BLOOD RIGHT WRIST  Result Value Ref Range Status   Specimen Description BLOOD RIGHT WRIST  Final   Special Requests   Final    BOTTLES DRAWN AEROBIC AND ANAEROBIC Blood Culture adequate volume   Culture  Setup Time   Final    GRAM POSITIVE COCCI ANAEROBIC BOTTLE ONLY CRITICAL VALUE NOTED.  VALUE IS CONSISTENT WITH PREVIOUSLY REPORTED AND CALLED VALUE. Performed at [facility], [address], [place], [place] [address]    Culture [facility] POSITIVE COCCI  Final   Report Status PENDING  Incomplete     Radiology Studies: ECHOCARDIOGRAM COMPLETE Result Date: 01/29/2024     ECHOCARDIOGRAM REPORT   Patient Name:   Travis Mastel Date of Exam: 01/29/2024 Medical Rec #:  [id number]         Height:       66.0 in Accession #:    [id number]        Weight:       150.0 lb Date of Birth:  1946/05/06         BSA:          1.770 m Patient Age:    78 years          BP:           134/67 mmHg Patient Gender: M                 HR:           84 bpm. Exam Location:  Inpatient Procedure: 2D Echo, Cardiac Doppler and Color Doppler (Both Spectral and Color            Flow Doppler were utilized during procedure). Indications:    Bacteremia  History:        Patient has prior history of Echocardiogram examinations, most                 recent 11/22/2017. Risk Factors:Dyslipidemia and Hypertension.  Sonographer:    Juanita Shaw Referring Phys: Vayla Wilhelmi IMPRESSIONS  1. Left ventricular ejection fraction, by estimation, is 65 to 70%. The left ventricle has normal function. The left ventricle has no regional wall motion abnormalities. There is mild concentric left ventricular hypertrophy. Left ventricular diastolic parameters are consistent with Grade I diastolic dysfunction (impaired relaxation).  2. Right ventricular systolic function is normal. The right ventricular size is normal.  3. The mitral valve is normal in structure. Trivial mitral valve regurgitation. No evidence of mitral stenosis.  4. The aortic valve is tricuspid. There is mild calcification of the aortic valve. Aortic valve regurgitation is not visualized. Aortic valve sclerosis/calcification is present, without any evidence of aortic stenosis.  5. The inferior vena cava is normal in size with greater than 50% respiratory variability, suggesting right atrial pressure of 3 mmHg. Conclusion(s)/Recommendation(s): No evidence of valvular vegetations on this transthoracic echocardiogram. Consider a transesophageal echocardiogram to exclude infective endocarditis if clinically indicated. FINDINGS  Left Ventricle: Left ventricular ejection fraction,  by estimation, is 65 to 70%. The left ventricle has normal function. The left ventricle has no regional wall motion abnormalities. The left ventricular internal cavity size was normal in size. There  is  mild concentric left ventricular hypertrophy. Left ventricular diastolic parameters are consistent with Grade I diastolic dysfunction (impaired relaxation). Right Ventricle: The right ventricular size is normal. No increase in right ventricular wall thickness. Right ventricular systolic function is normal. Left Atrium: Left atrial size was normal in size. Right Atrium: Right atrial size was normal in size. Pericardium: There is no evidence of pericardial effusion. Mitral Valve: The mitral valve is normal in structure. Trivial mitral valve regurgitation. No evidence of mitral valve stenosis. MV peak gradient, 1.5 mmHg. The mean mitral valve gradient is 1.0 mmHg. Tricuspid Valve: The tricuspid valve is normal in structure. Tricuspid valve regurgitation is trivial. No evidence of tricuspid stenosis. Aortic Valve: The aortic valve is tricuspid. There is mild calcification of the aortic valve. Aortic valve regurgitation is not visualized. Aortic valve sclerosis/calcification is present, without any evidence of aortic stenosis. Aortic valve mean gradient measures 1.0 mmHg. Aortic valve peak gradient measures 2.7 mmHg. Aortic valve area, by VTI measures 3.14 cm. Pulmonic Valve: The pulmonic valve was normal in structure. Pulmonic valve regurgitation is not visualized. No evidence of pulmonic stenosis. Aorta: The aortic root is normal in size and structure. Venous: The inferior vena cava is normal in size with greater than 50% respiratory variability, suggesting right atrial pressure of 3 mmHg. IAS/Shunts: No atrial level shunt detected by color flow Doppler.  LEFT VENTRICLE PLAX 2D LVIDd:         4.40 cm      Diastology LVIDs:         2.70 cm      LV e' medial:    7.29 cm/s LV PW:         0.90 cm      LV E/e' medial:  6.7  LV IVS:        0.80 cm      LV e' lateral:   5.87 cm/s LVOT diam:     1.90 cm      LV E/e' lateral: 8.3 LV SV:         43 LV SV Index:   25 LVOT Area:     2.84 cm  LV Volumes (MOD) LV vol d, MOD A2C: 88.0 ml LV vol d, MOD A4C: 112.0 ml LV vol s, MOD A2C: 40.7 ml LV vol s, MOD A4C: 29.6 ml LV SV MOD A2C:     47.3 ml LV SV MOD A4C:     112.0 ml LV SV MOD BP:      65.9 ml RIGHT VENTRICLE             IVC RV Basal diam:  3.80 cm     IVC diam: 1.40 cm RV Mid diam:    2.80 cm RV S prime:     15.00 cm/s TAPSE (M-mode): 2.4 cm LEFT ATRIUM             Index        RIGHT ATRIUM           Index LA diam:        3.00 cm 1.70 cm/m   RA Area:     11.70 cm LA Vol (A2C):   23.2 ml 13.11 ml/m  RA Volume:   26.10 ml  14.75 ml/m LA Vol (A4C):   21.6 ml 12.21 ml/m LA Biplane Vol: 23.8 ml 13.45 ml/m  AORTIC VALVE                    PULMONIC VALVE AV Area (  Vmax):    2.75 cm     PV Vmax:       0.74 m/s AV Area (Vmean):   2.77 cm     PV Peak grad:  2.2 mmHg AV Area (VTI):     3.14 cm AV Vmax:           82.30 cm/s AV Vmean:          54.300 cm/s AV VTI:            0.138 m AV Peak Grad:      2.7 mmHg AV Mean Grad:      1.0 mmHg LVOT Vmax:         79.80 cm/s LVOT Vmean:        53.100 cm/s LVOT VTI:          0.153 m LVOT/AV VTI ratio: 1.11  AORTA Ao Root diam: 3.40 cm Ao Asc diam:  3.20 cm MITRAL VALVE MV Area (PHT): 2.87 cm    SHUNTS MV Area VTI:   3.04 cm    Systemic VTI:  0.15 m MV Peak grad:  1.5 mmHg    Systemic Diam: 1.90 cm MV Mean grad:  1.0 mmHg MV Vmax:       0.62 m/s MV Vmean:      48.4 cm/s MV Decel Time: 264 msec MV E velocity: 48.70 cm/s MV A velocity: 80.70 cm/s MV E/A ratio:  0.60 Jules Oar MD Electronically signed by Jules Oar MD Signature Date/Time: 01/29/2024/8:41:09 AM    Final     Scheduled Meds:  amLODipine   5 mg Oral Daily   atorvastatin   40 mg Oral QHS   Chlorhexidine  Gluconate Cloth  6 each Topical Daily   fluticasone   2 spray Each Nare Daily   heparin   5,000 Units Subcutaneous Q8H    linezolid   600 mg Oral Q12H   pantoprazole  (PROTONIX ) IV  40 mg Intravenous Q12H   Continuous Infusions:   LOS: 3 days   Time spent: 45 minutes  Unk Garb, DO  [facility]  01/29/2024, 10:15 AM

## 2024-01-29 NOTE — TOC Benefit Eligibility Note (Signed)
 Pharmacy Patient Advocate Encounter  Insurance verification completed.    The patient is insured through [employer]. Patient has Medicare and is not eligible for a copay card, but may be able to apply for patient assistance or Medicare RX Payment Plan (Patient Must reach out to their plan, if eligible for payment plan), if available.    Ran test claim for Linezolid  600mg  and the current 14 day co-pay is $0.   This test claim was processed through [facility]- copay amounts may vary at other pharmacies due to [employer], or as the patient moves through the different stages of their insurance plan.

## 2024-01-30 ENCOUNTER — Other Ambulatory Visit (HOSPITAL_COMMUNITY): Payer: Self-pay | Admitting: Pharmacist

## 2024-01-30 DIAGNOSIS — Z978 Presence of other specified devices: Secondary | ICD-10-CM

## 2024-01-30 DIAGNOSIS — T83511A Infection and inflammatory reaction due to indwelling urethral catheter, initial encounter: Secondary | ICD-10-CM | POA: Diagnosis not present

## 2024-01-30 DIAGNOSIS — N390 Urinary tract infection, site not specified: Secondary | ICD-10-CM | POA: Diagnosis not present

## 2024-01-30 DIAGNOSIS — E876 Hypokalemia: Secondary | ICD-10-CM | POA: Insufficient documentation

## 2024-01-30 DIAGNOSIS — R7881 Bacteremia: Secondary | ICD-10-CM | POA: Diagnosis not present

## 2024-01-30 DIAGNOSIS — G309 Alzheimer's disease, unspecified: Secondary | ICD-10-CM | POA: Diagnosis not present

## 2024-01-30 LAB — CBC WITH DIFFERENTIAL/PLATELET
Abs Immature Granulocytes: 0.18 10*3/uL — ABNORMAL HIGH (ref 0.00–0.07)
Basophils Absolute: 0.1 10*3/uL (ref 0.0–0.1)
Basophils Relative: 0 %
Eosinophils Absolute: 0.1 10*3/uL (ref 0.0–0.5)
Eosinophils Relative: 1 %
HCT: 30 % — ABNORMAL LOW (ref 39.0–52.0)
Hemoglobin: 9.7 g/dL — ABNORMAL LOW (ref 13.0–17.0)
Immature Granulocytes: 1 %
Lymphocytes Relative: 13 %
Lymphs Abs: 1.9 10*3/uL (ref 0.7–4.0)
MCH: 27.9 pg (ref 26.0–34.0)
MCHC: 32.3 g/dL (ref 30.0–36.0)
MCV: 86.2 fL (ref 80.0–100.0)
Monocytes Absolute: 2.1 10*3/uL — ABNORMAL HIGH (ref 0.1–1.0)
Monocytes Relative: 14 %
Neutro Abs: 10.2 10*3/uL — ABNORMAL HIGH (ref 1.7–7.7)
Neutrophils Relative %: 71 %
Platelets: 304 10*3/uL (ref 150–400)
RBC: 3.48 MIL/uL — ABNORMAL LOW (ref 4.22–5.81)
RDW: 16.4 % — ABNORMAL HIGH (ref 11.5–15.5)
WBC: 14.5 10*3/uL — ABNORMAL HIGH (ref 4.0–10.5)
nRBC: 0 % (ref 0.0–0.2)

## 2024-01-30 LAB — CULTURE, BLOOD (ROUTINE X 2)

## 2024-01-30 LAB — BASIC METABOLIC PANEL WITH GFR
Anion gap: 9 (ref 5–15)
BUN: 8 mg/dL (ref 8–23)
CO2: 19 mmol/L — ABNORMAL LOW (ref 22–32)
Calcium: 8.2 mg/dL — ABNORMAL LOW (ref 8.9–10.3)
Chloride: 110 mmol/L (ref 98–111)
Creatinine, Ser: 0.86 mg/dL (ref 0.61–1.24)
GFR, Estimated: >60 mL/min (ref >60)
Glucose, Bld: 123 mg/dL — ABNORMAL HIGH (ref 70–99)
Potassium: 3.2 mmol/L — ABNORMAL LOW (ref 3.5–5.1)
Sodium: 138 mmol/L (ref 135–145)

## 2024-01-30 MED ORDER — AMLODIPINE BESYLATE 5 MG PO TABS: 5.0000 mg | ORAL_TABLET | Freq: Every day | ORAL | Status: DC

## 2024-01-30 MED ORDER — LINEZOLID 600 MG PO TABS
600.0000 mg | ORAL_TABLET | Freq: Two times a day (BID) | ORAL | Status: AC
Start: 2024-01-30 — End: 2024-02-06

## 2024-01-30 NOTE — Assessment & Plan Note (Signed)
 01-30-2024 continue with foley catheter. Will need it changed out monthly. Next change date is Feb 24, 2024

## 2024-01-30 NOTE — Discharge Summary (Signed)
 [facility] Discharge Summary   Patient name: Austin Weaver  Admit date:     01/26/2024  Discharge date: 01/30/2024  Attending Physician: Aura Leeds LATIF [[id number]]  Discharge Physician: Unk Garb   PCP: [place] La  Admitted From: SN[facility] Disposition:   [facility]  Recommendations for Outpatient Follow-up:  Follow up with PCP in 1-2 weeks Please follow up on the following pending results: repeat blood cx from 01-28-2024  Home Health:No Equipment/Devices: Chronic indwelling Foley. Needs to be changed out every 30 days. Next change date is Feb 24, 2024.  Discharge Condition:Hospice CODE STATUS:DNR/DNI Diet recommendation: Regular Fluid Restriction: None  Hospital Summary: HPI: Austin Weaver is a 78 y.o. male with medical history significant for but not limited to history of TIA and CVA, recurrent falls, metastatic carcinoma of unknown primary site but has a T8 vertebral (unknown if primary site or metastasis) body mass with epidural spread causing severe spinal canal stenosis and compression of the spinal cord with resultant flaccid paraplegia and urinary retention, hypertension, hyperlipidemia who is currently on hospice who presents from his nursing facility for a single episode of coffee-ground emesis in the morning as well as mucopurulent discharge from his penis and cloudy urine.  Patient's facility tried to change his catheter but had difficulty if I was able to do so.  Given his coffee-ground emesis and purulent urine they sent him for further evaluation to the ED.  Patient is a poor historian himself and has a history of dementia and feels okay at this time.  The case was discussed with his legal guardian who wants further evaluation and workup and is okay with antibiotics, fluids as well as further investigation including EGD.  Patient was found to have purulent urine and was placed on the PPI drip and [facility] was asked to admit this patient  for urinary tract infection and upon evaluation patient was found to be in severe sepsis given his lactic acid level being elevated so sepsis protocol initiated on the patient.   Significant Events: Admitted 01/26/2024 for UTI due to indwelling foley catheter   Significant Labs: VBG ph 7.65, PCO2 15 Lactic acid 3.6 WBC 17.9, HgB 11, Plt 340 Na 137, K 3.7, BUN 27, Scr 1.42, glu 106 Iron 19, TIB 165, %sat 12 Blood cx 01-26-2024 staph epi, staph lugdunensis, enterococcus faecalis  Significant Imaging Studies: CXR Mild right basilar opacity as noted above  Renal U/S Limited study. The bladder and right kidney are not imaged at this time. Left kidney has some cysts as seen previously but is also incompletely evaluated. Repeat attempt can be performed when the patient is more clinically able. Echo negative for vegetations. LVEF 65%  Antibiotic Therapy: Anti-infectives (From admission, onward)    Start     Dose/Rate Route Frequency Ordered Stop   01/29/24 1000  vancomycin  (VANCOCIN ) IVPB 1000 mg/200 mL premix        1,000 mg 200 mL/hr over 60 Minutes Intravenous Every 48 hours 01/27/24 0741     01/27/24 1200  cefTRIAXone  (ROCEPHIN ) 1 g in sodium chloride  0.9 % 100 mL IVPB  Status:  Discontinued        1 g 200 mL/hr over 30 Minutes Intravenous Every 24 hours 01/26/24 1547 01/27/24 0741   01/27/24 0830  vancomycin  (VANCOREADY) IVPB 1500 mg/300 mL        1,500 mg 150 mL/hr over 120 Minutes Intravenous  Once 01/27/24 0741     01/26/24 1600  azithromycin  (ZITHROMAX ) 500 mg  in sodium chloride  0.9 % 250 mL IVPB  Status:  Discontinued        500 mg 250 mL/hr over 60 Minutes Intravenous Every 24 hours 01/26/24 1547 01/27/24 0741   01/26/24 1230  cefTRIAXone  (ROCEPHIN ) 1 g in sodium chloride  0.9 % 100 mL IVPB        1 g 200 mL/hr over 30 Minutes Intravenous  Once 01/26/24 1217 01/26/24 1450       Procedures:   Consultants: ID   Hospital Course by Problem: * UTI (urinary tract  infection) due to urinary indwelling Foley catheter (HCC) 01-27-2024 foley changed out in ER. On IV ABX for septicemia with enterococcus and 2 different staph species.  01-28-2024 continue with IV daptomycin  per ID consult  01-29-2024 ID changed abx over to po Zyvox .  01-30-2024 DC to SNF to complete 10 total days of abx.  Completed 1 day of IV daptomycin . Has had 2 days of in-house Zyvox . Will DC to SNF with 7 days of Zyvox . Hold remeron while on Zyvox . Can restart Remeron on Feb 13, 2024  Bacteremia due to Enterococcus 01-27-2024 foley changed out in ER. On IV ABX for septicemia with enterococcus and 2 different staph species.  01-28-2024 continue with IV daptomycin  per ID consult.  01-29-2024 ID changed abx over to po Zyvox .  01-30-2024 DC to SNF to complete 10 total days of abx.  Completed 1 day of IV daptomycin . Has had 2 days of in-house Zyvox . Will DC to SNF with 7 days of Zyvox . Hold remeron while on Zyvox . Can restart Remeron on Feb 13, 2024  Bacteremia due to Staphylococcus 01-27-2024 foley changed out in ER. On IV ABX for septicemia with enterococcus and 2 different staph species. ID has ordered surface echo.  01-28-2024 continue with IV daptomycin  per ID consult. Awaiting echo.  01-29-2024 ID changed abx over to po Zyvox . Awaiting echo results today.  Repeat blood cx 01-28-2024 growing GPC from anaerobic bottle only.  01-30-2024 DC to SNF to complete 10 total days of abx.  Completed 1 day of IV daptomycin . Has had 2 days of in-house Zyvox . Will DC to SNF with 7 days of Zyvox . Hold remeron while on Zyvox . Can restart Remeron on Feb 13, 2024  Prostate cancer metastatic to bone [facility]) 01-27-2024 chronic.  01-28-2024 stable.  01-29-2024 stable.  01-30-2024 stable.  Hospice care patient 01-27-2024 pt is a hospice client.  01-28-2024 stable.  01-29-2024 stable.  01-30-2024 stable. Continue DNR.  Essential hypertension 01-27-2024 stable. Hold hydrochlorothiazide  for  now. BP stable without HTN meds.  01-28-2024 stable. Restart low dose norvasc .  01-29-2024 stable. BP improved with restarting norvasc .  Still holding hydrochlorothiazide .  01-30-2024 stable.BP stable on norvasc  alone. Stop hydrochlorothiazide  at discharge.  Alzheimer's dementia with behavioral disturbance (HCC) 01-27-2024 chronic.  01-28-2024 stable.  01-29-2024 stable.  01-30-2024 stable.  Hypokalemia 01-27-2024 repleted with po kcl.  Chronic indwelling Foley catheter 01-30-2024 continue with foley catheter. Will need it changed out monthly. Next change date is Feb 24, 2024  Pressure injury    WOC Nurse Consult Note: Reason for Consult: Requested to assess a PI stage 2 comes from the facility. Pt ts restrict to bed, not able to move by himself. Wound type: Pressure injury stage 3. Pressure Injury POA: Yes Measurement: 12 x 6 cm x 0.1 cm. sacrum Wound bed: 100% red. Drainage (amount, consistency, odor) Moderate amount, no odor, serous. The dressing was saturated when I visited the pt at 1030. Periwound: intact. Dressing procedure/placement/frequency: Apply silver alginate  on the wound bed, cover with sacral foam dressing, change every 3 days or PRN soiling or if the dressing is saturated.    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  01-30-2024 stable.  AKI (acute kidney injury) (HCC) 01-26-2024 treated with IVF. Scr returned to baseline. Likely due to UTI, occluded foley catheter.    Discharge Diagnoses:  Principal Problem:   UTI (urinary tract infection) due to urinary indwelling Foley catheter (HCC) Active Problems:   Bacteremia due to Enterococcus   Bacteremia due to Staphylococcus   Alzheimer's dementia with behavioral disturbance [facility])   Essential hypertension   Hospice care patient   Prostate cancer metastatic to bone (HCC)   AKI (acute  kidney injury) (HCC)   Hemiplegia of left nondominant side as late effect of cerebrovascular disease (HCC)   DNR (do not resuscitate)/DNI(Do Not intubate)   Pressure injury   Chronic indwelling Foley catheter   Hypokalemia   Discharge Instructions  Discharge Instructions     Call MD for:  difficulty breathing, headache or visual disturbances   Complete by: As directed    Call MD for:  extreme fatigue   Complete by: As directed    Call MD for:  hives   Complete by: As directed    Call MD for:  persistant dizziness or light-headedness   Complete by: As directed    Call MD for:  persistant nausea and vomiting   Complete by: As directed    Call MD for:  severe uncontrolled pain   Complete by: As directed    Call MD for:  temperature >100.4   Complete by: As directed    Catheter care   Complete by: As directed    Indwelling foley to be changed out every 30 days. Next change Feb 24, 2024.   Diet - low sodium heart healthy   Complete by: As directed    Discharge instructions   Complete by: As directed    1. Follow up with your primary care provider in 1-2 weeks following discharge from hospital.   Discharge wound care:   Complete by: As directed    Continue wound care at SNF as previously ordered   Increase activity slowly   Complete by: As directed       Allergies as of 01/30/2024   No Known Allergies      Medication List     PAUSE taking these medications    mirtazapine 7.5 MG tablet Wait to take this until: Feb 13, 2024 Commonly known as: REMERON Take 7.5 mg by mouth at bedtime.       STOP taking these medications    fluticasone  50 MCG/ACT nasal spray Commonly known as: FLONASE    hydrochlorothiazide  12.5 MG tablet Commonly known as: HYDRODIURIL        TAKE these medications    acetaminophen  325 MG tablet Commonly known as: TYLENOL  Take 650 mg by mouth every 6 (six) hours as needed for mild pain (pain score 1-3) or moderate pain (pain score 4-6).    acetaminophen  500 MG tablet Commonly known as: TYLENOL  Take 1,000 mg by mouth in the morning and at bedtime.   amLODipine  5 MG tablet Commonly known as: NORVASC  Take 1 tablet (5 mg total) by mouth daily. What changed:  medication strength how much to take   aspirin  EC 81 MG tablet Take 81 mg by mouth daily. Swallow whole.   atorvastatin  40 MG  tablet Commonly known as: LIPITOR  Take 1 tablet (40 mg total) by mouth daily. What changed: when to take this   bisacodyl  5 MG EC tablet Commonly known as: DULCOLAX Take 1 tablet (5 mg total) by mouth daily as needed for moderate constipation.   linezolid  600 MG tablet Commonly known as: ZYVOX  Take 1 tablet (600 mg total) by mouth every 12 (twelve) hours for 7 days.   metroNIDAZOLE  500 MG tablet Commonly known as: FLAGYL  Take 500 mg by mouth See admin instructions. Sacral would topically every day shift for wound healing: Crush tablet and sprinkle into sacral wound bed, pack with calcium  alginate with silver, zinc  oxide surrounding tissue, secure with a foam dressing.   Vitamin D (Ergocalciferol) 1.25 MG (50000 UNIT) Caps capsule Commonly known as: DRISDOL Take 50,000 Units by mouth every 7 (seven) days. Every Tuesday               Discharge Care Instructions  (From admission, onward)           Start     Ordered   01/30/24 0000  Discharge wound care:       Comments: Continue wound care at SNF as previously ordered   01/30/24 0827            No Known Allergies  Discharge Exam: Vitals:   01/29/24 2053 01/30/24 0418  BP: 104/85 131/67  Pulse: 100 93  Resp: 19 19  Temp: 99.4 F (37.4 C) 99.1 F (37.3 C)  SpO2: 98% 98%    Physical Exam Vitals and nursing note reviewed.  Constitutional:      General: He is not in acute distress.    Appearance: He is not toxic-appearing or diaphoretic.  HENT:     Head: Normocephalic and atraumatic.  Eyes:     General: No scleral icterus. Cardiovascular:     Rate and  Rhythm: Normal rate and regular rhythm.  Pulmonary:     Effort: Pulmonary effort is normal. No respiratory distress.     Breath sounds: No wheezing.  Abdominal:     General: Bowel sounds are normal.     Palpations: Abdomen is soft.  Genitourinary:    Comments: +foley Skin:    General: Skin is warm and dry.  Neurological:     Mental Status: He is alert.     The results of significant diagnostics from this hospitalization (including imaging, microbiology, ancillary and laboratory) are listed below for reference.    Microbiology: Recent Results (from the past 240 hours)  Resp panel by RT-PCR (RSV, Flu A&B, Covid) Anterior Nasal Swab     Status: None   Collection Time: 01/26/24 12:18 PM   Specimen: Anterior Nasal Swab  Result Value Ref Range Status   SARS Coronavirus 2 by RT PCR NEGATIVE NEGATIVE Final   Influenza A by PCR NEGATIVE NEGATIVE Final   Influenza B by PCR NEGATIVE NEGATIVE Final    Comment: (NOTE) The Xpert Xpress SARS-CoV-2/FLU/RSV plus assay is intended as an aid in the diagnosis of influenza from Nasopharyngeal swab specimens and should not be used as a sole basis for treatment. Nasal washings and aspirates are unacceptable for Xpert Xpress SARS-CoV-2/FLU/RSV testing.  Fact Sheet for Patients: BloggerCourse.com  Fact Sheet for Healthcare Providers: SeriousBroker.it  This test is not yet approved or cleared by the [place]  FDA and has been authorized for detection and/or diagnosis of SARS-CoV-2 by FDA under an Emergency Use Authorization (EUA). This EUA will remain in effect (meaning this test can be  used) for the duration of the COVID-19 declaration under Section 564(b)(1) of the Act, 21 U.S.C. section 360bbb-3(b)(1), unless the authorization is terminated or revoked.     Resp Syncytial Virus by PCR NEGATIVE NEGATIVE Final    Comment: (NOTE) Fact Sheet for  Patients: BloggerCourse.com  Fact Sheet for Healthcare Providers: SeriousBroker.it  This test is not yet approved or cleared by the [place]  FDA and has been authorized for detection and/or diagnosis of SARS-CoV-2 by FDA under an Emergency Use Authorization (EUA). This EUA will remain in effect (meaning this test can be used) for the duration of the COVID-19 declaration under Section 564(b)(1) of the Act, 21 U.S.C. section 360bbb-3(b)(1), unless the authorization is terminated or revoked.  Performed at [facility], [address], [place], [place] [address]   Culture, blood (routine x 2)     Status: None (Preliminary result)   Collection Time: 01/26/24  1:37 PM   Specimen: BLOOD RIGHT HAND  Result Value Ref Range Status   Specimen Description BLOOD RIGHT HAND  Final   Special Requests   Final    BOTTLES DRAWN AEROBIC AND ANAEROBIC Blood Culture results may not be optimal due to an inadequate volume of blood received in culture bottles   Culture   Final    NO GROWTH 3 DAYS Performed at [facility], [address] [place], [place] [address]    Report Status PENDING  Incomplete  Culture, blood (routine x 2)     Status: Abnormal (Preliminary result)   Collection Time: 01/26/24  1:37 PM   Specimen: BLOOD  Result Value Ref Range Status   Specimen Description BLOOD THUMB  Final   Special Requests   Final    BOTTLES DRAWN AEROBIC AND ANAEROBIC Blood Culture results may not be optimal due to an inadequate volume of blood received in culture bottles   Culture  Setup Time   Final    GRAM POSITIVE COCCI IN BOTH AEROBIC AND ANAEROBIC BOTTLES CRITICAL RESULT CALLED TO, READ BACK BY AND VERIFIED WITH: PHARMD G ABBOTT 01/27/2024 @ 0725 BY AB    Culture (A)  Final    ENTEROCOCCUS FAECALIS STAPHYLOCOCCUS LUGDUNENSIS SUSCEPTIBILITIES TO FOLLOW STAPHYLOCOCCUS EPIDERMIDIS STAPHYLOCOCCUS HAEMOLYTICUS THE SIGNIFICANCE OF  ISOLATING THIS ORGANISM FROM A SINGLE SET OF BLOOD CULTURES WHEN MULTIPLE SETS ARE DRAWN IS UNCERTAIN. PLEASE NOTIFY THE MICROBIOLOGY DEPARTMENT WITHIN ONE WEEK IF SPECIATION AND SENSITIVITIES ARE REQUIRED. Performed at [facility], [address], [place], [place] [address]    Report Status PENDING  Incomplete  Blood Culture ID Panel (Reflexed)     Status: Abnormal   Collection Time: 01/26/24  1:37 PM  Result Value Ref Range Status   Enterococcus faecalis DETECTED (A) NOT DETECTED Final    Comment: CRITICAL RESULT CALLED TO, READ BACK BY AND VERIFIED WITH: PHARMD G ABBOTT 01/27/2024 @ 0725 BY AB    Enterococcus Faecium NOT DETECTED NOT DETECTED Final   Listeria monocytogenes NOT DETECTED NOT DETECTED Final   Staphylococcus species DETECTED (A) NOT DETECTED Final    Comment: CRITICAL RESULT CALLED TO, READ BACK BY AND VERIFIED WITH: PHARMD G ABBOTT 01/27/2024 @ 0725 BY AB    Staphylococcus aureus (BCID) NOT DETECTED NOT DETECTED Final   Staphylococcus epidermidis DETECTED (A) NOT DETECTED Final    Comment: Methicillin (oxacillin) resistant coagulase negative staphylococcus. Possible blood culture contaminant (unless isolated from more than one blood culture draw or clinical case suggests pathogenicity). No antibiotic treatment is indicated for blood  culture contaminants. CRITICAL RESULT CALLED TO, READ BACK BY AND VERIFIED WITH: PHARMD G ABBOTT 01/27/2024 @ 0725 BY AB    Staphylococcus lugdunensis DETECTED (A) NOT DETECTED Final    Comment: Methicillin (oxacillin) resistant coagulase negative staphylococcus. Possible blood culture contaminant (unless isolated from more than one blood culture draw or clinical case suggests pathogenicity). No antibiotic treatment is indicated for blood  culture contaminants. CRITICAL RESULT CALLED TO, READ BACK BY AND VERIFIED WITH: PHARMD G ABBOTT 01/27/2024 @ 0725 BY AB    Streptococcus species NOT DETECTED NOT DETECTED Final   Streptococcus  agalactiae NOT DETECTED NOT DETECTED Final   Streptococcus pneumoniae NOT DETECTED NOT DETECTED Final   Streptococcus pyogenes NOT DETECTED NOT DETECTED Final   A.calcoaceticus-baumannii NOT DETECTED NOT DETECTED Final   Bacteroides fragilis NOT DETECTED NOT DETECTED Final   Enterobacterales NOT DETECTED NOT DETECTED Final   Enterobacter cloacae complex NOT DETECTED NOT DETECTED Final   Escherichia coli NOT DETECTED NOT DETECTED Final   Klebsiella aerogenes NOT DETECTED NOT DETECTED Final   Klebsiella oxytoca NOT DETECTED NOT DETECTED Final   Klebsiella pneumoniae NOT DETECTED NOT DETECTED Final   Proteus species NOT DETECTED NOT DETECTED Final   Salmonella species NOT DETECTED NOT DETECTED Final   Serratia marcescens NOT DETECTED NOT DETECTED Final   Haemophilus influenzae NOT DETECTED NOT DETECTED Final   Neisseria meningitidis NOT DETECTED NOT DETECTED Final   Pseudomonas aeruginosa NOT DETECTED NOT DETECTED Final   Stenotrophomonas maltophilia NOT DETECTED NOT DETECTED Final   Candida albicans NOT DETECTED NOT DETECTED Final   Candida auris NOT DETECTED NOT DETECTED Final   Candida glabrata NOT DETECTED NOT DETECTED Final   Candida krusei NOT DETECTED NOT DETECTED Final   Candida parapsilosis NOT DETECTED NOT DETECTED Final   Candida tropicalis NOT DETECTED NOT DETECTED Final   Cryptococcus neoformans/gattii NOT DETECTED NOT DETECTED Final   Methicillin resistance mecA/C DETECTED (A) NOT DETECTED Final    Comment: CRITICAL RESULT CALLED TO, READ BACK BY AND VERIFIED WITH: PHARMD G ABBOTT 01/27/2024 @ 0725 BY AB    Vancomycin  resistance NOT DETECTED NOT DETECTED Final    Comment: Performed at [facility], [address], [place], [place] [address]  Remove and replace urinary cath (placed > 5 days) then obtain urine culture from new indwelling urinary catheter.     Status: Abnormal   Collection Time: 01/26/24  3:14 PM   Specimen: Urine, Catheterized  Result Value Ref Range  Status   Specimen Description URINE, CATHETERIZED  Final   Special Requests   Final    NONE Performed at [facility], [address] [place], [place] [address]    Culture MULTIPLE SPECIES PRESENT, SUGGEST RECOLLECTION (A)  Final   Report Status 01/27/2024 FINAL  Final  Culture, blood (Routine X 2) w Reflex to ID Panel     Status: None (Preliminary result)   Collection Time: 01/28/24  6:47 AM   Specimen: BLOOD RIGHT HAND  Result Value Ref Range Status   Specimen Description BLOOD RIGHT HAND  Final   Special Requests   Final    BOTTLES DRAWN AEROBIC AND ANAEROBIC Blood Culture adequate volume   Culture   Final    NO GROWTH 1 DAY Performed at [facility], [address], [place], [place] [address]    Report Status PENDING  Incomplete  Culture, blood (Routine X 2) w Reflex to ID Panel     Status: None (Preliminary result)   Collection  Time: 01/28/24  6:54 AM   Specimen: BLOOD RIGHT WRIST  Result Value Ref Range Status   Specimen Description BLOOD RIGHT WRIST  Final   Special Requests   Final    BOTTLES DRAWN AEROBIC AND ANAEROBIC Blood Culture adequate volume   Culture  Setup Time   Final    GRAM POSITIVE COCCI ANAEROBIC BOTTLE ONLY CRITICAL VALUE NOTED.  VALUE IS CONSISTENT WITH PREVIOUSLY REPORTED AND CALLED VALUE. Performed at [facility], [address], [place], [place] [address]    Culture [facility] POSITIVE COCCI  Final   Report Status PENDING  Incomplete     Labs:  Basic Metabolic Panel: Recent Labs  Lab 01/26/24 1316 01/26/24 1337 01/27/24 0619  NA 138  138 137 137  K 4.1  4.2 3.7 3.1*  CL 110 104 105  CO2  --  22 20*  GLUCOSE 97 106* 90  BUN 31* 27* 17  CREATININE 1.50* 1.42* 0.86  CALCIUM   --  8.7* 8.2*   Liver Function Tests: Recent Labs  Lab 01/26/24 1337 01/27/24 0619  AST 15 13*  ALT 10 8  ALKPHOS 58 45  BILITOT 0.8 0.8  PROT 7.6 6.4*  ALBUMIN 2.5* 2.2*   CBC: Recent Labs  Lab 01/26/24 1337 01/26/24 2347  01/27/24 0619 01/27/24 1314 01/27/24 1753 01/27/24 2327 01/28/24 0647 01/29/24 0943  WBC 17.9*  --  12.6*  --   --   --   --  10.3  NEUTROABS 15.3*  --  9.2*  --   --   --   --  7.3  HGB 11.0*   < > 9.4* 9.4* 9.9* 9.5* 9.9* 10.7*  HCT 34.4*   < > 29.2* 29.1* 31.0* 29.8* 30.9* 33.0*  MCV 87.3  --  86.9  --   --   --   --  87.1  PLT 340  --  310  --   --   --   --  327   < > = values in this interval not displayed.   Cardiac Enzymes: Recent Labs  Lab 01/28/24 0647  CKTOTAL 96   CBG: Recent Labs  Lab 01/26/24 1312  GLUCAP 110*   Urinalysis    Component Value Date/Time   COLORURINE YELLOW 01/26/2024 1732   APPEARANCEUR TURBID (A) 01/26/2024 1732   LABSPEC 1.015 01/26/2024 1732   PHURINE 6.0 01/26/2024 1732   GLUCOSEU NEGATIVE 01/26/2024 1732   HGBUR SMALL (A) 01/26/2024 1732   BILIRUBINUR NEGATIVE 01/26/2024 1732   KETONESUR NEGATIVE 01/26/2024 1732   PROTEINUR 100 (A) 01/26/2024 1732   NITRITE NEGATIVE 01/26/2024 1732   LEUKOCYTESUR MODERATE (A) 01/26/2024 1732   Sepsis Labs Recent Labs  Lab 01/26/24 1337 01/26/24 1417 01/27/24 0619 01/29/24 0943  PROCALCITON  --  0.71  --   --   WBC 17.9*  --  12.6* 10.3   Procedures/Studies: ECHOCARDIOGRAM COMPLETE Result Date: 01/29/2024    ECHOCARDIOGRAM REPORT   Patient Name:   Shields Slifko Date of Exam: 01/29/2024 Medical Rec #:  [id number]         Height:       66.0 in Accession #:    [id number]        Weight:       150.0 lb Date of Birth:  Mar 13, 1946         BSA:          1.770 m Patient Age:    78 years  BP:           134/67 mmHg Patient Gender: M                 HR:           84 bpm. Exam Location:  Inpatient Procedure: 2D Echo, Cardiac Doppler and Color Doppler (Both Spectral and Color            Flow Doppler were utilized during procedure). Indications:    Bacteremia  History:        Patient has prior history of Echocardiogram examinations, most                 recent 11/22/2017. Risk Factors:Dyslipidemia and  Hypertension.  Sonographer:    Juanita Shaw Referring Phys: Mikala Podoll IMPRESSIONS  1. Left ventricular ejection fraction, by estimation, is 65 to 70%. The left ventricle has normal function. The left ventricle has no regional wall motion abnormalities. There is mild concentric left ventricular hypertrophy. Left ventricular diastolic parameters are consistent with Grade I diastolic dysfunction (impaired relaxation).  2. Right ventricular systolic function is normal. The right ventricular size is normal.  3. The mitral valve is normal in structure. Trivial mitral valve regurgitation. No evidence of mitral stenosis.  4. The aortic valve is tricuspid. There is mild calcification of the aortic valve. Aortic valve regurgitation is not visualized. Aortic valve sclerosis/calcification is present, without any evidence of aortic stenosis.  5. The inferior vena cava is normal in size with greater than 50% respiratory variability, suggesting right atrial pressure of 3 mmHg. Conclusion(s)/Recommendation(s): No evidence of valvular vegetations on this transthoracic echocardiogram. Consider a transesophageal echocardiogram to exclude infective endocarditis if clinically indicated. FINDINGS  Left Ventricle: Left ventricular ejection fraction, by estimation, is 65 to 70%. The left ventricle has normal function. The left ventricle has no regional wall motion abnormalities. The left ventricular internal cavity size was normal in size. There is  mild concentric left ventricular hypertrophy. Left ventricular diastolic parameters are consistent with Grade I diastolic dysfunction (impaired relaxation). Right Ventricle: The right ventricular size is normal. No increase in right ventricular wall thickness. Right ventricular systolic function is normal. Left Atrium: Left atrial size was normal in size. Right Atrium: Right atrial size was normal in size. Pericardium: There is no evidence of pericardial effusion. Mitral Valve: The mitral valve  is normal in structure. Trivial mitral valve regurgitation. No evidence of mitral valve stenosis. MV peak gradient, 1.5 mmHg. The mean mitral valve gradient is 1.0 mmHg. Tricuspid Valve: The tricuspid valve is normal in structure. Tricuspid valve regurgitation is trivial. No evidence of tricuspid stenosis. Aortic Valve: The aortic valve is tricuspid. There is mild calcification of the aortic valve. Aortic valve regurgitation is not visualized. Aortic valve sclerosis/calcification is present, without any evidence of aortic stenosis. Aortic valve mean gradient measures 1.0 mmHg. Aortic valve peak gradient measures 2.7 mmHg. Aortic valve area, by VTI measures 3.14 cm. Pulmonic Valve: The pulmonic valve was normal in structure. Pulmonic valve regurgitation is not visualized. No evidence of pulmonic stenosis. Aorta: The aortic root is normal in size and structure. Venous: The inferior vena cava is normal in size with greater than 50% respiratory variability, suggesting right atrial pressure of 3 mmHg. IAS/Shunts: No atrial level shunt detected by color flow Doppler.  LEFT VENTRICLE PLAX 2D LVIDd:         4.40 cm      Diastology LVIDs:         2.70 cm  LV e' medial:    7.29 cm/s LV PW:         0.90 cm      LV E/e' medial:  6.7 LV IVS:        0.80 cm      LV e' lateral:   5.87 cm/s LVOT diam:     1.90 cm      LV E/e' lateral: 8.3 LV SV:         43 LV SV Index:   25 LVOT Area:     2.84 cm  LV Volumes (MOD) LV vol d, MOD A2C: 88.0 ml LV vol d, MOD A4C: 112.0 ml LV vol s, MOD A2C: 40.7 ml LV vol s, MOD A4C: 29.6 ml LV SV MOD A2C:     47.3 ml LV SV MOD A4C:     112.0 ml LV SV MOD BP:      65.9 ml RIGHT VENTRICLE             IVC RV Basal diam:  3.80 cm     IVC diam: 1.40 cm RV Mid diam:    2.80 cm RV S prime:     15.00 cm/s TAPSE (M-mode): 2.4 cm LEFT ATRIUM             Index        RIGHT ATRIUM           Index LA diam:        3.00 cm 1.70 cm/m   RA Area:     11.70 cm LA Vol (A2C):   23.2 ml 13.11 ml/m  RA Volume:    26.10 ml  14.75 ml/m LA Vol (A4C):   21.6 ml 12.21 ml/m LA Biplane Vol: 23.8 ml 13.45 ml/m  AORTIC VALVE                    PULMONIC VALVE AV Area (Vmax):    2.75 cm     PV Vmax:       0.74 m/s AV Area (Vmean):   2.77 cm     PV Peak grad:  2.2 mmHg AV Area (VTI):     3.14 cm AV Vmax:           82.30 cm/s AV Vmean:          54.300 cm/s AV VTI:            0.138 m AV Peak Grad:      2.7 mmHg AV Mean Grad:      1.0 mmHg LVOT Vmax:         79.80 cm/s LVOT Vmean:        53.100 cm/s LVOT VTI:          0.153 m LVOT/AV VTI ratio: 1.11  AORTA Ao Root diam: 3.40 cm Ao Asc diam:  3.20 cm MITRAL VALVE MV Area (PHT): 2.87 cm    SHUNTS MV Area VTI:   3.04 cm    Systemic VTI:  0.15 m MV Peak grad:  1.5 mmHg    Systemic Diam: 1.90 cm MV Mean grad:  1.0 mmHg MV Vmax:       0.62 m/s MV Vmean:      48.4 cm/s MV Decel Time: 264 msec MV E velocity: 48.70 cm/s MV A velocity: 80.70 cm/s MV E/A ratio:  0.60 Jules Oar MD Electronically signed by Jules Oar MD Signature Date/Time: 01/29/2024/8:41:09 AM    Final    X-ray chest PA and lateral Result Date:  01/27/2024 CLINICAL DATA:  [id number] CAP (community acquired pneumonia) [id number] EXAM: CHEST - 2 VIEW COMPARISON:  January 26, 2024 FINDINGS: Small to moderate volume right pleural effusion with right basilar atelectasis. No pneumothorax. Mild cardiomegaly. Tortuous aorta with aortic atherosclerosis. No acute fracture or destructive lesions. Multilevel thoracic osteophytosis. IMPRESSION: Small to moderate volume right pleural effusion with right basilar atelectasis. Electronically Signed   By: Rance Burrows M.D.   On: 01/27/2024 09:08   US  RENAL Result Date: 01/26/2024 CLINICAL DATA:  Acute kidney injury. EXAM: RENAL / URINARY TRACT ULTRASOUND COMPLETE COMPARISON:  Ultrasound 09/22/2018 FINDINGS: Study is limited as the patient had difficulty tolerating the procedure due to discomfort and body contraction. Poor sonographic window. The right kidney is not imaged. Left  kidney grossly measures 9.3 x 6.3 x 6.1 cm. Volume 184 cc. Cystic areas are identified along the kidney measuring up to 6.6 cm and is smaller measuring 2.2 cm. The larger focus appears relatively simple in the smaller has some thin septations. These were seen previously. Bladder is not imaged. IMPRESSION: Limited study. The bladder and right kidney are not imaged at this time. Left kidney has some cysts as seen previously but is also incompletely evaluated. Repeat attempt can be performed when the patient is more clinically able. Electronically Signed   By: Adrianna Horde M.D.   On: 01/26/2024 17:00   DG Chest Port 1 View Result Date: 01/26/2024 CLINICAL DATA:  Weakness. EXAM: PORTABLE CHEST 1 VIEW COMPARISON:  May 18, 2023. FINDINGS: Stable cardiomegaly. Left lung is clear. Mild right basilar atelectasis or infiltrate is noted with associated effusion. Bony thorax is unremarkable. IMPRESSION: Mild right basilar opacity as noted above. Electronically Signed   By: Rosalene Colon M.D.   On: 01/26/2024 15:24    Time coordinating discharge: 50 mins  SIGNED:  Unk Garb, DO [facility] 01/30/24, 8:36 AM

## 2024-01-30 NOTE — Assessment & Plan Note (Signed)
 01-27-2024 repleted with po kcl.

## 2024-01-30 NOTE — Discharge Instructions (Signed)
 Chronic indwelling Foley Catheter to be changed out every 30 days.  Next date of catheter change is Feb 24, 2024

## 2024-01-30 NOTE — Assessment & Plan Note (Signed)
 01-26-2024 treated with IVF. Scr returned to baseline. Likely due to UTI, occluded foley catheter.

## 2024-01-30 NOTE — Progress Notes (Signed)
 Attempt made 3 times with calling [facility] to give report as pt is being discharge. Unable to reach anyone.

## 2024-01-30 NOTE — Plan of Care (Signed)

## 2024-01-30 NOTE — Progress Notes (Signed)
 PROGRESS NOTE    Austin Weaver  ZOX:[id number] DOB: Feb 12, 1946 DOA: 01/26/2024 PCP: [place]  Subjective: Pt seen and examined. Tolerating zyvox . Ready for DC back to [facility]   Hospital Course: HPI: Austin Weaver is a 77 y.o. male with medical history significant for but not limited to history of TIA and CVA, recurrent falls, metastatic carcinoma of unknown primary site but has a T8 vertebral (unknown if primary site or metastasis) body mass with epidural spread causing severe spinal canal stenosis and compression of the spinal cord with resultant flaccid paraplegia and urinary retention, hypertension, hyperlipidemia who is currently on hospice who presents from his nursing facility for a single episode of coffee-ground emesis in the morning as well as mucopurulent discharge from his penis and cloudy urine.  Patient's facility tried to change his catheter but had difficulty if I was able to do so.  Given his coffee-ground emesis and purulent urine they sent him for further evaluation to the ED.  Patient is a poor historian himself and has a history of dementia and feels okay at this time.  The case was discussed with his legal guardian who wants further evaluation and workup and is okay with antibiotics, fluids as well as further investigation including EGD.  Patient was found to have purulent urine and was placed on the PPI drip and [facility] was asked to admit this patient for urinary tract infection and upon evaluation patient was found to be in severe sepsis given his lactic acid level being elevated so sepsis protocol initiated on the patient.   Significant Events: Admitted 01/26/2024 for UTI due to indwelling foley catheter   Significant Labs: VBG ph 7.65, PCO2 15 Lactic acid 3.6 WBC 17.9, HgB 11, Plt 340 Na 137, K 3.7, BUN 27, Scr 1.42, glu 106 Iron 19, TIB 165, %sat 12 Blood cx 01-26-2024 staph epi, staph lugdunensis, enterococcus faecalis  Significant Imaging Studies: CXR Mild  right basilar opacity as noted above  Renal U/S Limited study. The bladder and right kidney are not imaged at this time. Left kidney has some cysts as seen previously but is also incompletely evaluated. Repeat attempt can be performed when the patient is more clinically able.  Antibiotic Therapy: Anti-infectives (From admission, onward)    Start     Dose/Rate Route Frequency Ordered Stop   01/29/24 1000  vancomycin  (VANCOCIN ) IVPB 1000 mg/200 mL premix        1,000 mg 200 mL/hr over 60 Minutes Intravenous Every 48 hours 01/27/24 0741     01/27/24 1200  cefTRIAXone  (ROCEPHIN ) 1 g in sodium chloride  0.9 % 100 mL IVPB  Status:  Discontinued        1 g 200 mL/hr over 30 Minutes Intravenous Every 24 hours 01/26/24 1547 01/27/24 0741   01/27/24 0830  vancomycin  (VANCOREADY) IVPB 1500 mg/300 mL        1,500 mg 150 mL/hr over 120 Minutes Intravenous  Once 01/27/24 0741     01/26/24 1600  azithromycin  (ZITHROMAX ) 500 mg in sodium chloride  0.9 % 250 mL IVPB  Status:  Discontinued        500 mg 250 mL/hr over 60 Minutes Intravenous Every 24 hours 01/26/24 1547 01/27/24 0741   01/26/24 1230  cefTRIAXone  (ROCEPHIN ) 1 g in sodium chloride  0.9 % 100 mL IVPB        1 g 200 mL/hr over 30 Minutes Intravenous  Once 01/26/24 1217 01/26/24 1450       Procedures:   Consultants: ID  Assessment and Plan: * UTI (urinary tract infection) due to urinary indwelling Foley catheter (HCC) 01-27-2024 foley changed out in ER. On IV ABX for septicemia with enterococcus and 2 different staph species.  01-28-2024 continue with IV daptomycin  per ID consult  01-29-2024 ID changed abx over to po Zyvox .  01-30-2024 DC to SNF to complete 10 total days of abx.  Completed 1 day of IV daptomycin . Has had 2 days of in-house Zyvox . Will DC to SNF with 7 days of Zyvox .  Bacteremia due to Enterococcus 01-27-2024 foley changed out in ER. On IV ABX for septicemia with enterococcus and 2 different staph  species.  01-28-2024 continue with IV daptomycin  per ID consult.  01-29-2024 ID changed abx over to po Zyvox .  01-30-2024 DC to SNF to complete 10 total days of abx.  Completed 1 day of IV daptomycin . Has had 2 days of in-house Zyvox . Will DC to SNF with 7 days of Zyvox .  Bacteremia due to Staphylococcus 01-27-2024 foley changed out in ER. On IV ABX for septicemia with enterococcus and 2 different staph species. ID has ordered surface echo.  01-28-2024 continue with IV daptomycin  per ID consult. Awaiting echo.  01-29-2024 ID changed abx over to po Zyvox . Awaiting echo results today.  Repeat blood cx 01-28-2024 growing GPC from anaerobic bottle only.  01-30-2024 DC to SNF to complete 10 total days of abx.  Completed 1 day of IV daptomycin . Has had 2 days of in-house Zyvox . Will DC to SNF with 7 days of Zyvox .  Prostate cancer metastatic to bone (HCC) 01-27-2024 chronic.  01-28-2024 stable.  01-29-2024 stable.  01-30-2024 stable.  Hospice care patient 01-27-2024 pt is a hospice client.  01-28-2024 stable.  01-29-2024 stable.  01-30-2024 stable. Continue DNR.  Essential hypertension 01-27-2024 stable. Hold hydrochlorothiazide  for now. BP stable without HTN meds.  01-28-2024 stable. Restart low dose norvasc .  01-29-2024 stable. BP improved with restarting norvasc .  Still holding hydrochlorothiazide .  01-30-2024 stable.BP stable on norvasc  alone. Stop hydrochlorothiazide  at discharge.  Alzheimer's dementia with behavioral disturbance (HCC) 01-27-2024 chronic.  01-28-2024 stable.  01-29-2024 stable.  01-30-2024 stable.  Chronic indwelling Foley catheter 01-30-2024 continue with foley catheter. Will need it changed out monthly. Next change date is Feb 24, 2024  Pressure injury    WOC Nurse Consult Note: Reason for Consult: Requested to assess a PI stage 2 comes from the facility. Pt ts restrict to bed, not able to move by himself. Wound type: Pressure injury  stage 3. Pressure Injury POA: Yes Measurement: 12 x 6 cm x 0.1 cm. sacrum Wound bed: 100% red. Drainage (amount, consistency, odor) Moderate amount, no odor, serous. The dressing was saturated when I visited the pt at 1030. Periwound: intact. Dressing procedure/placement/frequency: Apply silver alginate on the wound bed, cover with sacral foam dressing, change every 3 days or PRN soiling or if the dressing is saturated.    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  01-30-2024 stable.   DVT prophylaxis: heparin  injection 5,000 Units Start: 01/26/24 2200 SCDs Start: 01/26/24 1859    Code Status: Limited: Do not attempt resuscitation (DNR) -DNR-LIMITED -Do Not Intubate/DNI  Family Communication: no family at bedside Disposition Plan: return to SNF Reason for continuing need for hospitalization: medically stable for DC  Objective: Vitals:   01/29/24 0835 01/29/24 1520 01/29/24 2053 01/30/24 0418  BP: 130/76 119/71 104/85 131/67  Pulse: 84 86 100 93  Resp:  19 19  Temp: 98.3 F (36.8 C) 99.1 F (37.3 C) 99.4 F (37.4 C) 99.1 F (37.3 C)  TempSrc:  Oral Oral Oral  SpO2: 98% 99% 98% 98%  Weight:      Height:        Intake/Output Summary (Last 24 hours) at 01/30/2024 0823 Last data filed at 01/30/2024 0507 Gross per 24 hour  Intake 480 ml  Output 200 ml  Net 280 ml   Filed Weights   01/26/24 1927  Weight: 68 kg    Examination:  Physical Exam Vitals reviewed.  Constitutional:      General: He is not in acute distress.    Appearance: He is not toxic-appearing or diaphoretic.  HENT:     Head: Normocephalic and atraumatic.  Eyes:     General: No scleral icterus. Cardiovascular:     Rate and Rhythm: Normal rate and regular rhythm.  Pulmonary:     Effort: Pulmonary effort is normal. No respiratory distress.     Breath sounds: No wheezing.  Abdominal:      General: Bowel sounds are normal.     Palpations: Abdomen is soft.  Genitourinary:    Comments: +foley Skin:    General: Skin is warm and dry.  Neurological:     Mental Status: He is alert.     Data Reviewed: I have personally reviewed following labs and imaging studies  CBC: Recent Labs  Lab 01/26/24 1337 01/26/24 2347 01/27/24 0619 01/27/24 1314 01/27/24 1753 01/27/24 2327 01/28/24 0647 01/29/24 0943  WBC 17.9*  --  12.6*  --   --   --   --  10.3  NEUTROABS 15.3*  --  9.2*  --   --   --   --  7.3  HGB 11.0*   < > 9.4* 9.4* 9.9* 9.5* 9.9* 10.7*  HCT 34.4*   < > 29.2* 29.1* 31.0* 29.8* 30.9* 33.0*  MCV 87.3  --  86.9  --   --   --   --  87.1  PLT 340  --  310  --   --   --   --  327   < > = values in this interval not displayed.   Basic Metabolic Panel: Recent Labs  Lab 01/26/24 1316 01/26/24 1337 01/27/24 0619  NA 138  138 137 137  K 4.1  4.2 3.7 3.1*  CL 110 104 105  CO2  --  22 20*  GLUCOSE 97 106* 90  BUN 31* 27* 17  CREATININE 1.50* 1.42* 0.86  CALCIUM   --  8.7* 8.2*   GFR: Estimated Creatinine Clearance: 65.9 mL/min (by C-G formula based on SCr of 0.86 mg/dL). Liver Function Tests: Recent Labs  Lab 01/26/24 1337 01/27/24 0619  AST 15 13*  ALT 10 8  ALKPHOS 58 45  BILITOT 0.8 0.8  PROT 7.6 6.4*  ALBUMIN 2.5* 2.2*   Coagulation Profile: Recent Labs  Lab 01/26/24 1958  INR 1.3*   Cardiac Enzymes: Recent Labs  Lab 01/28/24 0647  CKTOTAL 96   CBG: Recent Labs  Lab 01/26/24 1312  GLUCAP 110*   Sepsis Labs: Recent Labs  Lab 01/26/24 1316 01/26/24 1417 01/26/24 1514 01/26/24 1958  PROCALCITON  --  0.71  --   --   LATICACIDVEN 3.6*  --  1.1 1.3    Recent Results (from the past 240 hours)  Resp panel by RT-PCR (RSV, Flu A&B, Covid) Anterior Nasal Swab     Status: None   Collection Time:  01/26/24 12:18 PM   Specimen: Anterior Nasal Swab  Result Value Ref Range Status   SARS Coronavirus 2 by RT PCR NEGATIVE NEGATIVE Final    Influenza A by PCR NEGATIVE NEGATIVE Final   Influenza B by PCR NEGATIVE NEGATIVE Final    Comment: (NOTE) The Xpert Xpress SARS-CoV-2/FLU/RSV plus assay is intended as an aid in the diagnosis of influenza from Nasopharyngeal swab specimens and should not be used as a sole basis for treatment. Nasal washings and aspirates are unacceptable for Xpert Xpress SARS-CoV-2/FLU/RSV testing.  Fact Sheet for Patients: BloggerCourse.com  Fact Sheet for Healthcare Providers: SeriousBroker.it  This test is not yet approved or cleared by the [place]  FDA and has been authorized for detection and/or diagnosis of SARS-CoV-2 by FDA under an Emergency Use Authorization (EUA). This EUA will remain in effect (meaning this test can be used) for the duration of the COVID-19 declaration under Section 564(b)(1) of the Act, 21 U.S.C. section 360bbb-3(b)(1), unless the authorization is terminated or revoked.     Resp Syncytial Virus by PCR NEGATIVE NEGATIVE Final    Comment: (NOTE) Fact Sheet for Patients: BloggerCourse.com  Fact Sheet for Healthcare Providers: SeriousBroker.it  This test is not yet approved or cleared by the [place]  FDA and has been authorized for detection and/or diagnosis of SARS-CoV-2 by FDA under an Emergency Use Authorization (EUA). This EUA will remain in effect (meaning this test can be used) for the duration of the COVID-19 declaration under Section 564(b)(1) of the Act, 21 U.S.C. section 360bbb-3(b)(1), unless the authorization is terminated or revoked.  Performed at [facility], [address] [place], [place] [address]   Culture, blood (routine x 2)     Status: None (Preliminary result)   Collection Time: 01/26/24  1:37 PM   Specimen: BLOOD RIGHT HAND  Result Value Ref Range Status   Specimen Description BLOOD RIGHT HAND  Final   Special Requests    Final    BOTTLES DRAWN AEROBIC AND ANAEROBIC Blood Culture results may not be optimal due to an inadequate volume of blood received in culture bottles   Culture   Final    NO GROWTH 3 DAYS Performed at [facility], [address], [place], [place] [address]    Report Status PENDING  Incomplete  Culture, blood (routine x 2)     Status: Abnormal (Preliminary result)   Collection Time: 01/26/24  1:37 PM   Specimen: BLOOD  Result Value Ref Range Status   Specimen Description BLOOD THUMB  Final   Special Requests   Final    BOTTLES DRAWN AEROBIC AND ANAEROBIC Blood Culture results may not be optimal due to an inadequate volume of blood received in culture bottles   Culture  Setup Time   Final    GRAM POSITIVE COCCI IN BOTH AEROBIC AND ANAEROBIC BOTTLES CRITICAL RESULT CALLED TO, READ BACK BY AND VERIFIED WITH: PHARMD G ABBOTT 01/27/2024 @ 0725 BY AB    Culture (A)  Final    ENTEROCOCCUS FAECALIS STAPHYLOCOCCUS LUGDUNENSIS SUSCEPTIBILITIES TO FOLLOW STAPHYLOCOCCUS EPIDERMIDIS STAPHYLOCOCCUS HAEMOLYTICUS THE SIGNIFICANCE OF ISOLATING THIS ORGANISM FROM A SINGLE SET OF BLOOD CULTURES WHEN MULTIPLE SETS ARE DRAWN IS UNCERTAIN. PLEASE NOTIFY THE MICROBIOLOGY DEPARTMENT WITHIN ONE WEEK IF SPECIATION AND SENSITIVITIES ARE REQUIRED. Performed at [facility], [address], [place], [place] [address]    Report Status PENDING  Incomplete  Blood Culture ID Panel (Reflexed)     Status: Abnormal   Collection Time:  01/26/24  1:37 PM  Result Value Ref Range Status   Enterococcus faecalis DETECTED (A) NOT DETECTED Final    Comment: CRITICAL RESULT CALLED TO, READ BACK BY AND VERIFIED WITH: PHARMD G ABBOTT 01/27/2024 @ 0725 BY AB    Enterococcus Faecium NOT DETECTED NOT DETECTED Final   Listeria monocytogenes NOT DETECTED NOT DETECTED Final   Staphylococcus species DETECTED (A) NOT DETECTED Final    Comment: CRITICAL RESULT CALLED TO, READ BACK BY AND VERIFIED WITH: PHARMD G ABBOTT  01/27/2024 @ 0725 BY AB    Staphylococcus aureus (BCID) NOT DETECTED NOT DETECTED Final   Staphylococcus epidermidis DETECTED (A) NOT DETECTED Final    Comment: Methicillin (oxacillin) resistant coagulase negative staphylococcus. Possible blood culture contaminant (unless isolated from more than one blood culture draw or clinical case suggests pathogenicity). No antibiotic treatment is indicated for blood  culture contaminants. CRITICAL RESULT CALLED TO, READ BACK BY AND VERIFIED WITH: PHARMD G ABBOTT 01/27/2024 @ 0725 BY AB    Staphylococcus lugdunensis DETECTED (A) NOT DETECTED Final    Comment: Methicillin (oxacillin) resistant coagulase negative staphylococcus. Possible blood culture contaminant (unless isolated from more than one blood culture draw or clinical case suggests pathogenicity). No antibiotic treatment is indicated for blood  culture contaminants. CRITICAL RESULT CALLED TO, READ BACK BY AND VERIFIED WITH: PHARMD G ABBOTT 01/27/2024 @ 0725 BY AB    Streptococcus species NOT DETECTED NOT DETECTED Final   Streptococcus agalactiae NOT DETECTED NOT DETECTED Final   Streptococcus pneumoniae NOT DETECTED NOT DETECTED Final   Streptococcus pyogenes NOT DETECTED NOT DETECTED Final   A.calcoaceticus-baumannii NOT DETECTED NOT DETECTED Final   Bacteroides fragilis NOT DETECTED NOT DETECTED Final   Enterobacterales NOT DETECTED NOT DETECTED Final   Enterobacter cloacae complex NOT DETECTED NOT DETECTED Final   Escherichia coli NOT DETECTED NOT DETECTED Final   Klebsiella aerogenes NOT DETECTED NOT DETECTED Final   Klebsiella oxytoca NOT DETECTED NOT DETECTED Final   Klebsiella pneumoniae NOT DETECTED NOT DETECTED Final   Proteus species NOT DETECTED NOT DETECTED Final   Salmonella species NOT DETECTED NOT DETECTED Final   Serratia marcescens NOT DETECTED NOT DETECTED Final   Haemophilus influenzae NOT DETECTED NOT DETECTED Final   Neisseria meningitidis NOT DETECTED NOT DETECTED Final    Pseudomonas aeruginosa NOT DETECTED NOT DETECTED Final   Stenotrophomonas maltophilia NOT DETECTED NOT DETECTED Final   Candida albicans NOT DETECTED NOT DETECTED Final   Candida auris NOT DETECTED NOT DETECTED Final   Candida glabrata NOT DETECTED NOT DETECTED Final   Candida krusei NOT DETECTED NOT DETECTED Final   Candida parapsilosis NOT DETECTED NOT DETECTED Final   Candida tropicalis NOT DETECTED NOT DETECTED Final   Cryptococcus neoformans/gattii NOT DETECTED NOT DETECTED Final   Methicillin resistance mecA/C DETECTED (A) NOT DETECTED Final    Comment: CRITICAL RESULT CALLED TO, READ BACK BY AND VERIFIED WITH: PHARMD G ABBOTT 01/27/2024 @ 0725 BY AB    Vancomycin  resistance NOT DETECTED NOT DETECTED Final    Comment: Performed at [facility], [address], [place], [place] [address]  Remove and replace urinary cath (placed > 5 days) then obtain urine culture from new indwelling urinary catheter.     Status: Abnormal   Collection Time: 01/26/24  3:14 PM   Specimen: Urine, Catheterized  Result Value Ref Range Status   Specimen Description URINE, CATHETERIZED  Final   Special Requests   Final    NONE Performed at [facility], [address], [place], [place] [address]    Culture MULTIPLE SPECIES PRESENT, SUGGEST RECOLLECTION (A)  Final   Report Status 01/27/2024 FINAL  Final  Culture, blood (Routine X 2) w Reflex to ID Panel     Status: None (Preliminary result)   Collection Time: 01/28/24  6:47 AM   Specimen: BLOOD RIGHT HAND  Result Value Ref Range Status   Specimen Description BLOOD RIGHT HAND  Final   Special Requests   Final    BOTTLES DRAWN AEROBIC AND ANAEROBIC Blood Culture adequate volume   Culture   Final    NO GROWTH 1 DAY Performed at [facility], [address], [place], [place] [address]    Report Status PENDING  Incomplete  Culture, blood (Routine X 2) w Reflex to ID Panel     Status: None (Preliminary result)   Collection Time:  01/28/24  6:54 AM   Specimen: BLOOD RIGHT WRIST  Result Value Ref Range Status   Specimen Description BLOOD RIGHT WRIST  Final   Special Requests   Final    BOTTLES DRAWN AEROBIC AND ANAEROBIC Blood Culture adequate volume   Culture  Setup Time   Final    GRAM POSITIVE COCCI ANAEROBIC BOTTLE ONLY CRITICAL VALUE NOTED.  VALUE IS CONSISTENT WITH PREVIOUSLY REPORTED AND CALLED VALUE. Performed at [facility], [address] [place], [place] [address]    Culture [facility] POSITIVE COCCI  Final   Report Status PENDING  Incomplete     Radiology Studies: ECHOCARDIOGRAM COMPLETE Result Date: 01/29/2024    ECHOCARDIOGRAM REPORT   Patient Name:   Jacqueline Holbrook Date of Exam: 01/29/2024 Medical Rec #:  [id number]         Height:       66.0 in Accession #:    [id number]        Weight:       150.0 lb Date of Birth:  [birth year]         BSA:          1.770 m Patient Age:    78 years          BP:           134/67 mmHg Patient Gender: M                 HR:           84 bpm. Exam Location:  Inpatient Procedure: 2D Echo, Cardiac Doppler and Color Doppler (Both Spectral and Color            Flow Doppler were utilized during procedure). Indications:    Bacteremia  History:        Patient has prior history of Echocardiogram examinations, most                 recent 11/22/2017. Risk Factors:Dyslipidemia and Hypertension.  Sonographer:    Juanita Shaw Referring Phys: Beyonce Sawatzky IMPRESSIONS  1. Left ventricular ejection fraction, by estimation, is 65 to 70%. The left ventricle has normal function. The left ventricle has no regional wall motion abnormalities. There is mild concentric left ventricular hypertrophy. Left ventricular diastolic parameters are consistent with Grade I diastolic dysfunction (impaired relaxation).  2. Right ventricular systolic function is normal. The right ventricular size is normal.  3. The mitral valve is normal in structure. Trivial mitral valve regurgitation. No evidence of mitral stenosis.   4. The aortic valve is tricuspid. There is mild calcification of the aortic valve. Aortic valve regurgitation is not  visualized. Aortic valve sclerosis/calcification is present, without any evidence of aortic stenosis.  5. The inferior vena cava is normal in size with greater than 50% respiratory variability, suggesting right atrial pressure of 3 mmHg. Conclusion(s)/Recommendation(s): No evidence of valvular vegetations on this transthoracic echocardiogram. Consider a transesophageal echocardiogram to exclude infective endocarditis if clinically indicated. FINDINGS  Left Ventricle: Left ventricular ejection fraction, by estimation, is 65 to 70%. The left ventricle has normal function. The left ventricle has no regional wall motion abnormalities. The left ventricular internal cavity size was normal in size. There is  mild concentric left ventricular hypertrophy. Left ventricular diastolic parameters are consistent with Grade I diastolic dysfunction (impaired relaxation). Right Ventricle: The right ventricular size is normal. No increase in right ventricular wall thickness. Right ventricular systolic function is normal. Left Atrium: Left atrial size was normal in size. Right Atrium: Right atrial size was normal in size. Pericardium: There is no evidence of pericardial effusion. Mitral Valve: The mitral valve is normal in structure. Trivial mitral valve regurgitation. No evidence of mitral valve stenosis. MV peak gradient, 1.5 mmHg. The mean mitral valve gradient is 1.0 mmHg. Tricuspid Valve: The tricuspid valve is normal in structure. Tricuspid valve regurgitation is trivial. No evidence of tricuspid stenosis. Aortic Valve: The aortic valve is tricuspid. There is mild calcification of the aortic valve. Aortic valve regurgitation is not visualized. Aortic valve sclerosis/calcification is present, without any evidence of aortic stenosis. Aortic valve mean gradient measures 1.0 mmHg. Aortic valve peak gradient measures  2.7 mmHg. Aortic valve area, by VTI measures 3.14 cm. Pulmonic Valve: The pulmonic valve was normal in structure. Pulmonic valve regurgitation is not visualized. No evidence of pulmonic stenosis. Aorta: The aortic root is normal in size and structure. Venous: The inferior vena cava is normal in size with greater than 50% respiratory variability, suggesting right atrial pressure of 3 mmHg. IAS/Shunts: No atrial level shunt detected by color flow Doppler.  LEFT VENTRICLE PLAX 2D LVIDd:         4.40 cm      Diastology LVIDs:         2.70 cm      LV e' medial:    7.29 cm/s LV PW:         0.90 cm      LV E/e' medial:  6.7 LV IVS:        0.80 cm      LV e' lateral:   5.87 cm/s LVOT diam:     1.90 cm      LV E/e' lateral: 8.3 LV SV:         43 LV SV Index:   25 LVOT Area:     2.84 cm  LV Volumes (MOD) LV vol d, MOD A2C: 88.0 ml LV vol d, MOD A4C: 112.0 ml LV vol s, MOD A2C: 40.7 ml LV vol s, MOD A4C: 29.6 ml LV SV MOD A2C:     47.3 ml LV SV MOD A4C:     112.0 ml LV SV MOD BP:      65.9 ml RIGHT VENTRICLE             IVC RV Basal diam:  3.80 cm     IVC diam: 1.40 cm RV Mid diam:    2.80 cm RV S prime:     15.00 cm/s TAPSE (M-mode): 2.4 cm LEFT ATRIUM             Index        RIGHT  ATRIUM           Index LA diam:        3.00 cm 1.70 cm/m   RA Area:     11.70 cm LA Vol (A2C):   23.2 ml 13.11 ml/m  RA Volume:   26.10 ml  14.75 ml/m LA Vol (A4C):   21.6 ml 12.21 ml/m LA Biplane Vol: 23.8 ml 13.45 ml/m  AORTIC VALVE                    PULMONIC VALVE AV Area (Vmax):    2.75 cm     PV Vmax:       0.74 m/s AV Area (Vmean):   2.77 cm     PV Peak grad:  2.2 mmHg AV Area (VTI):     3.14 cm AV Vmax:           82.30 cm/s AV Vmean:          54.300 cm/s AV VTI:            0.138 m AV Peak Grad:      2.7 mmHg AV Mean Grad:      1.0 mmHg LVOT Vmax:         79.80 cm/s LVOT Vmean:        53.100 cm/s LVOT VTI:          0.153 m LVOT/AV VTI ratio: 1.11  AORTA Ao Root diam: 3.40 cm Ao Asc diam:  3.20 cm MITRAL VALVE MV Area (PHT): 2.87  cm    SHUNTS MV Area VTI:   3.04 cm    Systemic VTI:  0.15 m MV Peak grad:  1.5 mmHg    Systemic Diam: 1.90 cm MV Mean grad:  1.0 mmHg MV Vmax:       0.62 m/s MV Vmean:      48.4 cm/s MV Decel Time: 264 msec MV E velocity: 48.70 cm/s MV A velocity: 80.70 cm/s MV E/A ratio:  0.60 Jules Oar MD Electronically signed by Jules Oar MD Signature Date/Time: 01/29/2024/8:41:09 AM    Final     Scheduled Meds:  amLODipine   5 mg Oral Daily   atorvastatin   40 mg Oral QHS   Chlorhexidine  Gluconate Cloth  6 each Topical Daily   fluticasone   2 spray Each Nare Daily   heparin   5,000 Units Subcutaneous Q8H   linezolid   600 mg Oral Q12H   pantoprazole  (PROTONIX ) IV  40 mg Intravenous Q12H   Continuous Infusions:   LOS: 4 days   Time spent: 40 minutes  Unk Garb, DO  [facility]  01/30/2024, 8:23 AM

## 2024-01-30 NOTE — TOC Transition Note (Signed)
 Transition of Care [facility]) - Discharge Note   Patient Details  Name: Austin Weaver MRN: [id number] Date of Birth: 06-02-1946  [facility] CM/SW Contact:  Jeffory Mings, [place] Phone Number: 01/30/2024, 9:47 AM   Clinical Narrative:  Pt for dc back to [place] where he is a LTC resident followed by [facility] for hospice services. Spoke to Krystal in admissions who confirmed they are prepared to admit pt to room 105. Notified pt's legal guardian Roselee Cong of [facility] DSS who reports agreeable to dc. Misty with [facility] aware of dc. RN provided with number for report and ambulance transport arranged. SW signing off at dc.   Paullette Boston, MSW, LCSW [phone] (coverage)      Final next level of care: Skilled Nursing Facility Barriers to Discharge: Barriers Resolved   Patient Goals and CMS Choice            Discharge Placement              Patient chooses bed at: [facility] Patient to be transferred to facility by: non-emergency ambulance Name of family member notified: Brien Can of [facility] DSS Patient and family notified of of transfer: 01/30/24  Discharge Plan and Services Additional resources added to the After Visit Summary for                                       Social Drivers of Health (SDOH) Interventions SDOH Screenings   Food Insecurity: No Food Insecurity (01/26/2024)  Housing: Low Risk  (01/26/2024)  Transportation Needs: No Transportation Needs (01/26/2024)  Utilities: Not At Risk (01/26/2024)  Depression (PHQ2-9): Low Risk  (07/11/2022)  Financial Resource Strain: Medium Risk (12/01/2018)  Social Connections: Unknown (01/26/2024)  Tobacco Use: High Risk (01/26/2024)     Readmission Risk Interventions     No data to display

## 2024-01-31 LAB — CULTURE, BLOOD (ROUTINE X 2)
Culture: NO GROWTH
Special Requests: ADEQUATE

## 2024-02-02 LAB — CULTURE, BLOOD (ROUTINE X 2)
Culture: NO GROWTH
Special Requests: ADEQUATE
